# Patient Record
Sex: Female | Born: 1960 | Race: Black or African American | Hispanic: No | Marital: Single | Smoking: Current every day smoker
Health system: Southern US, Community
[De-identification: ages and names within clinical notes are randomized; demographics above are authoritative.]

## PROBLEM LIST (undated history)

## (undated) DIAGNOSIS — K635 Polyp of colon: Secondary | ICD-10-CM

## (undated) DIAGNOSIS — F32A Depression, unspecified: Secondary | ICD-10-CM

## (undated) DIAGNOSIS — R51 Headache: Secondary | ICD-10-CM

## (undated) DIAGNOSIS — N939 Abnormal uterine and vaginal bleeding, unspecified: Secondary | ICD-10-CM

## (undated) DIAGNOSIS — K226 Gastro-esophageal laceration-hemorrhage syndrome: Secondary | ICD-10-CM

## (undated) DIAGNOSIS — M1990 Unspecified osteoarthritis, unspecified site: Secondary | ICD-10-CM

## (undated) DIAGNOSIS — G894 Chronic pain syndrome: Secondary | ICD-10-CM

## (undated) DIAGNOSIS — K449 Diaphragmatic hernia without obstruction or gangrene: Secondary | ICD-10-CM

## (undated) DIAGNOSIS — K648 Other hemorrhoids: Secondary | ICD-10-CM

## (undated) DIAGNOSIS — D539 Nutritional anemia, unspecified: Secondary | ICD-10-CM

## (undated) DIAGNOSIS — K625 Hemorrhage of anus and rectum: Secondary | ICD-10-CM

## (undated) DIAGNOSIS — K5790 Diverticulosis of intestine, part unspecified, without perforation or abscess without bleeding: Secondary | ICD-10-CM

## (undated) DIAGNOSIS — E876 Hypokalemia: Secondary | ICD-10-CM

## (undated) DIAGNOSIS — F1020 Alcohol dependence, uncomplicated: Secondary | ICD-10-CM

## (undated) DIAGNOSIS — K279 Peptic ulcer, site unspecified, unspecified as acute or chronic, without hemorrhage or perforation: Secondary | ICD-10-CM

## (undated) DIAGNOSIS — K8020 Calculus of gallbladder without cholecystitis without obstruction: Secondary | ICD-10-CM

## (undated) DIAGNOSIS — R7989 Other specified abnormal findings of blood chemistry: Secondary | ICD-10-CM

## (undated) DIAGNOSIS — R945 Abnormal results of liver function studies: Secondary | ICD-10-CM

## (undated) DIAGNOSIS — F329 Major depressive disorder, single episode, unspecified: Secondary | ICD-10-CM

## (undated) HISTORY — DX: Calculus of gallbladder without cholecystitis without obstruction: K80.20

## (undated) HISTORY — PX: APPENDECTOMY: SHX54

## (undated) HISTORY — PX: OOPHORECTOMY: SHX86

## (undated) HISTORY — PX: TOTAL HIP ARTHROPLASTY: SHX124

## (undated) HISTORY — DX: Diaphragmatic hernia without obstruction or gangrene: K44.9

## (undated) HISTORY — DX: Polyp of colon: K63.5

## (undated) HISTORY — PX: SHOULDER SURGERY: SHX246

## (undated) HISTORY — DX: Gastro-esophageal laceration-hemorrhage syndrome: K22.6

## (undated) HISTORY — DX: Other specified abnormal findings of blood chemistry: R79.89

## (undated) HISTORY — DX: Chronic pain syndrome: G89.4

## (undated) HISTORY — DX: Alcohol dependence, uncomplicated: F10.20

## (undated) HISTORY — DX: Diverticulosis of intestine, part unspecified, without perforation or abscess without bleeding: K57.90

## (undated) HISTORY — DX: Peptic ulcer, site unspecified, unspecified as acute or chronic, without hemorrhage or perforation: K27.9

## (undated) HISTORY — PX: COLONOSCOPY: SHX174

## (undated) HISTORY — DX: Nutritional anemia, unspecified: D53.9

## (undated) HISTORY — DX: Other hemorrhoids: K64.8

## (undated) HISTORY — DX: Abnormal results of liver function studies: R94.5

## (undated) HISTORY — DX: Hypokalemia: E87.6

---

## 1998-01-16 ENCOUNTER — Emergency Department (HOSPITAL_COMMUNITY): Admission: EM | Admit: 1998-01-16 | Discharge: 1998-01-16 | Payer: Self-pay | Admitting: Family Medicine

## 1999-04-27 ENCOUNTER — Emergency Department (HOSPITAL_COMMUNITY): Admission: EM | Admit: 1999-04-27 | Discharge: 1999-04-27 | Payer: Self-pay | Admitting: *Deleted

## 1999-09-14 ENCOUNTER — Inpatient Hospital Stay (HOSPITAL_COMMUNITY): Admission: AD | Admit: 1999-09-14 | Discharge: 1999-09-14 | Payer: Self-pay | Admitting: Obstetrics

## 2001-04-27 ENCOUNTER — Inpatient Hospital Stay (HOSPITAL_COMMUNITY): Admission: AD | Admit: 2001-04-27 | Discharge: 2001-04-27 | Payer: Self-pay | Admitting: Obstetrics & Gynecology

## 2001-05-30 ENCOUNTER — Encounter: Admission: RE | Admit: 2001-05-30 | Discharge: 2001-05-30 | Payer: Self-pay | Admitting: Obstetrics & Gynecology

## 2002-09-14 ENCOUNTER — Encounter: Payer: Self-pay | Admitting: Emergency Medicine

## 2002-09-14 ENCOUNTER — Emergency Department (HOSPITAL_COMMUNITY): Admission: EM | Admit: 2002-09-14 | Discharge: 2002-09-14 | Payer: Self-pay | Admitting: Emergency Medicine

## 2002-10-09 ENCOUNTER — Ambulatory Visit (HOSPITAL_COMMUNITY): Admission: RE | Admit: 2002-10-09 | Discharge: 2002-10-09 | Payer: Self-pay | Admitting: Orthopedic Surgery

## 2004-08-18 ENCOUNTER — Emergency Department (HOSPITAL_COMMUNITY): Admission: EM | Admit: 2004-08-18 | Discharge: 2004-08-18 | Payer: Self-pay | Admitting: Emergency Medicine

## 2004-10-05 ENCOUNTER — Encounter: Admission: RE | Admit: 2004-10-05 | Discharge: 2004-10-05 | Payer: Self-pay | Admitting: Occupational Medicine

## 2005-08-31 ENCOUNTER — Emergency Department (HOSPITAL_COMMUNITY): Admission: EM | Admit: 2005-08-31 | Discharge: 2005-08-31 | Payer: Self-pay | Admitting: Emergency Medicine

## 2005-10-25 ENCOUNTER — Emergency Department (HOSPITAL_COMMUNITY): Admission: EM | Admit: 2005-10-25 | Discharge: 2005-10-25 | Payer: Self-pay | Admitting: Emergency Medicine

## 2005-10-27 ENCOUNTER — Ambulatory Visit (HOSPITAL_COMMUNITY): Admission: RE | Admit: 2005-10-27 | Discharge: 2005-10-27 | Payer: Self-pay | Admitting: *Deleted

## 2005-11-11 ENCOUNTER — Ambulatory Visit: Payer: Self-pay | Admitting: Family Medicine

## 2005-11-12 ENCOUNTER — Ambulatory Visit: Payer: Self-pay | Admitting: *Deleted

## 2005-12-07 ENCOUNTER — Encounter: Admission: RE | Admit: 2005-12-07 | Discharge: 2006-01-27 | Payer: Self-pay | Admitting: Orthopedic Surgery

## 2007-08-07 ENCOUNTER — Emergency Department (HOSPITAL_COMMUNITY): Admission: EM | Admit: 2007-08-07 | Discharge: 2007-08-07 | Payer: Self-pay | Admitting: Emergency Medicine

## 2007-10-12 ENCOUNTER — Encounter (INDEPENDENT_AMBULATORY_CARE_PROVIDER_SITE_OTHER): Payer: Self-pay | Admitting: Gynecology

## 2007-10-12 ENCOUNTER — Ambulatory Visit: Payer: Self-pay | Admitting: Gynecology

## 2007-10-20 ENCOUNTER — Ambulatory Visit (HOSPITAL_COMMUNITY): Admission: RE | Admit: 2007-10-20 | Discharge: 2007-10-20 | Payer: Self-pay | Admitting: Obstetrics & Gynecology

## 2007-11-02 ENCOUNTER — Ambulatory Visit: Payer: Self-pay | Admitting: Obstetrics and Gynecology

## 2008-02-13 ENCOUNTER — Ambulatory Visit (HOSPITAL_COMMUNITY): Admission: RE | Admit: 2008-02-13 | Discharge: 2008-02-13 | Payer: Self-pay | Admitting: Family Medicine

## 2008-04-12 ENCOUNTER — Ambulatory Visit (HOSPITAL_COMMUNITY): Admission: RE | Admit: 2008-04-12 | Discharge: 2008-04-12 | Payer: Self-pay | Admitting: Orthopaedic Surgery

## 2008-09-09 ENCOUNTER — Encounter: Admission: RE | Admit: 2008-09-09 | Discharge: 2008-10-17 | Payer: Self-pay | Admitting: Orthopaedic Surgery

## 2008-12-25 ENCOUNTER — Encounter: Admission: RE | Admit: 2008-12-25 | Discharge: 2009-02-12 | Payer: Self-pay | Admitting: Orthopaedic Surgery

## 2009-03-05 ENCOUNTER — Encounter: Admission: RE | Admit: 2009-03-05 | Discharge: 2009-06-03 | Payer: Self-pay | Admitting: Orthopaedic Surgery

## 2009-03-12 ENCOUNTER — Ambulatory Visit (HOSPITAL_COMMUNITY): Admission: RE | Admit: 2009-03-12 | Discharge: 2009-03-12 | Payer: Self-pay | Admitting: Obstetrics & Gynecology

## 2009-05-08 ENCOUNTER — Encounter: Admission: RE | Admit: 2009-05-08 | Discharge: 2009-05-09 | Payer: Self-pay | Admitting: Physician Assistant

## 2009-11-27 ENCOUNTER — Encounter: Admission: RE | Admit: 2009-11-27 | Discharge: 2009-11-27 | Payer: Self-pay | Admitting: Orthopaedic Surgery

## 2010-03-16 ENCOUNTER — Ambulatory Visit (HOSPITAL_COMMUNITY): Admission: RE | Admit: 2010-03-16 | Discharge: 2010-03-16 | Payer: Self-pay | Admitting: Obstetrics & Gynecology

## 2010-12-09 ENCOUNTER — Emergency Department (HOSPITAL_COMMUNITY)
Admission: EM | Admit: 2010-12-09 | Discharge: 2010-12-09 | Disposition: A | Discharge status: Home / Self Care | Payer: Self-pay | Attending: Emergency Medicine | Admitting: Emergency Medicine

## 2010-12-09 ENCOUNTER — Emergency Department (HOSPITAL_COMMUNITY): Payer: Self-pay

## 2010-12-09 DIAGNOSIS — M25519 Pain in unspecified shoulder: Secondary | ICD-10-CM | POA: Insufficient documentation

## 2010-12-09 DIAGNOSIS — W208XXA Other cause of strike by thrown, projected or falling object, initial encounter: Secondary | ICD-10-CM | POA: Insufficient documentation

## 2010-12-09 DIAGNOSIS — S60229A Contusion of unspecified hand, initial encounter: Secondary | ICD-10-CM | POA: Insufficient documentation

## 2010-12-09 DIAGNOSIS — Y93G2 Activity, grilling and smoking food: Secondary | ICD-10-CM | POA: Insufficient documentation

## 2010-12-09 DIAGNOSIS — M25539 Pain in unspecified wrist: Secondary | ICD-10-CM | POA: Insufficient documentation

## 2010-12-15 NOTE — Op Note (Signed)
NAMEWANITA, Jaime Phillips             ACCOUNT NO.:  [id number]   MEDICAL RECORD NO.:  [id number]          PATIENT TYPE:  AMB   LOCATION:  SDS                          FACILITY:  [facility]   PHYSICIAN:  Jaime Phillips. Magnus Ivan, M.D.DATE OF BIRTH:  05-Sep-1960   DATE OF PROCEDURE:  04/12/2008  DATE OF DISCHARGE:  04/12/2008                               OPERATIVE REPORT   PREOPERATIVE DIAGNOSES:  1. Left shoulder impingement syndrome.  2. 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   POSTOPERATIVE DIAGNOSES:  1. Left shoulder impingement syndrome.  2. 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   PROCEDURES:  1. Left shoulder arthroscopy with debridement.  2. Left shoulder arthroscopy with subacromial decompression.  3. Left shoulder open distal clavicle resection.   SURGEON:  Jaime Phillips. Magnus Ivan, MD   ANESTHESIA:  General.   BLOOD LOSS:  150 mL.   COMPLICATIONS:  None.   INDICATIONS:  Briefly, [name] is a 50 year old who sustained a  lateral clavicle fracture back in January of this year.  This was  unstable fracture and I recommend she undergo open reduction and  internal fixation.  The risks and benefits were explained to her in  length and she wished to proceed with nonoperative treatment.  It is  September now and she has had exquisite pain in her [facility] joint area and  prominence of the [facility] joint in the distal clavicle.  She wished to have  this area excised now.  I talked to her about the possibility of  reconstructing the coracoclavicular ligaments if need be depending on  the findings intraoperatively.   After the risks and benefits were understood, she agreed to proceed with  surgery.   PROCEDURE DESCRIPTION:  After informed consent was obtained, the  appropriate left shoulder was marked.  She was brought to  the operating  room and placed on the operating table.  General anesthesia was  obtained.  She was then fashioned to a beach-chair position with  appropriate positioning of the head, neck, and appropriate positioning  of the down nonoperative right arm.  There was forward bending of the  waist and knees.  Her left shoulder was prepped an draped with DuraPrep  and sterile drapes including sterile stockinette was placed in-line  skeletal traction with 45 degrees of forward flexion using the shoulder  traction device.  Time-out was called and she was identified as the  correct patient and correct extremity.  I then made a posterior portal  of the posterior and lateral edge of acromion and the glenohumeral joint  was encountered.  You could see that she had basically normal anatomy in  the glenohumeral joint itself with the rotator cuff, the biceps tendon,  and labrum all easily visible and had no changes whatsoever.  I then  proceeded with the subacromial decompression through the posterior  portal, I went into the subacromial space, I made a several lateral  portal, we could see there was significant tendinosis and bursitis  throughout the subacromial space.  We could see where the previous  lateral or distal clavicle fracture healed, but there was certainly much  near the [facility] joint.  Once I performed a complete bursectomy within the  subacromial space including the rotator cuff, I proceed with open  portion of distal clavicle resection.  The scope was removed from the  shoulder and then I made an incision directly over the lateral clavicle  that centered over the [facility] joint.  I then cleaned the soft tissue debris.  Using oscillating saw, I was able to excise it at least a centimeter of  the distal clavicle.  I then contoured the ends of this and there was no  sharp edge of bone and put the shoulder to motion and the CC ligaments  were intact and the clavicle did not warrant any further  stabilization.  I copiously irrigated the tissue then and closed the deep tissue and  oversewed this with 0 Vicryl suture followed by 2-0 Vicryl, subcutaneous  tissue, and interrupted 3-0 nylon on all skin incisions.  I then  infiltrated the incisions with 0.25% Sensorcaine with epinephrine and 4  mg of morphine.  The Xeroform followed by well-padded sterile dressing  was applied and the patient's arm was placed in shoulder immobilizer  sling.  She was awakened, extubated, and taken to recovery room in  stable condition.  All final counts were correct.  No complications  noted.      Jaime Phillips. Magnus Ivan, M.D.  Electronically Signed     CYB/MEDQ  D:  04/12/2008  T:  04/13/2008  Job:  [id number]

## 2010-12-15 NOTE — Group Therapy Note (Signed)
NAME:  Jaime Phillips, Jaime Phillips NO.:  [id number]   MEDICAL RECORD NO.:  [id number]          PATIENT TYPE:  WOC   LOCATION:  [facility]                   FACILITY:  [facility]   PHYSICIAN:  Ginger Carne, MD DATE OF BIRTH:  [birth year]   DATE OF SERVICE:                                  CLINIC NOTE   This patient is a 50 year old African American female referred by the  [facility] Department because of a 2-year history of  dysmenorrhea on the right lower quadrant.  She has been on Depo- Provera  150 mg q. 3 months for the past 2 years and every other month has 4 to 5  days of light-to-moderate flow accompanied by right lower quadrant pain  which radiates down her anterior thigh.  The patient indicates that in  the past she has had an appendectomy and an oophorectomy but is  uncertain which side.  She is also unclear as to the indications.  She  also does not know which year this was performed.  The patient has no  genitourinary sources for her discomfort.  Her gastrointestinal review  of systems is positive for blood in the stool, which she has had for  quite awhile.  She states her father has colon cancer.  The remainder of  her history is noted in the chart.   SALIENT PHYSICAL FINDINGS:  ABDOMEN:  Soft without gross  hepatosplenomegaly.  There is no tenderness on abdominal or pelvic  palpation.  PELVIC:  External genitalia, vulva and vagina normal.  Cervix smooth  without erosions or lesions.  Pap smear performed.  GC and chlamydia  obtained.  Uterus is small, anteverted and flexed.  Both adnexa are  palpable with slight tenderness in the right lower quadrant.  There is  no evidence of femoral direct or indirect inguinal hernia on the right  or left sides.   IMPRESSION:  1. Dysmenorrhea.  2. Blood in the stool.   PLAN:  I have ordered a pelvic sonogram to further delineate her pelvic  anatomy and any pathology that can be noted.  I have also referred her  to gastroenterology, where she will require a colonoscopy because of her  family history and blood in the stool.  I deferred a rectal exam as  this will be managed by the GI department.  I have also ordered a CBC  and a CEA as well.  She will return in 2 weeks following her ultrasound  results.  The patient is apprised of said care plan.           ______________________________  Ginger Carne, MD     SHB/MEDQ  D:  10/12/2007  T:  10/13/2007  Job:  [id number]

## 2010-12-15 NOTE — Group Therapy Note (Signed)
Jaime Phillips, STERNE NO.:  [id number]   MEDICAL RECORD NO.:  [id number]          PATIENT TYPE:  WOC   LOCATION:  [facility]                   FACILITY:  [facility]   PHYSICIAN:  Ginger Carne, MD DATE OF BIRTH:  Oct 11, 1960   DATE OF SERVICE:  11/02/2007                                  CLINIC NOTE   The patient returns today for follow-up results pertaining to her  ultrasound and a CBC. She is a 50 year old African American female who  has had a 2-year history of dysmenorrhea, dyspareunia and chronic pelvic  pain.  She has been on Depo Provera continuously 150 mg every 3 months.  She denies GI or GU sources or symptoms for her pain.  Based on her  ultrasound appears that she has had a left salpingo-oophorectomy and she  states by history she has had an appendectomy as well.  Remainder of her  pelvic ultrasound findings are within normal limits.  Specifically her  uterus appears normal and the right ovary appears normal.  Her CBC  demonstrates a hemoglobin of 14.3 and hematocrit of 44.1. Of concern is  the patient has bloody stool.   I have asked the patient to see a gastroenterologist for evaluation and  probable colonoscopy. She is agreeable to this and I think that from a  prioritization standpoint this needs to be taken care of first.  Afterwards I asked her husband and the patient to return to revisit  concerns pertaining to evaluation of her pelvic discomfort. I think that  she would be a candidate for an operative laparoscopy.  However, this  discussion will follow after she has been evaluated by gastroenterology.  They are satisfied with this approach.           ______________________________  Ginger Carne, MD     SHB/MEDQ  D:  11/02/2007  T:  11/02/2007  Job:  [id number]

## 2011-04-09 ENCOUNTER — Emergency Department (HOSPITAL_COMMUNITY)
Admission: EM | Admit: 2011-04-09 | Discharge: 2011-04-09 | Disposition: A | Discharge status: Home / Self Care | Payer: Self-pay | Attending: Emergency Medicine | Admitting: Emergency Medicine

## 2011-04-09 ENCOUNTER — Emergency Department (HOSPITAL_COMMUNITY): Payer: Self-pay

## 2011-04-09 DIAGNOSIS — W1789XA Other fall from one level to another, initial encounter: Secondary | ICD-10-CM | POA: Insufficient documentation

## 2011-04-09 DIAGNOSIS — Y92009 Unspecified place in unspecified non-institutional (private) residence as the place of occurrence of the external cause: Secondary | ICD-10-CM | POA: Insufficient documentation

## 2011-04-09 DIAGNOSIS — R079 Chest pain, unspecified: Secondary | ICD-10-CM | POA: Insufficient documentation

## 2011-04-09 DIAGNOSIS — F17200 Nicotine dependence, unspecified, uncomplicated: Secondary | ICD-10-CM | POA: Insufficient documentation

## 2011-04-09 DIAGNOSIS — S20219A Contusion of unspecified front wall of thorax, initial encounter: Secondary | ICD-10-CM | POA: Insufficient documentation

## 2011-05-05 LAB — CBC
HCT: 38.6
MCHC: 34
MCV: 103.7 — ABNORMAL HIGH
Platelets: 276

## 2011-06-04 ENCOUNTER — Other Ambulatory Visit (HOSPITAL_COMMUNITY): Payer: Self-pay | Admitting: Physician Assistant

## 2011-06-04 DIAGNOSIS — Z1231 Encounter for screening mammogram for malignant neoplasm of breast: Secondary | ICD-10-CM

## 2011-07-02 ENCOUNTER — Ambulatory Visit (HOSPITAL_COMMUNITY)
Admission: RE | Admit: 2011-07-02 | Discharge: 2011-07-02 | Disposition: A | Discharge status: Home / Self Care | Payer: Self-pay | Source: Ambulatory Visit | Attending: Physician Assistant | Admitting: Physician Assistant

## 2011-07-02 DIAGNOSIS — Z1231 Encounter for screening mammogram for malignant neoplasm of breast: Secondary | ICD-10-CM

## 2012-04-09 ENCOUNTER — Emergency Department (HOSPITAL_COMMUNITY)
Admission: EM | Admit: 2012-04-09 | Discharge: 2012-04-09 | Disposition: A | Discharge status: Home / Self Care | Payer: Self-pay | Attending: Emergency Medicine | Admitting: Emergency Medicine

## 2012-04-09 ENCOUNTER — Emergency Department (HOSPITAL_COMMUNITY): Payer: Self-pay

## 2012-04-09 ENCOUNTER — Encounter (HOSPITAL_COMMUNITY): Payer: Self-pay | Admitting: Emergency Medicine

## 2012-04-09 DIAGNOSIS — S8990XA Unspecified injury of unspecified lower leg, initial encounter: Secondary | ICD-10-CM | POA: Insufficient documentation

## 2012-04-09 DIAGNOSIS — S99922A Unspecified injury of left foot, initial encounter: Secondary | ICD-10-CM

## 2012-04-09 DIAGNOSIS — X500XXA Overexertion from strenuous movement or load, initial encounter: Secondary | ICD-10-CM | POA: Insufficient documentation

## 2012-04-09 DIAGNOSIS — Y93H9 Activity, other involving exterior property and land maintenance, building and construction: Secondary | ICD-10-CM | POA: Insufficient documentation

## 2012-04-09 DIAGNOSIS — Y92009 Unspecified place in unspecified non-institutional (private) residence as the place of occurrence of the external cause: Secondary | ICD-10-CM | POA: Insufficient documentation

## 2012-04-09 MED ORDER — OXYCODONE-ACETAMINOPHEN 5-325 MG PO TABS: 1.0000 | ORAL_TABLET | Freq: Four times a day (QID) | ORAL | Status: AC | PRN

## 2012-04-09 MED ORDER — OXYCODONE-ACETAMINOPHEN 5-325 MG PO TABS
2.0000 | ORAL_TABLET | Freq: Once | ORAL | Status: DC
  Filled 2012-04-09: qty 2

## 2012-04-09 NOTE — ED Notes (Signed)
Pt states that yesterday she fell and hurt or twisted her rt ankle, has pain and minimal swelling to foot, minimal wt bearing to it strong pedal pulses.

## 2012-04-09 NOTE — ED Provider Notes (Signed)
History     CSN: [id number]  Arrival date & time 04/09/12  1322   First MD Initiated Contact with Patient 04/09/12 1543      Chief Complaint  Patient presents with  . Foot Pain    (Consider location/radiation/quality/duration/timing/severity/associated sxs/prior treatment) HPI Comments: Jaime Phillips 51 y.o. female   The chief complaint is: Patient presents with:   Foot Pain   The patient has medical history significant for:   History reviewed. No pertinent past medical history.  Patient presents with a left foot injury that occurred yesterday while cutting the grass. She states she twisted her ankle causing 9/10 pain. Patient states that she has swelling of her left foot and toes. Denies fever or chills. Patient states she has taken tylenol every 6 hours without relief. Denies NVD or abdominal pain. Denies numbness. Reports some difficulty ambulating. History is significant for fracturing the left ankle in 2000.     The history is provided by the patient.    History reviewed. No pertinent past medical history.  History reviewed. No pertinent past surgical history.  No family history on file.  History  Substance Use Topics  . Smoking status: Not on file  . Smokeless tobacco: Not on file  . Alcohol Use: Not on file    OB History    Grav Para Term Preterm Abortions TAB SAB Ect Mult Living                  Review of Systems  Constitutional: Negative for fever and chills.  Gastrointestinal: Negative for nausea, vomiting, abdominal pain and diarrhea.  Musculoskeletal: Positive for gait problem.  Neurological: Negative for numbness.  All other systems reviewed and are negative.    Allergies  Review of patient's allergies indicates not on file.  Home Medications  No current outpatient prescriptions on file.  BP 125/78  Pulse 88  Temp 97.8 F (36.6 C) (Oral)  SpO2 100%  Physical Exam  Nursing note and vitals reviewed. Constitutional: She appears  well-developed and well-nourished. She appears distressed.  HENT:  Head: Normocephalic and atraumatic.  Eyes: Conjunctivae and EOM are normal. No scleral icterus.  Neck: Normal range of motion. Neck supple.  Cardiovascular: Normal rate and regular rhythm.   Pulmonary/Chest: Effort normal and breath sounds normal.  Abdominal: Soft. Bowel sounds are normal. There is no tenderness.  Musculoskeletal:       Left foot: She exhibits tenderness, bony tenderness and swelling. She exhibits normal range of motion and normal capillary refill.       Feet:  Neurological: She is alert.  Skin: Skin is warm.    ED Course  Procedures (including critical care time)  Labs Reviewed - No data to display Dg Foot Complete Left  04/09/2012  *RADIOLOGY REPORT*  Clinical Data: Foot pain.  Pain third through fifth MTP joints.  LEFT FOOT - COMPLETE 3+ VIEW  Comparison: None.  Findings: The joints of the foot are aligned.  No acute or healing fracture is identified. There is slight soft tissue swelling of the dorsum of the forefoot.  Plantar calcaneal spur.  IMPRESSION:  1.  No acute bony abnormality visualized. 2.  Mild soft tissue swelling over the dorsum of the proximal foot.   Original Report Authenticated By: Britta Mccreedy, M.D.      1. Injury of left foot       MDM  Patient presents s/p left foot/ankle injury that occurred yesterday. Patient given pain medication with improvement. Imaging: Remarkable for soft tissue  swelling but no fracture.  Patient placed in aso splint with recommendations for rest and ice. Patient discharged on pain medication. No red flags for fracture, subluxation or dislocation. Return precautions given.        Pixie Casino, PA-C 04/10/12 0209

## 2012-04-09 NOTE — Discharge Instructions (Signed)
If you notice increased swelling, redness of your foot, or develop a fever, return to the ER Take medication as written Look at instructions about rest and ice

## 2012-04-10 NOTE — ED Provider Notes (Signed)
Medical screening examination/treatment/procedure(s) were performed by non-physician practitioner and as supervising physician I was immediately available for consultation/collaboration.   Glynn Octave, MD 04/10/12 [phone]

## 2012-07-19 ENCOUNTER — Inpatient Hospital Stay (HOSPITAL_COMMUNITY)
Admission: AD | Admit: 2012-07-19 | Discharge: 2012-07-19 | Disposition: A | Discharge status: Home / Self Care | Payer: Self-pay | Source: Ambulatory Visit | Attending: Obstetrics & Gynecology | Admitting: Obstetrics & Gynecology

## 2012-07-19 ENCOUNTER — Encounter (HOSPITAL_COMMUNITY): Payer: Self-pay

## 2012-07-19 DIAGNOSIS — K625 Hemorrhage of anus and rectum: Secondary | ICD-10-CM | POA: Insufficient documentation

## 2012-07-19 DIAGNOSIS — M79609 Pain in unspecified limb: Secondary | ICD-10-CM | POA: Insufficient documentation

## 2012-07-19 DIAGNOSIS — K089 Disorder of teeth and supporting structures, unspecified: Secondary | ICD-10-CM | POA: Insufficient documentation

## 2012-07-19 HISTORY — DX: Abnormal uterine and vaginal bleeding, unspecified: N93.9

## 2012-07-19 LAB — COMPREHENSIVE METABOLIC PANEL
ALT: 16 U/L (ref 0–35)
AST: 21 U/L (ref 0–37)
Alkaline Phosphatase: 60 U/L (ref 39–117)
CO2: 24 mEq/L (ref 19–32)
Calcium: 9 mg/dL (ref 8.4–10.5)
GFR calc Af Amer: >90 mL/min (ref >90)
GFR calc non Af Amer: 79 mL/min — ABNORMAL LOW (ref >90)
Glucose, Bld: 105 mg/dL — ABNORMAL HIGH (ref 70–99)
Potassium: 4.1 mEq/L (ref 3.5–5.1)
Sodium: 136 mEq/L (ref 135–145)
Total Protein: 6.7 g/dL (ref 6.0–8.3)

## 2012-07-19 LAB — CBC
Hemoglobin: 11.9 g/dL — ABNORMAL LOW (ref 12.0–15.0)
Platelets: 269 10*3/uL (ref 150–400)
RBC: 3.46 MIL/uL — ABNORMAL LOW (ref 3.87–5.11)

## 2012-07-19 MED ORDER — OMEPRAZOLE 20 MG PO CPDR: 20.0000 mg | DELAYED_RELEASE_CAPSULE | Freq: Every day | ORAL | Status: DC

## 2012-07-19 MED ORDER — HYDROCODONE-ACETAMINOPHEN 5-325 MG PO TABS: 1.0000 | ORAL_TABLET | Freq: Four times a day (QID) | ORAL | Status: DC | PRN

## 2012-07-19 MED ORDER — OXYCODONE-ACETAMINOPHEN 5-325 MG PO TABS: 1.0000 | ORAL_TABLET | Freq: Once | ORAL | Status: DC

## 2012-07-19 MED ORDER — FAMOTIDINE 20 MG PO TABS
ORAL_TABLET | ORAL | Status: AC
  Filled 2012-07-19: qty 1

## 2012-07-19 MED ORDER — FAMOTIDINE 20 MG PO TABS
20.0000 mg | ORAL_TABLET | Freq: Once | ORAL | Status: AC
  Administered 2012-07-19: 20 mg via ORAL

## 2012-07-19 NOTE — MAU Note (Signed)
Patient is in with c/o rectal bleeding for 2 weeks ago, that have small amount of clots today. Patient also c/o bilateral leg pain and toothache. Patient is taking multi NSAIDS Q3hours, aspirin and tylenol. She denies feeling dizzy.

## 2012-07-19 NOTE — MAU Provider Note (Signed)
History     CSN: [id number]  Arrival date and time: 07/19/12 1605   First Provider Initiated Contact with Patient 07/19/12 1646      Chief Complaint  Patient presents with  . Rectal Bleeding  . Leg Pain  . Dental Pain   HPI  Jaime Phillips is a 51 y.o. female who presents to MAU with a complaint of rectal bleeding, leg pain and dental pain.   The patient's leg pain started about 2-3 months ago and comes and goes throughout the day. She states that it is more severe when she is laying down and keeps her from sleeping. The pain starts in her lower back and radiates down her legs bilaterally, although not always both legs at the same time. The patient has been taking tylenol, motrin, Advil and tylenol PM for her pain.   The patient's rectal bleeding started approximately 2 weeks ago. She came in today because she is noticing clots in her stool. She has issues with constipation often. The blood in her stool is mostly dark red. She states her last colonoscopy was about 6 years ago. After further discussion the patient states that she was diagnosed with diverticulitis at that time and has seen numerous GI specialists in the past.   The patient was seen at [facility] yesterday for her Depo provera. The patient states that she had issues with vaginal bleeding but since starting the depo she has only had spotting. The depo was given "in her stomach" and she has redness at the injection site. She states that they used to give it to her in her hip and for the last 2 injections they have given it to her in her abdomen.    OB History    Grav Para Term Preterm Abortions TAB SAB Ect Mult Living                  Past Medical History  Diagnosis Date  . Abnormal uterine bleeding     Past Surgical History  Procedure Date  . Appendectomy   . Oophorectomy   . Shoulder surgery     left shoulder    History reviewed. No pertinent family history.  History  Substance Use Topics  . Smoking status:  Current Every Day Smoker    Types: Cigarettes  . Smokeless tobacco: Not on file  . Alcohol Use: 1.2 oz/week    2 Shots of liquor per week    Allergies:  Allergies  Allergen Reactions  . Codeine Other (See Comments)    hallucinating    No prescriptions prior to admission    ROS  Negative unless otherwise stated in HPI  Physical Exam   Blood pressure 118/86, pulse 94, temperature 98.6 F (37 C), temperature source Oral, resp. rate 18, SpO2 100.00%.  Physical Exam  Constitutional: She is oriented to person, place, and time. She appears well-developed and well-nourished. No distress.  HENT:  Head: Normocephalic and atraumatic.  Cardiovascular: Normal rate.   Respiratory: Effort normal.  GI: Soft. Bowel sounds are normal. She exhibits no distension and no mass. There is tenderness (Mild tenderness of the LLQ with palpation). There is no rebound and no guarding.  Genitourinary: Rectal exam shows no external hemorrhoid, no internal hemorrhoid, no fissure, no mass, no tenderness and anal tone normal. Guaiac positive stool.  Neurological: She is alert and oriented to person, place, and time.  Skin: Skin is warm and dry. No erythema.  Psychiatric: She has a normal mood and affect.  MAU Course  Procedures  MDM Spoke with Dr. Despina Hidden. Patient is hemodynamically stable at this time.  GI referral outpatient Rx for Prilosec #30 Rx Lortab #10   Assessment and Plan  A: Rectal bleeding; most likely lower GI source Leg pain Tooth pain  P: Discharge patient Outpatient GI referral sent. Patient instructed to call office to schedule if she has not been notified of appointment in the next 2-3 days Rx for Prilosec sent to pharmacy; Rx for Lortab given Patient instructed to stop taking all other pain medication until evaluated by the GI MD Patient instructed to limit alcohol consumption as this could be contributing to her symptoms Patient instructed to find a PCP and dentist to  evaluate her other pain Patient instructed to go to the ED if her condition were to change or worsen.  Patient voices understanding of recommendations and has no further questions or concerns at this time.  ETHIER, JULIE N. 07/19/2012, 7:41 PM

## 2012-07-27 ENCOUNTER — Other Ambulatory Visit (HOSPITAL_COMMUNITY): Payer: Self-pay | Admitting: Physician Assistant

## 2012-07-27 ENCOUNTER — Telehealth: Payer: Self-pay | Admitting: *Deleted

## 2012-07-27 DIAGNOSIS — Z1231 Encounter for screening mammogram for malignant neoplasm of breast: Secondary | ICD-10-CM

## 2012-07-27 NOTE — Telephone Encounter (Signed)
Pt reports rectal bleeding for 2-3 weeks. She went to the ER on 07/19/12 for rectal blleding and leg and teeth pain. She was told to f/u with GI in a week since hemodynamically stable.  Pt reports she has seen GI docs in the past, knows it's [facility], but does not know the doc there. She called here to see where she could get the earliest appt. She states she's wearing a pad d/t the clots after a BM, but states she bleeds in between the BMs. I called [facility] and she was last seen by Dr Evette Cristal in May 2009 and is inactive d/t an outstanding balance. Informed pt she will need to bring her records by here for md review for her 08/17/11 appt. If she needs to be seen sooner, she must bring the records by here. Pt stated understanding.

## 2012-07-28 ENCOUNTER — Encounter (HOSPITAL_COMMUNITY): Payer: Self-pay | Admitting: Emergency Medicine

## 2012-07-28 ENCOUNTER — Emergency Department (HOSPITAL_COMMUNITY)
Admission: EM | Admit: 2012-07-28 | Discharge: 2012-07-28 | Disposition: A | Discharge status: Home / Self Care | Payer: Self-pay | Attending: Emergency Medicine | Admitting: Emergency Medicine

## 2012-07-28 ENCOUNTER — Emergency Department (HOSPITAL_COMMUNITY): Payer: Self-pay

## 2012-07-28 DIAGNOSIS — K625 Hemorrhage of anus and rectum: Secondary | ICD-10-CM | POA: Insufficient documentation

## 2012-07-28 DIAGNOSIS — M549 Dorsalgia, unspecified: Secondary | ICD-10-CM | POA: Insufficient documentation

## 2012-07-28 DIAGNOSIS — R1032 Left lower quadrant pain: Secondary | ICD-10-CM | POA: Insufficient documentation

## 2012-07-28 DIAGNOSIS — R109 Unspecified abdominal pain: Secondary | ICD-10-CM

## 2012-07-28 DIAGNOSIS — E872 Acidosis: Secondary | ICD-10-CM

## 2012-07-28 DIAGNOSIS — Z79899 Other long term (current) drug therapy: Secondary | ICD-10-CM | POA: Insufficient documentation

## 2012-07-28 DIAGNOSIS — F17200 Nicotine dependence, unspecified, uncomplicated: Secondary | ICD-10-CM | POA: Insufficient documentation

## 2012-07-28 LAB — BASIC METABOLIC PANEL
BUN: 8 mg/dL (ref 6–23)
Calcium: 9 mg/dL (ref 8.4–10.5)
GFR calc Af Amer: >90 mL/min (ref >90)
GFR calc non Af Amer: >90 mL/min (ref >90)
Glucose, Bld: 106 mg/dL — ABNORMAL HIGH (ref 70–99)
Potassium: 3.8 mEq/L (ref 3.5–5.1)

## 2012-07-28 LAB — CBC
HCT: 32.9 % — ABNORMAL LOW (ref 36.0–46.0)
Hemoglobin: 11.3 g/dL — ABNORMAL LOW (ref 12.0–15.0)
MCH: 35.5 pg — ABNORMAL HIGH (ref 26.0–34.0)
MCHC: 34.3 g/dL (ref 30.0–36.0)
RDW: 12.5 % (ref 11.5–15.5)

## 2012-07-28 MED ORDER — OXYCODONE-ACETAMINOPHEN 5-325 MG PO TABS: 1.0000 | ORAL_TABLET | ORAL | Status: DC | PRN

## 2012-07-28 MED ORDER — SODIUM CHLORIDE 0.9 % IV SOLN
1000.0000 mL | INTRAVENOUS | Status: DC
  Administered 2012-07-28: 1000 mL via INTRAVENOUS

## 2012-07-28 MED ORDER — MORPHINE SULFATE 4 MG/ML IJ SOLN
6.0000 mg | Freq: Once | INTRAMUSCULAR | Status: AC
  Administered 2012-07-28: 6 mg via INTRAVENOUS
  Filled 2012-07-28: qty 2

## 2012-07-28 MED ORDER — IOHEXOL 300 MG/ML  SOLN
100.0000 mL | Freq: Once | INTRAMUSCULAR | Status: AC | PRN
  Administered 2012-07-28: 100 mL via INTRAVENOUS

## 2012-07-28 MED ORDER — SODIUM CHLORIDE 0.9 % IV SOLN
1000.0000 mL | Freq: Once | INTRAVENOUS | Status: AC
  Administered 2012-07-28: 1000 mL via INTRAVENOUS

## 2012-07-28 NOTE — ED Notes (Signed)
Off floor for testing 

## 2012-07-28 NOTE — ED Provider Notes (Signed)
History     CSN: [id number]  Arrival date & time 07/28/12  1237   First MD Initiated Contact with Patient 07/28/12 1329      Chief Complaint  Patient presents with  . Rectal Bleeding  . Back Pain     The history is provided by the patient.   patient reports worsening left lower quadrant abdominal pain over the past 6-7 days with associated rectal bleeding.  She reports she is rectal bleeding every time she has a bowel movement.  She has no rectal bleeding in between bowel movements.  She does have some pain with defecation.  No fevers or chills.  She does have a history of diverticulosis and diverticulitis.  She has followup with a gastroenterologist but is not until mid January and she states that her symptoms continue and it is bothering her.  Her symptoms are mild to moderate in severity.  Nothing worsens or improves her symptoms.  No lightheadedness or weakness.  No chest pain or shortness of breath.  Past Medical History  Diagnosis Date  . Abnormal uterine bleeding     Past Surgical History  Procedure Date  . Appendectomy   . Oophorectomy   . Shoulder surgery     left shoulder    No family history on file.  History  Substance Use Topics  . Smoking status: Current Every Day Smoker    Types: Cigarettes  . Smokeless tobacco: Not on file  . Alcohol Use: 1.2 oz/week    2 Shots of liquor per week    OB History    Grav Para Term Preterm Abortions TAB SAB Ect Mult Living                  Review of Systems  Gastrointestinal: Positive for hematochezia.  Musculoskeletal: Positive for back pain.  All other systems reviewed and are negative.    Allergies  Codeine  Home Medications   Current Outpatient Rx  Name  Route  Sig  Dispense  Refill  . ACETAMINOPHEN 500 MG PO TABS   Oral   Take 500 mg by mouth every 6 (six) hours as needed. For pain.         Drema Balzarine HCL 200-25 MG PO CAPS   Oral   Take 2 tablets by mouth every 8 (eight) hours as  needed. For pain.         Marland Kitchen OMEPRAZOLE 20 MG PO CPDR   Oral   Take 1 capsule (20 mg total) by mouth daily.   30 capsule   0     BP 116/80  Pulse 82  Temp 98.3 F (36.8 C) (Oral)  Resp 18  SpO2 100%  Physical Exam  Nursing note and vitals reviewed. Constitutional: She is oriented to person, place, and time. She appears well-developed and well-nourished. No distress.  HENT:  Head: Normocephalic and atraumatic.  Eyes: EOM are normal.  Neck: Normal range of motion.  Cardiovascular: Normal rate, regular rhythm and normal heart sounds.   Pulmonary/Chest: Effort normal and breath sounds normal.  Abdominal: Soft. She exhibits no distension.       Mild tenderness in left lower quadrant  Musculoskeletal: Normal range of motion.  Neurological: She is alert and oriented to person, place, and time.  Skin: Skin is warm and dry.  Psychiatric: She has a normal mood and affect. Judgment normal.    ED Course  Procedures (including critical care time)  Labs Reviewed  CBC - Abnormal; Notable for the following:  RBC 3.18 (*)     Hemoglobin 11.3 (*)     HCT 32.9 (*)     MCV 103.5 (*)     MCH 35.5 (*)     All other components within normal limits  BASIC METABOLIC PANEL - Abnormal; Notable for the following:    Glucose, Bld 106 (*)     All other components within normal limits  TYPE AND SCREEN  ABO/RH   Ct Abdomen Pelvis W Contrast  07/28/2012  *RADIOLOGY REPORT*  Clinical Data:   Rectal bleeding  CT ABDOMEN AND PELVIS WITH CONTRAST  Technique:  Multidetector CT imaging of the abdomen and pelvis was performed following the standard protocol during bolus administration of intravenous contrast.  Contrast: OMNIPAQUE IOHEXOL 300 MG/ML  SOLN  Comparison: None.  Findings: Images which include the lung bases are unremarkable.  The liver, spleen, stomach, duodenum, pancreas, and gallbladder are unremarkable.  No evidence for adrenal mass.  Kidneys have normal imaging features.  Mild  circumferential wall thickening is noted in the distal esophagus (see image seven of series 2).  This is probably related to reflux esophagitis, but neoplasm could have this appearance.  No abdominal aortic aneurysm.  No free fluid or lymphadenopathy in the abdomen.  The abdominal bowel loops have normal imaging features.  Images through the pelvis show no intraperitoneal free fluid. There is no pelvic sidewall lymphadenopathy.  Bladder is unremarkable.  Uterus is normal.  No adnexal mass.  Diverticular disease is seen in the left colon without evidence for diverticulitis the terminal ileum is normal. Nonvisualization of the appendix is consistent with the reported history of appendectomy.  2 x 2 x 3 cm calcification in the proximal left iliopsoas complex, anterior to the hip, may be related to chronic bursal inflammation. This could also be related to an old hematoma or muscular injury. Bone windows reveal no worrisome lytic or sclerotic osseous lesions.  IMPRESSION: No acute findings in the abdomen or pelvis.  Specifically, no changes to explain the patient's reported history of rectal bleeding.  Diverticular disease noted in the left colon without diverticulitis.  Colonoscopy may be indicated and correlation with colorectal cancer screening history recommended.   Original Report Authenticated By: Kennith Center, M.D.    I personally reviewed the imaging tests through PACS system I reviewed available ER/hospitalization records through the EMR    1. Rectal bleeding   2. Abdominal pain       MDM  5:10 PM The patient feels much better at this time.  She has a very small amount of blood on gloved rectal exam.  She has no evidence of external hemorrhoids.  I suspect her bleeding given that it only occurs with bowel movements is likely a small internal hemorrhoid.  No obvious palpable hemorrhoid noted on exam.  CT scan without acute pathology..  She does have a history of diverticulitis and diverticulosis.   Her bleeding has been going on for 2-3 weeks and her hemoglobin and vital signs are stable.  This does not appear to be a significant GI bleed.  She are ready has followup appointment with gastroenterology.  She understands return to the ER for new or worsening symptoms        Lyanne Co, MD 07/28/12 [phone]

## 2012-07-28 NOTE — ED Notes (Signed)
Pt c/o rectal bleeding and back pain that "goes down to her knees" x 3 weeks.  Denies NVD.  States that there are clots when she has a BM.  VSS.  A&O x4.

## 2012-08-14 ENCOUNTER — Ambulatory Visit (HOSPITAL_COMMUNITY): Payer: Medicaid Other | Attending: Physician Assistant

## 2012-08-14 ENCOUNTER — Encounter: Payer: Self-pay | Admitting: Internal Medicine

## 2012-08-16 ENCOUNTER — Encounter: Payer: Self-pay | Admitting: Internal Medicine

## 2012-08-16 ENCOUNTER — Other Ambulatory Visit (INDEPENDENT_AMBULATORY_CARE_PROVIDER_SITE_OTHER): Payer: Self-pay

## 2012-08-16 ENCOUNTER — Ambulatory Visit (INDEPENDENT_AMBULATORY_CARE_PROVIDER_SITE_OTHER): Payer: Self-pay | Admitting: Internal Medicine

## 2012-08-16 VITALS — BP 130/60 | HR 70 | Ht 63.0 in | Wt 143.4 lb

## 2012-08-16 DIAGNOSIS — R109 Unspecified abdominal pain: Secondary | ICD-10-CM

## 2012-08-16 DIAGNOSIS — R1030 Lower abdominal pain, unspecified: Secondary | ICD-10-CM

## 2012-08-16 DIAGNOSIS — Z1211 Encounter for screening for malignant neoplasm of colon: Secondary | ICD-10-CM

## 2012-08-16 DIAGNOSIS — K625 Hemorrhage of anus and rectum: Secondary | ICD-10-CM

## 2012-08-16 DIAGNOSIS — D649 Anemia, unspecified: Secondary | ICD-10-CM

## 2012-08-16 LAB — CBC
HCT: 37.6 % (ref 36.0–46.0)
Hemoglobin: 12.6 g/dL (ref 12.0–15.0)
MCHC: 33.5 g/dL (ref 30.0–36.0)
MCV: 102.1 fl — ABNORMAL HIGH (ref 78.0–100.0)
Platelets: 298 10*3/uL (ref 150.0–400.0)
RBC: 3.68 Mil/uL — ABNORMAL LOW (ref 3.87–5.11)

## 2012-08-16 LAB — VITAMIN B12: Vitamin B-12: 379 pg/mL (ref 211–911)

## 2012-08-16 MED ORDER — TRAMADOL HCL 50 MG PO TABS: 50.0000 mg | ORAL_TABLET | Freq: Four times a day (QID) | ORAL | Status: DC | PRN

## 2012-08-16 MED ORDER — PEG-KCL-NACL-NASULF-NA ASC-C 100 G PO SOLR: 1.0000 | Freq: Once | ORAL | Status: DC

## 2012-08-16 NOTE — Progress Notes (Signed)
Patient ID: Jaime Phillips, female   DOB: 1961/05/13, 52 y.o.   MRN: [id number]  SUBJECTIVE: HPI Jaime Phillips is a 52 year old female who is seen for evaluation of lower bowel pain and rectal bleeding. The patient states that she has developed bright red blood parenchyma next with clots on and off over the last 3 weeks. She reports red and dark red blood mixed with brown stool. Her bleeding is been painless as she is passing her stool.  She has noted lower abdominal pain which is sharp and radiates into her groin and into her anterior bilateral legs. She also feels some pain in her lower back. The pain is not necessarily abate with bowel movement. She is having one to 2 stools per day, but they are a little looser than they have been. Normal for her as one stool, formed, per day.  She has seen occasional blood "spotting" with her stool throughout the years, but the bleeding of late has been much more significant. She reports a good appetite without nausea or vomiting. She has not lost weight. She had been using Tylenol and ibuprofen for her lower bowel pain, and she feels like her rectal bleeding is worse when she is using these medicines. She was given Percocet from the emergency department and this did help the pain though made her sleepy.  She recalls a colonoscopy from greater than 5 years ago, but she does not recall the results. She's not sure who performed this test  Review of Systems  As per history of present illness, otherwise negative   Past Medical History  Diagnosis Date  . Abnormal uterine bleeding     Current Outpatient Prescriptions  Medication Sig Dispense Refill  . acetaminophen (TYLENOL) 500 MG tablet Take 500 mg by mouth every 6 (six) hours as needed. For pain.      . Coenzyme Q10 (CO Q 10 PO) Take by mouth daily.      . Ibuprofen-Diphenhydramine HCl (ADVIL PM) 200-25 MG CAPS Take 2 tablets by mouth every 8 (eight) hours as needed. For pain.      Marland Kitchen MULTIPLE VITAMIN PO Take by  mouth daily.      Marland Kitchen omeprazole (PRILOSEC) 20 MG capsule Take 1 capsule (20 mg total) by mouth daily.  30 capsule  0  . VITAMIN E PO Take by mouth daily.        Allergies  Allergen Reactions  . Codeine Other (See Comments)    hallucinating  . Morphine And Related     Made head feel funny    Family History  Problem Relation Age of Onset  . Diabetes Mother   . Heart disease Father   . Kidney disease Mother   . Colon cancer      ? father    History  Substance Use Topics  . Smoking status: Current Every Day Smoker -- 0.5 packs/day for 15 years    Types: Cigarettes  . Smokeless tobacco: Never Used     Comment: Tobacco info given to pt. 08/16/12  . Alcohol Use: 1.2 oz/week    2 Shots of liquor per week    OBJECTIVE: BP 130/60  Pulse 70  Ht 5\' 3"  (1.6 m)  Wt 143 lb 6.4 oz (65.046 kg)  BMI 25.40 kg/m2 Constitutional: Well-developed and well-nourished. No distress. HEENT: Normocephalic and atraumatic. Oropharynx is clear and moist. No oropharyngeal exudate. Conjunctivae are normal. No scleral icterus. Neck: Neck supple. Trachea midline. Cardiovascular: Normal rate, regular rhythm and intact distal pulses. No  M/R/G Pulmonary/chest: Effort normal and breath sounds normal. No wheezing, rales or rhonchi. Abdominal: Soft, mild lower abdominal tenderness without rebound or guarding, nondistended. Bowel sounds active throughout. There are no masses palpable. No hepatosplenomegaly. Extremities: no clubbing, cyanosis, or edema Neurological: Alert and oriented to person place and time. Skin: Skin is warm and dry. No rashes noted. Psychiatric: Normal mood and affect. Behavior is normal.  Labs and Imaging -- CBC    Component Value Date/Time   WBC 5.4 07/28/2012 1426   RBC 3.18* 07/28/2012 1426   HGB 11.3* 07/28/2012 1426   HCT 32.9* 07/28/2012 1426   PLT 277 07/28/2012 1426   MCV 103.5* 07/28/2012 1426   MCH 35.5* 07/28/2012 1426   MCHC 34.3 07/28/2012 1426   RDW 12.5 07/28/2012  1426    CMP     Component Value Date/Time   NA 140 07/28/2012 1426   K 3.8 07/28/2012 1426   CL 109 07/28/2012 1426   CO2 20 07/28/2012 1426   GLUCOSE 106* 07/28/2012 1426   BUN 8 07/28/2012 1426   CREATININE 0.69 07/28/2012 1426   CALCIUM 9.0 07/28/2012 1426   PROT 6.7 07/19/2012 1718   ALBUMIN 3.7 07/19/2012 1718   AST 21 07/19/2012 1718   ALT 16 07/19/2012 1718   ALKPHOS 60 07/19/2012 1718   BILITOT 0.2* 07/19/2012 1718   GFRNONAA >90 07/28/2012 1426   GFRAA >90 07/28/2012 1426    Clinical Data:   Rectal bleeding   CT ABDOMEN AND PELVIS WITH CONTRAST -- 07/28/2012   Technique:  Multidetector CT imaging of the abdomen and pelvis was performed following the standard protocol during bolus administration of intravenous contrast.   Contrast: OMNIPAQUE IOHEXOL 300 MG/ML  SOLN   Comparison: None.   Findings: Images which include the lung bases are unremarkable.   The liver, spleen, stomach, duodenum, pancreas, and gallbladder are unremarkable.  No evidence for adrenal mass.  Kidneys have normal imaging features.   Mild circumferential wall thickening is noted in the distal esophagus (see image seven of series 2).  This is probably related to reflux esophagitis, but neoplasm could have this appearance.   No abdominal aortic aneurysm.  No free fluid or lymphadenopathy in the abdomen.  The abdominal bowel loops have normal imaging features.   Images through the pelvis show no intraperitoneal free fluid. There is no pelvic sidewall lymphadenopathy.  Bladder is unremarkable.  Uterus is normal.  No adnexal mass.   Diverticular disease is seen in the left colon without evidence for diverticulitis the terminal ileum is normal. Nonvisualization of the appendix is consistent with the reported history of appendectomy.   2 x 2 x 3 cm calcification in the proximal left iliopsoas complex, anterior to the hip, may be related to chronic bursal inflammation. This could  also be related to an old hematoma or muscular injury. Bone windows reveal no worrisome lytic or sclerotic osseous lesions.   IMPRESSION: No acute findings in the abdomen or pelvis.  Specifically, no changes to explain the patient's reported history of rectal bleeding.   Diverticular disease noted in the left colon without diverticulitis.   Colonoscopy may be indicated and correlation with colorectal cancer screening history recommended.   ASSESSMENT AND PLAN: 52 year old female who is seen for evaluation of lower bowel pain and rectal bleeding.  1.  Rectal bleeding/lower abd pain -- I recommended colonoscopy for further evaluation of her rectal bleeding and abdominal pain. Her CT scan did not show evidence for diverticulitis, but she does have  left-sided diverticulosis. The differential includes hemorrhoidal bleeding, diverticular bleeding, inflammatory bowel disease, and less likely a large polyp or mass.  We discussed the test today including the risks and benefits and she is agreeable to proceed. I will give her prescription for tramadol to be used as needed and as directed for lower abdominal pain. Further recommendations to made after procedure. I will recheck a CBC today.  2. Anemia -- macrocytic. Will check B12 and folate levels today. Certainly could be partially resulting from recent bleeding, see #1

## 2012-08-16 NOTE — Patient Instructions (Addendum)
You have been scheduled for a colonoscopy with propofol. Please follow written instructions given to you at your visit today.  Please pick up your prep kit at the pharmacy within the next 1-3 days. If you use inhalers (even only as needed) or a CPAP machine, please bring them with you on the day of your procedure.  Your physician has requested that you go to the basement for the following lab work before leaving today: CBC, b-12 Folate  We have sent the following medications to your pharmacy for you to pick up at your convenience: Tramadol; take as directed

## 2012-08-19 ENCOUNTER — Telehealth: Payer: Self-pay | Admitting: Gastroenterology

## 2012-08-19 NOTE — Telephone Encounter (Signed)
Patient called stating that she developed nausea and chest discomfort while taking tramadol. Tylenol helps the pain but she knows that she is having side effects from the medications.  She was instructed to discontinue tramadol and to take Advil up to 800 mg every 6 hours as needed. She is scheduled for colonoscopy on Monday.

## 2012-08-21 ENCOUNTER — Encounter: Payer: Self-pay | Admitting: Internal Medicine

## 2012-08-21 ENCOUNTER — Ambulatory Visit (AMBULATORY_SURGERY_CENTER): Payer: Self-pay | Admitting: Internal Medicine

## 2012-08-21 ENCOUNTER — Other Ambulatory Visit (HOSPITAL_COMMUNITY): Payer: Self-pay | Admitting: Medical

## 2012-08-21 VITALS — BP 141/68 | HR 69 | Temp 98.8°F | Resp 20 | Ht 63.0 in | Wt 143.0 lb

## 2012-08-21 DIAGNOSIS — D126 Benign neoplasm of colon, unspecified: Secondary | ICD-10-CM

## 2012-08-21 DIAGNOSIS — Z1211 Encounter for screening for malignant neoplasm of colon: Secondary | ICD-10-CM

## 2012-08-21 DIAGNOSIS — Z538 Procedure and treatment not carried out for other reasons: Secondary | ICD-10-CM

## 2012-08-21 MED ORDER — HYDROCORTISONE ACETATE 25 MG RE SUPP: 25.0000 mg | Freq: Two times a day (BID) | RECTAL | Status: DC

## 2012-08-21 MED ORDER — SODIUM CHLORIDE 0.9 % IV SOLN: 500.0000 mL | INTRAVENOUS | Status: DC

## 2012-08-21 NOTE — Progress Notes (Signed)
No complaints noted in the recovery room. Maw  Patient did not experience any of the following events: a burn prior to discharge; a fall within the facility; wrong site/side/patient/procedure/implant event; or a hospital transfer or hospital admission upon discharge from the facility. (G8907) Patient did not have preoperative order for IV antibiotic SSI prophylaxis. (G8918)  

## 2012-08-21 NOTE — Progress Notes (Signed)
The pt c/o leg pain and asked for pain medication.  Per Dr. Rhea Belton pt may use tylenol for pain 4,000 mg maxium dose within 24 hours.  He recommended she see a spine doctor for the pain.  Also per Dr. Rhea Belton he will set up the pt's next colonoscopy with 2 day pre.  I did not have to set this up today.  Maw

## 2012-08-21 NOTE — Progress Notes (Signed)
Called to room to assist during endoscopic procedure.  Patient ID and intended procedure confirmed with present staff. Received instructions for my participation in the procedure from the performing physician. ewm 

## 2012-08-21 NOTE — Op Note (Signed)
[facility] [address] [place], [address]   COLONOSCOPY PROCEDURE REPORT  PATIENT: Jaime Phillips, Jaime Phillips  MR#: [id number] BIRTHDATE: Jul 02, 1961 , 51  yrs. old GENDER: Female ENDOSCOPIST: Beverley Fiedler, MD PROCEDURE DATE:  08/21/2012 PROCEDURE:   Colonoscopy with cold biopsy polypectomy ASA CLASS:   Class II INDICATIONS:Abdominal pain, rectal bleeding, and first colonoscopy.  MEDICATIONS: MAC sedation, administered by CRNA and propofol (Diprivan) 400mg  IV  DESCRIPTION OF PROCEDURE:   After the risks benefits and alternatives of the procedure were thoroughly explained, informed consent was obtained.  A digital rectal exam revealed no rectal mass.   The LB [id number]  endoscope was introduced through the anus and advanced to the cecum, which was identified by both the appendix and ileocecal valve. No adverse events experienced. The quality of the prep was poor, using MoviPrep  The instrument was then slowly withdrawn as the colon was fully examined.   COLON FINDINGS: A significant amount of stool was present The finding was in the right colon.   Moderate diverticulosis was noted in the ascending colon, descending colon, and sigmoid colon. Moderate sized internal hemorrhoids were found.   Two sessile polyps measuring 3-5 mm in size were found in the rectum. Polypectomy was performed with cold forceps.  All resections were complete and all polyp tissue was completely retrieved. Retroflexed views revealed internal hemorrhoids which were oozing blood. The time to cecum=2 minutes 35 seconds.  Withdrawal time=9 minutes 20 seconds.  The scope was withdrawn and the procedure completed.  COMPLICATIONS: There were no complications.  ENDOSCOPIC IMPRESSION: 1.   Significant amount of stool was present in the right colon 2.   Moderate diverticulosis was noted in the ascending colon, descending colon, and sigmoid colon 3.   Moderate sized internal hemorrhoids with  oozing of blood. 4.   Two sessile polyps measuring 3-5 mm in size were found in the rectum; Polypectomy was performed with cold forceps  RECOMMENDATIONS: 1.  Await pathology results 2.  High fiber diet 3.  Examination for colon polyps inadequate in the right colon due to poor preparation.  I recommend repeating the colonoscopy with 2 day bowel preparation for colorectal cancer screening. 4.  Hydrocortisone suppository 25 mg twice daily for bleeding internal hemorrhoids   eSigned:  Beverley Fiedler, MD 08/21/2012 4:42 PM   cc: The Patient   PATIENT NAME:  Jaime Phillips, Jaime Phillips MR#: [id number]

## 2012-08-21 NOTE — Patient Instructions (Addendum)
Rx sent to your pharmacy for suppositories for hemorrhoids.  Handouts were given to your care partner for polyps, diverticulosis, high fiber diet and hemorrhoids.  Due to stool in the right side of the colon, Dr. Rhea Belton recommends repeating the colonoscopy with a two day bowel preparation.  Per Dr. Rhea Belton you may use tylenol for pain may have 4,000 mg maximum within 24 hour.  Dr. Lauro Franklin nurse will call with follow up appointment.  You may resume your current medications today.  Please call if any questions or concerns.    YOU HAD AN ENDOSCOPIC PROCEDURE TODAY AT THE [facility]: Refer to the procedure report that was given to you for any specific questions about what was found during the examination.  If the procedure report does not answer your questions, please call your gastroenterologist to clarify.  If you requested that your care partner not be given the details of your procedure findings, then the procedure report has been included in a sealed envelope for you to review at your convenience later.  YOU SHOULD EXPECT: Some feelings of bloating in the abdomen. Passage of more gas than usual.  Walking can help get rid of the air that was put into your GI tract during the procedure and reduce the bloating. If you had a lower endoscopy (such as a colonoscopy or flexible sigmoidoscopy) you may notice spotting of blood in your stool or on the toilet paper. If you underwent a bowel prep for your procedure, then you may not have a normal bowel movement for a few days.  DIET: Your first meal following the procedure should be a light meal and then it is ok to progress to your normal diet.  A half-sandwich or bowl of soup is an example of a good first meal.  Heavy or fried foods are harder to digest and may make you feel nauseous or bloated.  Likewise meals heavy in dairy and vegetables can cause extra gas to form and this can also increase the bloating.  Drink plenty of fluids but you should avoid  alcoholic beverages for 24 hours.  ACTIVITY: Your care partner should take you home directly after the procedure.  You should plan to take it easy, moving slowly for the rest of the day.  You can resume normal activity the day after the procedure however you should NOT DRIVE or use heavy machinery for 24 hours (because of the sedation medicines used during the test).    SYMPTOMS TO REPORT IMMEDIATELY: A gastroenterologist can be reached at any hour.  During normal business hours, 8:30 AM to 5:00 PM Monday through Friday, call [phone].  After hours and on weekends, please call the GI answering service at [phone] who will take a message and have the physician on call contact you.   Following lower endoscopy (colonoscopy or flexible sigmoidoscopy):  Excessive amounts of blood in the stool  Significant tenderness or worsening of abdominal pains  Swelling of the abdomen that is new, acute  Fever of 100F or higher   FOLLOW UP: If any biopsies were taken you will be contacted by phone or by letter within the next 1-3 weeks.  Call your gastroenterologist if you have not heard about the biopsies in 3 weeks.  Our staff will call the home number listed on your records the next business day following your procedure to check on you and address any questions or concerns that you may have at that time regarding the information given to you  following your procedure. This is a courtesy call and so if there is no answer at the home number and we have not heard from you through the emergency physician on call, we will assume that you have returned to your regular daily activities without incident.  SIGNATURES/CONFIDENTIALITY: You and/or your care partner have signed paperwork which will be entered into your electronic medical record.  These signatures attest to the fact that that the information above on your After Visit Summary has been reviewed and is understood.  Full responsibility of the  confidentiality of this discharge information lies with you and/or your care-partner.

## 2012-08-22 ENCOUNTER — Telehealth: Payer: Self-pay | Admitting: *Deleted

## 2012-08-22 ENCOUNTER — Telehealth: Payer: Self-pay | Admitting: Internal Medicine

## 2012-08-22 MED ORDER — OMEPRAZOLE 20 MG PO CPDR: 20.0000 mg | DELAYED_RELEASE_CAPSULE | Freq: Every day | ORAL | Status: DC

## 2012-08-22 NOTE — Telephone Encounter (Signed)
No answer left message to call office if questions or concerns. 

## 2012-08-22 NOTE — Telephone Encounter (Signed)
Resent Omeprazole to [facility] on [address].

## 2012-08-22 NOTE — Telephone Encounter (Signed)
Stacy Please route RX for omeprazole Thanks

## 2012-08-28 ENCOUNTER — Encounter: Payer: Self-pay | Admitting: Internal Medicine

## 2012-08-30 ENCOUNTER — Encounter: Payer: Self-pay | Admitting: Internal Medicine

## 2012-09-01 ENCOUNTER — Telehealth: Payer: Self-pay | Admitting: *Deleted

## 2012-09-01 NOTE — Telephone Encounter (Signed)
lmom for pt to call back. Per 08/21/12 COLON report, Dr Rhea Belton wanted the COLON repeated with 2 day prep.

## 2012-09-05 NOTE — Telephone Encounter (Signed)
Pt called back and we scheduled her for a March procedure. She reports she still has rectal bleeding on and off and the suppositories don't do much good. She also reports pain in the Right  Side of her stomach that radiates down her leg. She was given tramadol, but it makes her dizzy and nauseated, but it helps the pain. She asked for Percocet, but I informed her we do not like to order Narcotics because of the side effects like constipation. She reports Tylenol foesn't help; Advil is better. Informed her Advil can cause bleeding problems do use with caution. Informed pt she is still have her original issues and since her procedure isn't until 10/06/12, maybe she should come in. Pt stated understanding and will come in on 09/15/12.

## 2012-09-06 ENCOUNTER — Ambulatory Visit (HOSPITAL_COMMUNITY)
Admission: RE | Admit: 2012-09-06 | Discharge: 2012-09-06 | Disposition: A | Discharge status: Home / Self Care | Payer: Self-pay | Source: Ambulatory Visit | Attending: Physician Assistant | Admitting: Physician Assistant

## 2012-09-06 DIAGNOSIS — Z1231 Encounter for screening mammogram for malignant neoplasm of breast: Secondary | ICD-10-CM

## 2012-09-07 ENCOUNTER — Other Ambulatory Visit: Payer: Self-pay | Admitting: Physician Assistant

## 2012-09-07 DIAGNOSIS — R928 Other abnormal and inconclusive findings on diagnostic imaging of breast: Secondary | ICD-10-CM

## 2012-09-12 ENCOUNTER — Telehealth: Payer: Self-pay | Admitting: Internal Medicine

## 2012-09-12 NOTE — Telephone Encounter (Signed)
lmom for pt to call back

## 2012-09-15 ENCOUNTER — Ambulatory Visit: Payer: Self-pay | Admitting: Internal Medicine

## 2012-09-16 ENCOUNTER — Encounter (HOSPITAL_COMMUNITY): Payer: Self-pay | Admitting: Emergency Medicine

## 2012-09-16 ENCOUNTER — Emergency Department (HOSPITAL_COMMUNITY)
Admission: EM | Admit: 2012-09-16 | Discharge: 2012-09-17 | Disposition: A | Discharge status: Home / Self Care | Payer: Self-pay | Attending: Emergency Medicine | Admitting: Emergency Medicine

## 2012-09-16 ENCOUNTER — Emergency Department (HOSPITAL_COMMUNITY): Payer: Self-pay

## 2012-09-16 DIAGNOSIS — S52509A Unspecified fracture of the lower end of unspecified radius, initial encounter for closed fracture: Secondary | ICD-10-CM | POA: Insufficient documentation

## 2012-09-16 DIAGNOSIS — Z8742 Personal history of other diseases of the female genital tract: Secondary | ICD-10-CM | POA: Insufficient documentation

## 2012-09-16 DIAGNOSIS — W010XXA Fall on same level from slipping, tripping and stumbling without subsequent striking against object, initial encounter: Secondary | ICD-10-CM | POA: Insufficient documentation

## 2012-09-16 DIAGNOSIS — F17200 Nicotine dependence, unspecified, uncomplicated: Secondary | ICD-10-CM | POA: Insufficient documentation

## 2012-09-16 DIAGNOSIS — F1010 Alcohol abuse, uncomplicated: Secondary | ICD-10-CM | POA: Insufficient documentation

## 2012-09-16 DIAGNOSIS — Y9389 Activity, other specified: Secondary | ICD-10-CM | POA: Insufficient documentation

## 2012-09-16 DIAGNOSIS — X500XXA Overexertion from strenuous movement or load, initial encounter: Secondary | ICD-10-CM | POA: Insufficient documentation

## 2012-09-16 DIAGNOSIS — Y9289 Other specified places as the place of occurrence of the external cause: Secondary | ICD-10-CM | POA: Insufficient documentation

## 2012-09-16 DIAGNOSIS — M542 Cervicalgia: Secondary | ICD-10-CM | POA: Insufficient documentation

## 2012-09-16 DIAGNOSIS — Z8781 Personal history of (healed) traumatic fracture: Secondary | ICD-10-CM | POA: Insufficient documentation

## 2012-09-16 DIAGNOSIS — S52613A Displaced fracture of unspecified ulna styloid process, initial encounter for closed fracture: Secondary | ICD-10-CM

## 2012-09-16 NOTE — ED Provider Notes (Signed)
History    This chart was scribed for non-physician practitioner [name], PA working with [name], MD by [name], ED Scribe. This Jaime Phillips was seen in room WTR5/WTR5 and the Jaime Phillips's care was started at 22:12.    CSN: [id number]  Arrival date & time 09/16/12  2207      Chief Complaint  Jaime Phillips presents with  . Wrist Pain    (Consider location/radiation/quality/duration/timing/severity/associated sxs/prior treatment) Jaime Phillips is a 52 y.o. female presenting with wrist pain. The history is provided by the Jaime Phillips. No language interpreter was used.  Wrist Pain Pertinent negatives include no chest pain, no abdominal pain and no headaches.   Jaime Phillips is a 53 y.o. female who presents to the Emergency Department complaining of constant left arm pain as a result of a fall that occurred this afternoon.   The Jaime Phillips states that she slipped on ice and she reached back and twisted her left wrist in attempt to catch herself. The Jaime Phillips states that she has not taken any medication since, but does provide that she has consumed gin with no relief. She notes hx of fracture to the same arm and she states no screws were placed during previous treatment. Pt notes neck pain, but states it is at baseline.  The onset of this condition was acute. The course is constant. Aggravating factors: movement. Alleviating factors: none.    Past Medical History  Diagnosis Date  . Abnormal uterine bleeding     Past Surgical History  Procedure Laterality Date  . Appendectomy    . Oophorectomy    . Shoulder surgery      left shoulder  . Colonoscopy      Family History  Problem Relation Age of Onset  . Diabetes Mother   . Heart disease Father   . Kidney disease Mother   . Colon cancer      ? father    History  Substance Use Topics  . Smoking status: Current Every Day Smoker -- 0.50 packs/day for 15 years    Types: Cigarettes  . Smokeless tobacco: Never Used     Comment: Tobacco  info given to pt. 08/16/12  . Alcohol Use: 1.2 oz/week    2 Shots of liquor per week     Review of Systems  Constitutional: Negative for fever and activity change.  HENT: Positive for neck pain (Baseline). Negative for sore throat and rhinorrhea.   Eyes: Negative for redness.  Respiratory: Negative for cough.   Cardiovascular: Negative for chest pain.  Gastrointestinal: Negative for nausea, vomiting, abdominal pain and diarrhea.  Genitourinary: Negative for dysuria.  Musculoskeletal: Positive for joint swelling and arthralgias. Negative for myalgias and back pain.       Left wrist injury with pain  Skin: Negative for rash and wound.  Neurological: Negative for weakness, numbness and headaches.  All other systems reviewed and are negative.    Allergies  Codeine and Morphine and related  Home Medications   Current Outpatient Rx  Name  Route  Sig  Dispense  Refill  . acetaminophen (TYLENOL) 500 MG tablet   Oral   Take 500 mg by mouth every 6 (six) hours as needed. For pain.         . Coenzyme Q10 (CO Q 10 PO)   Oral   Take by mouth daily.         . hydrocortisone (ANUSOL-HC) 25 MG suppository   Rectal   Place 1 suppository (25 mg total) rectally every 12 (twelve)  hours.   24 suppository   1   . Ibuprofen-Diphenhydramine HCl (ADVIL PM) 200-25 MG CAPS   Oral   Take 2 tablets by mouth every 8 (eight) hours as needed. For pain.         Marland Kitchen MULTIPLE VITAMIN PO   Oral   Take by mouth daily.         Marland Kitchen omeprazole (PRILOSEC) 20 MG capsule   Oral   Take 1 capsule (20 mg total) by mouth daily.   30 capsule   0   . traMADol (ULTRAM) 50 MG tablet   Oral   Take 1 tablet (50 mg total) by mouth every 6 (six) hours as needed for pain.   45 tablet   0   . VITAMIN E PO   Oral   Take by mouth daily.           BP 126/95  Pulse 99  Temp(Src) 98.2 F (36.8 C) (Oral)  Resp 17  SpO2 97%  Physical Exam  Nursing note and vitals reviewed. Constitutional: She is  oriented to person, place, and time. She appears well-developed and well-nourished. No distress.  Odor of alcohol noted, pt visibly intoxicated  HENT:  Head: Normocephalic and atraumatic.  Eyes: Conjunctivae and EOM are normal. Pupils are equal, round, and reactive to light. Right eye exhibits no discharge. Left eye exhibits no discharge.  Neck: Normal range of motion. Neck supple. No tracheal deviation present.  Cardiovascular: Normal rate, regular rhythm and normal heart sounds.  Exam reveals no decreased pulses.   Pulmonary/Chest: Effort normal and breath sounds normal. No respiratory distress.  Abdominal: Soft. She exhibits no distension. There is no tenderness.  Musculoskeletal: Normal range of motion. She exhibits tenderness. She exhibits no edema.  Tenderness over the right distal radius. Deformity. Good radial pulses and sensation is intact.   Neurological: She is alert and oriented to person, place, and time. No sensory deficit.  Motor, sensation, and vascular distal to the injury is fully intact.   Skin: Skin is warm and dry.  Psychiatric:  Intoxicated    ED Course  Procedures (including critical care time) DIAGNOSTIC STUDIES: Oxygen Saturation is 99% on room air, normal by my interpretation.    COORDINATION OF CARE: 22:13: Physical exam performed and intent to x-ray left arm.   22:20 PM-Consult complete with Dr.Gramig, hand surgical specialist. Jaime Phillips case explained and discussed. Dr. Amanda Pea agrees to come in to ED for further Jaime Phillips evaluation and treatment. Call ended at 22:24  Labs Reviewed - No data to display Dg Wrist Complete Left  09/16/2012  *RADIOLOGY REPORT*  Clinical Data: Fall.  LEFT WRIST - COMPLETE 3+ VIEW  Comparison: 12/09/2010  Findings: Comminuted distal radius fracture noted.  There is dorsal angulation of the distal fracture fragments.  Acute ulnar styloid fracture is also noted.  No radiopaque foreign bodies noted.  IMPRESSION: Acute fracture involves  the distal radius with dorsal angulation of the distal fracture fragments.   Original Report Authenticated By: Signa Kell, M.D.      1. Distal radius fracture   2. Fracture of ulnar styloid    Jaime Phillips seen and examined. Work-up initiated. Jaime Phillips is intoxicated.   Vital signs reviewed and are as follows: Filed Vitals:   09/16/12 2217  BP: 126/95  Pulse: 99  Temp: 98.2 F (36.8 C)  Resp: 17   Imaging reviewed by myself and Dr. Adriana Simas.  I spoke with Dr. Amanda Pea who came to the emergency department and reduced the fracture.  He has given the Jaime Phillips followup instructions and pain medication.  MDM  Wrist fx, seen by ortho in ED and reduced.   I personally performed the services described in this documentation, which was scribed in my presence. The recorded information has been reviewed and is accurate.         [name], [place] 09/17/12 [phone]

## 2012-09-16 NOTE — ED Notes (Signed)
Pt alert, arrives from home, c/o pain in left wrist after slip fall injury, PMS intact, + edema, resp even unlabored, skin pwd

## 2012-09-17 ENCOUNTER — Emergency Department (HOSPITAL_COMMUNITY): Payer: Self-pay

## 2012-09-17 MED ORDER — TAPENTADOL HCL 50 MG PO TABS: 100.0000 mg | ORAL_TABLET | ORAL | Status: DC | PRN

## 2012-09-17 NOTE — Discharge Summary (Signed)
  Final diagnosis closed reduction distal radius fracture with associated ulnar styloid fracture  Please see full consult  Dominica Severin M.D.

## 2012-09-17 NOTE — ED Provider Notes (Signed)
Medical screening examination/treatment/procedure(s) were performed by non-physician practitioner and as supervising physician I was immediately available for consultation/collaboration.  [name], MD 09/17/12 1728

## 2012-09-17 NOTE — Consult Note (Signed)
Reason for Consult: Fracture left radius closed Referring Physician: ER staff Jaime Phillips is an 52 y.o. female.  HPI: 52 year old female presents with a left radius fracture displaced. She is here with her significant other. She states she's had a few drinks tonight. The a patient and I had long discussion. She denies neck back chest or abdominal pain she states she has left radius pain she denies other issues. The a patient I've had long conversation in regards to the wrist fracture and she desires to proceed with closed reduction at my recommendation. She understands this. The a patient has had certainly some drinks rates his alcohol beverages) tonight. Nevertheless she does have pain appropriate to her fracture. She denies nausea vomiting headache visual change or other problems. Her significant other is here with her and we'll plan to stay with her throughout the night.  Past Medical History  Diagnosis Date  . Abnormal uterine bleeding     Past Surgical History  Procedure Laterality Date  . Appendectomy    . Oophorectomy    . Shoulder surgery      left shoulder  . Colonoscopy      Family History  Problem Relation Age of Onset  . Diabetes Mother   . Heart disease Father   . Kidney disease Mother   . Colon cancer      ? father    Social History:  reports that she has been smoking Cigarettes.  She has a 7.5 pack-year smoking history. She has never used smokeless tobacco. She reports that she drinks about 1.2 ounces of alcohol per week. She reports that she does not use illicit drugs.  Allergies:  Allergies  Allergen Reactions  . Codeine Other (See Comments)    hallucinating  . Morphine And Related     Made head feel funny    Medications: I have reviewed the patient's current medications.  No results found for this or any previous visit (from the past 48 hour(s)).  Dg Wrist Complete Left  09/16/2012  *RADIOLOGY REPORT*  Clinical Data: Fall.  LEFT WRIST - COMPLETE 3+  VIEW  Comparison: 12/09/2010  Findings: Comminuted distal radius fracture noted.  There is dorsal angulation of the distal fracture fragments.  Acute ulnar styloid fracture is also noted.  No radiopaque foreign bodies noted.  IMPRESSION: Acute fracture involves the distal radius with dorsal angulation of the distal fracture fragments.   Original Report Authenticated By: Signa Kell, M.D.     Review of Systems  Constitutional: Negative.   HENT: Negative.   Respiratory: Negative.   Cardiovascular: Negative.   Gastrointestinal: Negative.   Genitourinary: Negative.   Skin: Negative.   Neurological: Negative.   Endo/Heme/Allergies: Negative.    Blood pressure 126/95, pulse 99, temperature 98.2 F (36.8 C), temperature source Oral, resp. rate 17, SpO2 97.00%. Physical Exam Left wrist has a displaced fracture she has intact sensation intact refill. Elbow is nontender about the left side shoulders nontender humerus is nontender she has normal skin characteristics and obvious deformity about the left wrist. She has classic on marked signs and symptoms of a comminuted distal radius fracture. The patient's right upper extremity is nontender neurovascularly intact. Marland Kitchen.The patient is alert and oriented in no acute distress the patient complains of pain in the affected upper extremity. The patient is noted to have a normal HEENT exam. Lung fields show equal chest expansion and no shortness of breath abdomen exam is nontender without distention. Lower extremity examination does not show any fracture dislocation  or blood clot symptoms. Pelvis is stable neck and back are stable and nontender  Assessment/Plan: Left distal radius fracture we'll plan for close reduction. Marland Kitchen.09/17/2012  12:15 AM  PATIENT:  Jaime Phillips    PRE-OPERATIVE DIAGNOSIS:    POST-OPERATIVE DIAGNOSIS:  Same  PROCEDURE:  Closed reduction left distal radius fracture SURGEON:  Karen Chafe, MD  PHYSICIAN ASSISTANT:    ANESTHESIA:   Hematoma block  PREOPERATIVE INDICATIONS:  Jaime Phillips is a  52 y.o. female with a diagnosis of   The risks benefits and alternatives were discussed with the patient preoperatively including but not limited to the risks of infection, bleeding, nerve injury, cardiopulmonary complications, the need for revision surgery, among others, and the patient was willing to proceed.   OPERATIVE PROCEDURE:  The risks benefits and alternatives were discussed with the patient preoperatively including but not limited to the risks of infection, bleeding, nerve injury, cardiopulmonary complications, the need for revision surgery, among others, and the patient was willing to proceed.   OPERATIVE PROCEDURE: The patient was seen and counseled in regard to the upper extremity predicament. Following this the extremity underwent a hematoma block with lidocaine without epinephrine. I sterilely prepped and draped a skin prior to doing so. Once this was accomplished the patient was placed in fingertrap traction and underwent a reduction of the distal radius. The fracture was reduced and following this a sugar tong splint/cast was applied without difficulty. The neurovascular status showed no evidence of compartment syndrome, dystrophy or infection. the patient had pink fingertips, excellent refill and no complications.  We have discussed with patient elevation,  range of motion to the fingers, massage and other measures to prevent neurovascular problems.  Once again we plan to proceed with ice elevation move and massage fingers. Postreduction x-ray showed improved position of the fracture. Will plan for serial radiographs and fixation based on the amount of comminution and progressive angulatory collapse as well as wrist Apgar scores. Patient understands these don'ts etc.   Following close reduction I discussed with the patient elevation range of motion and other measures she'll continue elevation range  of motion she'll be given pain management she'll return to see his Wednesday in the office. I discussed with her that given her age oftentimes we will certainly consider definitive fixation is best if she develops aggressive angulatory collapse and she understands this. I would like for her to be completely lucid/sober and had a conversation with the Wednesday so that we can ensure that she understands everything in complete detail. At the time of discharge she was awake alert and oriented had no problems and felt quite well. I asked her not to take pain medicine until tomorrow morning given the fact that she is concerned alcohol tonight. Nevertheless I felt comfortable discharging her given the fact that she is with someone who is appropriate alert and oriented and can care for her and she herself is appropriate alert and oriented and understands all instructions and verbalizes them.  Jaime Phillips III,Jaime Phillips M 09/17/2012, 12:12 AM

## 2012-09-25 NOTE — Telephone Encounter (Signed)
Pt never called back.

## 2012-10-02 ENCOUNTER — Ambulatory Visit (AMBULATORY_SURGERY_CENTER): Payer: Self-pay | Admitting: *Deleted

## 2012-10-02 VITALS — Ht 63.0 in | Wt 143.0 lb

## 2012-10-02 DIAGNOSIS — K625 Hemorrhage of anus and rectum: Secondary | ICD-10-CM

## 2012-10-02 DIAGNOSIS — R109 Unspecified abdominal pain: Secondary | ICD-10-CM

## 2012-10-02 MED ORDER — MOVIPREP 100 G PO SOLR: ORAL | Status: DC

## 2012-10-02 NOTE — Progress Notes (Signed)
Pt scheduled for colonoscopy 3/7. Pt had recent fracture of left arm.  Will see Orthopedic doctor on Wednesday 3/5 to see if she will need surgery.  Pt wants to cancel procedure and reschedule colonoscopy after seeing MD.  Colonoscopy scheduled for 3/7 cancelled.

## 2012-10-06 ENCOUNTER — Encounter: Payer: Self-pay | Admitting: Internal Medicine

## 2012-10-06 ENCOUNTER — Other Ambulatory Visit: Payer: Self-pay

## 2012-10-20 ENCOUNTER — Inpatient Hospital Stay: Admission: RE | Admit: 2012-10-20 | Payer: Self-pay | Source: Ambulatory Visit

## 2012-10-23 ENCOUNTER — Telehealth: Payer: Self-pay | Admitting: Internal Medicine

## 2012-10-23 MED ORDER — OMEPRAZOLE 20 MG PO CPDR: 20.0000 mg | DELAYED_RELEASE_CAPSULE | Freq: Every day | ORAL | Status: DC

## 2012-10-23 NOTE — Telephone Encounter (Signed)
Rx for omeprazole sent to pt's pharamcy

## 2012-11-03 ENCOUNTER — Other Ambulatory Visit: Payer: Self-pay | Admitting: Physician Assistant

## 2012-11-03 DIAGNOSIS — R928 Other abnormal and inconclusive findings on diagnostic imaging of breast: Secondary | ICD-10-CM

## 2012-11-06 ENCOUNTER — Other Ambulatory Visit: Payer: Self-pay | Admitting: *Deleted

## 2012-11-06 DIAGNOSIS — N63 Unspecified lump in unspecified breast: Secondary | ICD-10-CM

## 2012-11-07 ENCOUNTER — Encounter (HOSPITAL_COMMUNITY): Payer: Self-pay

## 2012-11-07 ENCOUNTER — Other Ambulatory Visit: Payer: Self-pay | Admitting: *Deleted

## 2012-11-07 ENCOUNTER — Ambulatory Visit (HOSPITAL_COMMUNITY)
Admission: RE | Admit: 2012-11-07 | Discharge: 2012-11-07 | Disposition: A | Discharge status: Home / Self Care | Payer: Self-pay | Source: Ambulatory Visit | Attending: Obstetrics and Gynecology | Admitting: Obstetrics and Gynecology

## 2012-11-07 VITALS — BP 124/86 | Temp 98.6°F | Ht 63.0 in | Wt 143.2 lb

## 2012-11-07 DIAGNOSIS — Z1239 Encounter for other screening for malignant neoplasm of breast: Secondary | ICD-10-CM

## 2012-11-07 DIAGNOSIS — N63 Unspecified lump in unspecified breast: Secondary | ICD-10-CM

## 2012-11-07 NOTE — Patient Instructions (Signed)
Taught patient how to perform BSE. Patient did not need a Pap smear today due to last Pap smear was 9/7/2011per patient. Let patient know her next Pap smear is due in September 2014. Told her she can come back to [facility] or can come to one of the free cervical cancer screenings to receive a Pap smear. Referred patient to the [facility] for left breast diagnostic mammogram and possible ultrasound per recommendation. Appointment scheduled for Friday, November 10, 2012 at 1040. Patient aware of appointment and will be there. Smoking cessation information given to patient. Patient verbalized understanding.

## 2012-11-07 NOTE — Progress Notes (Signed)
Patient referred to [facility] by the [facility] due to needing additional imaging of her left breast. Screening mammogram completed 09/06/2012 at [facility].  Pap Smear:    Pap smear not completed today. Last Pap smear was 04/08/2010 and normal per patient. Per patient no history of an abnormal Pap smear. No Pap smear results in EPIC.  Physical exam: Breasts Breasts symmetrical. No skin abnormalities bilateral breasts. No nipple retraction bilateral breasts. No nipple discharge bilateral breasts. No lymphadenopathy. No lumps palpated bilateral breasts. No complaints of pain or tenderness on exam. Referred patient to the [facility] for left breast diagnostic mammogram and possible ultrasound per recommendation. Appointment scheduled for Friday, November 10, 2012 at 1040.    Pelvic/Bimanual No Pap smear completed today since last Pap smear was 04/08/2010. Pap smear not indicated per BCCCP guidelines.

## 2012-11-10 ENCOUNTER — Ambulatory Visit
Admission: RE | Admit: 2012-11-10 | Discharge: 2012-11-10 | Disposition: A | Discharge status: Home / Self Care | Payer: No Typology Code available for payment source | Source: Ambulatory Visit | Attending: Physician Assistant | Admitting: Physician Assistant

## 2012-11-10 DIAGNOSIS — R928 Other abnormal and inconclusive findings on diagnostic imaging of breast: Secondary | ICD-10-CM

## 2013-03-30 NOTE — Progress Notes (Signed)
MATT-  WHEN YOU CAN WE NEED PRE OP ORDERS PLEASE-  HAS PST APPT 04/06/13  THANK YOU

## 2013-04-03 ENCOUNTER — Encounter (HOSPITAL_COMMUNITY): Payer: Self-pay | Admitting: Pharmacy Technician

## 2013-04-05 NOTE — Patient Instructions (Addendum)
Jaime Phillips  04/05/2013   Your procedure is scheduled on:  04/10/2013               Surgery 310pm-440pm  Report to [facility] at    12noon  Call this number if you have problems the morning of surgery: [phone]   Remember:   Do not eat food or drink liquids after midnight.   Take these medicines the morning of surgery with A SIP OF WATER:    Do not wear jewelry, make-up or nail polish.  Do not wear lotions, powders, or perfumes.   Do not shave 48 hours prior to surgery.   Do not bring valuables to the hospital.  Contacts, dentures or bridgework may not be worn into surgery.  Leave suitcase in the car. After surgery it may be brought to your room.  For patients admitted to the hospital, checkout time is 11:00 AM the day of  discharge.    SEE CHG INSTRUCTION SHEET    Please read over the following fact sheets that you were given: MRSA Information, coughing and deep breathing exercises, leg exercises, Blood Transfusion fact Sheet, Incentive Spirometry Fact Sheet                Failure to comply with these instructions may result in cancellation of your surgery.                Patient Signature ____________________________              Nurse Signature _____________________________

## 2013-04-06 ENCOUNTER — Encounter (HOSPITAL_COMMUNITY): Payer: Self-pay

## 2013-04-06 ENCOUNTER — Encounter (HOSPITAL_COMMUNITY)
Admission: RE | Admit: 2013-04-06 | Discharge: 2013-04-06 | Disposition: A | Discharge status: Home / Self Care | Payer: No Typology Code available for payment source | Source: Ambulatory Visit | Attending: Orthopedic Surgery | Admitting: Orthopedic Surgery

## 2013-04-06 DIAGNOSIS — Z01812 Encounter for preprocedural laboratory examination: Secondary | ICD-10-CM | POA: Insufficient documentation

## 2013-04-06 HISTORY — DX: Unspecified osteoarthritis, unspecified site: M19.90

## 2013-04-06 HISTORY — DX: Depression, unspecified: F32.A

## 2013-04-06 HISTORY — DX: Hemorrhage of anus and rectum: K62.5

## 2013-04-06 HISTORY — DX: Major depressive disorder, single episode, unspecified: F32.9

## 2013-04-06 LAB — URINALYSIS, ROUTINE W REFLEX MICROSCOPIC
Hgb urine dipstick: NEGATIVE
Nitrite: NEGATIVE
Specific Gravity, Urine: 1.03 (ref 1.005–1.030)
Urobilinogen, UA: 1 mg/dL (ref 0.0–1.0)
pH: 6 (ref 5.0–8.0)

## 2013-04-06 LAB — BASIC METABOLIC PANEL
BUN: 12 mg/dL (ref 6–23)
CO2: 24 mEq/L (ref 19–32)
Calcium: 9.3 mg/dL (ref 8.4–10.5)
GFR calc non Af Amer: >90 mL/min (ref >90)
Glucose, Bld: 81 mg/dL (ref 70–99)
Sodium: 139 mEq/L (ref 135–145)

## 2013-04-06 LAB — PROTIME-INR: Prothrombin Time: 11.5 seconds — ABNORMAL LOW (ref 11.6–15.2)

## 2013-04-06 LAB — SURGICAL PCR SCREEN
MRSA, PCR: NEGATIVE
Staphylococcus aureus: NEGATIVE

## 2013-04-06 LAB — CBC
MCH: 35.4 pg — ABNORMAL HIGH (ref 26.0–34.0)
MCHC: 33.4 g/dL (ref 30.0–36.0)
MCV: 105.9 fL — ABNORMAL HIGH (ref 78.0–100.0)
Platelets: 281 10*3/uL (ref 150–400)
RBC: 3.7 MIL/uL — ABNORMAL LOW (ref 3.87–5.11)

## 2013-04-06 LAB — URINE MICROSCOPIC-ADD ON

## 2013-04-06 NOTE — Progress Notes (Signed)
Urinalysis and micro results faxed via EPIC to Dr Olin.   

## 2013-04-06 NOTE — H&P (Signed)
TOTAL HIP ADMISSION H&P  Patient is admitted for right total hip arthroplasty, anterior approach.  Subjective:  Chief Complaint: Right hip OA / pain  HPI: Jaime Phillips, 52 y.o. female, has a history of pain and functional disability in the right hip(s) due to arthritis and patient has failed non-surgical conservative treatments for greater than 12 weeks to include NSAID's and/or analgesics, use of assistive devices and activity modification.  Onset of symptoms was gradual starting years ago with rapidlly worsening over the last 3-4 months.The patient noted no past surgery on the right hip(s).  Patient currently rates pain in the right hip at 9 out of 10 with activity. Patient has night pain, worsening of pain with activity and weight bearing, trendelenberg gait, pain that interfers with activities of daily living and pain with passive range of motion. Patient has evidence of periarticular osteophytes and joint space narrowing by imaging studies. This condition presents safety issues increasing the risk of falls. There is no current active infection.  Risks, benefits and expectations were discussed with the patient. Patient understand the risks, benefits and expectations and wishes to proceed with surgery.   D/C Plans:   Home with HHPT  Post-op Meds: No Rx given   Tranexamic Acid:   To be given  Decadron:    To be given  FYI:    ASA post-op   Past Medical History  Diagnosis Date  . Abnormal uterine bleeding   . Depression   . Arthritis   . Rectal bleeding     minor     Past Surgical History  Procedure Laterality Date  . Appendectomy    . Oophorectomy    . Shoulder surgery      left shoulder  . Colonoscopy      No prescriptions prior to admission   Allergies  Allergen Reactions  . Codeine Other (See Comments)    hallucinating  . Morphine And Related     Made head feel funny    History  Substance Use Topics  . Smoking status: Current Every Day Smoker -- 0.50 packs/day  for 25 years    Types: Cigarettes  . Smokeless tobacco: Never Used     Comment: Tobacco info given to pt. 08/16/12  . Alcohol Use: 1.8 oz/week    3 Shots of liquor per week    Family History  Problem Relation Age of Onset  . Diabetes Mother   . Kidney disease Mother   . Heart disease Father   . Colon cancer Father     "father may have had colon cancer"  . Cancer Sister     breast     Review of Systems  Constitutional: Negative.   Eyes: Negative.   Respiratory: Negative.   Cardiovascular: Negative.   Gastrointestinal: Positive for heartburn.  Genitourinary: Negative.   Musculoskeletal: Positive for myalgias, back pain and joint pain.  Skin: Negative.   Neurological: Positive for headaches.  Endo/Heme/Allergies: Negative.   Psychiatric/Behavioral: Positive for depression.    Objective:  Physical Exam  Constitutional: She is oriented to person, place, and time. She appears well-developed and well-nourished.  HENT:  Head: Normocephalic and atraumatic.  Mouth/Throat: Oropharynx is clear and moist.  Eyes: Pupils are equal, round, and reactive to light.  Neck: Neck supple. No JVD present. No tracheal deviation present. No thyromegaly present.  Cardiovascular: Normal rate, regular rhythm, normal heart sounds and intact distal pulses.   Respiratory: Effort normal and breath sounds normal. No respiratory distress. She has no wheezes.  GI: Soft. There is no tenderness. There is no guarding.  Musculoskeletal:       Right hip: She exhibits decreased range of motion, decreased strength, tenderness and bony tenderness. She exhibits no swelling, no deformity and no laceration.  Lymphadenopathy:    She has no cervical adenopathy.  Neurological: She is alert and oriented to person, place, and time.  Skin: Skin is warm and dry.  Psychiatric: She has a normal mood and affect.    Vital signs in last 24 hours: Temp:  [97.8 F (36.6 C)] 97.8 F (36.6 C) (09/05 1207) Pulse Rate:  [92]  92 (09/05 1207) Resp:  [16] 16 (09/05 1207) SpO2:  [96 %] 96 % (09/05 1207) Weight:  [63.05 kg (139 lb)] 63.05 kg (139 lb) (09/05 1207)  Labs:   Estimated body mass index is 25.37 kg/(m^2) as calculated from the following:   Height as of 11/07/12: 5\' 3"  (1.6 m).   Weight as of 11/07/12: 64.955 kg (143 lb 3.2 oz).   Imaging Review Plain radiographs demonstrate severe degenerative joint disease of the right hip(s). The bone quality appears to be good for age and reported activity level.  Assessment/Plan:  End stage arthritis, right hip(s)  The patient history, physical examination, clinical judgement of the provider and imaging studies are consistent with end stage degenerative joint disease of the right hip(s) and total hip arthroplasty is deemed medically necessary. The treatment options including medical management, injection therapy, arthroscopy and arthroplasty were discussed at length. The risks and benefits of total hip arthroplasty were presented and reviewed. The risks due to aseptic loosening, infection, stiffness, dislocation/subluxation,  thromboembolic complications and other imponderables were discussed.  The patient acknowledged the explanation, agreed to proceed with the plan and consent was signed. Patient is being admitted for inpatient treatment for surgery, pain control, PT, OT, prophylactic antibiotics, VTE prophylaxis, progressive ambulation and ADL's and discharge planning.The patient is planning to be discharged home with home health services.    Anastasio Auerbach Andromeda Poppen   PAC  04/06/2013, 4:02 PM

## 2013-04-10 ENCOUNTER — Encounter (HOSPITAL_COMMUNITY)
Admission: RE | Disposition: A | Discharge status: Home Health | Payer: Self-pay | Source: Ambulatory Visit | Attending: Orthopedic Surgery

## 2013-04-10 ENCOUNTER — Ambulatory Visit (HOSPITAL_COMMUNITY): Payer: No Typology Code available for payment source | Admitting: Anesthesiology

## 2013-04-10 ENCOUNTER — Inpatient Hospital Stay (HOSPITAL_COMMUNITY)
Admission: RE | Admit: 2013-04-10 | Discharge: 2013-04-12 | DRG: 470 | Disposition: A | Discharge status: Home Health | Payer: No Typology Code available for payment source | Source: Ambulatory Visit | Attending: Orthopedic Surgery | Admitting: Orthopedic Surgery

## 2013-04-10 ENCOUNTER — Encounter (HOSPITAL_COMMUNITY): Payer: Self-pay | Admitting: Anesthesiology

## 2013-04-10 ENCOUNTER — Ambulatory Visit (HOSPITAL_COMMUNITY): Payer: No Typology Code available for payment source

## 2013-04-10 ENCOUNTER — Inpatient Hospital Stay (HOSPITAL_COMMUNITY): Payer: No Typology Code available for payment source

## 2013-04-10 ENCOUNTER — Encounter (HOSPITAL_COMMUNITY): Payer: Self-pay | Admitting: *Deleted

## 2013-04-10 DIAGNOSIS — M1610 Unilateral primary osteoarthritis, unspecified hip: Principal | ICD-10-CM | POA: Diagnosis present

## 2013-04-10 DIAGNOSIS — D649 Anemia, unspecified: Secondary | ICD-10-CM | POA: Diagnosis not present

## 2013-04-10 DIAGNOSIS — Z96649 Presence of unspecified artificial hip joint: Secondary | ICD-10-CM

## 2013-04-10 DIAGNOSIS — F329 Major depressive disorder, single episode, unspecified: Secondary | ICD-10-CM | POA: Diagnosis present

## 2013-04-10 DIAGNOSIS — F3289 Other specified depressive episodes: Secondary | ICD-10-CM | POA: Diagnosis present

## 2013-04-10 DIAGNOSIS — F17200 Nicotine dependence, unspecified, uncomplicated: Secondary | ICD-10-CM | POA: Diagnosis present

## 2013-04-10 DIAGNOSIS — Z01812 Encounter for preprocedural laboratory examination: Secondary | ICD-10-CM

## 2013-04-10 DIAGNOSIS — D62 Acute posthemorrhagic anemia: Secondary | ICD-10-CM | POA: Diagnosis not present

## 2013-04-10 DIAGNOSIS — M169 Osteoarthritis of hip, unspecified: Principal | ICD-10-CM | POA: Diagnosis present

## 2013-04-10 HISTORY — PX: TOTAL HIP ARTHROPLASTY: SHX124

## 2013-04-10 LAB — TYPE AND SCREEN: Antibody Screen: NEGATIVE

## 2013-04-10 SURGERY — ARTHROPLASTY, HIP, TOTAL, ANTERIOR APPROACH
Anesthesia: General | Site: Hip | Laterality: Right | Wound class: Clean

## 2013-04-10 MED ORDER — DOCUSATE SODIUM 100 MG PO CAPS
100.0000 mg | ORAL_CAPSULE | Freq: Two times a day (BID) | ORAL | Status: DC
  Administered 2013-04-10 – 2013-04-12 (×4): 100 mg via ORAL

## 2013-04-10 MED ORDER — BISACODYL 10 MG RE SUPP: 10.0000 mg | Freq: Every day | RECTAL | Status: DC | PRN

## 2013-04-10 MED ORDER — FLEET ENEMA 7-19 GM/118ML RE ENEM: 1.0000 | ENEMA | Freq: Once | RECTAL | Status: AC | PRN

## 2013-04-10 MED ORDER — POLYETHYLENE GLYCOL 3350 17 G PO PACK
17.0000 g | PACK | Freq: Two times a day (BID) | ORAL | Status: DC
  Administered 2013-04-10 – 2013-04-12 (×3): 17 g via ORAL

## 2013-04-10 MED ORDER — DEXAMETHASONE SODIUM PHOSPHATE 10 MG/ML IJ SOLN: 10.0000 mg | Freq: Once | INTRAMUSCULAR | Status: DC

## 2013-04-10 MED ORDER — METHOCARBAMOL 100 MG/ML IJ SOLN
500.0000 mg | Freq: Four times a day (QID) | INTRAVENOUS | Status: DC | PRN
  Administered 2013-04-10 – 2013-04-11 (×3): 500 mg via INTRAVENOUS
  Filled 2013-04-10 (×4): qty 5

## 2013-04-10 MED ORDER — FENTANYL CITRATE 0.05 MG/ML IJ SOLN
INTRAMUSCULAR | Status: DC | PRN
  Administered 2013-04-10 (×3): 50 ug via INTRAVENOUS
  Administered 2013-04-10: 100 ug via INTRAVENOUS

## 2013-04-10 MED ORDER — CHLORHEXIDINE GLUCONATE 4 % EX LIQD: 60.0000 mL | CUTANEOUS | Status: DC

## 2013-04-10 MED ORDER — HYDROMORPHONE HCL PF 1 MG/ML IJ SOLN
INTRAMUSCULAR | Status: DC | PRN
  Administered 2013-04-10 (×2): 0.5 mg via INTRAVENOUS
  Administered 2013-04-10: 1 mg via INTRAVENOUS

## 2013-04-10 MED ORDER — ONDANSETRON HCL 4 MG/2ML IJ SOLN
INTRAMUSCULAR | Status: DC | PRN
  Administered 2013-04-10: 4 mg via INTRAVENOUS

## 2013-04-10 MED ORDER — HYDROMORPHONE HCL PF 1 MG/ML IJ SOLN
0.2500 mg | INTRAMUSCULAR | Status: DC | PRN
  Administered 2013-04-10 (×2): 0.5 mg via INTRAVENOUS

## 2013-04-10 MED ORDER — CEFAZOLIN SODIUM-DEXTROSE 2-3 GM-% IV SOLR
INTRAVENOUS | Status: AC
  Filled 2013-04-10: qty 50

## 2013-04-10 MED ORDER — METOCLOPRAMIDE HCL 10 MG PO TABS: 5.0000 mg | ORAL_TABLET | Freq: Three times a day (TID) | ORAL | Status: DC | PRN

## 2013-04-10 MED ORDER — NEOSTIGMINE METHYLSULFATE 1 MG/ML IJ SOLN
INTRAMUSCULAR | Status: DC | PRN
  Administered 2013-04-10: 2.5 mg via INTRAVENOUS

## 2013-04-10 MED ORDER — HYDROMORPHONE HCL PF 1 MG/ML IJ SOLN
0.5000 mg | INTRAMUSCULAR | Status: DC | PRN
  Administered 2013-04-10 (×2): 1 mg via INTRAVENOUS
  Filled 2013-04-10 (×2): qty 1

## 2013-04-10 MED ORDER — ONDANSETRON HCL 4 MG PO TABS: 4.0000 mg | ORAL_TABLET | Freq: Four times a day (QID) | ORAL | Status: DC | PRN

## 2013-04-10 MED ORDER — MENTHOL 3 MG MT LOZG: 1.0000 | LOZENGE | OROMUCOSAL | Status: DC | PRN

## 2013-04-10 MED ORDER — SUCCINYLCHOLINE CHLORIDE 20 MG/ML IJ SOLN
INTRAMUSCULAR | Status: DC | PRN
  Administered 2013-04-10: 100 mg via INTRAVENOUS

## 2013-04-10 MED ORDER — CEFAZOLIN SODIUM-DEXTROSE 2-3 GM-% IV SOLR
2.0000 g | Freq: Four times a day (QID) | INTRAVENOUS | Status: AC
Start: 2013-04-10 — End: 2013-04-11
  Administered 2013-04-10 – 2013-04-11 (×2): 2 g via INTRAVENOUS
  Filled 2013-04-10 (×2): qty 50

## 2013-04-10 MED ORDER — LACTATED RINGERS IV SOLN
INTRAVENOUS | Status: DC | PRN
  Administered 2013-04-10 (×2): via INTRAVENOUS

## 2013-04-10 MED ORDER — HYDROMORPHONE HCL PF 1 MG/ML IJ SOLN
INTRAMUSCULAR | Status: AC
Start: 2013-04-10 — End: 2013-04-10
  Administered 2013-04-10: 1 mg via INTRAVENOUS
  Filled 2013-04-10: qty 1

## 2013-04-10 MED ORDER — OXYCODONE HCL 5 MG PO TABS
5.0000 mg | ORAL_TABLET | ORAL | Status: DC
  Administered 2013-04-10 – 2013-04-11 (×2): 10 mg via ORAL
  Administered 2013-04-11: 5 mg via ORAL
  Administered 2013-04-11 – 2013-04-12 (×4): 10 mg via ORAL
  Filled 2013-04-10 (×3): qty 2
  Filled 2013-04-10: qty 1
  Filled 2013-04-10: qty 2
  Filled 2013-04-10: qty 1
  Filled 2013-04-10 (×2): qty 2

## 2013-04-10 MED ORDER — ASPIRIN EC 325 MG PO TBEC
325.0000 mg | DELAYED_RELEASE_TABLET | Freq: Two times a day (BID) | ORAL | Status: DC
  Administered 2013-04-11 – 2013-04-12 (×3): 325 mg via ORAL
  Filled 2013-04-10 (×5): qty 1

## 2013-04-10 MED ORDER — CEFAZOLIN SODIUM-DEXTROSE 2-3 GM-% IV SOLR
2.0000 g | INTRAVENOUS | Status: AC
  Administered 2013-04-10: 2 g via INTRAVENOUS

## 2013-04-10 MED ORDER — METHOCARBAMOL 500 MG PO TABS
500.0000 mg | ORAL_TABLET | Freq: Four times a day (QID) | ORAL | Status: DC | PRN
  Administered 2013-04-11 – 2013-04-12 (×4): 500 mg via ORAL
  Filled 2013-04-10 (×4): qty 1

## 2013-04-10 MED ORDER — ONDANSETRON HCL 4 MG/2ML IJ SOLN: 4.0000 mg | Freq: Four times a day (QID) | INTRAMUSCULAR | Status: DC | PRN

## 2013-04-10 MED ORDER — MIDAZOLAM HCL 5 MG/5ML IJ SOLN
INTRAMUSCULAR | Status: DC | PRN
  Administered 2013-04-10: 2 mg via INTRAVENOUS

## 2013-04-10 MED ORDER — ZOLPIDEM TARTRATE 5 MG PO TABS: 5.0000 mg | ORAL_TABLET | Freq: Every evening | ORAL | Status: DC | PRN

## 2013-04-10 MED ORDER — FERROUS SULFATE 325 (65 FE) MG PO TABS
325.0000 mg | ORAL_TABLET | Freq: Three times a day (TID) | ORAL | Status: DC
  Administered 2013-04-11 – 2013-04-12 (×3): 325 mg via ORAL
  Filled 2013-04-10 (×8): qty 1

## 2013-04-10 MED ORDER — GLYCOPYRROLATE 0.2 MG/ML IJ SOLN
INTRAMUSCULAR | Status: DC | PRN
  Administered 2013-04-10: 0.4 mg via INTRAVENOUS

## 2013-04-10 MED ORDER — LACTATED RINGERS IV SOLN
INTRAVENOUS | Status: DC
  Administered 2013-04-10: 1000 mL via INTRAVENOUS

## 2013-04-10 MED ORDER — SODIUM CHLORIDE 0.9 % IV SOLN
1000.0000 mg | Freq: Once | INTRAVENOUS | Status: AC
  Administered 2013-04-10: 1000 mg via INTRAVENOUS
  Filled 2013-04-10: qty 10

## 2013-04-10 MED ORDER — KETAMINE HCL 50 MG/ML IJ SOLN
INTRAMUSCULAR | Status: DC | PRN
  Administered 2013-04-10 (×5): 10 mg via INTRAMUSCULAR

## 2013-04-10 MED ORDER — METOCLOPRAMIDE HCL 5 MG/ML IJ SOLN
5.0000 mg | Freq: Three times a day (TID) | INTRAMUSCULAR | Status: DC | PRN
  Administered 2013-04-10: 23:00:00 10 mg via INTRAVENOUS
  Filled 2013-04-10: qty 2

## 2013-04-10 MED ORDER — ROCURONIUM BROMIDE 100 MG/10ML IV SOLN
INTRAVENOUS | Status: DC | PRN
  Administered 2013-04-10: 30 mg via INTRAVENOUS

## 2013-04-10 MED ORDER — 0.9 % SODIUM CHLORIDE (POUR BTL) OPTIME
TOPICAL | Status: DC | PRN
  Administered 2013-04-10: 1000 mL

## 2013-04-10 MED ORDER — LACTATED RINGERS IV SOLN: INTRAVENOUS | Status: DC

## 2013-04-10 MED ORDER — DEXAMETHASONE SODIUM PHOSPHATE 10 MG/ML IJ SOLN
INTRAMUSCULAR | Status: DC | PRN
  Administered 2013-04-10: 10 mg via INTRAVENOUS

## 2013-04-10 MED ORDER — SODIUM CHLORIDE 0.9 % IV SOLN
100.0000 mL/h | INTRAVENOUS | Status: DC
  Administered 2013-04-10 – 2013-04-11 (×3): 100 mL/h via INTRAVENOUS
  Filled 2013-04-10 (×7): qty 1000

## 2013-04-10 MED ORDER — LIDOCAINE HCL (CARDIAC) 20 MG/ML IV SOLN
INTRAVENOUS | Status: DC | PRN
  Administered 2013-04-10: 50 mg via INTRAVENOUS

## 2013-04-10 MED ORDER — DIPHENHYDRAMINE HCL 25 MG PO CAPS
25.0000 mg | ORAL_CAPSULE | Freq: Four times a day (QID) | ORAL | Status: DC | PRN
  Administered 2013-04-10 – 2013-04-12 (×2): 25 mg via ORAL
  Filled 2013-04-10 (×2): qty 1

## 2013-04-10 MED ORDER — PROMETHAZINE HCL 25 MG/ML IJ SOLN: 6.2500 mg | INTRAMUSCULAR | Status: DC | PRN

## 2013-04-10 MED ORDER — CELECOXIB 200 MG PO CAPS
200.0000 mg | ORAL_CAPSULE | Freq: Two times a day (BID) | ORAL | Status: DC
  Administered 2013-04-10 – 2013-04-12 (×4): 200 mg via ORAL
  Filled 2013-04-10 (×5): qty 1

## 2013-04-10 MED ORDER — NICOTINE 21 MG/24HR TD PT24
21.0000 mg | MEDICATED_PATCH | Freq: Every day | TRANSDERMAL | Status: DC
  Filled 2013-04-10 (×3): qty 1

## 2013-04-10 MED ORDER — ALUM & MAG HYDROXIDE-SIMETH 200-200-20 MG/5ML PO SUSP
30.0000 mL | ORAL | Status: DC | PRN
  Administered 2013-04-11: 30 mL via ORAL
  Filled 2013-04-10: qty 30

## 2013-04-10 MED ORDER — PHENOL 1.4 % MT LIQD: 1.0000 | OROMUCOSAL | Status: DC | PRN

## 2013-04-10 MED ORDER — PROPOFOL 10 MG/ML IV BOLUS
INTRAVENOUS | Status: DC | PRN
  Administered 2013-04-10: 150 mg via INTRAVENOUS

## 2013-04-10 MED ORDER — DEXAMETHASONE SODIUM PHOSPHATE 10 MG/ML IJ SOLN
10.0000 mg | Freq: Once | INTRAMUSCULAR | Status: AC
  Administered 2013-04-11: 10 mg via INTRAVENOUS
  Filled 2013-04-10: qty 1

## 2013-04-10 SURGICAL SUPPLY — 38 items
BAG ZIPLOCK 12X15 (MISCELLANEOUS) ×4 IMPLANT
BLADE SAW SGTL 18X1.27X75 (BLADE) ×2 IMPLANT
CAPT HIP PF COP ×2 IMPLANT
CLOTH BEACON ORANGE TIMEOUT ST (SAFETY) ×2 IMPLANT
DERMABOND ADVANCED (GAUZE/BANDAGES/DRESSINGS) ×1
DERMABOND ADVANCED .7 DNX12 (GAUZE/BANDAGES/DRESSINGS) ×1 IMPLANT
DRAPE C-ARM 42X120 X-RAY (DRAPES) ×2 IMPLANT
DRAPE STERI IOBAN 125X83 (DRAPES) ×2 IMPLANT
DRAPE U-SHAPE 47X51 STRL (DRAPES) ×6 IMPLANT
DRSG AQUACEL AG ADV 3.5X10 (GAUZE/BANDAGES/DRESSINGS) ×2 IMPLANT
DRSG TEGADERM 4X4.75 (GAUZE/BANDAGES/DRESSINGS) IMPLANT
DURAPREP 26ML APPLICATOR (WOUND CARE) ×2 IMPLANT
ELECT BLADE TIP CTD 4 INCH (ELECTRODE) ×2 IMPLANT
ELECT REM PT RETURN 9FT ADLT (ELECTROSURGICAL) ×2
ELECTRODE REM PT RTRN 9FT ADLT (ELECTROSURGICAL) ×1 IMPLANT
EVACUATOR 1/8 PVC DRAIN (DRAIN) IMPLANT
FACESHIELD LNG OPTICON STERILE (SAFETY) ×8 IMPLANT
GAUZE SPONGE 2X2 8PLY STRL LF (GAUZE/BANDAGES/DRESSINGS) ×1 IMPLANT
GLOVE BIOGEL PI IND STRL 7.5 (GLOVE) ×1 IMPLANT
GLOVE BIOGEL PI IND STRL 8 (GLOVE) ×1 IMPLANT
GLOVE BIOGEL PI INDICATOR 7.5 (GLOVE) ×1
GLOVE BIOGEL PI INDICATOR 8 (GLOVE) ×1
GLOVE ECLIPSE 8.0 STRL XLNG CF (GLOVE) ×2 IMPLANT
GLOVE ORTHO TXT STRL SZ7.5 (GLOVE) ×4 IMPLANT
GOWN BRE IMP PREV XXLGXLNG (GOWN DISPOSABLE) ×2 IMPLANT
GOWN STRL NON-REIN LRG LVL3 (GOWN DISPOSABLE) ×2 IMPLANT
KIT BASIN OR (CUSTOM PROCEDURE TRAY) ×2 IMPLANT
PACK TOTAL JOINT (CUSTOM PROCEDURE TRAY) ×2 IMPLANT
PADDING CAST COTTON 6X4 STRL (CAST SUPPLIES) ×2 IMPLANT
SPONGE GAUZE 2X2 STER 10/PKG (GAUZE/BANDAGES/DRESSINGS) ×1
SUCTION FRAZIER 12FR DISP (SUCTIONS) ×2 IMPLANT
SUT MNCRL AB 4-0 PS2 18 (SUTURE) ×2 IMPLANT
SUT VIC AB 1 CT1 36 (SUTURE) ×8 IMPLANT
SUT VIC AB 2-0 CT1 27 (SUTURE) ×2
SUT VIC AB 2-0 CT1 TAPERPNT 27 (SUTURE) ×2 IMPLANT
SUT VLOC 180 0 24IN GS25 (SUTURE) ×2 IMPLANT
TOWEL OR 17X26 10 PK STRL BLUE (TOWEL DISPOSABLE) ×4 IMPLANT
TRAY FOLEY CATH 14FRSI W/METER (CATHETERS) ×2 IMPLANT

## 2013-04-10 NOTE — Transfer of Care (Signed)
Immediate Anesthesia Transfer of Care Note  Patient: Jaime Phillips  Procedure(s) Performed: Procedure(s): RIGHT TOTAL HIP ARTHROPLASTY ANTERIOR APPROACH (Right)  Patient Location: PACU  Anesthesia Type:General  Level of Consciousness: awake, alert  and oriented  Airway & Oxygen Therapy: Patient Spontanous Breathing and Patient connected to face mask oxygen  Post-op Assessment: Report given to PACU RN and Post -op Vital signs reviewed and stable  Post vital signs: Reviewed and stable  Complications: No apparent anesthesia complications

## 2013-04-10 NOTE — Anesthesia Preprocedure Evaluation (Addendum)
Anesthesia Evaluation  Patient identified by MRN, date of birth, ID band Patient awake    Reviewed: Allergy & Precautions, H&P , NPO status , Patient's Chart, lab work & pertinent test results  Airway Mallampati: II      Dental  (+) Teeth Intact, Chipped and Dental Advisory Given,    Pulmonary neg pulmonary ROS, Current Smoker,    Pulmonary exam normal       Cardiovascular negative cardio ROS  Rhythm:Regular Rate:Normal     Neuro/Psych negative neurological ROS  negative psych ROS   GI/Hepatic negative GI ROS, Neg liver ROS,   Endo/Other  negative endocrine ROS  Renal/GU negative Renal ROS  negative genitourinary   Musculoskeletal negative musculoskeletal ROS (+)   Abdominal   Peds  Hematology negative hematology ROS (+)   Anesthesia Other Findings Patient drank coffee with milk at 07:30.   Reproductive/Obstetrics negative OB ROS                           Anesthesia Physical Anesthesia Plan  ASA: II  Anesthesia Plan: General   Post-op Pain Management:    Induction: Intravenous, Rapid sequence and Cricoid pressure planned  Airway Management Planned: Oral ETT  Additional Equipment:   Intra-op Plan:   Post-operative Plan: Extubation in OR  Informed Consent: I have reviewed the patients History and Physical, chart, labs and discussed the procedure including the risks, benefits and alternatives for the proposed anesthesia with the patient or authorized representative who has indicated his/her understanding and acceptance.   Dental advisory given  Plan Discussed with: CRNA  Anesthesia Plan Comments:         Anesthesia Quick Evaluation

## 2013-04-10 NOTE — Preoperative (Signed)
Beta Blockers   Reason not to administer Beta Blockers:Not Applicable 

## 2013-04-10 NOTE — Op Note (Signed)
NAME:  Jaime Phillips                ACCOUNT NO.: [id number]      MEDICAL RECORD NO.: [id number]      FACILITY:  [facility]      PHYSICIAN:  Durene Romans D  DATE OF BIRTH:  [birth year]     DATE OF PROCEDURE:  04/10/2013                                 OPERATIVE REPORT         PREOPERATIVE DIAGNOSIS: Right  hip osteoarthritis.      POSTOPERATIVE DIAGNOSIS:  Right hip osteoarthritis.      PROCEDURE:  Right total hip replacement through an anterior approach   utilizing DePuy THR system, component size 50mm pinnacle cup, a size 32+4 neutral   Altrex liner, a size 4 Hi Tri Lock stem with a 32+1 delta ceramic   ball.      SURGEON:  Madlyn Frankel. Charlann Boxer, M.D.      ASSISTANT:  Leilani Able, PA-C      ANESTHESIA:  General.      SPECIMENS:  None.      COMPLICATIONS:  None.      BLOOD LOSS:  300 cc     DRAINS:  One Hemovac.      INDICATION OF THE PROCEDURE:  Jaime Phillips is a 52 y.o. female who had   presented to office for evaluation of right hip pain.  Radiographs revealed   progressive degenerative changes with bone-on-bone   articulation to the  hip joint.  The patient had painful limited range of   motion significantly affecting their overall quality of life.  The patient was failing to    respond to conservative measures, and at this point was ready   to proceed with more definitive measures.  The patient has noted progressive   degenerative changes in his hip, progressive problems and dysfunction   with regarding the hip prior to surgery.  Consent was obtained for   benefit of pain relief.  Specific risk of infection, DVT, component   failure, dislocation, need for revision surgery, as well discussion of   the anterior versus posterior approach were reviewed.  Consent was   obtained for benefit of anterior pain relief through an anterior   approach.      PROCEDURE IN DETAIL:  The patient was brought to operative theater.   Once adequate anesthesia,  preoperative antibiotics, 2gm Ancef administered.   The patient was positioned supine on the OSI Hanna table.  Once adequate   padding of boney process was carried out, we had predraped out the hip, and  used fluoroscopy to confirm orientation of the pelvis and position.      The right hip was then prepped and draped from proximal iliac crest to   mid thigh with shower curtain technique.      Time-out was performed identifying the patient, planned procedure, and   extremity.     An incision was then made 2 cm distal and lateral to the   anterior superior iliac spine extending over the orientation of the   tensor fascia lata muscle and sharp dissection was carried down to the   fascia of the muscle and protractor placed in the soft tissues.      The fascia was then incised.  The muscle belly was identified and swept  laterally and retractor placed along the superior neck.  Following   cauterization of the circumflex vessels and removing some pericapsular   fat, a second cobra retractor was placed on the inferior neck.  A third   retractor was placed on the anterior acetabulum after elevating the   anterior rectus.  A L-capsulotomy was along the line of the   superior neck to the trochanteric fossa, then extended proximally and   distally.  Tag sutures were placed and the retractors were then placed   intracapsular.  We then identified the trochanteric fossa and   orientation of my neck cut, confirmed this radiographically   and then made a neck osteotomy with the femur on traction.  The femoral   head was removed without difficulty or complication.  Traction was let   off and retractors were placed posterior and anterior around the   acetabulum.      The labrum and foveal tissue were debrided.  I began reaming with a 47mm   reamer and reamed up to 49mm reamer with good bony bed preparation and a 50   cup was chosen.  The final 50mm Pinnacle cup was then impacted under fluoroscopy  to  confirm the depth of penetration and orientation with respect to   abduction.  A screw was placed followed by the hole eliminator.  The final   32+4 neutral Altrex liner was impacted with good visualized rim fit.  The cup was positioned anatomically within the acetabular portion of the pelvis.      At this point, the femur was rolled at 80 degrees.  Further capsule was   released off the inferior aspect of the femoral neck.  I then   released the superior capsule proximally.  The hook was placed laterally   along the femur and elevated manually and held in position with the bed   hook.  The leg was then extended and adducted with the leg rolled to 100   degrees of external rotation.  Once the proximal femur was fully   exposed, I used a box osteotome to set orientation.  I then began   broaching with the starting chili pepper broach and passed this by hand and then broached up to 4.  With the 4 broach in place I chose a high offset neck and did a trial reduction.  The offset was appropriate, leg lengths   appeared to be equal, confirmed radiographically.   Given these findings, I went ahead and dislocated the hip, repositioned all   retractors and positioned the right hip in the extended and abducted position.  The final 4 Hi Tri Lock stem was   chosen and it was impacted down to the level of neck cut.  Based on this   and the trial reduction, a 32+1 delta ceramic ball was chosen and   impacted onto a clean and dry trunnion, and the hip was reduced.  The   hip had been irrigated throughout the case again at this point.  I did   reapproximate the superior capsular leaflet to the anterior leaflet   using #1 Vicryl, placed a medium Hemovac drain deep.  The fascia of the   tensor fascia lata muscle was then reapproximated using #1 Vicryl.  The   remaining wound was closed with 2-0 Vicryl and running 4-0 Monocryl.   The hip was cleaned, dried, and dressed sterilely using Dermabond and   Aquacel  dressing.  Drain site dressed separately.  She was  then brought   to recovery room in stable condition tolerating the procedure well.    Leilani Able, PA-C was present for the entirety of the case involved from   preoperative positioning, perioperative retractor management, general   facilitation of the case, as well as primary wound closure as assistant.            Madlyn Frankel Charlann Boxer, M.D.            MDO/MEDQ  D:  05/25/2011  T:  05/25/2011  Job:  [id number]      Electronically Signed by Durene Romans M.D. on 05/31/2011 09:15:38 AM

## 2013-04-10 NOTE — Interval H&P Note (Signed)
History and Physical Interval Note:  04/10/2013 1:59 PM  Jaime Phillips  has presented today for surgery, with the diagnosis of ostoeoarthritis right Hip  The various methods of treatment have been discussed with the patient and family. After consideration of risks, benefits and other options for treatment, the patient has consented to  Procedure(s): RIGHT TOTAL HIP ARTHROPLASTY ANTERIOR APPROACH (Right) as a surgical intervention .  The patient's history has been reviewed, patient examined, no change in status, stable for surgery.  I have reviewed the patient's chart and labs.  Questions were answered to the patient's satisfaction.     Shelda Pal

## 2013-04-11 ENCOUNTER — Encounter (HOSPITAL_COMMUNITY): Payer: Self-pay | Admitting: Orthopedic Surgery

## 2013-04-11 DIAGNOSIS — D649 Anemia, unspecified: Secondary | ICD-10-CM | POA: Diagnosis not present

## 2013-04-11 LAB — CBC
HCT: 32.8 % — ABNORMAL LOW (ref 36.0–46.0)
Hemoglobin: 11.2 g/dL — ABNORMAL LOW (ref 12.0–15.0)
MCHC: 34.1 g/dL (ref 30.0–36.0)
RBC: 3.08 MIL/uL — ABNORMAL LOW (ref 3.87–5.11)
WBC: 8.3 10*3/uL (ref 4.0–10.5)

## 2013-04-11 LAB — BASIC METABOLIC PANEL
BUN: 7 mg/dL (ref 6–23)
CO2: 22 mEq/L (ref 19–32)
Chloride: 104 mEq/L (ref 96–112)
GFR calc non Af Amer: >90 mL/min (ref >90)
Glucose, Bld: 133 mg/dL — ABNORMAL HIGH (ref 70–99)
Potassium: 4 mEq/L (ref 3.5–5.1)
Sodium: 136 mEq/L (ref 135–145)

## 2013-04-11 MED ORDER — RANITIDINE HCL 150 MG PO TABS
150.0000 mg | ORAL_TABLET | Freq: Two times a day (BID) | ORAL | Status: DC
  Administered 2013-04-11 – 2013-04-12 (×4): 150 mg via ORAL
  Filled 2013-04-11 (×4): qty 1

## 2013-04-11 NOTE — Progress Notes (Signed)
[facility]  [facility] is providing the following services: rw and commode  If patient discharges after hours, please call [phone].   Jaime Phillips 04/11/2013, 9:09 AM

## 2013-04-11 NOTE — Progress Notes (Signed)
Physical Therapy Treatment Patient Details Name: Jerald Villalona MRN: [id number] DOB: [birth year] Today's Date: 04/11/2013 Time: 2130-8657 PT Time Calculation (min): 23 min  PT Assessment / Plan / Recommendation  History of Present Illness pt admitted for R THA, anterior direct approach   PT Comments     Follow Up Recommendations  Home health PT     Does the patient have the potential to tolerate intense rehabilitation     Barriers to Discharge        Equipment Recommendations  Rolling Diss with 5" wheels    Recommendations for Other Services OT consult  Frequency 7X/week   Progress towards PT Goals Progress towards PT goals: Progressing toward goals  Plan Current plan remains appropriate    Precautions / Restrictions Precautions Precautions: Fall Restrictions Weight Bearing Restrictions: No Other Position/Activity Restrictions: WBAT   Pertinent Vitals/Pain 5/10; premed, ice pack provided    Mobility  Bed Mobility Bed Mobility: Supine to Sit;Sit to Supine Supine to Sit: 4: Min assist Sit to Supine: 4: Min assist Details for Bed Mobility Assistance: cues for sequence and use of L LE to self assist - physical assist for R LE Transfers Transfers: Sit to Stand;Stand to Sit Sit to Stand: 4: Min assist;From bed;From chair/3-in-1;With upper extremity assist Stand to Sit: 4: Min guard;With upper extremity assist;To bed;To chair/3-in-1 Details for Transfer Assistance: cues for LE management and use of UEs to self assist Ambulation/Gait Ambulation/Gait Assistance: 4: Min assist Ambulation Distance (Feet): 200 Feet (and 20) Assistive device: Rolling Ashe Ambulation/Gait Assistance Details: cues for sequence, posture and position from RW Gait Pattern: Step-to pattern;Step-through pattern;Antalgic;Shuffle;Trunk flexed General Gait Details: progressing to recip gait Stairs: No    Exercises     PT Diagnosis:    PT Problem List:   PT Treatment Interventions:     PT  Goals (current goals can now be found in the care plan section) Acute Rehab PT Goals Patient Stated Goal: get back to being independent; decrease pain PT Goal Formulation: With patient Time For Goal Achievement: 04/18/13 Potential to Achieve Goals: Good  Visit Information  Last PT Received On: 04/11/13 Assistance Needed: +1 History of Present Illness: pt admitted for R THA, anterior direct approach    Subjective Data  Patient Stated Goal: get back to being independent; decrease pain   Cognition  Cognition Arousal/Alertness: Awake/alert Behavior During Therapy: WFL for tasks assessed/performed Overall Cognitive Status: Within Functional Limits for tasks assessed    Balance     End of Session PT - End of Session Equipment Utilized During Treatment: Gait belt Activity Tolerance: Patient tolerated treatment well;Patient limited by pain Patient left: in bed;with call bell/phone within reach Nurse Communication: Mobility status   GP     Ivery Michalski 04/11/2013, 4:56 PM

## 2013-04-11 NOTE — Progress Notes (Signed)
   Subjective: 1 Day Post-Op Procedure(s) (LRB): RIGHT TOTAL HIP ARTHROPLASTY ANTERIOR APPROACH (Right)   Patient reports pain as mild, pain controlled. No events throughout the night.  Objective:   VITALS:   Filed Vitals:   04/11/13 0633  BP: 141/89  Pulse: 67  Temp: 98.4 F (36.9 C)  Resp: 16    Neurovascular intact Dorsiflexion/Plantar flexion intact Incision: dressing C/D/I No cellulitis present Compartment soft  LABS  Recent Labs  04/11/13 0420  HGB 11.2*  HCT 32.8*  WBC 8.3  PLT 242     Recent Labs  04/11/13 0420  NA 136  K 4.0  BUN 7  CREATININE 0.56  GLUCOSE 133*     Assessment/Plan: 1 Day Post-Op Procedure(s) (LRB): RIGHT TOTAL HIP ARTHROPLASTY ANTERIOR APPROACH (Right) Foley cath d/c'ed Advance diet Up with therapy D/C IV fluids Discharge home with home health, eventually when ready  Expected ABLA  Treated with iron and will observe      Anastasio Auerbach. Ulysess Witz   PAC  04/11/2013, 11:26 AM

## 2013-04-11 NOTE — Evaluation (Signed)
Physical Therapy Evaluation Patient Details Name: Jaime Phillips MRN: [id number] DOB: 04/13/1961 Today's Date: 04/11/2013 Time: 4098-1191 PT Time Calculation (min): 20 min  PT Assessment / Plan / Recommendation History of Present Illness     Clinical Impression  Pt s/p R THR presents with decreased R LE strength/ROM and post op pain limiting functional mobility.  Pt should progress well to d/c home with family assist and HHPT follow up.    PT Assessment  Patient needs continued PT services    Follow Up Recommendations  Home health PT    Does the patient have the potential to tolerate intense rehabilitation      Barriers to Discharge Decreased caregiver support Pt's spouse working,  Pt still working to arrange additional Psychiatric nurse with 5" wheels    Recommendations for Other Services OT consult   Frequency 7X/week    Precautions / Restrictions Precautions Precautions: Fall Restrictions Weight Bearing Restrictions: No Other Position/Activity Restrictions: WBAT   Pertinent Vitals/Pain 7/10; Pain meds requested, ice pack provided      Mobility  Bed Mobility Bed Mobility: Supine to Sit Supine to Sit: 3: Mod assist Details for Bed Mobility Assistance: cues for sequence and use of L LE to self assist - physical assist for L LE and to bring trunk upright Transfers Transfers: Sit to Stand;Stand to Sit Sit to Stand: 4: Min assist;3: Mod assist;From bed;From chair/3-in-1;With upper extremity assist;With armrests Stand to Sit: 4: Min assist;With upper extremity assist;To chair/3-in-1;With armrests Details for Transfer Assistance: cues for LE management and use of UEs to self assist Ambulation/Gait Ambulation/Gait Assistance: 4: Min assist Ambulation Distance (Feet): 18 Feet (twice) Assistive device: Rolling Sprinkle Ambulation/Gait Assistance Details: cues for posture, sequence and position from RW Gait Pattern: Step-to  pattern;Decreased step length - right;Decreased step length - left;Shuffle;Trunk flexed Stairs: No    Exercises     PT Diagnosis: Difficulty walking  PT Problem List: Decreased activity tolerance;Decreased range of motion;Decreased strength;Decreased mobility;Decreased knowledge of use of DME;Pain PT Treatment Interventions: DME instruction;Gait training;Stair training;Functional mobility training;Therapeutic activities;Therapeutic exercise;Patient/family education     PT Goals(Current goals can be found in the care plan section) Acute Rehab PT Goals Patient Stated Goal: Resume previous lifestyle with decreased pain PT Goal Formulation: With patient Time For Goal Achievement: 04/18/13 Potential to Achieve Goals: Good  Visit Information  Last PT Received On: 04/11/13 Assistance Needed: +1       Prior Functioning  Home Living Family/patient expects to be discharged to:: Private residence Living Arrangements: Spouse/significant other Available Help at Discharge: Family Type of Home: House Home Access: Stairs to enter Secretary/administrator of Steps: 2 Entrance Stairs-Rails: None Home Layout: One level Home Equipment: Cane - single point Prior Function Level of Independence: Independent with assistive device(s) Communication Communication: No difficulties    Cognition  Cognition Arousal/Alertness: Awake/alert Behavior During Therapy: WFL for tasks assessed/performed Overall Cognitive Status: Within Functional Limits for tasks assessed    Extremity/Trunk Assessment Upper Extremity Assessment Upper Extremity Assessment: Overall WFL for tasks assessed Lower Extremity Assessment Lower Extremity Assessment: RLE deficits/detail RLE Deficits / Details: pain ltd all planes at hip Cervical / Trunk Assessment Cervical / Trunk Assessment: Normal   Balance    End of Session PT - End of Session Equipment Utilized During Treatment: Gait belt Activity Tolerance: Patient tolerated  treatment well;Patient limited by pain Patient left: in chair;with call bell/phone within reach Nurse Communication: Mobility status;Patient requests pain meds  GP  Dauna Ziska 04/11/2013, 8:51 AM

## 2013-04-11 NOTE — Evaluation (Signed)
Occupational Therapy Evaluation Patient Details Name: Jaime Phillips MRN: [id number] DOB: Mar 03, 1961 Today's Date: 04/11/2013 Time: 1478-2956 OT Time Calculation (min): 24 min  OT Assessment / Plan / Recommendation History of present illness pt admitted for R THA, anterior direct approach   Clinical Impression   Pt admitted for the above surgery.  She will benefit from skilled OT to increase independence with adls.  Goals in acute are for overall min guard level.    OT Assessment  Patient needs continued OT Services    Follow Up Recommendations  No OT follow up (likely)    Barriers to Discharge      Equipment Recommendations  3 in 1 bedside comode    Recommendations for Other Services    Frequency  Min 2X/week    Precautions / Restrictions Precautions Precautions: Fall Restrictions Weight Bearing Restrictions: No Other Position/Activity Restrictions: WBAT   Pertinent Vitals/Pain 8/10 R hip    ADL  Grooming: Set up Where Assessed - Grooming: Unsupported sitting Upper Body Bathing: Set up Where Assessed - Upper Body Bathing: Unsupported sitting Lower Body Bathing: Moderate assistance Where Assessed - Lower Body Bathing: Supported sit to stand Upper Body Dressing: Set up Where Assessed - Upper Body Dressing: Unsupported sitting Lower Body Dressing: Moderate assistance (with AE) Where Assessed - Lower Body Dressing: Supported sit to stand Toilet Transfer: Simulated;Minimal assistance;Moderate assistance Toilet Transfer Method: Sit to Barista:  (chair) Toileting - Clothing Manipulation and Hygiene: Minimal assistance Where Assessed - Engineer, mining and Hygiene: Sit to stand from 3-in-1 or toilet Equipment Used: Rolling Bless Transfers/Ambulation Related to ADLs: pt plans to sponge bathe initially ADL Comments: issued AE kit.  Pt practiced with reacher and sock aid.  Needs additional practice; was limited by pain    OT  Diagnosis: Generalized weakness  OT Problem List: Decreased strength;Decreased activity tolerance;Pain;Decreased knowledge of use of DME or AE OT Treatment Interventions: Self-care/ADL training;DME and/or AE instruction;Patient/family education   OT Goals(Current goals can be found in the care plan section) Acute Rehab OT Goals Patient Stated Goal: get back to being independent; decrease pain OT Goal Formulation: With patient Time For Goal Achievement: 04/18/13 Potential to Achieve Goals: Good ADL Goals Pt Will Perform Lower Body Bathing: with min assist;with adaptive equipment;sit to/from stand Pt Will Perform Lower Body Dressing: with min assist;with adaptive equipment;sit to/from stand Pt Will Transfer to Toilet: with min guard assist;ambulating;bedside commode Pt Will Perform Toileting - Clothing Manipulation and hygiene: with min guard assist;sit to/from stand  Visit Information  Last OT Received On: 04/11/13 Assistance Needed: +1 History of Present Illness: pt admitted for R THA, anterior direct approach       Prior Functioning     Home Living Family/patient expects to be discharged to:: Private residence Living Arrangements: Spouse/significant other Available Help at Discharge: Family Type of Home: House Home Access: Stairs to enter Secretary/administrator of Steps: 2 Entrance Stairs-Rails: None Home Layout: One level Home Equipment: Cane - single point Prior Function Level of Independence: Independent with assistive device(s) Communication Communication: No difficulties         Vision/Perception     Cognition  Cognition Arousal/Alertness: Awake/alert Behavior During Therapy: WFL for tasks assessed/performed Overall Cognitive Status: Within Functional Limits for tasks assessed    Extremity/Trunk Assessment Upper Extremity Assessment Upper Extremity Assessment: Overall WFL for tasks assessed Cervical / Trunk Assessment Cervical / Trunk Assessment: Normal      Mobility  Transfers Sit to Stand: 4: Min assist;3: Mod assist;From  bed;From chair/3-in-1;With upper extremity assist;With armrests Stand to Sit: 4: Min assist;With upper extremity assist;To chair/3-in-1;With armrests Details for Transfer Assistance: cues for LE management and use of UEs to self assist     Exercise     Balance     End of Session OT - End of Session Activity Tolerance: Patient limited by pain Patient left: in bed;with call bell/phone within reach  GO     [facility] 04/11/2013, 11:58 AM Marica Otter, OTR/L [phone] 04/11/2013

## 2013-04-11 NOTE — Progress Notes (Signed)
Physical Therapy Treatment Patient Details Name: Jaime Phillips MRN: [id number] DOB: April 12, 1961 Today's Date: 04/11/2013 Time: 1050-1110 PT Time Calculation (min): 20 min  PT Assessment / Plan / Recommendation  History of Present Illness pt admitted for R THA, anterior direct approach   PT Comments     Follow Up Recommendations  Home health PT     Does the patient have the potential to tolerate intense rehabilitation     Barriers to Discharge Decreased caregiver support Pt's spouse working,  Pt still working to arrange additional Psychiatric nurse with 5" wheels    Recommendations for Other Services OT consult  Frequency 7X/week   Progress towards PT Goals    Plan Current plan remains appropriate    Precautions / Restrictions Precautions Precautions: Fall Restrictions Weight Bearing Restrictions: No Other Position/Activity Restrictions: WBAT   Pertinent Vitals/Pain 6/10; premed, ice pack provided    Mobility  Bed Mobility Bed Mobility: Sit to Supine Supine to Sit: 3: Mod assist Sit to Supine: 4: Min assist;3: Mod assist Details for Bed Mobility Assistance: cues for sequence and use of L LE to self assist - physical assist for L LE and to bring trunk upright Transfers Transfers: Sit to Stand;Stand to Sit Sit to Stand: 4: Min assist;3: Mod assist;From bed;From chair/3-in-1;With upper extremity assist;With armrests Stand to Sit: 4: Min assist;With upper extremity assist;To chair/3-in-1;With armrests Details for Transfer Assistance: cues for LE management and use of UEs to self assist Ambulation/Gait Ambulation/Gait Assistance: 4: Min assist Ambulation Distance (Feet): 100 Feet Assistive device: Rolling [place] Ambulation/Gait Assistance Details: cues for posture, pace and position from RW Gait Pattern: Step-to pattern;Step-through pattern;Antalgic;Shuffle;Trunk flexed Stairs: No    Exercises     PT Diagnosis: Difficulty walking   PT Problem List: Decreased activity tolerance;Decreased range of motion;Decreased strength;Decreased mobility;Decreased knowledge of use of DME;Pain PT Treatment Interventions: DME instruction;Gait training;Stair training;Functional mobility training;Therapeutic activities;Therapeutic exercise;Patient/family education   PT Goals (current goals can now be found in the care plan section) Acute Rehab PT Goals Patient Stated Goal: get back to being independent; decrease pain PT Goal Formulation: With patient Time For Goal Achievement: 04/18/13 Potential to Achieve Goals: Good  Visit Information  Last PT Received On: 04/11/13 Assistance Needed: +1 History of Present Illness: pt admitted for R THA, anterior direct approach    Subjective Data  Patient Stated Goal: get back to being independent; decrease pain   Cognition  Cognition Arousal/Alertness: Awake/alert Behavior During Therapy: WFL for tasks assessed/performed Overall Cognitive Status: Within Functional Limits for tasks assessed    Balance     End of Session PT - End of Session Equipment Utilized During Treatment: Gait belt Activity Tolerance: Patient tolerated treatment well;Patient limited by pain Patient left: in bed;with call bell/phone within reach Nurse Communication: Mobility status   GP     Nils Thor 04/11/2013, 12:12 PM

## 2013-04-12 LAB — CBC
HCT: 32.1 % — ABNORMAL LOW (ref 36.0–46.0)
Hemoglobin: 10.5 g/dL — ABNORMAL LOW (ref 12.0–15.0)
MCH: 35.2 pg — ABNORMAL HIGH (ref 26.0–34.0)
MCHC: 32.7 g/dL (ref 30.0–36.0)
RBC: 2.98 MIL/uL — ABNORMAL LOW (ref 3.87–5.11)

## 2013-04-12 LAB — BASIC METABOLIC PANEL
BUN: 11 mg/dL (ref 6–23)
CO2: 23 mEq/L (ref 19–32)
Calcium: 8.9 mg/dL (ref 8.4–10.5)
GFR calc non Af Amer: >90 mL/min (ref >90)
Glucose, Bld: 106 mg/dL — ABNORMAL HIGH (ref 70–99)
Sodium: 134 mEq/L — ABNORMAL LOW (ref 135–145)

## 2013-04-12 MED ORDER — OXYCODONE HCL 5 MG PO TABS: 5.0000 mg | ORAL_TABLET | ORAL | Status: DC | PRN

## 2013-04-12 MED ORDER — METHOCARBAMOL 500 MG PO TABS: 500.0000 mg | ORAL_TABLET | Freq: Four times a day (QID) | ORAL | Status: DC | PRN

## 2013-04-12 MED ORDER — FERROUS SULFATE 325 (65 FE) MG PO TABS: 325.0000 mg | ORAL_TABLET | Freq: Three times a day (TID) | ORAL | Status: DC

## 2013-04-12 MED ORDER — DSS 100 MG PO CAPS: 100.0000 mg | ORAL_CAPSULE | Freq: Two times a day (BID) | ORAL | Status: DC

## 2013-04-12 MED ORDER — RANITIDINE HCL 150 MG PO TABS: 150.0000 mg | ORAL_TABLET | Freq: Two times a day (BID) | ORAL | Status: DC

## 2013-04-12 MED ORDER — POLYETHYLENE GLYCOL 3350 17 G PO PACK: 17.0000 g | PACK | Freq: Two times a day (BID) | ORAL | Status: DC

## 2013-04-12 MED ORDER — ASPIRIN 325 MG PO TBEC: 325.0000 mg | DELAYED_RELEASE_TABLET | Freq: Two times a day (BID) | ORAL | Status: AC

## 2013-04-12 MED ORDER — ACETAMINOPHEN 325 MG PO TABS
325.0000 mg | ORAL_TABLET | Freq: Four times a day (QID) | ORAL | Status: DC | PRN
  Administered 2013-04-12: 12:00:00 325 mg via ORAL
  Filled 2013-04-12: qty 2

## 2013-04-12 NOTE — Care Management Note (Signed)
    Page 1 of 2   04/12/2013     2:15:04 PM   CARE MANAGEMENT NOTE 04/12/2013  Patient:  Jaime Phillips, Jaime Phillips   Account Number:  [id number]  Date Initiated:  04/12/2013  Documentation initiated by:  Colleen Can  Subjective/Objective Assessment:   dx rt hip osteoarthritis; hip athroplasty-anterior approach     Action/Plan:   CM spoke with patient. Pt plans to go to her home in [place] where she will have family support.  [employer] rolling [place] has been delivered to patient's room.   Anticipated DC Date:  04/12/2013   Anticipated DC Plan:  HOME W HOME HEALTH SERVICES  In-house referral  Financial Counselor  PCP / [facility]      DC Planning Services  CM consult      [facility] Choice  DURABLE MEDICAL EQUIPMENT   Choice offered to / List presented to:     DME arranged  [place] [facility]      DME agency  [facility]     [facility] arranged  HH - 11 Patient Refused      HH agency  [facility]   Status of service:  Completed, signed off Medicare Important Message given?   (If response is "NO", the following Medicare IM given date fields will be blank) Date Medicare IM given:   Date Additional Medicare IM given:    Discharge Disposition:  HOME/SELF CARE  Per UR Regulation:  Reviewed for med. necessity/level of care/duration of stay  If discussed at Long Length of Stay Meetings, dates discussed:    Comments:  04/12/2013 Dory Peru RN CCM [phone] Pt does not have health insurance. HH agency has advised that patient does not meet guidelines for indigent care home health services which uses Medicaid guidelines. Pt states she is unable to pay out of pocket. Inpatient physical therapy/occupational therapists are working with patient and  advising of physical  when gtherapy exercises that will be important to do when she goes home. [facility]-rep  plans to call patient in a few days to follow up on pt's progress. If she feels she needs HH she will  address the issue with her. PA-Babish is aware of the above. Pt has been encouraged to continue the exercises as advised by PT.  Referral to fiancial counselor. Pt states she has not applied for Medicaid.  Pt does not have PCP.  Pt advised of [facility] on [address]. Contact information given to patient and she plans to call clinic for more information. Pt states she will be able to pay for prescriptions which will cost $19.90 at [facility] [employer]. She plans to get prescriptions filled at [facility] pharmacy.

## 2013-04-12 NOTE — Progress Notes (Signed)
Occupational Therapy Treatment Patient Details Name: Jaime Phillips MRN: [id number] DOB: [birth year] Today's Date: 04/12/2013 Time: 4098-1191 OT Time Calculation (min): 12 min  OT Assessment / Plan / Recommendation  History of present illness pt admitted for R THA, anterior direct approach   OT comments  Pt doing well today and she feels like she is moving better today. Reviewed and practiced with AE for LB dressing and pt verbalizes understanding of how to use all. She was able to reach down to R foot today but states she plans to use AE PRN if having pain/stiff.   Follow Up Recommendations  No OT follow up    Barriers to Discharge       Equipment Recommendations  3 in 1 bedside comode    Recommendations for Other Services    Frequency Min 2X/week   Progress towards OT Goals Progress towards OT goals: Progressing toward goals  Plan Discharge plan remains appropriate    Precautions / Restrictions Precautions Precautions: Fall   Pertinent Vitals/Pain 4-5/10 R hip; declines meds or ice    ADL  Lower Body Dressing: Simulated;Supervision/safety (doff socks with reacher, don with sock aid) Where Assessed - Lower Body Dressing: Unsupported sitting Toilet Transfer: Simulated;Min guard (out of bathroom at sink to EOB) Transfers/Ambulation Related to ADLs: pt plans to sponge bathe initially ADL Comments: Pt did very well today and was able to reach down to R foot without AE. Pt states AE will be helpful when she feels more stiff or has pain. She demonstrated how to use sock aid and reacher to don/doff socks. Demonstrated LHS and shoe  horn for pt and pt verbalized undestanding of all AE. She states she has been on/off  3in1 and feels comfortable with transfer. Discussed how to adjust 3in1 for apprpriate height.     OT Diagnosis:    OT Problem List:   OT Treatment Interventions:     OT Goals(current goals can now be found in the care plan section)    Visit Information  Last OT  Received On: 04/12/13 Assistance Needed: +1 History of Present Illness: pt admitted for R THA, anterior direct approach    Subjective Data      Prior Functioning       Cognition  Cognition Arousal/Alertness: Awake/alert Behavior During Therapy: WFL for tasks assessed/performed Overall Cognitive Status: Within Functional Limits for tasks assessed    Mobility  Bed Mobility Bed Mobility: Sit to Supine Supine to Sit: 5: Supervision;HOB elevated Transfers Transfers: Stand to Sit Stand to Sit: 5: Supervision;To bed Details for Transfer Assistance: verbal cues to use hands to reach back.    Exercises      Balance     End of Session OT - End of Session Activity Tolerance: Patient tolerated treatment well Patient left: in bed;with call bell/phone within reach  GO     [name] [phone] 04/12/2013, 9:22 AM

## 2013-04-12 NOTE — Progress Notes (Signed)
Physical Therapy Treatment Patient Details Name: Jaime Phillips MRN: [id number] DOB: 31-Jul-1961 Today's Date: 04/12/2013 Time: 4696-2952 PT Time Calculation (min): 24 min  PT Assessment / Plan / Recommendation  History of Present Illness pt admitted for R THA, anterior direct approach   PT Comments     Follow Up Recommendations  Home health PT     Does the patient have the potential to tolerate intense rehabilitation     Barriers to Discharge        Equipment Recommendations  Rolling [place] with 5" wheels    Recommendations for Other Services OT consult  Frequency 7X/week   Progress towards PT Goals Progress towards PT goals: Progressing toward goals  Plan Current plan remains appropriate    Precautions / Restrictions Precautions Precautions: Fall Restrictions Weight Bearing Restrictions: No Other Position/Activity Restrictions: WBAT   Pertinent Vitals/Pain 5/10; ice packs provided    Mobility  Bed Mobility Bed Mobility: Supine to Sit;Sit to Supine Supine to Sit: 5: Supervision;HOB flat Sit to Supine: 4: Min guard;HOB flat Details for Bed Mobility Assistance: cues for sequence and use of L LE to self assist - Transfers Transfers: Sit to Stand;Stand to Sit Sit to Stand: 5: Supervision;From bed Stand to Sit: 5: Supervision;To bed Details for Transfer Assistance: verbal cues to use hands to reach back. Ambulation/Gait Ambulation/Gait Assistance: 4: Min guard;5: Supervision Ambulation Distance (Feet): 80 Feet Assistive device: Rolling [place] Ambulation/Gait Assistance Details: min cues for posture and position from RW Gait Pattern: Step-to pattern;Step-through pattern;Antalgic;Shuffle;Trunk flexed General Gait Details: progressing to recip gait Stairs: Yes Stairs Assistance: 4: Min assist Stairs Assistance Details (indicate cue type and reason): cues for sequence and foot/RW placement Stair Management Technique: No rails;Step to pattern;Backwards;With  [place] Number of Stairs: 4    Exercises Total Joint Exercises Ankle Circles/Pumps: AROM;15 reps;Supine;Both Quad Sets: AROM;Both;10 reps;Supine Heel Slides: AAROM;20 reps;Supine;Right Hip ABduction/ADduction: AAROM;Right;15 reps;Supine   PT Diagnosis:    PT Problem List:   PT Treatment Interventions:     PT Goals (current goals can now be found in the care plan section) Acute Rehab PT Goals Patient Stated Goal: get back to being independent; decrease pain PT Goal Formulation: With patient Time For Goal Achievement: 04/18/13 Potential to Achieve Goals: Good  Visit Information  Last PT Received On: 04/12/13 Assistance Needed: +1 History of Present Illness: pt admitted for R THA, anterior direct approach    Subjective Data  Subjective: My hip is just feeling like its tightening up Patient Stated Goal: get back to being independent; decrease pain   Cognition  Cognition Arousal/Alertness: Awake/alert Behavior During Therapy: WFL for tasks assessed/performed Overall Cognitive Status: Within Functional Limits for tasks assessed    Balance     End of Session PT - End of Session Equipment Utilized During Treatment: Gait belt Activity Tolerance: Patient tolerated treatment well Patient left: in bed;with call bell/phone within reach Nurse Communication: Mobility status   GP     Jaime Phillips 04/12/2013, 11:34 AM

## 2013-04-12 NOTE — Progress Notes (Signed)
Physical Therapy Treatment Patient Details Name: Avital Dancy MRN: [id number] DOB: 1960/09/09 Today's Date: 04/12/2013 Time: 4098-1191 PT Time Calculation (min): 28 min  PT Assessment / Plan / Recommendation  History of Present Illness     PT Comments   Reviewed stairs, car transfers and home exercise program with written instructions provided  Follow Up Recommendations  Home health PT     Does the patient have the potential to tolerate intense rehabilitation     Barriers to Discharge        Equipment Recommendations  Rolling [place] with 5" wheels    Recommendations for Other Services OT consult  Frequency 7X/week   Progress towards PT Goals Progress towards PT goals: Progressing toward goals  Plan Current plan remains appropriate    Precautions / Restrictions Precautions Precautions: Fall Restrictions Weight Bearing Restrictions: No Other Position/Activity Restrictions: WBAT   Pertinent Vitals/Pain 5/10; premed, ice pack provided    Mobility  Bed Mobility Bed Mobility: Supine to Sit Supine to Sit: 5: Supervision;HOB flat Details for Bed Mobility Assistance: cues for sequence and use of L LE to self assist - Transfers Transfers: Sit to Stand;Stand to Sit Sit to Stand: 5: Supervision;From bed Stand to Sit: 5: Supervision;To chair/3-in-1 Details for Transfer Assistance: verbal cues to use hands to reach back. Ambulation/Gait Ambulation/Gait Assistance: 5: Supervision Ambulation Distance (Feet): 80 Feet Assistive device: Rolling [place] Ambulation/Gait Assistance Details: min cues for posture and position from RW Gait Pattern: Step-to pattern;Step-through pattern;Trunk flexed General Gait Details: progressing to recip gait Stairs: Yes Stairs Assistance: 4: Min assist Stairs Assistance Details (indicate cue type and reason): min cues for sequence Stair Management Technique: No rails;Step to pattern;Backwards;With [place] Number of Stairs: 2    Exercises Total  Joint Exercises Ankle Circles/Pumps: AROM;15 reps;Supine;Both Quad Sets: AROM;Both;10 reps;Supine Heel Slides: AAROM;20 reps;Supine;Right Hip ABduction/ADduction: AAROM;Right;15 reps;Supine Long Arc Quad: AROM;15 reps;Supine;Right   PT Diagnosis:    PT Problem List:   PT Treatment Interventions:     PT Goals (current goals can now be found in the care plan section) Acute Rehab PT Goals Patient Stated Goal: get back to being independent; decrease pain PT Goal Formulation: With patient Time For Goal Achievement: 04/18/13 Potential to Achieve Goals: Good  Visit Information  Last PT Received On: 04/12/13 Assistance Needed: +1    Subjective Data  Patient Stated Goal: get back to being independent; decrease pain   Cognition  Cognition Arousal/Alertness: Awake/alert Behavior During Therapy: WFL for tasks assessed/performed Overall Cognitive Status: Within Functional Limits for tasks assessed    Balance     End of Session PT - End of Session Equipment Utilized During Treatment: Gait belt Activity Tolerance: Patient tolerated treatment well Patient left: in chair;with call bell/phone within reach Nurse Communication: Mobility status   GP     Danzell Birky 04/12/2013, 4:26 PM

## 2013-04-12 NOTE — Progress Notes (Signed)
Pt stable, scripts, d/c instructions given with no questions/concerns voiced by pt.  Pt transported via wheelchair to private vehicle by NT and husband. 

## 2013-04-12 NOTE — Progress Notes (Signed)
   Subjective: 2 Days Post-Op Procedure(s) (LRB): RIGHT TOTAL HIP ARTHROPLASTY ANTERIOR APPROACH (Right)   Patient reports pain as mild, pain well controlled. Feels much better today. No events throughout the night. Ready to be discharged home.  Objective:   VITALS:   Filed Vitals:   04/12/13  BP: 167/77  Pulse: 74  Temp: 98.6 F (37 C)   Resp: 16    Neurovascular intact Dorsiflexion/Plantar flexion intact Incision: dressing C/D/I No cellulitis present Compartment soft  LABS  Recent Labs  04/11/13 0420 04/12/13 0405  HGB 11.2* 10.5*  HCT 32.8* 32.1*  WBC 8.3 9.1  PLT 242 237     Recent Labs  04/11/13 0420 04/12/13 0405  NA 136 134*  K 4.0 4.0  BUN 7 11  CREATININE 0.56 0.66  GLUCOSE 133* 106*     Assessment/Plan: 2 Days Post-Op Procedure(s) (LRB): RIGHT TOTAL HIP ARTHROPLASTY ANTERIOR APPROACH (Right) Up with therapy Discharge home with home health Follow up in 2 weeks at [facility]. Follow up with OLIN,Onnie Alatorre D in 2 weeks.  Contact information:  [facility] [address] [place] [address] [phone]    Expected ABLA  Treated with iron and will observe      Anastasio Auerbach. Lianah Peed   PAC  04/12/2013, 9:35 AM

## 2013-04-13 NOTE — Discharge Summary (Signed)
Physician Discharge Summary  Patient ID: Jaime Phillips MRN: [id number] DOB/AGE: 53-Oct-1962 53 y.o.  Admit date: 04/10/2013 Discharge date: 04/12/2013   Procedures:  Procedure(s) (LRB): RIGHT TOTAL HIP ARTHROPLASTY ANTERIOR APPROACH (Right)  Attending Physician:  Dr. Durene Romans   Admission Diagnoses:   Right hip OA / pain  Discharge Diagnoses:  Principal Problem:   S/P right THA, AA Active Problems:   Expected blood loss anemia  Past Medical History  Diagnosis Date  . Abnormal uterine bleeding   . Depression   . Arthritis   . Rectal bleeding     minor     HPI: Jaime Phillips, 52 y.o. female, has a history of pain and functional disability in the right hip(s) due to arthritis and patient has failed non-surgical conservative treatments for greater than 12 weeks to include NSAID's and/or analgesics, use of assistive devices and activity modification. Onset of symptoms was gradual starting years ago with rapidlly worsening over the last 3-4 months.The patient noted no past surgery on the right hip(s). Patient currently rates pain in the right hip at 9 out of 10 with activity. Patient has night pain, worsening of pain with activity and weight bearing, trendelenberg gait, pain that interfers with activities of daily living and pain with passive range of motion. Patient has evidence of periarticular osteophytes and joint space narrowing by imaging studies. This condition presents safety issues increasing the risk of falls. There is no current active infection. Risks, benefits and expectations were discussed with the patient. Patient understand the risks, benefits and expectations and wishes to proceed with surgery.  PCP: No primary provider on file.   Discharged Condition: good  Hospital Course:  Patient underwent the above stated procedure on 04/10/2013. Patient tolerated the procedure well and brought to the recovery room in good condition and subsequently to the floor.  POD  #1 BP: 141/89 ; Pulse: 67 ; Temp: 98.4 F (36.9 C) ; Resp: 16  Pt's foley was removed, as well as the hemovac drain removed. IV was changed to a saline lock. Patient reports pain as mild, pain controlled. No events throughout the night.  .Neurovascular intact, dorsiflexion/plantar flexion intact, incision: dressing C/D/I, no cellulitis present and compartment soft.   LABS  Basename    HGB  11.2  HCT  32.8   POD #2  BP: 167/77 ; Pulse: 74 ; Temp: 98.6 F (37 C) ; Resp: 16  Patient reports pain as mild, pain well controlled. Feels much better today. No events throughout the night. Ready to be discharged home. Neurovascular intact, dorsiflexion/plantar flexion intact, incision: dressing C/D/I, no cellulitis present and compartment soft.   LABS  Basename    HGB  10.5  HCT  32.1    Discharge Exam: General appearance: alert, cooperative and no distress Extremities: Homans sign is negative, no sign of DVT, no edema, redness or tenderness in the calves or thighs and no ulcers, gangrene or trophic changes  Disposition:   Home-Health Care Svc with follow up in 2 weeks   Follow-up Information   Follow up with Shelda Pal, MD. Schedule an appointment as soon as possible for a visit in 2 weeks.   Specialty:  Orthopedic Surgery   Contact information:   [address] [place] [address] [phone]       Discharge Orders   Future Orders Complete By Expires   Call MD / Call 911  As directed    Comments:     If you experience chest pain  or shortness of breath, CALL 911 and be transported to the hospital emergency room.  If you develope a fever above 101 F, pus (white drainage) or increased drainage or redness at the wound, or calf pain, call your surgeon's office.   Change dressing  As directed    Comments:     Maintain surgical dressing for 10-14 days, then replace with 4x4 guaze and tape. Keep the area dry and clean.   Constipation Prevention  As directed     Comments:     Drink plenty of fluids.  Prune juice may be helpful.  You may use a stool softener, such as Colace (over the counter) 100 mg twice a day.  Use MiraLax (over the counter) for constipation as needed.   Diet - low sodium heart healthy  As directed    Discharge instructions  As directed    Comments:     Maintain surgical dressing for 10-14 days, then replace with gauze and tape. Keep the area dry and clean until follow up. Follow up in 2 weeks at [facility]. Call with any questions or concerns.   Increase activity slowly as tolerated  As directed    TED hose  As directed    Comments:     Use stockings (TED hose) for 2 weeks on both leg(s).  You may remove them at night for sleeping.   Weight bearing as tolerated  As directed         Medication List    STOP taking these medications       aspirin 81 MG tablet  Replaced by:  aspirin 325 MG EC tablet     ibuprofen 200 MG tablet  Commonly known as:  ADVIL,MOTRIN     oxyCODONE-acetaminophen 5-325 MG per tablet  Commonly known as:  PERCOCET/ROXICET      TAKE these medications       acetaminophen 500 MG tablet  Commonly known as:  TYLENOL  Take 500 mg by mouth every 6 (six) hours as needed. For pain.     aspirin 325 MG EC tablet  Take 1 tablet (325 mg total) by mouth 2 (two) times daily.     CO Q 10 PO  Take by mouth daily.     DSS 100 MG Caps  Take 100 mg by mouth 2 (two) times daily.     ferrous sulfate 325 (65 FE) MG tablet  Take 1 tablet (325 mg total) by mouth 3 (three) times daily after meals.     methocarbamol 500 MG tablet  Commonly known as:  ROBAXIN  Take 1 tablet (500 mg total) by mouth every 6 (six) hours as needed (muscle spasms).     multivitamin with minerals Tabs tablet  Take 1 tablet by mouth daily.     oxyCODONE 5 MG immediate release tablet  Commonly known as:  Oxy IR/ROXICODONE  Take 1-2 tablets (5-10 mg total) by mouth every 4 (four) hours as needed for pain.      polyethylene glycol packet  Commonly known as:  MIRALAX / GLYCOLAX  Take 17 g by mouth 2 (two) times daily.     ranitidine 150 MG tablet  Commonly known as:  ZANTAC  Take 1 tablet (150 mg total) by mouth 2 (two) times daily.     vitamin E 400 UNIT capsule  Take 400 Units by mouth daily.         Signed: Anastasio Auerbach. Korinne Greenstein   PAC  04/13/2013, 8:22 AM

## 2013-04-13 NOTE — Anesthesia Postprocedure Evaluation (Signed)
Anesthesia Post Note  Patient: Jaime Phillips  Procedure(s) Performed: Procedure(s) (LRB): RIGHT TOTAL HIP ARTHROPLASTY ANTERIOR APPROACH (Right)  Anesthesia type: General  Patient location: PACU  Post pain: Pain level controlled  Post assessment: Post-op Vital signs reviewed  Last Vitals:  Filed Vitals:   04/12/13 1200  BP:   Pulse:   Temp:   Resp: 16    Post vital signs: Reviewed  Level of consciousness: sedated  Complications: No apparent anesthesia complications

## 2013-05-27 ENCOUNTER — Inpatient Hospital Stay (HOSPITAL_COMMUNITY)
Admission: EM | Admit: 2013-05-27 | Discharge: 2013-05-31 | DRG: 481 | Disposition: A | Discharge status: Rehabilitation Facility | Payer: Medicaid Other | Attending: Family Medicine | Admitting: Family Medicine

## 2013-05-27 ENCOUNTER — Emergency Department (HOSPITAL_COMMUNITY): Payer: No Typology Code available for payment source

## 2013-05-27 ENCOUNTER — Encounter (HOSPITAL_COMMUNITY): Payer: Self-pay | Admitting: Emergency Medicine

## 2013-05-27 DIAGNOSIS — F329 Major depressive disorder, single episode, unspecified: Secondary | ICD-10-CM | POA: Diagnosis present

## 2013-05-27 DIAGNOSIS — S72001A Fracture of unspecified part of neck of right femur, initial encounter for closed fracture: Secondary | ICD-10-CM

## 2013-05-27 DIAGNOSIS — F411 Generalized anxiety disorder: Secondary | ICD-10-CM | POA: Diagnosis present

## 2013-05-27 DIAGNOSIS — D539 Nutritional anemia, unspecified: Secondary | ICD-10-CM | POA: Diagnosis present

## 2013-05-27 DIAGNOSIS — S72009A Fracture of unspecified part of neck of unspecified femur, initial encounter for closed fracture: Secondary | ICD-10-CM

## 2013-05-27 DIAGNOSIS — Y92009 Unspecified place in unspecified non-institutional (private) residence as the place of occurrence of the external cause: Secondary | ICD-10-CM

## 2013-05-27 DIAGNOSIS — W010XXA Fall on same level from slipping, tripping and stumbling without subsequent striking against object, initial encounter: Secondary | ICD-10-CM | POA: Diagnosis present

## 2013-05-27 DIAGNOSIS — Z96649 Presence of unspecified artificial hip joint: Secondary | ICD-10-CM

## 2013-05-27 DIAGNOSIS — D62 Acute posthemorrhagic anemia: Secondary | ICD-10-CM | POA: Diagnosis not present

## 2013-05-27 DIAGNOSIS — F17200 Nicotine dependence, unspecified, uncomplicated: Secondary | ICD-10-CM | POA: Diagnosis present

## 2013-05-27 DIAGNOSIS — Z9181 History of falling: Secondary | ICD-10-CM

## 2013-05-27 DIAGNOSIS — T84049A Periprosthetic fracture around unspecified internal prosthetic joint, initial encounter: Principal | ICD-10-CM | POA: Diagnosis present

## 2013-05-27 DIAGNOSIS — E876 Hypokalemia: Secondary | ICD-10-CM | POA: Diagnosis not present

## 2013-05-27 DIAGNOSIS — R5381 Other malaise: Secondary | ICD-10-CM | POA: Diagnosis present

## 2013-05-27 DIAGNOSIS — F3289 Other specified depressive episodes: Secondary | ICD-10-CM | POA: Diagnosis present

## 2013-05-27 DIAGNOSIS — D649 Anemia, unspecified: Secondary | ICD-10-CM | POA: Diagnosis present

## 2013-05-27 DIAGNOSIS — K219 Gastro-esophageal reflux disease without esophagitis: Secondary | ICD-10-CM | POA: Diagnosis present

## 2013-05-27 DIAGNOSIS — R12 Heartburn: Secondary | ICD-10-CM | POA: Diagnosis not present

## 2013-05-27 DIAGNOSIS — D500 Iron deficiency anemia secondary to blood loss (chronic): Secondary | ICD-10-CM

## 2013-05-27 LAB — COMPREHENSIVE METABOLIC PANEL
ALT: 25 U/L (ref 0–35)
AST: 34 U/L (ref 0–37)
Alkaline Phosphatase: 67 U/L (ref 39–117)
CO2: 24 mEq/L (ref 19–32)
Chloride: 99 mEq/L (ref 96–112)
GFR calc Af Amer: >90 mL/min (ref >90)
GFR calc non Af Amer: >90 mL/min (ref >90)
Glucose, Bld: 101 mg/dL — ABNORMAL HIGH (ref 70–99)
Potassium: 2.9 mEq/L — ABNORMAL LOW (ref 3.5–5.1)
Sodium: 136 mEq/L (ref 135–145)

## 2013-05-27 LAB — CBC WITH DIFFERENTIAL/PLATELET
Basophils Absolute: 0 10*3/uL (ref 0.0–0.1)
Lymphocytes Relative: 24 % (ref 12–46)
Lymphs Abs: 2.4 10*3/uL (ref 0.7–4.0)
MCV: 102.5 fL — ABNORMAL HIGH (ref 78.0–100.0)
Neutro Abs: 6.3 10*3/uL (ref 1.7–7.7)
Neutrophils Relative %: 63 % (ref 43–77)
Platelets: 283 10*3/uL (ref 150–400)
RBC: 2.79 MIL/uL — ABNORMAL LOW (ref 3.87–5.11)
WBC: 9.9 10*3/uL (ref 4.0–10.5)

## 2013-05-27 LAB — TROPONIN I: Troponin I: <0.3 ng/mL (ref <0.30)

## 2013-05-27 MED ORDER — HYDROMORPHONE HCL PF 1 MG/ML IJ SOLN
1.0000 mg | Freq: Once | INTRAMUSCULAR | Status: AC
  Administered 2013-05-27: 1 mg via INTRAVENOUS
  Filled 2013-05-27: qty 1

## 2013-05-27 MED ORDER — METHOCARBAMOL 500 MG PO TABS: 500.0000 mg | ORAL_TABLET | Freq: Four times a day (QID) | ORAL | Status: DC | PRN

## 2013-05-27 MED ORDER — ONDANSETRON HCL 4 MG/2ML IJ SOLN
4.0000 mg | Freq: Once | INTRAMUSCULAR | Status: AC
  Administered 2013-05-27: 4 mg via INTRAVENOUS
  Filled 2013-05-27: qty 2

## 2013-05-27 NOTE — ED Provider Notes (Signed)
CSN: [id number]     Arrival date & time 05/27/13  1917 History   First MD Initiated Contact with Patient 05/27/13 2007     Chief Complaint  Patient presents with  . Hip Pain   (Consider location/radiation/quality/duration/timing/severity/associated sxs/prior Treatment) The history is provided by the patient and medical records. No language interpreter was used.   Jaime Phillips is a 52 year old female status post right total hip arthroplasty performed 04/10/2013 by Dr. Gatha Mayer who presents emergency department chief complaint of right hip pain.  Patient states that 2 nights ago she had a mechanical fall onto her right hip.  Her husband was able to help her from the floor.  She states that she had a lot of pain in her hip but was able to ambulate with her [place] and applying minimal to touch pressure to the ground.  However last night the patient had another fall.  She got tripped up on her left leg was unable to hold to protect herself with her right leg to the pain directly onto the right hip.  She complains of severe pain.  She is unable to him to the umbilicus all.  She's been in bed all day using home medicines to treat her pain.  She got into the hospital today for continuing severe pain.  Triaged x-ray shows oblique fracture of the femur at the level of the tip of the femoral stem on the right.  Past Medical History  Diagnosis Date  . Abnormal uterine bleeding   . Depression   . Arthritis   . Rectal bleeding     minor    Past Surgical History  Procedure Laterality Date  . Appendectomy    . Oophorectomy    . Shoulder surgery      left shoulder  . Colonoscopy    . Total hip arthroplasty Right 04/10/2013    Procedure: RIGHT TOTAL HIP ARTHROPLASTY ANTERIOR APPROACH;  Surgeon: Shelda Pal, MD;  Location: [facility];  Service: Orthopedics;  Laterality: Right;   Family History  Problem Relation Age of Onset  . Diabetes Mother   . Kidney disease Mother   . Heart disease Father    . Colon cancer Father     "father may have had colon cancer"  . Cancer Sister     breast   History  Substance Use Topics  . Smoking status: Current Every Day Smoker -- 0.50 packs/day for 25 years    Types: Cigarettes  . Smokeless tobacco: Never Used     Comment: Tobacco info given to pt. 08/16/12  . Alcohol Use: 1.8 oz/week    3 Shots of liquor per week   OB History   Grav Para Term Preterm Abortions TAB SAB Ect Mult Living   2 1 1  1  1   1      Review of Systems Ten systems reviewed and are negative for acute change, except as noted in the HPI.   Allergies  Codeine and Morphine and related  Home Medications   Current Outpatient Rx  Name  Route  Sig  Dispense  Refill  . acetaminophen (TYLENOL) 500 MG tablet   Oral   Take 500 mg by mouth every 6 (six) hours as needed (pain). For pain.         . Coenzyme Q10 (CO Q 10 PO)   Oral   Take by mouth daily.         . ferrous sulfate 325 (65 FE) MG tablet  Oral   Take 1 tablet (325 mg total) by mouth 3 (three) times daily after meals.      3   . methocarbamol (ROBAXIN) 500 MG tablet   Oral   Take 1 tablet (500 mg total) by mouth every 6 (six) hours as needed (muscle spasms).   50 tablet   0   . Multiple Vitamin (MULTIVITAMIN WITH MINERALS) TABS tablet   Oral   Take 1 tablet by mouth daily.         Marland Kitchen oxyCODONE (OXY IR/ROXICODONE) 5 MG immediate release tablet   Oral   Take 1-2 tablets (5-10 mg total) by mouth every 4 (four) hours as needed for pain.   60 tablet   0   . ranitidine (ZANTAC) 150 MG tablet   Oral   Take 1 tablet (150 mg total) by mouth 2 (two) times daily.         . vitamin E 400 UNIT capsule   Oral   Take 400 Units by mouth daily.          BP 120/75  Pulse 118  Temp(Src) 99.9 F (37.7 C) (Oral)  Resp 16  Ht 5\' 3"  (1.6 m)  Wt 135 lb (61.236 kg)  BMI 23.92 kg/m2  SpO2 100% Physical Exam Physical Exam  Nursing note and vitals reviewed. Constitutional: She is oriented to  person, place, and time. She appears well-developed and well-nourished. No distress.  HENT:  Head: Normocephalic and atraumatic.  Eyes: Conjunctivae normal and EOM are normal. Pupils are equal, round, and reactive to light. No scleral icterus.  Neck: Normal range of motion.  Cardiovascular: Normal rate, regular rhythm and normal heart sounds.  Exam reveals no gallop and no friction rub.   No murmur heard. Pulmonary/Chest: Effort normal and breath sounds normal. No respiratory distress.  Abdominal: Soft. Bowel sounds are normal. She exhibits no distension and no mass. There is no tenderness. There is no guarding.  Neurological: She is alert and oriented to person, place, and time.  Musculoskeletal: Right hip exam is performed.    Leg is lying with right foot everted to the side.  Patient has a large area of erythema on the medial thigh where she had heating pad today.  She has swelling in the knee.  The thigh is tight and swelling.  Distal pulses and sensation are intact. Skin: Skin is warm and dry. She is not diaphoretic.    ED Course  Procedures (including critical care time) Labs Review Labs Reviewed - No data to display Imaging Review Dg Hip Complete Right  05/27/2013   CLINICAL DATA:  Larey Seat  EXAM: RIGHT HIP - COMPLETE 2+ VIEW  COMPARISON:  04/10/2013  FINDINGS: Femoral and acetabular components of hip arthroplasty project in expected location. There is an oblique fracture of the femur at the level of the tip of the femoral stem, with several cm of overriding of fracture fragments and lateral angulation of the distal shaft fragment.  IMPRESSION: 1. Negative hip. 2. Femur fracture at the level of the tip of the femoral stem, with angulation and foreshortening. .   Electronically Signed   By: Oley Balm M.D.   On: 05/27/2013 20:34    EKG Interpretation   None       MDM   1. Hip fracture, right, closed, initial encounter    Patient is here with periprosthetic hip fracture. I  have spoken with Dr. Thomasena Edis who asks for hospitalist admission. That paittient's labs are pending. Dr. Charlann Boxer will  round on the patient in the morning. Patient will be admitted to the hospital for hip fracture. The patient appears reasonably stabilized for admission considering the current resources, flow, and capabilities available in the ED at this time, and I doubt any other [facility] requiring further screening and/or treatment in the ED prior to admission.     Arthor Captain, PA-C 05/28/13 1701

## 2013-05-27 NOTE — ED Notes (Signed)
Pt states fell Saturday injuring right hip. States pain has increased since fall. Pt has ambulated with [place].

## 2013-05-27 NOTE — H&P (Addendum)
[facility] History and Physical  Jaime Phillips ZOX:[id number] DOB: 11-30-1960 DOA: 05/27/2013  Referring physician: ER physician. PCP: No primary provider on file.   Chief Complaint: Right hip pain.  HPI: Jaime Phillips is a 52 y.o. female with history of osteoarthritis who has had right hip hemiarthroplasty last month presents to the ER because of pain after fall. Patient fell yesterday and since then has been having pain in the right hip. Patient denies having hit her head or lost consciousness. Patient denies any chest pain shortness of breath palpitations. Dr. Ferd Phillips, patient's orthopedic surgeon has been consulted by the ER physician and patient is admitted for further management.   Review of Systems: As presented in the history of presenting illness, rest negative.  Past Medical History  Diagnosis Date  . Abnormal uterine bleeding   . Depression   . Arthritis   . Rectal bleeding     minor    Past Surgical History  Procedure Laterality Date  . Appendectomy    . Oophorectomy    . Shoulder surgery      left shoulder  . Colonoscopy    . Total hip arthroplasty Right 04/10/2013    Procedure: RIGHT TOTAL HIP ARTHROPLASTY ANTERIOR APPROACH;  Surgeon: Jaime Pal, MD;  Location: [facility];  Service: Orthopedics;  Laterality: Right;   Social History:  reports that she has been smoking Cigarettes.  She has a 12.5 pack-year smoking history. She has never used smokeless tobacco. She reports that she drinks about 1.8 ounces of alcohol per week. She reports that she does not use illicit drugs. Where does patient live home. Can patient participate in ADLs? Yes.  Allergies  Allergen Reactions  . Codeine Other (See Comments)    hallucinating  . Morphine And Related     Made head feel funny    Family History:  Family History  Problem Relation Age of Onset  . Diabetes Mother   . Kidney disease Mother   . Heart disease Father   . Colon cancer Father     "father may have  had colon cancer"  . Cancer Sister     breast      Prior to Admission medications   Medication Sig Start Date End Date Taking? Authorizing Provider  acetaminophen (TYLENOL) 500 MG tablet Take 500 mg by mouth every 6 (six) hours as needed (pain). For pain.   Yes Historical Provider, MD  Coenzyme Q10 (CO Q 10 PO) Take by mouth daily.   Yes Historical Provider, MD  ferrous sulfate 325 (65 FE) MG tablet Take 1 tablet (325 mg total) by mouth 3 (three) times daily after meals. 04/12/13  Yes Jaime Gather Babish, PA-C  methocarbamol (ROBAXIN) 500 MG tablet Take 1 tablet (500 mg total) by mouth every 6 (six) hours as needed (muscle spasms). 04/12/13  Yes Jaime Gather Babish, PA-C  Multiple Vitamin (MULTIVITAMIN WITH MINERALS) TABS tablet Take 1 tablet by mouth daily.   Yes Historical Provider, MD  oxyCODONE (OXY IR/ROXICODONE) 5 MG immediate release tablet Take 1-2 tablets (5-10 mg total) by mouth every 4 (four) hours as needed for pain. 04/12/13  Yes Jaime Gather Babish, PA-C  ranitidine (ZANTAC) 150 MG tablet Take 1 tablet (150 mg total) by mouth 2 (two) times daily. 04/12/13  Yes Jaime Gather Babish, PA-C  vitamin E 400 UNIT capsule Take 400 Units by mouth daily.   Yes Historical Provider, MD    Physical Exam: Filed Vitals:   05/27/13 1934  BP: 120/75  Pulse:  118  Temp: 99.9 F (37.7 C)  TempSrc: Oral  Resp: 16  Height: 5\' 3"  (1.6 m)  Weight: 61.236 kg (135 lb)  SpO2: 100%     General:  Well-developed well-nourished.  Eyes: Anicteric no pallor.  ENT: No discharge from ears eyes nose mouth.  Neck: No mass felt.  Cardiovascular: S1-S2 heard.  Respiratory: No rhonchi or crepitations.  Abdomen: Soft nontender bowel sounds present.  Skin: No rash.  Musculoskeletal: Pain on moving right hip.  Psychiatric: Appears normal.  Neurologic: Alert awake oriented to time place and person. Moves all extremities.  Labs on Admission:  Basic Metabolic Panel: No results found for this  basename: NA, K, CL, CO2, GLUCOSE, BUN, CREATININE, CALCIUM, MG, PHOS,  in the last 168 hours Liver Function Tests: No results found for this basename: AST, ALT, ALKPHOS, BILITOT, PROT, ALBUMIN,  in the last 168 hours No results found for this basename: LIPASE, AMYLASE,  in the last 168 hours No results found for this basename: AMMONIA,  in the last 168 hours CBC:  Recent Labs Lab 05/27/13 2120  WBC 9.9  NEUTROABS 6.3  HGB 9.6*  HCT 28.6*  MCV 102.5*  PLT 283   Cardiac Enzymes: No results found for this basename: CKTOTAL, CKMB, CKMBINDEX, TROPONINI,  in the last 168 hours  BNP (last 3 results) No results found for this basename: PROBNP,  in the last 8760 hours CBG: No results found for this basename: GLUCAP,  in the last 168 hours  Radiological Exams on Admission: Dg Hip Complete Right  05/27/2013   CLINICAL DATA:  Larey Seat  EXAM: RIGHT HIP - COMPLETE 2+ VIEW  COMPARISON:  04/10/2013  FINDINGS: Femoral and acetabular components of hip arthroplasty project in expected location. There is an oblique fracture of the femur at the level of the tip of the femoral stem, with several cm of overriding of fracture fragments and lateral angulation of the distal shaft fragment.  IMPRESSION: 1. Negative hip. 2. Femur fracture at the level of the tip of the femoral stem, with angulation and foreshortening. .   Electronically Signed   By: Oley Balm M.D.   On: 05/27/2013 20:34     Assessment/Plan Principal Problem:   Hip fracture   1. Right hip fracture - patient has been placed Phillipsp.o. past midnight with pain relief medications. From medical standpoint after patient is stable for surgery is considered by orthopedics. Further recommendations per orthopedic surgery. 2. Macrocytic anemia - partially due to recent blood loss from anemia. Since patient has macrocytosis we will check anemia panel. 3. Tobacco abuse - strongly advised to quit smoking.  Most of patient's labs are still  pending.   Code Status: Full code.  Family Communication: None.  Disposition Plan: Admit to inpatient.    Jaime Jaye N. [facility] Pager [phone].  If 7PM-7AM, please contact night-coverage [email or web address] Password [email or web address] 05/27/2013, 10:11 PM

## 2013-05-28 ENCOUNTER — Inpatient Hospital Stay (HOSPITAL_COMMUNITY): Payer: Self-pay

## 2013-05-28 ENCOUNTER — Encounter (HOSPITAL_COMMUNITY): Payer: Self-pay | Admitting: Anesthesiology

## 2013-05-28 ENCOUNTER — Inpatient Hospital Stay (HOSPITAL_COMMUNITY): Payer: No Typology Code available for payment source | Admitting: Anesthesiology

## 2013-05-28 ENCOUNTER — Inpatient Hospital Stay (HOSPITAL_COMMUNITY): Payer: No Typology Code available for payment source

## 2013-05-28 ENCOUNTER — Encounter (HOSPITAL_COMMUNITY)
Admission: EM | Disposition: A | Discharge status: Rehabilitation Facility | Payer: Self-pay | Source: Home / Self Care | Attending: Internal Medicine

## 2013-05-28 DIAGNOSIS — D500 Iron deficiency anemia secondary to blood loss (chronic): Secondary | ICD-10-CM

## 2013-05-28 DIAGNOSIS — Z96649 Presence of unspecified artificial hip joint: Secondary | ICD-10-CM

## 2013-05-28 HISTORY — PX: ORIF PERIPROSTHETIC FRACTURE: SHX5034

## 2013-05-28 LAB — CBC
HCT: 25.5 % — ABNORMAL LOW (ref 36.0–46.0)
MCH: 34.1 pg — ABNORMAL HIGH (ref 26.0–34.0)
MCHC: 33.3 g/dL (ref 30.0–36.0)
MCV: 102.4 fL — ABNORMAL HIGH (ref 78.0–100.0)
RBC: 2.49 MIL/uL — ABNORMAL LOW (ref 3.87–5.11)
RDW: 13.7 % (ref 11.5–15.5)

## 2013-05-28 LAB — RETICULOCYTES: Retic Ct Pct: 4 % — ABNORMAL HIGH (ref 0.4–3.1)

## 2013-05-28 LAB — BASIC METABOLIC PANEL
BUN: 8 mg/dL (ref 6–23)
Creatinine, Ser: 0.51 mg/dL (ref 0.50–1.10)
GFR calc Af Amer: >90 mL/min (ref >90)
GFR calc non Af Amer: >90 mL/min (ref >90)
Glucose, Bld: 101 mg/dL — ABNORMAL HIGH (ref 70–99)
Potassium: 2.9 mEq/L — ABNORMAL LOW (ref 3.5–5.1)

## 2013-05-28 LAB — IRON AND TIBC
Iron: 49 ug/dL (ref 42–135)
Saturation Ratios: 16 % — ABNORMAL LOW (ref 20–55)
TIBC: 307 ug/dL (ref 250–470)
UIBC: 258 ug/dL (ref 125–400)

## 2013-05-28 LAB — URINE MICROSCOPIC-ADD ON

## 2013-05-28 LAB — URINALYSIS, ROUTINE W REFLEX MICROSCOPIC
Glucose, UA: NEGATIVE mg/dL
Hgb urine dipstick: NEGATIVE
Ketones, ur: NEGATIVE mg/dL
Protein, ur: 30 mg/dL — AB
pH: 6 (ref 5.0–8.0)

## 2013-05-28 LAB — FOLATE: Folate: 12.7 ng/mL

## 2013-05-28 LAB — SURGICAL PCR SCREEN: MRSA, PCR: NEGATIVE

## 2013-05-28 SURGERY — OPEN REDUCTION INTERNAL FIXATION (ORIF) PERIPROSTHETIC FRACTURE
Anesthesia: General | Site: Hip | Laterality: Right | Wound class: Clean

## 2013-05-28 MED ORDER — HYDROMORPHONE HCL PF 1 MG/ML IJ SOLN
INTRAMUSCULAR | Status: AC
  Filled 2013-05-28: qty 1

## 2013-05-28 MED ORDER — MORPHINE SULFATE 2 MG/ML IJ SOLN: 0.5000 mg | INTRAMUSCULAR | Status: DC | PRN

## 2013-05-28 MED ORDER — ADULT MULTIVITAMIN W/MINERALS CH
1.0000 | ORAL_TABLET | Freq: Every day | ORAL | Status: DC
  Administered 2013-05-29 – 2013-05-31 (×3): 1 via ORAL
  Filled 2013-05-28 (×3): qty 1

## 2013-05-28 MED ORDER — NEOSTIGMINE METHYLSULFATE 1 MG/ML IJ SOLN
INTRAMUSCULAR | Status: DC | PRN
  Administered 2013-05-28: 5 mg via INTRAVENOUS

## 2013-05-28 MED ORDER — SUCCINYLCHOLINE CHLORIDE 20 MG/ML IJ SOLN
INTRAMUSCULAR | Status: DC | PRN
  Administered 2013-05-28: 100 mg via INTRAVENOUS

## 2013-05-28 MED ORDER — ACETAMINOPHEN 325 MG PO TABS
650.0000 mg | ORAL_TABLET | Freq: Four times a day (QID) | ORAL | Status: DC | PRN
  Filled 2013-05-28: qty 2

## 2013-05-28 MED ORDER — METOCLOPRAMIDE HCL 5 MG/ML IJ SOLN
INTRAMUSCULAR | Status: DC | PRN
  Administered 2013-05-28: 10 mg via INTRAVENOUS

## 2013-05-28 MED ORDER — METHOCARBAMOL 100 MG/ML IJ SOLN
500.0000 mg | Freq: Four times a day (QID) | INTRAVENOUS | Status: DC | PRN
  Filled 2013-05-28: qty 5

## 2013-05-28 MED ORDER — METOCLOPRAMIDE HCL 5 MG/ML IJ SOLN: 5.0000 mg | Freq: Three times a day (TID) | INTRAMUSCULAR | Status: DC | PRN

## 2013-05-28 MED ORDER — DEXAMETHASONE SODIUM PHOSPHATE 4 MG/ML IJ SOLN
INTRAMUSCULAR | Status: DC | PRN
  Administered 2013-05-28: 10 mg via INTRAVENOUS

## 2013-05-28 MED ORDER — ENSURE COMPLETE PO LIQD: 237.0000 mL | Freq: Two times a day (BID) | ORAL | Status: DC | PRN

## 2013-05-28 MED ORDER — PROPOFOL 10 MG/ML IV BOLUS
INTRAVENOUS | Status: DC | PRN
  Administered 2013-05-28: 150 mg via INTRAVENOUS

## 2013-05-28 MED ORDER — LACTATED RINGERS IV SOLN
INTRAVENOUS | Status: DC | PRN
  Administered 2013-05-28 (×2): via INTRAVENOUS

## 2013-05-28 MED ORDER — METHOCARBAMOL 500 MG PO TABS
500.0000 mg | ORAL_TABLET | Freq: Four times a day (QID) | ORAL | Status: DC | PRN
  Administered 2013-05-28: 23:00:00 500 mg via ORAL
  Filled 2013-05-28: qty 1

## 2013-05-28 MED ORDER — ACETAMINOPHEN 650 MG RE SUPP: 650.0000 mg | Freq: Four times a day (QID) | RECTAL | Status: DC | PRN

## 2013-05-28 MED ORDER — ONDANSETRON HCL 4 MG/2ML IJ SOLN
INTRAMUSCULAR | Status: DC | PRN
  Administered 2013-05-28: 4 mg via INTRAVENOUS

## 2013-05-28 MED ORDER — GLYCOPYRROLATE 0.2 MG/ML IJ SOLN
INTRAMUSCULAR | Status: DC | PRN
  Administered 2013-05-28: 0.6 mg via INTRAVENOUS

## 2013-05-28 MED ORDER — FERROUS SULFATE 325 (65 FE) MG PO TABS
325.0000 mg | ORAL_TABLET | Freq: Three times a day (TID) | ORAL | Status: DC
  Administered 2013-05-28: 08:00:00 325 mg via ORAL
  Filled 2013-05-28 (×4): qty 1

## 2013-05-28 MED ORDER — FENTANYL CITRATE 0.05 MG/ML IJ SOLN
INTRAMUSCULAR | Status: DC | PRN
  Administered 2013-05-28 (×5): 50 ug via INTRAVENOUS

## 2013-05-28 MED ORDER — ROCURONIUM BROMIDE 100 MG/10ML IV SOLN
INTRAVENOUS | Status: DC | PRN
  Administered 2013-05-28: 30 mg via INTRAVENOUS

## 2013-05-28 MED ORDER — PHENYLEPHRINE HCL 10 MG/ML IJ SOLN
INTRAMUSCULAR | Status: DC | PRN
  Administered 2013-05-28 (×2): 80 ug via INTRAVENOUS

## 2013-05-28 MED ORDER — HYDROMORPHONE HCL PF 1 MG/ML IJ SOLN
INTRAMUSCULAR | Status: DC | PRN
  Administered 2013-05-28: 2 mg via INTRAVENOUS

## 2013-05-28 MED ORDER — HYDROCODONE-ACETAMINOPHEN 5-325 MG PO TABS: 1.0000 | ORAL_TABLET | Freq: Four times a day (QID) | ORAL | Status: DC | PRN

## 2013-05-28 MED ORDER — MIDAZOLAM HCL 5 MG/5ML IJ SOLN
INTRAMUSCULAR | Status: DC | PRN
  Administered 2013-05-28: 2 mg via INTRAVENOUS

## 2013-05-28 MED ORDER — HYDROCODONE-ACETAMINOPHEN 5-325 MG PO TABS: 1.0000 | ORAL_TABLET | ORAL | Status: DC | PRN

## 2013-05-28 MED ORDER — POLYETHYLENE GLYCOL 3350 17 G PO PACK: 17.0000 g | PACK | Freq: Every day | ORAL | Status: DC | PRN

## 2013-05-28 MED ORDER — ASPIRIN EC 325 MG PO TBEC
325.0000 mg | DELAYED_RELEASE_TABLET | Freq: Two times a day (BID) | ORAL | Status: DC
  Administered 2013-05-29: 325 mg via ORAL
  Filled 2013-05-28 (×3): qty 1

## 2013-05-28 MED ORDER — POTASSIUM CHLORIDE 10 MEQ/100ML IV SOLN
10.0000 meq | INTRAVENOUS | Status: AC
  Administered 2013-05-28 (×2): 10 meq via INTRAVENOUS
  Filled 2013-05-28 (×2): qty 100

## 2013-05-28 MED ORDER — LABETALOL HCL 5 MG/ML IV SOLN
5.0000 mg | Freq: Once | INTRAVENOUS | Status: AC
  Administered 2013-05-28: 5 mg via INTRAVENOUS

## 2013-05-28 MED ORDER — DOCUSATE SODIUM 100 MG PO CAPS
100.0000 mg | ORAL_CAPSULE | Freq: Two times a day (BID) | ORAL | Status: DC
  Administered 2013-05-28: 100 mg via ORAL

## 2013-05-28 MED ORDER — PROMETHAZINE HCL 25 MG/ML IJ SOLN: 6.2500 mg | INTRAMUSCULAR | Status: DC | PRN

## 2013-05-28 MED ORDER — ONDANSETRON HCL 4 MG PO TABS: 4.0000 mg | ORAL_TABLET | Freq: Four times a day (QID) | ORAL | Status: DC | PRN

## 2013-05-28 MED ORDER — FAMOTIDINE IN NACL 20-0.9 MG/50ML-% IV SOLN
20.0000 mg | INTRAVENOUS | Status: AC
  Administered 2013-05-28: 20 mg via INTRAVENOUS
  Filled 2013-05-28: qty 50

## 2013-05-28 MED ORDER — KETAMINE HCL 10 MG/ML IJ SOLN
INTRAMUSCULAR | Status: DC | PRN
  Administered 2013-05-28: 50 mg via INTRAVENOUS

## 2013-05-28 MED ORDER — FERROUS SULFATE 325 (65 FE) MG PO TABS
325.0000 mg | ORAL_TABLET | Freq: Three times a day (TID) | ORAL | Status: DC
  Administered 2013-05-29 – 2013-05-31 (×7): 325 mg via ORAL
  Filled 2013-05-28 (×10): qty 1

## 2013-05-28 MED ORDER — LACTATED RINGERS IV SOLN: INTRAVENOUS | Status: AC

## 2013-05-28 MED ORDER — HYDROMORPHONE HCL PF 1 MG/ML IJ SOLN
0.2500 mg | INTRAMUSCULAR | Status: DC | PRN
  Administered 2013-05-28 (×3): 0.5 mg via INTRAVENOUS

## 2013-05-28 MED ORDER — METOCLOPRAMIDE HCL 10 MG PO TABS: 5.0000 mg | ORAL_TABLET | Freq: Three times a day (TID) | ORAL | Status: DC | PRN

## 2013-05-28 MED ORDER — PHENOL 1.4 % MT LIQD
1.0000 | OROMUCOSAL | Status: DC | PRN
  Filled 2013-05-28: qty 177

## 2013-05-28 MED ORDER — HYDROMORPHONE HCL PF 1 MG/ML IJ SOLN
0.5000 mg | INTRAMUSCULAR | Status: DC | PRN
  Administered 2013-05-28: 0.5 mg via INTRAVENOUS
  Filled 2013-05-28: qty 1

## 2013-05-28 MED ORDER — BISACODYL 10 MG RE SUPP
10.0000 mg | Freq: Every day | RECTAL | Status: DC | PRN
  Administered 2013-05-29 – 2013-05-31 (×2): 10 mg via RECTAL
  Filled 2013-05-28 (×2): qty 1

## 2013-05-28 MED ORDER — SODIUM CHLORIDE 0.9 % IV SOLN
INTRAVENOUS | Status: DC
  Administered 2013-05-28: 01:00:00 via INTRAVENOUS

## 2013-05-28 MED ORDER — OXYCODONE-ACETAMINOPHEN 5-325 MG PO TABS
1.0000 | ORAL_TABLET | ORAL | Status: DC | PRN
  Administered 2013-05-29 (×5): 1 via ORAL
  Filled 2013-05-28 (×5): qty 1

## 2013-05-28 MED ORDER — MENTHOL 3 MG MT LOZG
1.0000 | LOZENGE | OROMUCOSAL | Status: DC | PRN
  Filled 2013-05-28: qty 9

## 2013-05-28 MED ORDER — POTASSIUM CHLORIDE 10 MEQ/100ML IV SOLN
10.0000 meq | INTRAVENOUS | Status: AC
  Administered 2013-05-28 (×4): 10 meq via INTRAVENOUS
  Filled 2013-05-28 (×4): qty 100

## 2013-05-28 MED ORDER — ONDANSETRON HCL 4 MG/2ML IJ SOLN: 4.0000 mg | Freq: Four times a day (QID) | INTRAMUSCULAR | Status: DC | PRN

## 2013-05-28 MED ORDER — CEFAZOLIN SODIUM-DEXTROSE 2-3 GM-% IV SOLR
INTRAVENOUS | Status: AC
  Filled 2013-05-28: qty 50

## 2013-05-28 MED ORDER — FLEET ENEMA 7-19 GM/118ML RE ENEM: 1.0000 | ENEMA | Freq: Once | RECTAL | Status: AC | PRN

## 2013-05-28 MED ORDER — HYDROMORPHONE HCL PF 1 MG/ML IJ SOLN
0.5000 mg | INTRAMUSCULAR | Status: DC | PRN
  Administered 2013-05-29: 1 mg via INTRAVENOUS
  Filled 2013-05-28: qty 1

## 2013-05-28 MED ORDER — LABETALOL HCL 5 MG/ML IV SOLN
INTRAVENOUS | Status: AC
  Filled 2013-05-28: qty 4

## 2013-05-28 MED ORDER — HYDROMORPHONE HCL PF 1 MG/ML IJ SOLN
0.5000 mg | INTRAMUSCULAR | Status: DC | PRN
  Administered 2013-05-28 (×2): 1 mg via INTRAVENOUS
  Filled 2013-05-28 (×2): qty 1

## 2013-05-28 MED ORDER — ONDANSETRON HCL 4 MG/2ML IJ SOLN
4.0000 mg | Freq: Once | INTRAMUSCULAR | Status: AC
  Administered 2013-05-28: 4 mg via INTRAVENOUS

## 2013-05-28 MED ORDER — SODIUM CHLORIDE 0.9 % IV SOLN
INTRAVENOUS | Status: DC
  Administered 2013-05-28 (×2): via INTRAVENOUS
  Filled 2013-05-28 (×8): qty 1000

## 2013-05-28 MED ORDER — CEFAZOLIN SODIUM-DEXTROSE 2-3 GM-% IV SOLR
2.0000 g | Freq: Four times a day (QID) | INTRAVENOUS | Status: AC
  Administered 2013-05-28 – 2013-05-29 (×2): 2 g via INTRAVENOUS
  Filled 2013-05-28 (×2): qty 50

## 2013-05-28 MED ORDER — BUPIVACAINE LIPOSOME 1.3 % IJ SUSP
20.0000 mL | Freq: Once | INTRAMUSCULAR | Status: DC
  Filled 2013-05-28: qty 20

## 2013-05-28 SURGICAL SUPPLY — 55 items
BAG ZIPLOCK 12X15 (MISCELLANEOUS) ×2 IMPLANT
BIT DRILL Q/COUPLING 1 (BIT) ×2 IMPLANT
CABLE FOR BONE PLATE (Cable) ×8 IMPLANT
CLOTH BEACON ORANGE TIMEOUT ST (SAFETY) ×2 IMPLANT
DRAPE INCISE IOBAN 66X45 STRL (DRAPES) ×2 IMPLANT
DRAPE ORTHO SPLIT 77X108 STRL (DRAPES) ×2
DRAPE POUCH INSTRU U-SHP 10X18 (DRAPES) ×2 IMPLANT
DRAPE SURG 17X11 SM STRL (DRAPES) ×2 IMPLANT
DRAPE SURG ORHT 6 SPLT 77X108 (DRAPES) ×2 IMPLANT
DRAPE U-SHAPE 47X51 STRL (DRAPES) ×2 IMPLANT
DRSG EMULSION OIL 3X16 NADH (GAUZE/BANDAGES/DRESSINGS) ×2 IMPLANT
DRSG MEPILEX BORDER 4X12 (GAUZE/BANDAGES/DRESSINGS) ×2 IMPLANT
DRSG PAD ABDOMINAL 8X10 ST (GAUZE/BANDAGES/DRESSINGS) ×4 IMPLANT
DURAPREP 26ML APPLICATOR (WOUND CARE) ×2 IMPLANT
ELECT BLADE TIP CTD 4 INCH (ELECTRODE) ×2 IMPLANT
ELECT REM PT RETURN 9FT ADLT (ELECTROSURGICAL) ×2
ELECTRODE REM PT RTRN 9FT ADLT (ELECTROSURGICAL) ×1 IMPLANT
EVACUATOR 1/8 PVC DRAIN (DRAIN) ×2 IMPLANT
FACESHIELD LNG OPTICON STERILE (SAFETY) ×8 IMPLANT
GLOVE BIOGEL PI IND STRL 7.5 (GLOVE) ×1 IMPLANT
GLOVE BIOGEL PI IND STRL 8 (GLOVE) ×1 IMPLANT
GLOVE BIOGEL PI INDICATOR 7.5 (GLOVE) ×1
GLOVE BIOGEL PI INDICATOR 8 (GLOVE) ×1
GLOVE ECLIPSE 8.0 STRL XLNG CF (GLOVE) IMPLANT
GLOVE ORTHO TXT STRL SZ7.5 (GLOVE) ×2 IMPLANT
GLOVE SURG ORTHO 8.0 STRL STRW (GLOVE) ×2 IMPLANT
GOWN BRE IMP PREV XXLGXLNG (GOWN DISPOSABLE) ×4 IMPLANT
GOWN PREVENTION PLUS LG XLONG (DISPOSABLE) ×2 IMPLANT
IMMOBILIZER KNEE 20 (SOFTGOODS)
IMMOBILIZER KNEE 20 THIGH 36 (SOFTGOODS) IMPLANT
KIT BASIN OR (CUSTOM PROCEDURE TRAY) ×2 IMPLANT
MANIFOLD NEPTUNE II (INSTRUMENTS) ×2 IMPLANT
NS IRRIG 1000ML POUR BTL (IV SOLUTION) ×4 IMPLANT
PACK TOTAL JOINT (CUSTOM PROCEDURE TRAY) ×2 IMPLANT
PASSER SUT SWANSON 36MM LOOP (INSTRUMENTS) IMPLANT
PENCIL BUTTON HOLSTER BLD 10FT (ELECTRODE) ×2 IMPLANT
PLATE CABLE BONE 6HOLE (Plate) ×2 IMPLANT
POSITIONER SURGICAL ARM (MISCELLANEOUS) ×2 IMPLANT
SCREW CORTEX ST 4.5X32 (Screw) ×4 IMPLANT
SCREW CORTEX ST 4.5X34 (Screw) ×2 IMPLANT
SPONGE GAUZE 4X4 12PLY (GAUZE/BANDAGES/DRESSINGS) ×4 IMPLANT
SPONGE LAP 18X18 X RAY DECT (DISPOSABLE) ×2 IMPLANT
SPONGE LAP 4X18 X RAY DECT (DISPOSABLE) ×2 IMPLANT
STAPLER SKIN PROX WIDE 3.9 (STAPLE) ×4 IMPLANT
STAPLER VISISTAT 35W (STAPLE) ×4 IMPLANT
STRIP CLOSURE SKIN 1/2X4 (GAUZE/BANDAGES/DRESSINGS) IMPLANT
SUCTION FRAZIER TIP 10 FR DISP (SUCTIONS) ×2 IMPLANT
SUT ETHIBOND NAB CT1 #1 30IN (SUTURE) IMPLANT
SUT MNCRL AB 4-0 PS2 18 (SUTURE) IMPLANT
SUT VIC AB 1 CT1 36 (SUTURE) ×6 IMPLANT
SUT VIC AB 2-0 CT1 27 (SUTURE) ×3
SUT VIC AB 2-0 CT1 TAPERPNT 27 (SUTURE) ×3 IMPLANT
TOWEL OR 17X26 10 PK STRL BLUE (TOWEL DISPOSABLE) ×4 IMPLANT
TRAY FOLEY CATH 14FRSI W/METER (CATHETERS) ×2 IMPLANT
WATER STERILE IRR 1500ML POUR (IV SOLUTION) ×2 IMPLANT

## 2013-05-28 NOTE — Progress Notes (Signed)
Clinical Social Work Department BRIEF PSYCHOSOCIAL ASSESSMENT 05/28/2013  Patient:  Jaime Phillips, Jaime Phillips     Account Number:  [id number]     Admit date:  05/27/2013  Clinical Social Worker:  Candie Chroman  Date/Time:  05/28/2013 04:02 PM  Referred by:  Physician  Date Referred:  05/28/2013 Referred for  SNF Placement   Other Referral:   Interview type:  Patient Other interview type:    PSYCHOSOCIAL DATA Living Status:  ALONE Admitted from facility:   Level of care:   Primary support name:  Mat Carne Primary support relationship to patient:   Degree of support available:   unclear    CURRENT CONCERNS Current Concerns  Post-Acute Placement   Other Concerns:    SOCIAL WORK ASSESSMENT / PLAN Pt is a 52 yr old female living at home prior to hospitalization. CSW met with pt to assist with d/c planning. Pt has a right hip fx and will have surgery today. ST SNF placement will be needed following hospital d/c. SNF search has been initiated and bed offers will be provided as received. Pt has no insurance. [place] will provide a Letter of Guarantee for ST SNF.   Assessment/plan status:  Psychosocial Support/Ongoing Assessment of Needs Other assessment/ plan:   Information/referral to community resources:   SNF list with bed offers to be provided.    PATIENT'S/FAMILY'S RESPONSE TO PLAN OF CARE: Pt would like to have rehab in [place] but understands this may not be possible.    Cori Razor LCSW [phone]

## 2013-05-28 NOTE — Brief Op Note (Signed)
05/27/2013 - 05/28/2013  8:38 PM  PATIENT:  Jaime Phillips  52 y.o. female  PRE-OPERATIVE DIAGNOSIS:  Right periprosthetic proximal femur fracture, type II (stable prosthesis)  POST-OPERATIVE DIAGNOSIS:  Right periprosthetic proximal femur fracture, type II (stable prosthesis)  PROCEDURE:  Procedure(s): OPEN REDUCTION INTERNAL FIXATION (ORIF) PERIPROSTHETIC FRACTURE (Right)  Zimmer cable ready plate  SURGEON:  Surgeon(s) and Role:    * Shelda Pal, MD - Primary  PHYSICIAN ASSISTANT: Shelly Coss  ANESTHESIA:   general  EBL:  Total I/O In: 2300 [I.V.:2300] Out: 1000 [Urine:650; Blood:350]  BLOOD ADMINISTERED:none  DRAINS: none   LOCAL MEDICATIONS USED:  NONE  SPECIMEN:  No Specimen  DISPOSITION OF SPECIMEN:  N/A  COUNTS:  YES  TOURNIQUET:  * No tourniquets in log *  DICTATION: .Other Dictation: Dictation Number [id number]  PLAN OF CARE: Admit to inpatient   PATIENT DISPOSITION:  PACU - hemodynamically stable.   Delay start of Pharmacological VTE agent (>24hrs) due to surgical blood loss or risk of bleeding: no

## 2013-05-28 NOTE — Anesthesia Preprocedure Evaluation (Addendum)
Anesthesia Evaluation  Patient identified by MRN, date of birth, ID band Patient awake  General Assessment Comment: Abnormal uterine bleeding     .  Depression     .  Arthritis     .  Rectal bleeding         minor      Reviewed: Allergy & Precautions, H&P , NPO status , Patient's Chart, lab work & pertinent test results  Airway Mallampati: II TM Distance: >3 FB Neck ROM: Full    Dental no notable dental hx.    Pulmonary Current Smoker,  CXR: reviewed. No active chest disease. breath sounds clear to auscultation  Pulmonary exam normal       Cardiovascular negative cardio ROS  Rhythm:Regular Rate:Normal  ECG: SR 93   Neuro/Psych PSYCHIATRIC DISORDERS Depression negative neurological ROS     GI/Hepatic negative GI ROS, Neg liver ROS,   Endo/Other  negative endocrine ROS  Renal/GU negative Renal ROS  negative genitourinary   Musculoskeletal negative musculoskeletal ROS (+)   Abdominal   Peds negative pediatric ROS (+)  Hematology negative hematology ROS (+)   Anesthesia Other Findings   Reproductive/Obstetrics negative OB ROS                         Anesthesia Physical Anesthesia Plan  ASA: II  Anesthesia Plan: General   Post-op Pain Management:    Induction: Intravenous  Airway Management Planned: Oral ETT  Additional Equipment:   Intra-op Plan:   Post-operative Plan: Extubation in OR  Informed Consent: I have reviewed the patients History and Physical, chart, labs and discussed the procedure including the risks, benefits and alternatives for the proposed anesthesia with the patient or authorized representative who has indicated his/her understanding and acceptance.   Dental advisory given  Plan Discussed with: CRNA  Anesthesia Plan Comments: (Had general 04-10-13 for THA. 8mm density on CXR discussed with patient. She had been told about this and understands this needs  follow-up.)       Anesthesia Quick Evaluation

## 2013-05-28 NOTE — Progress Notes (Signed)
[facility] MD on call paged and notified that no Ortho Tech available to apply Buck's Traction on patient and agreed to have it placed in the am. FYI.

## 2013-05-28 NOTE — Progress Notes (Signed)
INITIAL NUTRITION ASSESSMENT  DOCUMENTATION CODES Per approved criteria  -Not Applicable   INTERVENTION: Provide Ensure Complete BID PRN once diet advanced Provide Multivitamin with minerals daily Recommend providing additional calcium/vitamin D supplement Encourage PO intake with protein rich foods  NUTRITION DIAGNOSIS: Predicted suboptimal energy intake related to poor appetite, sore throat, and scheduled surgery as evidenced by pt's report.   Goal: Pt to meet >/= 90% of their estimated nutrition needs   Monitor:  Diet advancement/PO intake Weight Labs  Reason for Assessment: Consult (Hip Fracture Protocol)  52 y.o. female  Admitting Dx: Hip fracture  ASSESSMENT: 52 y.o. female with history of osteoarthritis who has had right hip hemiarthroplasty last month presents to the ER because of pain after fall. Patient fell yesterday and since then has been having pain in the right hip. Plan is to go to the OR today for ORIF of her right hip versus need to revise her femoral component.  Pt reports that she has not had anything to eat for the past 2 days due to having a sore throat yesterday and being NPO today. Pt states that she was told to lose 15 lbs before her first hip surgery, 04/10/13, because she weighed too much and her belly was too big. Pt states she usually weighs 145 lbs. Weight history shows pt lost down to 135 lbs. She reports having a poor appetite since her last surgery. Discussed healthy goal for weight/weight loss. Encouraged pt to eat 3 balanced meals daily to keep nutrition status and strength up; encouraged pt to eat a protein-rich food at each meal. Reviewed protein-rich foods. Pt reports that she does not like milk/dairy products. Pt may benefit from calcium and vitamin D supplements. Pt does not feel at this time that she will be able to eat well post surgery. Will add Ensure supplements PRN.   Height: Ht Readings from Last 1 Encounters:  05/27/13 5\' 3"  (1.6 m)     Weight: Wt Readings from Last 1 Encounters:  05/27/13 135 lb (61.236 kg)    Ideal Body Weight: 115 lbs  % Ideal Body Weight: 117%  Wt Readings from Last 10 Encounters:  05/27/13 135 lb (61.236 kg)  05/27/13 135 lb (61.236 kg)  04/10/13 139 lb (63.05 kg)  04/10/13 139 lb (63.05 kg)  04/06/13 139 lb (63.05 kg)  11/07/12 143 lb 3.2 oz (64.955 kg)  10/02/12 143 lb (64.864 kg)  08/21/12 143 lb (64.864 kg)  08/16/12 143 lb 6.4 oz (65.046 kg)    Usual Body Weight: 145 lbs  % Usual Body Weight: 93%  BMI:  Body mass index is 23.92 kg/(m^2).  Estimated Nutritional Needs: Kcal: 1600-1800 Protein: 70-80 grams Fluid: 1.8-2 L/day  Skin: non-pitting RLE edema; intact  Diet Order: NPO  EDUCATION NEEDS: -No education needs identified at this time   Intake/Output Summary (Last 24 hours) at 05/28/13 1513 Last data filed at 05/28/13 1210  Gross per 24 hour  Intake    200 ml  Output      2 ml  Net    198 ml    Last BM: 10/27  Labs:   Recent Labs Lab 05/27/13 2120 05/28/13 0500  NA 136 134*  K 2.9* 2.9*  CL 99 98  CO2 24 26  BUN 10 8  CREATININE 0.48* 0.51  CALCIUM 9.4 9.1  GLUCOSE 101* 101*    CBG (last 3)  No results found for this basename: GLUCAP,  in the last 72 hours  Scheduled Meds: . ferrous  sulfate  325 mg Oral TID PC    Continuous Infusions: . sodium chloride 0.9 % 1,000 mL with potassium chloride 40 mEq infusion 75 mL/hr at 05/28/13 1007    Past Medical History  Diagnosis Date  . Abnormal uterine bleeding   . Depression   . Arthritis   . Rectal bleeding     minor     Past Surgical History  Procedure Laterality Date  . Appendectomy    . Oophorectomy    . Shoulder surgery      left shoulder  . Colonoscopy    . Total hip arthroplasty Right 04/10/2013    Procedure: RIGHT TOTAL HIP ARTHROPLASTY ANTERIOR APPROACH;  Surgeon: Shelda Pal, MD;  Location: [facility];  Service: Orthopedics;  Laterality: Right;    Ian Malkin RD,  LDN Inpatient Clinical Dietitian Pager: [phone] After Hours Pager: [phone]

## 2013-05-28 NOTE — Progress Notes (Signed)
Radiology technician call with the result of portable chest X-ray " Indeterminate 8 mm density in the left lower chest. Findings could be associated with overlying shadows or ribs but cannot exclude a pulmonary nodule. This area can be more definitively evaluated with CT". Called Dr. Jomarie Longs and informed her of the above result.

## 2013-05-28 NOTE — Progress Notes (Signed)
[facility] PROGRESS NOTE  Jaime Phillips EAV:[id number] DOB: Nov 18, 1960 DOA: 05/27/2013 PCP: No primary provider on file.  Assessment/Plan: 1. Right hip fracture -to OR today per Dr.Olin -start DVT prophylaxis post op -pain control  2. Macrocytic anemia -FU anemia panel  3. ?possible  8mm LLL density vs Rib shadow  -check 2 view CXR -if consistent with nodule will need FU for this  4.  Tobacco abuse  -counseled  5. Hypokalemia -replace  DVT proph   Code Status: Full Family Communication: none at bedside Disposition Plan: will likely need SNF   Consultants:  Ortho dr.Olin  HPI/Subjective: Some pain at the fracture site, otherwise feels well  Objective: Filed Vitals:   05/28/13 1352  BP: 110/71  Pulse: 74  Temp: 98.9 F (37.2 C)  Resp: 16    Intake/Output Summary (Last 24 hours) at 05/28/13 1357 Last data filed at 05/28/13 1210  Gross per 24 hour  Intake    200 ml  Output      2 ml  Net    198 ml   Filed Weights   05/27/13 1934  Weight: 61.236 kg (135 lb)    Exam:   General: AAOx3, no distress  Cardiovascular: S1s2/RRR  Respiratory: CTAB  Abdomen: soft, NT, BS present  Musculoskeletal: RLE ext rotated, distal pulses intact  Data Reviewed: Basic Metabolic Panel:  Recent Labs Lab 05/27/13 2120 05/28/13 0500  NA 136 134*  K 2.9* 2.9*  CL 99 98  CO2 24 26  GLUCOSE 101* 101*  BUN 10 8  CREATININE 0.48* 0.51  CALCIUM 9.4 9.1   Liver Function Tests:  Recent Labs Lab 05/27/13 2120  AST 34  ALT 25  ALKPHOS 67  BILITOT 0.6  PROT 6.9  ALBUMIN 3.8   No results found for this basename: LIPASE, AMYLASE,  in the last 168 hours No results found for this basename: AMMONIA,  in the last 168 hours CBC:  Recent Labs Lab 05/27/13 2120 05/28/13 0500  WBC 9.9 8.1  NEUTROABS 6.3  --   HGB 9.6* 8.5*  HCT 28.6* 25.5*  MCV 102.5* 102.4*  PLT 283 221   Cardiac Enzymes:  Recent Labs Lab 05/27/13 2120  TROPONINI <0.30    BNP (last 3 results) No results found for this basename: PROBNP,  in the last 8760 hours CBG: No results found for this basename: GLUCAP,  in the last 168 hours  Recent Results (from the past 240 hour(s))  SURGICAL PCR SCREEN     Status: None   Collection Time    05/28/13  7:39 AM      Result Value Range Status   MRSA, PCR NEGATIVE  NEGATIVE Final   Staphylococcus aureus NEGATIVE  NEGATIVE Final   Comment:            The Xpert SA Assay (FDA     approved for NASAL specimens     in patients over 43 years of age),     is one component of     a comprehensive surveillance     program.  Test performance has     been validated by [employer] for patients greater     than or equal to 78 year old.     It is not intended     to diagnose infection nor to     guide or monitor treatment.     Studies: Dg Hip Complete Right  05/27/2013   CLINICAL DATA:  Larey Seat  EXAM: RIGHT  HIP - COMPLETE 2+ VIEW  COMPARISON:  04/10/2013  FINDINGS: Femoral and acetabular components of hip arthroplasty project in expected location. There is an oblique fracture of the femur at the level of the tip of the femoral stem, with several cm of overriding of fracture fragments and lateral angulation of the distal shaft fragment.  IMPRESSION: 1. Negative hip. 2. Femur fracture at the level of the tip of the femoral stem, with angulation and foreshortening. .   Electronically Signed   By: Oley Balm M.D.   On: 05/27/2013 20:34   Dg Chest Portable 1 View  05/28/2013   CLINICAL DATA:  Preop evaluation.  EXAM: PORTABLE CHEST - 1 VIEW  COMPARISON:  04/09/2011  FINDINGS: Single view of the chest was obtained. There is a triangular-shaped density in the left lower chest that roughly measures 8 mm. This density is in the region of overlying ribs. Otherwise, the lungs are clear without airspace disease or edema. Heart and mediastinum are within normal limits. The trachea is midline. Cortical thickening in the lateral left  clavicle suggest a previous injury and there is stable widening of the left AC joint.  IMPRESSION: Indeterminate 8 mm density in the left lower chest. Findings could be associated with overlying shadows or ribs but cannot exclude a pulmonary nodule. This area can be more definitively evaluated with CT.  No evidence for active chest disease.  These results will be called to the ordering clinician or representative by the Radiologist Assistant, and communication documented in the PACS Dashboard.   Electronically Signed   By: Richarda Overlie M.D.   On: 05/28/2013 07:36    Scheduled Meds: . ferrous sulfate  325 mg Oral TID PC  . potassium chloride  10 mEq Intravenous Q1 Hr x 4   Continuous Infusions: . sodium chloride 0.9 % 1,000 mL with potassium chloride 40 mEq infusion 75 mL/hr at 05/28/13 1007    Principal Problem:   Hip fracture    Time spent:    [facility]  [facility] Pager [phone]. If 7PM-7AM, please contact night-coverage at [email or web address], password [facility] 05/28/2013, 1:57 PM  LOS: 1 day

## 2013-05-28 NOTE — Transfer of Care (Signed)
Immediate Anesthesia Transfer of Care Note  Patient: Jaime Phillips  Procedure(s) Performed: Procedure(s) (LRB): OPEN REDUCTION INTERNAL FIXATION (ORIF) PERIPROSTHETIC FRACTURE (Right)  Patient Location: PACU  Anesthesia Type: General  Level of Consciousness: sedated, patient cooperative and responds to stimulation  Airway & Oxygen Therapy: Patient Spontanous Breathing and Patient connected to face mask oxgen  Post-op Assessment: Report given to PACU RN and Post -op Vital signs reviewed and stable  Post vital signs: Reviewed and stable  Complications: No apparent anesthesia complications

## 2013-05-28 NOTE — Care Management Note (Signed)
    Page 1 of 1   05/28/2013     11:57:00 AM   CARE MANAGEMENT NOTE 05/28/2013  Patient:  Jaime Phillips, Jaime Phillips   Account Number:  [id number]  Date Initiated:  05/28/2013  Documentation initiated by:  Lanier Clam  Subjective/Objective Assessment:   51 Y/O F ADMITTED W/R HIP PAIN.     Action/Plan:   FROM HOME,ALONE.NO PCP.NO HEALTH INSURANCE.HAS CANE,RW.   Anticipated DC Date:  05/30/2013   Anticipated DC Plan:  HOME W HOME HEALTH SERVICES  In-house referral  Financial Counselor      DC Planning Services  CM consult      Choice offered to / List presented to:  C-1 Patient           Status of service:  In process, will continue to follow Medicare Important Message given?   (If response is "NO", the following Medicare IM given date fields will be blank) Date Medicare IM given:   Date Additional Medicare IM given:    Discharge Disposition:    Per UR Regulation:  Reviewed for med. necessity/level of care/duration of stay  If discussed at Long Length of Stay Meetings, dates discussed:    Comments:  05/28/13 Esparanza Krider RN,BSN NCM [phone] FOR SX TODAY.PROVIDED W/HHC AGENCY LIST,PCP LISTING,HEALTH INSURANCE WEBSITE,& OTHER COMMUNITY RESOURCES, $[facility] MED LIST.WILL AWAIT PT RECOMMENDATIONS.

## 2013-05-28 NOTE — Progress Notes (Signed)
Patient alert and oriented, transported to the OR via bed by OR transporter. No distress noted prior to transporting patient to OR.

## 2013-05-28 NOTE — Consult Note (Signed)
Reason for Consult: Right Proximal femur Fracture. Referring Physician: EDP  Therma Lasure is an 52 y.o. female.  HPI: Jaime Phillips was seen and examined this am. She was admitted to [facility] during the night after suffer from a fall. She fell on her right hip when walking through her kitchen. She immediate pain in the hip region. A month ago she underwent THA by Dr. Charlann Boxer  On the right side. No change in LOC during the fall. No other trauma reported. Denies SOB, CO or Calf pain.   Past Medical History  Diagnosis Date  . Abnormal uterine bleeding   . Depression   . Arthritis   . Rectal bleeding     minor     Past Surgical History  Procedure Laterality Date  . Appendectomy    . Oophorectomy    . Shoulder surgery      left shoulder  . Colonoscopy    . 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 Diabetes Mother   . Kidney disease Mother   . Heart disease Father   . Colon cancer Father     "father may have had colon cancer"  . Cancer Sister     breast    Social History:  reports that she has been smoking Cigarettes.  She has a 12.5 pack-year smoking history. She has never used smokeless tobacco. She reports that she drinks about 1.8 ounces of alcohol per week. She reports that she does not use illicit drugs.  Allergies:  Allergies  Allergen Reactions  . Codeine Other (See Comments)    hallucinating  . Morphine And Related     Made head feel funny    Medications: I have reviewed the patient's current medications.  Results for orders placed during the hospital encounter of 05/27/13 (from the past 48 hour(s))  CBC WITH DIFFERENTIAL     Status: Abnormal   Collection Time    05/27/13  9:20 PM      Result Value Range   WBC 9.9  4.0 - 10.5 K/uL   RBC 2.79 (*) 3.87 - 5.11 MIL/uL   Hemoglobin 9.6 (*) 12.0 - 15.0 g/dL   HCT 04.5 (*) 40.9 - 81.1 %   MCV 102.5 (*) 78.0 - 100.0 fL   MCH 34.4 (*) 26.0 - 34.0 pg   MCHC 33.6  30.0 - 36.0 g/dL   RDW 91.4  78.2 - 95.6 %   Platelets 283  150 - 400 K/uL   Neutrophils Relative % 63  43 - 77 %   Neutro Abs 6.3  1.7 - 7.7 K/uL   Lymphocytes Relative 24  12 - 46 %   Lymphs Abs 2.4  0.7 - 4.0 K/uL   Monocytes Relative 13 (*) 3 - 12 %   Monocytes Absolute 1.2 (*) 0.1 - 1.0 K/uL   Eosinophils Relative 0  0 - 5 %   Eosinophils Absolute 0.0  0.0 - 0.7 K/uL   Basophils Relative 0  0 - 1 %   Basophils Absolute 0.0  0.0 - 0.1 K/uL  COMPREHENSIVE METABOLIC PANEL     Status: Abnormal   Collection Time    05/27/13  9:20 PM      Result Value Range   Sodium 136  135 - 145 mEq/L   Potassium  2.9 (*) 3.5 - 5.1 mEq/L   Chloride 99  96 - 112 mEq/L   CO2 24  19 - 32 mEq/L   Glucose, Bld 101 (*) 70 - 99 mg/dL   BUN 10  6 - 23 mg/dL   Creatinine, Ser 1.61 (*) 0.50 - 1.10 mg/dL   Calcium 9.4  8.4 - 09.6 mg/dL   Total Protein 6.9  6.0 - 8.3 g/dL   Albumin 3.8  3.5 - 5.2 g/dL   AST 34  0 - 37 U/L   ALT 25  0 - 35 U/L   Alkaline Phosphatase 67  39 - 117 U/L   Total Bilirubin 0.6  0.3 - 1.2 mg/dL   GFR calc non Af Amer >90  >90 mL/min   GFR calc Af Amer >90  >90 mL/min   Comment: (NOTE)     The eGFR has been calculated using the CKD EPI equation.     This calculation has not been validated in all clinical situations.     eGFR's persistently <90 mL/min signify possible Chronic Kidney     Disease.  PROTIME-INR     Status: None   Collection Time    05/27/13  9:20 PM      Result Value Range   Prothrombin Time 12.7  11.6 - 15.2 seconds   INR 0.97  0.00 - 1.49  TROPONIN I     Status: None   Collection Time    05/27/13  9:20 PM      Result Value Range   Troponin I <0.30  <0.30 ng/mL   Comment:            Due to the release kinetics of cTnI,     a negative result within the first hours     of the onset of symptoms does not rule out      myocardial infarction with certainty.     If myocardial infarction is still suspected,     repeat the test at appropriate intervals.  TYPE AND SCREEN     Status: None   Collection Time    05/27/13 10:22 PM      Result Value Range   ABO/RH(D) A POS     Antibody Screen NEG     Sample Expiration 05/30/2013    URINALYSIS, ROUTINE W REFLEX MICROSCOPIC     Status: Abnormal   Collection Time    05/28/13  1:17 AM      Result Value Range   Color, Urine AMBER (*) YELLOW   Comment: BIOCHEMICALS MAY BE AFFECTED BY COLOR   APPearance CLOUDY (*) CLEAR   Specific Gravity, Urine 1.032 (*) 1.005 - 1.030   pH 6.0  5.0 - 8.0   Glucose, UA NEGATIVE  NEGATIVE mg/dL   Hgb urine dipstick NEGATIVE  NEGATIVE   Bilirubin Urine NEGATIVE  NEGATIVE   Ketones, ur NEGATIVE  NEGATIVE mg/dL   Protein, ur 30 (*) NEGATIVE mg/dL   Urobilinogen, UA 1.0  0.0 - 1.0 mg/dL   Nitrite NEGATIVE  NEGATIVE   Leukocytes, UA SMALL (*) NEGATIVE  URINE MICROSCOPIC-ADD ON     Status: Abnormal   Collection Time    05/28/13  1:17 AM      Result Value Range   Squamous Epithelial / LPF MANY (*) RARE   WBC, UA 3-6  <3 WBC/hpf   RBC / HPF 0-2  <3 RBC/hpf   Bacteria, UA RARE  RARE   Urine-Other MUCOUS PRESENT    CBC  Status: Abnormal   Collection Time    05/28/13  5:00 AM      Result Value Range   WBC 8.1  4.0 - 10.5 K/uL   RBC 2.49 (*) 3.87 - 5.11 MIL/uL   Hemoglobin 8.5 (*) 12.0 - 15.0 g/dL   HCT 25.3 (*) 66.4 - 40.3 %   MCV 102.4 (*) 78.0 - 100.0 fL   MCH 34.1 (*) 26.0 - 34.0 pg   MCHC 33.3  30.0 - 36.0 g/dL   RDW 47.4  25.9 - 56.3 %   Platelets 221  150 - 400 K/uL  BASIC METABOLIC PANEL     Status: Abnormal   Collection Time    05/28/13  5:00 AM      Result Value Range   Sodium 134 (*) 135 - 145 mEq/L   Potassium 2.9 (*) 3.5 - 5.1 mEq/L   Chloride 98  96 - 112 mEq/L   CO2 26  19 - 32 mEq/L   Glucose, Bld 101 (*) 70 - 99 mg/dL   BUN 8  6 - 23 mg/dL   Creatinine, Ser 8.75  0.50 - 1.10 mg/dL   Calcium 9.1  8.4  - 64.3 mg/dL   GFR calc non Af Amer >90  >90 mL/min   GFR calc Af Amer >90  >90 mL/min   Comment: (NOTE)     The eGFR has been calculated using the CKD EPI equation.     This calculation has not been validated in all clinical situations.     eGFR's persistently <90 mL/min signify possible Chronic Kidney     Disease.  RETICULOCYTES     Status: Abnormal   Collection Time    05/28/13  5:00 AM      Result Value Range   Retic Ct Pct 4.0 (*) 0.4 - 3.1 %   RBC. 2.45 (*) 3.87 - 5.11 MIL/uL   Retic Count, Manual 98.0  19.0 - 186.0 K/uL    Dg Hip Complete Right  05/27/2013   CLINICAL DATA:  Larey Seat  EXAM: RIGHT HIP - COMPLETE 2+ VIEW  COMPARISON:  04/10/2013  FINDINGS: Femoral and acetabular components of hip arthroplasty project in expected location. There is an oblique fracture of the femur at the level of the tip of the femoral stem, with several cm of overriding of fracture fragments and lateral angulation of the distal shaft fragment.  IMPRESSION: 1. Negative hip. 2. Femur fracture at the level of the tip of the femoral stem, with angulation and foreshortening. .   Electronically Signed   By: Oley Balm M.D.   On: 05/27/2013 20:34    Review of Systems  Constitutional: Negative.   HENT: Negative.   Eyes: Negative.   Respiratory: Negative.   Cardiovascular: Negative.   Gastrointestinal: Negative.   Genitourinary: Negative.   Musculoskeletal: Positive for falls and joint pain.  Skin: Negative.   Neurological: Negative.   Endo/Heme/Allergies: Negative.   Psychiatric/Behavioral: Positive for depression.   Blood pressure 132/84, pulse 64, temperature 98.3 F (36.8 C), temperature source Oral, resp. rate 16, height 5\' 3"  (1.6 m), weight 61.236 kg (135 lb), SpO2 99.00%. Physical Exam  Constitutional: She is oriented to person, place, and time. She appears well-developed and well-nourished.  HENT:  Head: Normocephalic and atraumatic.  Eyes: EOM are normal.  Neck: Normal range of  motion.  Cardiovascular: Normal rate and intact distal pulses.   Respiratory: Effort normal.  GI: Soft.  Genitourinary:  Deferred  Musculoskeletal: She exhibits edema and tenderness.  Right LE externally rotated and shortened. Edema to Right hip region. Sensation intact. Tender and soft to palpation.  Neurological: She is alert and oriented to person, place, and time.  Skin: Skin is warm and dry.  Psychiatric:  depressed    Assessment/Plan: Right Proximal peri prosthetic femur fracture: Plan for surgical intervention by Dr. Charlann Boxer.  Continue current care. SCD's [place]ucks traction to right LE 5lbs. IS NPO   Macrocytic anemia: Hospitalist managing. continue Monitor  STILWELL, BRYSON L 05/28/2013, 7:21 AM     Agree with the above consult note done by Arsenio Loader, PA-C.  Reviewed with patient the extent of her fracture and the hope and intent of upcoming surgery Plan is to go to the OR today for ORIF of her right hip versus need to revise her femoral component  She had been doing well in her post-operative course following her recent THR for femoral neck fracture until this recently reported falls  Consent ordered

## 2013-05-28 NOTE — Progress Notes (Signed)
Clinical Social Work Department CLINICAL SOCIAL WORK PLACEMENT NOTE 05/28/2013  Patient:  Jaime Phillips, Jaime Phillips  Account Number:  [id number] Admit date:  05/27/2013  Clinical Social Worker:  Cori Razor, LCSW  Date/time:  05/28/2013 04:08 PM  Clinical Social Work is seeking post-discharge placement for this patient at the following level of care:   SKILLED NURSING   (*CSW will update this form in Epic as items are completed)     Patient/family provided with [facility] Department of Clinical Social Work's list of facilities offering this level of care within the geographic area requested by the patient (or if unable, by the patient's family).  05/28/2013  Patient/family informed of their freedom to choose among providers that offer the needed level of care, that participate in Medicare, Medicaid or managed care program needed by the patient, have an available bed and are willing to accept the patient.    Patient/family informed of [facility]' ownership interest in [facility], as well as of the fact that they are under no obligation to receive care at this facility.  PASARR submitted to EDS on 05/21/2013 PASARR number received from E[employer] on 05/28/2013  FL2 transmitted to all facilities in geographic area requested by pt/family on  05/28/2013 FL2 transmitted to all facilities within larger geographic area on   Patient informed that his/her managed care company has contracts with or will negotiate with  certain facilities, including the following:     Patient/family informed of bed offers received:   Patient chooses bed at  Physician recommends and patient chooses bed at    Patient to be transferred to  on   Patient to be transferred to facility by   The following physician request were entered in Epic:   Additional Comments:  Cori Razor LCSW [phone]

## 2013-05-29 ENCOUNTER — Encounter (HOSPITAL_COMMUNITY): Payer: Self-pay | Admitting: Orthopedic Surgery

## 2013-05-29 ENCOUNTER — Inpatient Hospital Stay (HOSPITAL_COMMUNITY): Payer: No Typology Code available for payment source

## 2013-05-29 LAB — CBC
MCH: 34.6 pg — ABNORMAL HIGH (ref 26.0–34.0)
MCV: 102.8 fL — ABNORMAL HIGH (ref 78.0–100.0)
Platelets: 247 10*3/uL (ref 150–400)
RDW: 13.4 % (ref 11.5–15.5)
WBC: 7.5 10*3/uL (ref 4.0–10.5)

## 2013-05-29 LAB — BASIC METABOLIC PANEL
Calcium: 8.9 mg/dL (ref 8.4–10.5)
Chloride: 102 mEq/L (ref 96–112)
Creatinine, Ser: 0.47 mg/dL — ABNORMAL LOW (ref 0.50–1.10)
GFR calc Af Amer: >90 mL/min (ref >90)

## 2013-05-29 LAB — URINE CULTURE: Culture: NO GROWTH

## 2013-05-29 LAB — RAPID HIV SCREEN (WH-MAU): Rapid HIV Screen: NONREACTIVE

## 2013-05-29 MED ORDER — RANITIDINE HCL 150 MG/10ML PO SYRP
150.0000 mg | ORAL_SOLUTION | Freq: Two times a day (BID) | ORAL | Status: DC
  Administered 2013-05-29 – 2013-05-31 (×5): 150 mg via ORAL
  Filled 2013-05-29 (×6): qty 10

## 2013-05-29 MED ORDER — ENOXAPARIN SODIUM 40 MG/0.4ML ~~LOC~~ SOLN
40.0000 mg | SUBCUTANEOUS | Status: DC
  Administered 2013-05-29 – 2013-05-31 (×3): 40 mg via SUBCUTANEOUS
  Filled 2013-05-29 (×3): qty 0.4

## 2013-05-29 MED ORDER — LORAZEPAM 0.5 MG PO TABS
0.5000 mg | ORAL_TABLET | Freq: Three times a day (TID) | ORAL | Status: DC | PRN
  Administered 2013-05-29 – 2013-05-30 (×3): 0.5 mg via ORAL
  Filled 2013-05-29 (×3): qty 1

## 2013-05-29 MED ORDER — SENNOSIDES-DOCUSATE SODIUM 8.6-50 MG PO TABS
1.0000 | ORAL_TABLET | Freq: Two times a day (BID) | ORAL | Status: DC
  Administered 2013-05-29 – 2013-05-31 (×5): 1 via ORAL
  Filled 2013-05-29 (×7): qty 1

## 2013-05-29 MED ORDER — PANTOPRAZOLE SODIUM 40 MG PO TBEC
40.0000 mg | DELAYED_RELEASE_TABLET | Freq: Every day | ORAL | Status: DC
  Administered 2013-05-29 – 2013-05-31 (×3): 40 mg via ORAL
  Filled 2013-05-29 (×3): qty 1

## 2013-05-29 MED ORDER — OXYCODONE-ACETAMINOPHEN 5-325 MG PO TABS
1.0000 | ORAL_TABLET | ORAL | Status: DC | PRN
  Administered 2013-05-29 – 2013-05-31 (×10): 2 via ORAL
  Filled 2013-05-29 (×10): qty 2

## 2013-05-29 MED ORDER — NICOTINE 14 MG/24HR TD PT24
14.0000 mg | MEDICATED_PATCH | Freq: Every day | TRANSDERMAL | Status: DC
  Administered 2013-05-29: 17:00:00 14 mg via TRANSDERMAL
  Filled 2013-05-29 (×3): qty 1

## 2013-05-29 NOTE — Consult Note (Signed)
Physical Medicine and Rehabilitation Consult Reason for Consult: Right Peri Prosthetic proximal femur fracture Referring Physician:    HPI: Jaime Phillips is a 52 y.o. right-handed female with history of right total hip replacement anterior direct approach secondary to osteoarthritis 04/10/2013 and discharged to home 04/13/2013 with home therapies. Admitted 05/27/2013 after a fall landing on the right hip while walking through the kitchen. X-ray and imaging revealed right periprosthetic proximal femur fracture with stable prosthesis. Underwent ORIF 05/28/2013 per Dr. Charlann Boxer. Advised nonweightbearing right lower extremity. Placed on subcutaneous Lovenox for DVT prophylaxis. Acute blood loss anemia 6.7 and transfused 05/30/2013. Physical and occupational therapy evaluations completed 05/29/2013 with recommendations for physical medicine rehabilitation consult to consider inpatient rehabilitation services.   Review of Systems  Gastrointestinal:       GERD  Musculoskeletal: Positive for joint pain and myalgias.  Psychiatric/Behavioral: Positive for depression.  All other systems reviewed and are negative.   Past Medical History  Diagnosis Date  . Abnormal uterine bleeding   . Depression   . Arthritis   . Rectal bleeding     minor    Past Surgical History  Procedure Laterality Date  . Appendectomy    . Oophorectomy    . Shoulder surgery      left shoulder  . Colonoscopy    . Total hip arthroplasty Right 04/10/2013    Procedure: RIGHT TOTAL HIP ARTHROPLASTY ANTERIOR APPROACH;  Surgeon: Shelda Pal, MD;  Location: [facility];  Service: Orthopedics;  Laterality: Right;  . Orif periprosthetic fracture Right 05/28/2013    Procedure: OPEN REDUCTION INTERNAL FIXATION (ORIF) PERIPROSTHETIC FRACTURE;  Surgeon: Shelda Pal, MD;  Location: [facility];  Service: Orthopedics;  Laterality: Right;   Family History  Problem Relation Age of Onset  . Diabetes Mother   . Kidney disease Mother   . Heart  disease Father   . Colon cancer Father     "father may have had colon cancer"  . Cancer Sister     breast   Social History:  reports that she has been smoking Cigarettes.  She has a 12.5 pack-year smoking history. She has never used smokeless tobacco. She reports that she drinks about 1.8 ounces of alcohol per week. She reports that she does not use illicit drugs. Allergies:  Allergies  Allergen Reactions  . Codeine Other (See Comments)    hallucinating  . Morphine And Related     Made head feel funny   Medications Prior to Admission  Medication Sig Dispense Refill  . acetaminophen (TYLENOL) 500 MG tablet Take 500 mg by mouth every 6 (six) hours as needed (pain). For pain.      . Coenzyme Q10 (CO Q 10 PO) Take by mouth daily.      . ferrous sulfate 325 (65 FE) MG tablet Take 1 tablet (325 mg total) by mouth 3 (three) times daily after meals.    3  . methocarbamol (ROBAXIN) 500 MG tablet Take 1 tablet (500 mg total) by mouth every 6 (six) hours as needed (muscle spasms).  50 tablet  0  . Multiple Vitamin (MULTIVITAMIN WITH MINERALS) TABS tablet Take 1 tablet by mouth daily.      Marland Kitchen oxyCODONE (OXY IR/ROXICODONE) 5 MG immediate release tablet Take 1-2 tablets (5-10 mg total) by mouth every 4 (four) hours as needed for pain.  60 tablet  0  . ranitidine (ZANTAC) 150 MG tablet Take 1 tablet (150 mg total) by mouth 2 (two) times daily.      Marland Kitchen  vitamin E 400 UNIT capsule Take 400 Units by mouth daily.        Home: Home Living Family/patient expects to be discharged to:: Private residence Living Arrangements: Alone;Non-relatives/Friends Available Help at Discharge: Family;Available PRN/intermittently Type of Home: House Home Access: Stairs to enter [employer] of Steps: 2 Entrance Stairs-Rails: None Home Layout: One level Home Equipment: Bedside commode;Eye - 2 wheels;Adaptive equipment Adaptive Equipment: Reacher;Sock aid;Long-handled shoe horn;Long-handled sponge   Functional History:   Functional Status:  Mobility: Bed Mobility Bed Mobility: Sit to Supine Supine to Sit: 3: Mod assist Sit to Supine: 3: Mod assist Transfers Transfers: Sit to Stand;Stand to Sit Sit to Stand: From chair/3-in-1;3: Mod assist;With upper extremity assist;With armrests Sit to Stand: Patient Percentage: 60% Stand to Sit: To bed;With upper extremity assist;3: Mod assist Stand Pivot Transfers: 3: Mod assist Ambulation/Gait Ambulation/Gait Assistance: 1: +2 Total assist (for safety) Ambulation/Gait: Patient Percentage: 60% Ambulation Distance (Feet): 5 Feet Assistive device: Rolling Greenstein Gait Pattern: Step-to pattern;Trunk flexed Gait velocity: decreased    ADL: ADL Eating/Feeding: Simulated;Independent Where Assessed - Eating/Feeding: Bed level Grooming: Performed;Wash/dry face;Brushing hair;Set up Where Assessed - Grooming: Unsupported sitting Upper Body Bathing: Performed;Chest;Right arm;Left arm;Abdomen;Set up;Supervision/safety Where Assessed - Upper Body Bathing: Unsupported sitting Lower Body Bathing: +2 Total assistance Where Assessed - Lower Body Bathing: Supported sit to stand Upper Body Dressing: Performed;Supervision/safety;Set up Where Assessed - Upper Body Dressing: Unsupported sitting Lower Body Dressing: Simulated;+2 Total assistance (without AE) Where Assessed - Lower Body Dressing: Supported sit to stand Toilet Transfer: Simulated;+2 Total assistance Toilet Transfer Method: Stand pivot Equipment Used: Rolling Castrillo;Gait belt ADL Comments: Pt sat with R LE supported on bed and L LE over EOB to wash UB and groom. Stood at [facility] with therapists assist to wash periareas and did well with NWB on R LE. Pivot around to chair to sit up. Pt with some fatigue after grooming, bathing and functional transfer. Pt has used AE PTA so she is familiar with use. Pt is motivated to do for herself and wanted to do as much as she could this session.    Cognition: Cognition Overall Cognitive Status: Within Functional Limits for tasks assessed Orientation Level: Oriented X4 Cognition Arousal/Alertness: Awake/alert Behavior During Therapy: WFL for tasks assessed/performed Overall Cognitive Status: Within Functional Limits for tasks assessed  Blood pressure 108/68, pulse 89, temperature 99.1 F (37.3 C), temperature source Oral, resp. rate 16, height 5\' 3"  (1.6 m), weight 61.236 kg (135 lb), SpO2 99.00%. Physical Exam  Constitutional: She is oriented to person, place, and time. She appears well-developed.  HENT:  Head: Normocephalic.  Eyes: EOM are normal.  Neck: Normal range of motion. Neck supple. No thyromegaly present.  Cardiovascular: Normal rate and regular rhythm.   Respiratory: Effort normal and breath sounds normal. No respiratory distress.  GI: Soft. Bowel sounds are normal. She exhibits no distension.  Musculoskeletal:  Right hip incision dressed. Initial incision well healed. RLE with 2+ edema. Appropriately tender. Able to move left hip,LE without substantial pain today.  Neurological: She is alert and oriented to person, place, and time.  Patient follows full commands. She is a bit anxious during exam. Limbs NVI  Skin:  Hip incision is dressed and appropriately tender  Psychiatric: She has a normal mood and affect. Her behavior is normal. Judgment and thought content normal.    Results for orders placed during the hospital encounter of 05/27/13 (from the past 24 hour(s))  CBC     Status: Abnormal   Collection Time  05/29/13  4:20 AM      Result Value Range   WBC 7.5  4.0 - 10.5 K/uL   RBC 2.14 (*) 3.87 - 5.11 MIL/uL   Hemoglobin 7.4 (*) 12.0 - 15.0 g/dL   HCT 78.4 (*) 69.6 - 29.5 %   MCV 102.8 (*) 78.0 - 100.0 fL   MCH 34.6 (*) 26.0 - 34.0 pg   MCHC 33.6  30.0 - 36.0 g/dL   RDW 28.4  13.2 - 44.0 %   Platelets 247  150 - 400 K/uL  BASIC METABOLIC PANEL     Status: Abnormal   Collection Time    05/29/13   4:20 AM      Result Value Range   Sodium 134 (*) 135 - 145 mEq/L   Potassium 4.7  3.5 - 5.1 mEq/L   Chloride 102  96 - 112 mEq/L   CO2 23  19 - 32 mEq/L   Glucose, Bld 143 (*) 70 - 99 mg/dL   BUN 4 (*) 6 - 23 mg/dL   Creatinine, Ser 1.02 (*) 0.50 - 1.10 mg/dL   Calcium 8.9  8.4 - 72.5 mg/dL   GFR calc non Af Amer >90  >90 mL/min   GFR calc Af Amer >90  >90 mL/min  RAPID HIV SCREEN [facility])     Status: None   Collection Time    05/29/13 10:50 AM      Result Value Range   SUDS Rapid HIV Screen NON REACTIVE  NON REACTIVE   Dg Chest 2 View  05/29/2013   CLINICAL DATA:  Left chest pain under breast  EXAM: CHEST  2 VIEW  COMPARISON:  Prior chest x-ray 05/28/2013  FINDINGS: The lungs are clear and negative for focal airspace consolidation, pulmonary edema or suspicious pulmonary nodule. The previously identified nonspecific nodular opacity in the left lung base is not well seen on the present examination and is therefore favored to have reflected the costochondral junction of the left 6th rib. No pleural effusion or pneumothorax. Cardiac and mediastinal contours are within normal limits. No acute fracture or lytic or blastic osseous lesions. Residual degenerative changes at the left coracoclavicular and acromioclavicular joints. Query history of remote prior partial surgical resection versus AC joint injury. The visualized upper abdominal bowel gas pattern is unremarkable.  IMPRESSION: No active cardiopulmonary disease.   Electronically Signed   By: Malachy Moan M.D.   On: 05/29/2013 08:22   Dg Hip Complete Right  05/27/2013   CLINICAL DATA:  Larey Seat  EXAM: RIGHT HIP - COMPLETE 2+ VIEW  COMPARISON:  04/10/2013  FINDINGS: Femoral and acetabular components of hip arthroplasty project in expected location. There is an oblique fracture of the femur at the level of the tip of the femoral stem, with several cm of overriding of fracture fragments and lateral angulation of the distal shaft fragment.   IMPRESSION: 1. Negative hip. 2. Femur fracture at the level of the tip of the femoral stem, with angulation and foreshortening. .   Electronically Signed   By: Oley Balm M.D.   On: 05/27/2013 20:34   Dg Pelvis Portable  05/28/2013   CLINICAL DATA:  Post right femoral fracture  EXAM: PORTABLE PELVIS  COMPARISON:  Portable exam 2115 hr compared to 05/27/2013  FINDINGS: Acetabular and femoral components of a right hip prosthesis are again identified.  Oblique displaced proximal right femoral diaphyseal fracture seen on previous exam has been reduced.  A lateral plate, multiple screws and multiple cerclage wires have been  placed at the right femur.  Bones appear demineralized.  Degenerative changes left hip.  IMPRESSION: Post ORIF right femur.   Electronically Signed   By: Ulyses Southward M.D.   On: 05/28/2013 21:25   Dg Chest Portable 1 View  05/28/2013   CLINICAL DATA:  Preop evaluation.  EXAM: PORTABLE CHEST - 1 VIEW  COMPARISON:  04/09/2011  FINDINGS: Single view of the chest was obtained. There is a triangular-shaped density in the left lower chest that roughly measures 8 mm. This density is in the region of overlying ribs. Otherwise, the lungs are clear without airspace disease or edema. Heart and mediastinum are within normal limits. The trachea is midline. Cortical thickening in the lateral left clavicle suggest a previous injury and there is stable widening of the left AC joint.  IMPRESSION: Indeterminate 8 mm density in the left lower chest. Findings could be associated with overlying shadows or ribs but cannot exclude a pulmonary nodule. This area can be more definitively evaluated with CT.  No evidence for active chest disease.  These results will be called to the ordering clinician or representative by the Radiologist Assistant, and communication documented in the PACS Dashboard.   Electronically Signed   By: Richarda Overlie M.D.   On: 05/28/2013 07:36    Assessment/Plan: Diagnosis: right  periprosthetic hip fx after fall. Recent right THA. OA of left hip 1. Does the need for close, 24 hr/day medical supervision in concert with the patient's rehab needs make it unreasonable for this patient to be served in a less intensive setting? Yes 2. Co-Morbidities requiring supervision/potential complications: ABLA 3. Due to bladder management, bowel management, safety, skin/wound care, disease management, medication administration, pain management and patient education, does the patient require 24 hr/day rehab nursing? Yes 4. Does the patient require coordinated care of a physician, rehab nurse, PT (1-2 hrs/day, 5 days/week) and OT (1-2 hrs/day, 5 days/week) to address physical and functional deficits in the context of the above medical diagnosis(es)? Yes Addressing deficits in the following areas: balance, endurance, locomotion, strength, transferring, bowel/bladder control, bathing, dressing, feeding, grooming and toileting 5. Can the patient actively participate in an intensive therapy program of at least 3 hrs of therapy per day at least 5 days per week? Yes 6. The potential for patient to make measurable gains while on inpatient rehab is excellent 7. Anticipated functional outcomes upon discharge from inpatient rehab are mod I with PT, mod I with OT, n/a with SLP. 8. Estimated rehab length of stay to reach the above functional goals is: 7-10 days 9. Does the patient have adequate social supports to accommodate these discharge functional goals? Yes 10. Anticipated D/C setting: Home 11. Anticipated post D/C treatments: HH therapy 12. Overall Rehab/Functional Prognosis: excellent  RECOMMENDATIONS: This patient's condition is appropriate for continued rehabilitative care in the following setting: CIR Patient has agreed to participate in recommended program. Yes Note that insurance prior authorization may be required for reimbursement for recommended care.  Comment: Rehab RN to follow  up.   Ranelle Oyster, MD, [place]     05/29/2013

## 2013-05-29 NOTE — Progress Notes (Signed)
   Subjective: 1 Day Post-Op Procedure(s) (LRB): OPEN REDUCTION INTERNAL FIXATION (ORIF) PERIPROSTHETIC FRACTURE (Right)   Patient reports pain as mild, pain controlled. No events throughout the night.  States she doesn't know how much she wil be able to do with PT since her left knee has been hurting her. Discussed that she may need a facility after the hospital to get her up and going.  Objective:   VITALS:   Filed Vitals:   05/29/13 0610  BP: 100/60  Pulse: 96  Temp: 98.7 F (37.1 C)  Resp: 16    Neurovascular intact Dorsiflexion/Plantar flexion intact Incision: dressing C/D/I No cellulitis present Compartment soft  LABS  Recent Labs  05/27/13 2120 05/28/13 0500 05/29/13 0420  HGB 9.6* 8.5* 7.4*  HCT 28.6* 25.5* 22.0*  WBC 9.9 8.1 7.5  PLT 283 221 247     Recent Labs  05/27/13 2120 05/28/13 0500 05/29/13 0420  NA 136 134* 134*  K 2.9* 2.9* 4.7  BUN 10 8 4*  CREATININE 0.48* 0.51 0.47*  GLUCOSE 101* 101* 143*     Assessment/Plan: 1 Day Post-Op Procedure(s) (LRB): OPEN REDUCTION INTERNAL FIXATION (ORIF) PERIPROSTHETIC FRACTURE (Right) Foley cath d/c'ed Advance diet Up with therapy D/C IV fluids Discharge to SNF  ABLA  Treated with iron and will observe     Anastasio Auerbach. Marvin Maenza   PAC  05/29/2013, 8:15 AM

## 2013-05-29 NOTE — Evaluation (Signed)
Occupational Therapy Evaluation Patient Details Name: Jaime Phillips MRN: [id number] DOB: Nov 26, 1960 Today's Date: 05/29/2013 Time: 5284-1324 OT Time Calculation (min): 31 min  OT Assessment / Plan / Recommendation History of present illness pt admitted after fall, R periprosthetic femur fx. Had DATHA 9/9/14for R THA, anterior direct approach   Clinical Impression   Pt is highly motivated and participates in therapy to do as much as she can for herself. Will benefit from skilled OT services to maximize ADL independence for next venue of care.     OT Assessment  Patient needs continued OT Services    Follow Up Recommendations  CIR;Supervision/Assistance - 24 hour    Barriers to Discharge      Equipment Recommendations  None recommended by OT;Tub/shower bench    Recommendations for Other Services    Frequency  Min 2X/week    Precautions / Restrictions Precautions Precautions: Fall Restrictions Weight Bearing Restrictions: Yes RLE Weight Bearing: Non weight bearing   Pertinent Vitals/Pain 7/10 R hip; reposition    ADL  Eating/Feeding: Simulated;Independent Where Assessed - Eating/Feeding: Bed level Grooming: Performed;Wash/dry face;Brushing hair;Set up Where Assessed - Grooming: Unsupported sitting Upper Body Bathing: Performed;Chest;Right arm;Left arm;Abdomen;Set up;Supervision/safety Where Assessed - Upper Body Bathing: Unsupported sitting Lower Body Bathing: +2 Total assistance Lower Body Bathing: Patient Percentage: 50% Where Assessed - Lower Body Bathing: Supported sit to stand Upper Body Dressing: Performed;Supervision/safety;Set up Where Assessed - Upper Body Dressing: Unsupported sitting Lower Body Dressing: Simulated;+2 Total assistance (without AE) Lower Body Dressing: Patient Percentage: 50% Where Assessed - Lower Body Dressing: Supported sit to stand Toilet Transfer: Simulated;+2 Total assistance Toilet Transfer: Patient Percentage: 60% Statistician  Method: Stand pivot Toileting - Architect and Hygiene: Simulated;+2 Total assistance Toileting - Architect and Hygiene: Patient Percentage: 60% Where Assessed - Glass blower/designer Manipulation and Hygiene: Sit to stand from 3-in-1 or toilet Equipment Used: Rolling Stanis;Gait belt ADL Comments: Pt sat with R LE supported on bed and L LE over EOB to wash UB and groom. Stood at [facility] with therapists assist to wash periareas and did well with NWB on R LE. Pivot around to chair to sit up. Pt with slight fatigue after grooming, bathing and functional transfer. Pt has used AE PTA so she is familiar with use. Pt is motivated to do for herself and wanted to do as much as she could this session.     OT Diagnosis: Generalized weakness  OT Problem List: Decreased strength;Decreased knowledge of use of DME or AE;Decreased knowledge of precautions OT Treatment Interventions: Self-care/ADL training;DME and/or AE instruction;Therapeutic activities;Patient/family education   OT Goals(Current goals can be found in the care plan section) Acute Rehab OT Goals Patient Stated Goal: I want to walk again. OT Goal Formulation: With patient Time For Goal Achievement: 06/05/13 Potential to Achieve Goals: Good  Visit Information  Last OT Received On: 05/29/13 Assistance Needed: +2 PT/OT Co-Evaluation/Treatment: Yes History of Present Illness: pt admitted after fall, R periprosthetic femur fx. Had DATHA 9/9/14for R THA, anterior direct approach       Prior Functioning     Home Living Family/patient expects to be discharged to:: Private residence Living Arrangements: Alone;Non-relatives/Friends Available Help at Discharge: Family;Available PRN/intermittently Type of Home: House Home Access: Stairs to enter [employer] of Steps: 2 Entrance Stairs-Rails: None Home Layout: One level Home Equipment: Bedside commode;Mckendry - 2 wheels;Adaptive equipment Adaptive Equipment:  Reacher;Sock aid;Long-handled shoe horn;Long-handled sponge Prior Function Level of Independence: Needs assistance;Independent with assistive device(s) ADL's / Homemaking Assistance Needed: with  shower, used AE to dress PTA Communication Communication: No difficulties         Vision/Perception     Cognition  Cognition Arousal/Alertness: Awake/alert Behavior During Therapy: WFL for tasks assessed/performed Overall Cognitive Status: Within Functional Limits for tasks assessed    Extremity/Trunk Assessment Upper Extremity Assessment Upper Extremity Assessment: Overall WFL for tasks assessed Lower Extremity Assessment     Mobility Bed Mobility Bed Mobility: Supine to Sit Supine to Sit: 3: Mod assist Details for Bed Mobility Assistance: support of RLE to move to edge of bed. Transfers Sit to Stand: 1: +2 Total assist;From bed;From elevated surface;With upper extremity assist Sit to Stand: Patient Percentage: 60% Stand to Sit: With armrests;To chair/3-in-1;With upper extremity assist;3: Mod assist Details for Transfer Assistance: multimodal cues for sfaety, NWB on R Leg which pt was able to maintain.      Exercise     Balance Balance Balance Assessed: Yes Dynamic Standing Balance Dynamic Standing - Level of Assistance: 1: +2 Total assist (for safety to stand and wash periareas)   End of Session OT - End of Session Equipment Utilized During Treatment: Gait belt Activity Tolerance: Patient tolerated treatment well Patient left: in chair;with call bell/phone within reach  GO     [name] [phone] 05/29/2013, 12:31 PM

## 2013-05-29 NOTE — Significant Event (Signed)
D: 51yo [facility] Hosp[facility]#2 s/p fall with Rt periprosthetic fracture and POD#1 ORIF Rt periprosthetic fracture.  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  At approx 2030 pt states she took 2 Aleve that her visitor had brought.  Pt stated that she was hurting but knew it was not time for Percocet yet.   A: Reviewed with pt Hospital Policy re: outside medications.  Reviewed with pt that medications are to be administered by nursing staff only.  Discussed rational for policy re: medication administration.  Informed pt of concerns re: taking Aleve and also being on anticoagulation therapy i.e. Lovenox.  Explained to pt that MD, PharmD, and [facility] would be notified of event.  Instructed pt's visitor to take his medication back home with him and not bring it back to the hospital.   R: Pt verbalized understanding.  MD, PharmD, and [facility] made aware of situation.  Will cont to monitor pt status.   Marrion Coy RN

## 2013-05-29 NOTE — Op Note (Signed)
Jaime Phillips, NAM             ACCOUNT NO.:  [id number]  MEDICAL RECORD NO.:  [id number]  LOCATION:  1607                         FACILITY:  [facility]  PHYSICIAN:  Jaime Frankel. Charlann Phillips, M.D.  DATE OF BIRTH:  Jul 24, 1961  DATE OF PROCEDURE:  05/28/2013 DATE OF DISCHARGE:                              OPERATIVE REPORT   PREOPERATIVE DIAGNOSIS:  Closed right proximal femur periprosthetic fracture with apparent stable prosthesis.  POSTOPERATIVE DIAGNOSIS:  Closed right proximal femur periprosthetic fracture with apparent stable prosthesis.  PROCEDURE:  Open reduction and internal fixation of right periprosthetic femur fracture utilizing a 6 hole cable ready plate from [employer] with 4 cables utilized and 3 bicortical nonlocking cortical screws.  SURGEON:  Jaime Frankel. Charlann Boxer, MD  ASSISTANT:  Lanney Gins, PA-C.  Note that Mr. Jaime Phillips was present the entirety of the case for preoperative position, perioperative management of upper extremity, general facilitation of the case, primary wound closure.  ANESTHESIA:  General.  SPECIMENS:  None.  DRAINS:  None.  COMPLICATION:  None apparent.  BLOOD LOSS:  About 350 mL.  INDICATION FOR PROCEDURE:  Jaime Phillips is a 52 year old female, who unfortunately just over 7 or 8 weeks out from a right total hip arthroplasty performed through an anterior approach for right femoral neck fracture.  She unfortunately had some recent falls and subsequently over the weekend fell hard enough to injure her right leg.  She was brought to the emergency room, where radiographs revealed a long oblique fracture involving the lower portion of the femoral stem.  Review of her radiographs from her postoperative period and comparing them to her current injured films, it appears that there is no evidence of any femoral subsidence thus indicating a stable femoral prosthesis.  She had not had problems clinically as I had just seen her recently in the office and she had been  doing quite well, progressively working on strength and improving her functionality.  Unfortunately, this is the situation she is in and discussed the indications for and the need for open reduction and internal fixation and given this discussion, she was ready to proceed knowing that she needed to get relief for this and provide fixation and get the fracture to heel in order for immobilization.  Risks of infection, nonunion, potential need for future revision surgery of the femur were all discussed and reviewed.  Consent was obtained for benefit of fracture management.  PROCEDURE IN DETAIL:  The patient was brought to operative theater. Once adequate anesthesia, preoperative antibiotics, Ancef administered, she was positioned into the left lateral decubitus position with the right side up.  The right lower extremity was then prepped and draped in sterile fashion.  A time-out was performed identifying the patient, planned procedure, and extremity.  Lateral based incision was made from the trochanteric region distally to allow for open reduction and internal fixation.  The soft tissue dissection was carried to the iliotibial band which was then split.  The vastus lateralis was then elevated off the posterior intermuscular septum and elevated anteriorly. Fracture site was identified.  The intermuscular vascular structures as well as perforating vessels were all cauterized.  Upon identification of the fracture site with utilization of retraction from  Mr. Jaime Phillips as well as bone-holding clamps, I was able to reduce the fracture to an anatomic position.  There was a long oblique segment improving the chances of bony union in this fashion.  Once the fracture was reduced and held with a clamp, we selected a 6 hole Zimmer plate based on the proximity of the trochanter.  The 6 hole plate was placed on the lateral aspect of femur, first placed a cable proximally once I had the plate held in  position, reduced the bone. This was placed in central of the proximal most 3 holes.  Once this was cinched down to 70 units of pressure, was locked into place.  I then placed a bicortical screw distally.  At this point, all clamps were removed.  With the fracture maintained in its anatomically reduced position, I placed 2 other cables proximally and 2 other screws in the distal segment.  Once all were placed, I retensioned the cables and crimped them down to the plate cutting the cables.  I decided to place a 4th cable on the proximal aspect of the distal 3 holes to enhance the stability there.  At this point, the wound was irrigated with normal saline solution.  We reapproximated iliotibial band with #1 Vicryl.  The remaining wound was closed with 2-0 Vicryl and staples on the skin.  The skin was cleaned, dried, and dressed sterilely using Mepilex dressing.  She was then brought to the recovery room in stable condition, tolerated the procedure well, extubated.  She was placed in knee immobilizer.  We will probably have to keep her nonweightbearing for 4-6 weeks to get evidence of fracture healing and support the internal fixation of her prosthetic component.     Jaime Phillips, M.D.     MDO/MEDQ  D:  05/28/2013  T:  05/29/2013  Job:  [id number]

## 2013-05-29 NOTE — Progress Notes (Signed)
CSW assisting with d/c planning. Pt has one bed offer from [facility] in [place]. Pt prefers placement in [place] but understands this is not possible.  No [facility] will accept [facility] Letter of Guarantee at this time. CSW will continue to follow to assist with d/c planning needs.  Cori Razor LCSW [phone]

## 2013-05-29 NOTE — Progress Notes (Signed)
[facility] PROGRESS NOTE  Jaime Phillips XBJ:[id number] DOB: 08-25-1960 DOA: 05/27/2013 PCP: No primary provider on file.  Assessment/Plan: 1. Right hip fracture -s/p ORIF 10/27 -start lovenox for DVT prophylaxis post op -pain control -Pt/Ot -will likely need Rehab  2. Macrocytic anemia -anemia panel unremarkable -acute component from blood loss and hemodilution, transfuse if Hb trends down further -check HIV -FU with Hematology in 1 month  3. possible  8mm LLL density vs Rib shadow on Portable CXR -2 view CXR: nodule not seen, likely rib shadow  4.  Tobacco abuse  -counseled  5. Hypokalemia -replaced  6. GERD -add Zantac and PPI  DVT proph: lovenox   Code Status: Full Family Communication: none at bedside Disposition Plan: will likely need SNF   Consultants:  Ortho dr.Olin  HPI/Subjective: Some pain at the fracture site, lot of heartburn today  Objective: Filed Vitals:   05/29/13 0957  BP: 113/67  Pulse: 95  Temp: 98.9 F (37.2 C)  Resp: 16    Intake/Output Summary (Last 24 hours) at 05/29/13 1016 Last data filed at 05/29/13 3086  Gross per 24 hour  Intake   5190 ml  Output   2100 ml  Net   3090 ml   Filed Weights   05/27/13 1934  Weight: 61.236 kg (135 lb)    Exam:   General: AAOx3, no distress  Cardiovascular: S1s2/RRR  Respiratory: CTAB  Abdomen: soft, NT, BS present  Musculoskeletal: RLE ext rotated, distal pulses intact  Data Reviewed: Basic Metabolic Panel:  Recent Labs Lab 05/27/13 2120 05/28/13 0500 05/29/13 0420  NA 136 134* 134*  K 2.9* 2.9* 4.7  CL 99 98 102  CO2 24 26 23   GLUCOSE 101* 101* 143*  BUN 10 8 4*  CREATININE 0.48* 0.51 0.47*  CALCIUM 9.4 9.1 8.9   Liver Function Tests:  Recent Labs Lab 05/27/13 2120  AST 34  ALT 25  ALKPHOS 67  BILITOT 0.6  PROT 6.9  ALBUMIN 3.8   No results found for this basename: LIPASE, AMYLASE,  in the last 168 hours No results found for this basename:  AMMONIA,  in the last 168 hours CBC:  Recent Labs Lab 05/27/13 2120 05/28/13 0500 05/29/13 0420  WBC 9.9 8.1 7.5  NEUTROABS 6.3  --   --   HGB 9.6* 8.5* 7.4*  HCT 28.6* 25.5* 22.0*  MCV 102.5* 102.4* 102.8*  PLT 283 221 247   Cardiac Enzymes:  Recent Labs Lab 05/27/13 2120  TROPONINI <0.30   BNP (last 3 results) No results found for this basename: PROBNP,  in the last 8760 hours CBG: No results found for this basename: GLUCAP,  in the last 168 hours  Recent Results (from the past 240 hour(s))  URINE CULTURE     Status: None   Collection Time    05/28/13  1:16 AM      Result Value Range Status   Specimen Description URINE, CLEAN CATCH   Final   Special Requests NONE   Final   Culture  Setup Time     Final   Value: 05/28/2013 11:05     Performed at [employer] [facility] Count     Final   Value: NO GROWTH     Performed at [facility]   Culture     Final   Value: NO GROWTH     Performed at [facility]   Report Status 05/29/2013 FINAL   Final  SURGICAL PCR SCREEN  Status: None   Collection Time    05/28/13  7:39 AM      Result Value Range Status   MRSA, PCR NEGATIVE  NEGATIVE Final   Staphylococcus aureus NEGATIVE  NEGATIVE Final   Comment:            The Xpert SA Assay (FDA     approved for NASAL specimens     in patients over 16 years of age),     is one component of     a comprehensive surveillance     program.  Test performance has     been validated by [employer] for patients greater     than or equal to 49 year old.     It is not intended     to diagnose infection nor to     guide or monitor treatment.     Studies: Dg Chest 2 View  05/29/2013   CLINICAL DATA:  Left chest pain under breast  EXAM: CHEST  2 VIEW  COMPARISON:  Prior chest x-ray 05/28/2013  FINDINGS: The lungs are clear and negative for focal airspace consolidation, pulmonary edema or suspicious pulmonary nodule. The previously identified nonspecific  nodular opacity in the left lung base is not well seen on the present examination and is therefore favored to have reflected the costochondral junction of the left 6th rib. No pleural effusion or pneumothorax. Cardiac and mediastinal contours are within normal limits. No acute fracture or lytic or blastic osseous lesions. Residual degenerative changes at the left coracoclavicular and acromioclavicular joints. Query history of remote prior partial surgical resection versus AC joint injury. The visualized upper abdominal bowel gas pattern is unremarkable.  IMPRESSION: No active cardiopulmonary disease.   Electronically Signed   By: Malachy Moan M.D.   On: 05/29/2013 08:22   Dg Hip Complete Right  05/27/2013   CLINICAL DATA:  Larey Seat  EXAM: RIGHT HIP - COMPLETE 2+ VIEW  COMPARISON:  04/10/2013  FINDINGS: Femoral and acetabular components of hip arthroplasty project in expected location. There is an oblique fracture of the femur at the level of the tip of the femoral stem, with several cm of overriding of fracture fragments and lateral angulation of the distal shaft fragment.  IMPRESSION: 1. Negative hip. 2. Femur fracture at the level of the tip of the femoral stem, with angulation and foreshortening. .   Electronically Signed   By: Oley Balm M.D.   On: 05/27/2013 20:34   Dg Pelvis Portable  05/28/2013   CLINICAL DATA:  Post right femoral fracture  EXAM: PORTABLE PELVIS  COMPARISON:  Portable exam 2115 hr compared to 05/27/2013  FINDINGS: Acetabular and femoral components of a right hip prosthesis are again identified.  Oblique displaced proximal right femoral diaphyseal fracture seen on previous exam has been reduced.  A lateral plate, multiple screws and multiple cerclage wires have been placed at the right femur.  Bones appear demineralized.  Degenerative changes left hip.  IMPRESSION: Post ORIF right femur.   Electronically Signed   By: Ulyses Southward M.D.   On: 05/28/2013 21:25   Dg Chest Portable 1  View  05/28/2013   CLINICAL DATA:  Preop evaluation.  EXAM: PORTABLE CHEST - 1 VIEW  COMPARISON:  04/09/2011  FINDINGS: Single view of the chest was obtained. There is a triangular-shaped density in the left lower chest that roughly measures 8 mm. This density is in the region of overlying ribs. Otherwise, the lungs are clear without  airspace disease or edema. Heart and mediastinum are within normal limits. The trachea is midline. Cortical thickening in the lateral left clavicle suggest a previous injury and there is stable widening of the left AC joint.  IMPRESSION: Indeterminate 8 mm density in the left lower chest. Findings could be associated with overlying shadows or ribs but cannot exclude a pulmonary nodule. This area can be more definitively evaluated with CT.  No evidence for active chest disease.  These results will be called to the ordering clinician or representative by the Radiologist Assistant, and communication documented in the PACS Dashboard.   Electronically Signed   By: Richarda Overlie M.D.   On: 05/28/2013 07:36    Scheduled Meds: . enoxaparin (LOVENOX) injection  40 mg Subcutaneous Q24H  . ferrous sulfate  325 mg Oral TID PC  . multivitamin with minerals  1 tablet Oral Daily  . pantoprazole  40 mg Oral Daily  . ranitidine  150 mg Oral BID  . senna-docusate  1 tablet Oral BID   Continuous Infusions: . lactated ringers    . sodium chloride 0.9 % 1,000 mL with potassium chloride 40 mEq infusion 75 mL/hr at 05/28/13 2258    Principal Problem:   Hip fracture    Time spent:    [facility]  [facility] Pager [phone]. If 7PM-7AM, please contact night-coverage at [email or web address], password [facility] 05/29/2013, 10:16 AM  LOS: 2 days

## 2013-05-29 NOTE — Evaluation (Signed)
Physical Therapy Evaluation Patient Details Name: Jaime Phillips MRN: [id number] DOB: 1960/10/19 Today's Date: 05/29/2013 Time: 7829-5621 PT Time Calculation (min): 37 min  PT Assessment / Plan / Recommendation History of Present Illness  pt admitted after fall, R periprosthetic femur fx. Had DATHA 9/9/14for R THA, anterior direct approach  Clinical Impression  Pt very motivated to help self with mobility. Pt tolerated transfer with several steps to recliner, NWB  On RLE. Pt will benefit from PT to address problems. Pt reports L hip is bad and gave out and caused her to fall. Recommend CIR consult.  PT Assessment  Patient needs continued PT services    Follow Up Recommendations  CIR    Does the patient have the potential to tolerate intense rehabilitation      Barriers to Discharge Decreased caregiver support      Equipment Recommendations  Wheelchair (measurements PT)    Recommendations for Other Services Rehab consult   Frequency 7X/week    Precautions / Restrictions Precautions Precautions: Fall Restrictions Weight Bearing Restrictions: Yes RLE Weight Bearing: Non weight bearing   Pertinent Vitals/Pain 8 R thigh when moving. Premedicated. Ice applied.      Mobility  Bed Mobility Bed Mobility: Supine to Sit Supine to Sit: 3: Mod assist Details for Bed Mobility Assistance: support of RLE to move to edge of bed. Transfers Transfers: Sit to Stand;Stand to Sit Sit to Stand: 1: +2 Total assist;From bed;From elevated surface;With upper extremity assist Sit to Stand: Patient Percentage: 60% Stand to Sit: With armrests;To chair/3-in-1;With upper extremity assist;3: Mod assist Details for Transfer Assistance: multimodal cues for sfaety, MWB on R Leg which pt was able to maintain.  Ambulation/Gait Ambulation/Gait Assistance: 1: +2 Total assist (for safety) Ambulation/Gait: Patient Percentage: 60% Ambulation Distance (Feet): 5 Feet Assistive device: Rolling Presas Gait  Pattern: Step-to pattern;Trunk flexed Gait velocity: decreased    Exercises     PT Diagnosis: Difficulty walking;Acute pain  PT Problem List: Decreased strength;Decreased range of motion;Decreased activity tolerance;Decreased mobility;Decreased knowledge of use of DME;Decreased safety awareness;Decreased knowledge of precautions;Pain PT Treatment Interventions: DME instruction;Gait training;Functional mobility training;Therapeutic activities;Therapeutic exercise;Wheelchair mobility training;Patient/family education     PT Goals(Current goals can be found in the care plan section) Acute Rehab PT Goals Patient Stated Goal: I want to walk again. PT Goal Formulation: With patient Time For Goal Achievement: 06/05/13 Potential to Achieve Goals: Good  Visit Information  Last PT Received On: 05/29/13 History of Present Illness: pt admitted after fall, R periprosthetic femur fx. Had DATHA 9/9/14for R THA, anterior direct approach       Prior Functioning  Home Living Family/patient expects to be discharged to:: Private residence Living Arrangements: Alone;Non-relatives/Friends Available Help at Discharge: Family;Available PRN/intermittently Type of Home: House Home Access: Stairs to enter [employer] of Steps: 2 Entrance Stairs-Rails: None Home Layout: One level Home Equipment: Bedside commode;Depolo - 2 wheels Prior Function Level of Independence: Needs assistance;Independent with assistive device(s) ADL's / Homemaking Assistance Needed: with  shower    Cognition  Cognition Arousal/Alertness: Awake/alert Overall Cognitive Status: Within Functional Limits for tasks assessed    Extremity/Trunk Assessment Upper Extremity Assessment Upper Extremity Assessment: Defer to OT evaluation Lower Extremity Assessment Lower Extremity Assessment: RLE deficits/detail RLE Deficits / Details: knee flexion 50 degrees, rwequired assistance to lift leg and lower to floor.   Balance     End of Session PT - End of Session Equipment Utilized During Treatment: Gait belt Activity Tolerance: Patient tolerated treatment well;Patient limited by pain Patient left:  in chair;with call bell/phone within reach Nurse Communication: Mobility status  GP     Rada Hay 05/29/2013, 11:40 AM Blanchard Kelch PT [phone]

## 2013-05-29 NOTE — Progress Notes (Signed)
Rehab Admissions Coordinator Note:  Patient was screened by Clois Dupes for appropriateness for an Inpatient Acute Rehab Consult.  At this time, we are recommending Inpatient Rehab consult.  Clois Dupes 05/29/2013, 1:58 PM  I can be reached at [phone].

## 2013-05-29 NOTE — Progress Notes (Signed)
Physical Therapy Treatment Patient Details Name: Jaime Phillips MRN: [id number] DOB: 1960-12-18 Today's Date: 05/29/2013 Time: 4098-1191 PT Time Calculation (min): 15 min  PT Assessment / Plan / Recommendation  History of Present Illness pt admitted after fall, R periprosthetic femur fx. Had DATHA 9/9/14for R THA, anterior direct approach   PT Comments   Pt continues with increased LLE pain . Tolerated transfers to bed with NWB status. Recommend CIR.  Follow Up Recommendations  CIR     Does the patient have the potential to tolerate intense rehabilitation     Barriers to Discharge Decreased caregiver support      Equipment Recommendations  Wheelchair (measurements PT)    Recommendations for Other Services Rehab consult  Frequency 7X/week   Progress towards PT Goals Progress towards PT goals: Progressing toward goals  Plan Current plan remains appropriate    Precautions / Restrictions Precautions Precautions: Fall Restrictions Weight Bearing Restrictions: Yes RLE Weight Bearing: Non weight bearing   Pertinent Vitals/Pain 6 L thigh    Mobility  Bed Mobility Bed Mobility: Sit to Supine Supine to Sit: 3: Mod assist Sit to Supine: 3: Mod assist Details for Bed Mobility Assistance: support of RLE to place LLE onto bed. Transfers Transfers: Sit to Stand;Stand to Sit Sit to Stand: From chair/3-in-1;3: Mod assist;With upper extremity assist;With armrests Sit to Stand: Patient Percentage: 60% Stand to Sit: To bed;With upper extremity assist;3: Mod assist Stand Pivot Transfers: 3: Mod assist Details for Transfer Assistance: multimodal cues for sfaety, NWB on R Leg which pt was able to maintain.  Ambulation/Gait Ambulation/Gait Assistance: 1: +2 Total assist (for safety) Ambulation/Gait: Patient Percentage: 60% Ambulation Distance (Feet): 5 Feet Assistive device: Rolling Demps Gait Pattern: Step-to pattern;Trunk flexed Gait velocity: decreased    Exercises     PT  Diagnosis: Difficulty walking;Acute pain  PT Problem List: Decreased strength;Decreased range of motion;Decreased activity tolerance;Decreased mobility;Decreased knowledge of use of DME;Decreased safety awareness;Decreased knowledge of precautions;Pain PT Treatment Interventions: DME instruction;Gait training;Functional mobility training;Therapeutic activities;Therapeutic exercise;Wheelchair mobility training;Patient/family education   PT Goals (current goals can now be found in the care plan section) Acute Rehab PT Goals Patient Stated Goal: I want to walk again. PT Goal Formulation: With patient Time For Goal Achievement: 06/05/13 Potential to Achieve Goals: Good Additional Goals Additional Goal #1: propel WC x 50 ' with supervision.  Visit Information  Last PT Received On: 05/29/13 Assistance Needed: +1 History of Present Illness: pt admitted after fall, R periprosthetic femur fx. Had DATHA 9/9/14for R THA, anterior direct approach    Subjective Data  Patient Stated Goal: I want to walk again.   Cognition  Cognition Arousal/Alertness: Awake/alert Behavior During Therapy: WFL for tasks assessed/performed Overall Cognitive Status: Within Functional Limits for tasks assessed    Balance  Balance Balance Assessed: Yes Dynamic Standing Balance Dynamic Standing - Level of Assistance: 1: +2 Total assist (for safety to stand and wash periareas)  End of Session PT - End of Session Equipment Utilized During Treatment: Gait belt Activity Tolerance: Patient limited by pain Patient left: in bed;with call bell/phone within reach Nurse Communication: Mobility status   GP     Rada Hay 05/29/2013, 2:12 PM

## 2013-05-30 LAB — BASIC METABOLIC PANEL
BUN: 6 mg/dL (ref 6–23)
Calcium: 8.8 mg/dL (ref 8.4–10.5)
GFR calc Af Amer: >90 mL/min (ref >90)
GFR calc non Af Amer: >90 mL/min (ref >90)
Potassium: 3.7 mEq/L (ref 3.5–5.1)
Sodium: 137 mEq/L (ref 135–145)

## 2013-05-30 LAB — CBC
HCT: 20.1 % — ABNORMAL LOW (ref 36.0–46.0)
Hemoglobin: 6.7 g/dL — CL (ref 12.0–15.0)
MCH: 34.9 pg — ABNORMAL HIGH (ref 26.0–34.0)
MCHC: 33.3 g/dL (ref 30.0–36.0)
RDW: 13.5 % (ref 11.5–15.5)
WBC: 7.2 10*3/uL (ref 4.0–10.5)

## 2013-05-30 LAB — PREPARE RBC (CROSSMATCH)

## 2013-05-30 MED ORDER — DIPHENHYDRAMINE HCL 25 MG PO CAPS
25.0000 mg | ORAL_CAPSULE | Freq: Once | ORAL | Status: AC
  Administered 2013-05-30: 06:00:00 25 mg via ORAL
  Filled 2013-05-30: qty 1

## 2013-05-30 NOTE — Progress Notes (Signed)
   Subjective: 2 Days Post-Op Procedure(s) (LRB): OPEN REDUCTION INTERNAL FIXATION (ORIF) PERIPROSTHETIC FRACTURE (Right)   Patient reports pain as mild, pain well controlled. H&H dropped and patient states that she feels weak and tired. No other events throughout the night.  Objective:   VITALS:   Filed Vitals:   05/30/13 1020  BP: 108/71  Pulse: 88  Temp: 98.4 F (36.9 C)  Resp: 18    Neurovascular intact Dorsiflexion/Plantar flexion intact Incision: dressing C/D/I No cellulitis present Compartment soft  LABS  Recent Labs  05/28/13 0500 05/29/13 0420 05/30/13 0443  HGB 8.5* 7.4* 6.7*  HCT 25.5* 22.0* 20.1*  WBC 8.1 7.5 7.2  PLT 221 247 253     Recent Labs  05/28/13 0500 05/29/13 0420 05/30/13 0443  NA 134* 134* 137  K 2.9* 4.7 3.7  BUN 8 4* 6  CREATININE 0.51 0.47* 0.54  GLUCOSE 101* 143* 109*     Assessment/Plan: 2 Days Post-Op Procedure(s) (LRB): OPEN REDUCTION INTERNAL FIXATION (ORIF) PERIPROSTHETIC FRACTURE (Right)  Advance diet Up with therapy D/C IV fluids Discharge to SNF eventually, when ready  ABLA  Treated with 2 units of blood and will observe     Anastasio Auerbach. Leiloni Smithers   PAC  05/30/2013, 11:08 AM

## 2013-05-30 NOTE — Progress Notes (Signed)
CSW met with pt to assist with d/c planning. Pt is interested in CIR but will accept [facility] in [place] if CIR is unable to assist. CIR contacted and pt will be seen today by MD. CSW will continue to follow to assist with d/c planning as needed.  Cori Razor LCSW [phone]

## 2013-05-30 NOTE — Significant Event (Signed)
D: 51yo AAF HospDay#2 s/p fall with Rt periprosthetic fracture and POD#1 ORIF Rt periprosthetic fracture. 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  Critical lab value H/H 6.7/20.1 notification received at 0528. A: On Call paged at [phone].  Received call back promptly and informed of critical labs.   R: Orders given to transfuse 2 units PRBC and premedicate with 25mg  Benadryl po once.  Will inform patient of plan to transfuse and begin as soon as units are available from [facility].  Marrion Coy RN

## 2013-05-30 NOTE — H&P (Signed)
Physical Medicine and Rehabilitation Admission H&P    Chief Complaint  Patient presents with  . Hip Pain  :  Chief complaint: Right hip pain  HPI: Jaime Phillips is a 52 y.o. right-handed female with history of right total hip replacement anterior direct approach secondary to osteoarthritis 04/10/2013 and discharged to home 04/13/2013 with home therapies. Admitted 05/27/2013 after a fall landing on the right hip while walking through the kitchen. X-ray and imaging revealed right periprosthetic proximal femur fracture with stable prosthesis. Underwent ORIF 05/28/2013 per Dr. Olin. Advised nonweightbearing right lower extremity. Placed on subcutaneous Lovenox for DVT prophylaxis. Acute blood loss anemia 6.7 and transfused 05/30/2013 with follow up hemoglobin 10.3. Physical and occupational therapy evaluations completed 05/29/2013 with recommendations for physical medicine rehabilitation consult to consider inpatient rehabilitation services. Patient was felt to be a good candidate for inpatient rehabilitation services and was admitted for comprehensive rehabilitation program  Patient feels much better after transfusion. Still has right lower extremity pain He has a history of left shoulder surgery as well as left wrist fracture last year  ROS Review of Systems  Gastrointestinal:  GERD  Musculoskeletal: Positive for joint pain and myalgias.  Psychiatric/Behavioral: Positive for depression.  All other systems reviewed and are negative  Past Medical History  Diagnosis Date  . Abnormal uterine bleeding   . Depression   . Arthritis   . Rectal bleeding     minor    Past Surgical History  Procedure Laterality Date  . Appendectomy    . Oophorectomy    . Shoulder surgery      left shoulder  . Colonoscopy    . Total hip arthroplasty Right 04/10/2013    Procedure: RIGHT TOTAL HIP ARTHROPLASTY ANTERIOR APPROACH;  Surgeon: Matthew D Olin, MD;  Location: [facility];  Service: Orthopedics;   Laterality: Right;  . Orif periprosthetic fracture Right 05/28/2013    Procedure: OPEN REDUCTION INTERNAL FIXATION (ORIF) PERIPROSTHETIC FRACTURE;  Surgeon: Matthew D Olin, MD;  Location: [facility];  Service: Orthopedics;  Laterality: Right;   Family History  Problem Relation Age of Onset  . Diabetes Mother   . Kidney disease Mother   . Heart disease Father   . Colon cancer Father     "father may have had colon cancer"  . Cancer Sister     breast   Social History:  reports that she has been smoking Cigarettes.  She has a 12.5 pack-year smoking history. She has never used smokeless tobacco. She reports that she drinks about 1.8 ounces of alcohol per week. She reports that she does not use illicit drugs. Allergies:  Allergies  Allergen Reactions  . Codeine Other (See Comments)    hallucinating  . Morphine And Related     Made head feel funny   Medications Prior to Admission  Medication Sig Dispense Refill  . acetaminophen (TYLENOL) 500 MG tablet Take 500 mg by mouth every 6 (six) hours as needed (pain). For pain.      . Coenzyme Q10 (CO Q 10 PO) Take by mouth daily.      . ferrous sulfate 325 (65 FE) MG tablet Take 1 tablet (325 mg total) by mouth 3 (three) times daily after meals.    3  . methocarbamol (ROBAXIN) 500 MG tablet Take 1 tablet (500 mg total) by mouth every 6 (six) hours as needed (muscle spasms).  50 tablet  0  . Multiple Vitamin (MULTIVITAMIN WITH MINERALS) TABS tablet Take 1 tablet by mouth daily.      .   oxyCODONE (OXY IR/ROXICODONE) 5 MG immediate release tablet Take 1-2 tablets (5-10 mg total) by mouth every 4 (four) hours as needed for pain.  60 tablet  0  . ranitidine (ZANTAC) 150 MG tablet Take 1 tablet (150 mg total) by mouth 2 (two) times daily.      . vitamin E 400 UNIT capsule Take 400 Units by mouth daily.        Home: Home Living Family/patient expects to be discharged to:: Private residence Living Arrangements: Alone;Non-relatives/Friends Available Help  at Discharge: Family;Available PRN/intermittently Type of Home: House Home Access: Stairs to enter Entrance Stairs-Number of Steps: 2 Entrance Stairs-Rails: None Home Layout: One level Home Equipment: Bedside commode;[place] - 2 wheels;Adaptive equipment Adaptive Equipment: Reacher;Sock aid;Long-handled shoe horn;Long-handled sponge   Functional History:    Functional Status:  Mobility: Bed Mobility Bed Mobility: Sit to Supine Supine to Sit: 3: Mod assist Sit to Supine: 3: Mod assist Transfers Transfers: Sit to Stand;Stand to Sit Sit to Stand: From chair/3-in-1;3: Mod assist;With upper extremity assist;With armrests Sit to Stand: Patient Percentage: 60% Stand to Sit: To bed;With upper extremity assist;3: Mod assist Stand Pivot Transfers: 3: Mod assist Ambulation/Gait Ambulation/Gait Assistance: 1: +2 Total assist (for safety) Ambulation/Gait: Patient Percentage: 60% Ambulation Distance (Feet): 5 Feet Assistive device: Rolling Rann Gait Pattern: Step-to pattern;Trunk flexed Gait velocity: decreased    ADL: ADL Eating/Feeding: Simulated;Independent Where Assessed - Eating/Feeding: Bed level Grooming: Performed;Wash/dry face;Brushing hair;Set up Where Assessed - Grooming: Unsupported sitting Upper Body Bathing: Performed;Chest;Right arm;Left arm;Abdomen;Set up;Supervision/safety Where Assessed - Upper Body Bathing: Unsupported sitting Lower Body Bathing: +2 Total assistance Where Assessed - Lower Body Bathing: Supported sit to stand Upper Body Dressing: Performed;Supervision/safety;Set up Where Assessed - Upper Body Dressing: Unsupported sitting Lower Body Dressing: Simulated;+2 Total assistance (without AE) Where Assessed - Lower Body Dressing: Supported sit to stand Toilet Transfer: Simulated;+2 Total assistance Toilet Transfer Method: Stand pivot Equipment Used: Rolling Stelzner;Gait belt ADL Comments: Pt sat with R LE supported on bed and L LE over EOB to wash UB and  groom. Stood at EOB with therapists assist to wash periareas and did well with NWB on R LE. Pivot around to chair to sit up. Pt with some fatigue after grooming, bathing and functional transfer. Pt has used AE PTA so she is familiar with use. Pt is motivated to do for herself and wanted to do as much as she could this session.   Cognition: Cognition Overall Cognitive Status: Within Functional Limits for tasks assessed Orientation Level: Oriented X4 Cognition Arousal/Alertness: Awake/alert Behavior During Therapy: WFL for tasks assessed/performed Overall Cognitive Status: Within Functional Limits for tasks assessed  Physical Exam: Blood pressure 118/78, pulse 86, temperature 97.8 F (36.6 C), temperature source Oral, resp. rate 18, height 5' 3" (1.6 m), weight 61.236 kg (135 lb), SpO2 99.00%. Physical Exam Constitutional: She is oriented to person, place, and time. She appears well-developed.  HENT:  Head: Normocephalic.  Eyes: EOM are normal.  Neck: Normal range of motion. Neck supple. No thyromegaly present.  Cardiovascular: Normal rate and regular rhythm.  Respiratory: Effort normal and breath sounds normal. No respiratory distress.  GI: Soft. Bowel sounds are normal. She exhibits no distension.  Musculoskeletal:  Right hip incision dressed, small amount of fresh blood along the staple line. Initial incision well healed. RLE with 2+ edema. Appropriately tender. Able to move left hip,LE without substantial pain  . Ecchymosis along the right medial thigh  Neurological: She is alert and oriented to person, place, and time.    Patient follows full commands. She is a bit anxious during exam. Limbs NVI  Skin:  Hip incision is dressed and appropriately tender  Psychiatric: She has a normal mood and affect. Her behavior is normal. Judgment and thought content normal Motor strength is 5/5 in bilateral bicep, tricep, right grip, 4 minus left deltoid and left grip related to pain, 5/5 right  deltoid Results for orders placed during the hospital encounter of 05/27/13 (from the past 48 hour(s))  CBC     Status: Abnormal   Collection Time    05/29/13  4:20 AM      Result Value Range   WBC 7.5  4.0 - 10.5 K/uL   RBC 2.14 (*) 3.87 - 5.11 MIL/uL   Hemoglobin 7.4 (*) 12.0 - 15.0 g/dL   HCT 22.0 (*) 36.0 - 46.0 %   MCV 102.8 (*) 78.0 - 100.0 fL   MCH 34.6 (*) 26.0 - 34.0 pg   MCHC 33.6  30.0 - 36.0 g/dL   RDW 13.4  11.5 - 15.5 %   Platelets 247  150 - 400 K/uL  BASIC METABOLIC PANEL     Status: Abnormal   Collection Time    05/29/13  4:20 AM      Result Value Range   Sodium 134 (*) 135 - 145 mEq/L   Potassium 4.7  3.5 - 5.1 mEq/L   Comment: NO VISIBLE HEMOLYSIS     DELTA CHECK NOTED   Chloride 102  96 - 112 mEq/L   CO2 23  19 - 32 mEq/L   Glucose, Bld 143 (*) 70 - 99 mg/dL   BUN 4 (*) 6 - 23 mg/dL   Creatinine, Ser 0.47 (*) 0.50 - 1.10 mg/dL   Calcium 8.9  8.4 - 10.5 mg/dL   GFR calc non Af Amer >90  >90 mL/min   GFR calc Af Amer >90  >90 mL/min   Comment: (NOTE)     The eGFR has been calculated using the CKD EPI equation.     This calculation has not been validated in all clinical situations.     eGFR's persistently <90 mL/min signify possible Chronic Kidney     Disease.  RAPID HIV SCREEN ([facility])     Status: None   Collection Time    05/29/13 10:50 AM      Result Value Range   SUDS Rapid HIV Screen NON REACTIVE  NON REACTIVE   Comment: RESULT CALLED TO, READ BACK BY AND VERIFIED WITH:     S. RODRIGUEZ RN AT 1145 ON 10.28.14 BY SHUEA  CBC     Status: Abnormal   Collection Time    05/30/13  4:43 AM      Result Value Range   WBC 7.2  4.0 - 10.5 K/uL   RBC 1.92 (*) 3.87 - 5.11 MIL/uL   Hemoglobin 6.7 (*) 12.0 - 15.0 g/dL   Comment: REPEATED TO VERIFY     CRITICAL RESULT CALLED TO, READ BACK BY AND VERIFIED WITH:     CMERRITT RN AT 0525 ON [id number] BY DLONG   HCT 20.1 (*) 36.0 - 46.0 %   MCV 104.7 (*) 78.0 - 100.0 fL   MCH 34.9 (*) 26.0 - 34.0 pg   MCHC 33.3   30.0 - 36.0 g/dL   RDW 13.5  11.5 - 15.5 %   Platelets 253  150 - 400 K/uL  BASIC METABOLIC PANEL     Status: Abnormal   Collection Time    05/30/13  4:43   AM      Result Value Range   Sodium 137  135 - 145 mEq/L   Potassium 3.7  3.5 - 5.1 mEq/L   Comment: DELTA CHECK NOTED     REPEATED TO VERIFY   Chloride 105  96 - 112 mEq/L   CO2 26  19 - 32 mEq/L   Glucose, Bld 109 (*) 70 - 99 mg/dL   BUN 6  6 - 23 mg/dL   Creatinine, Ser 0.54  0.50 - 1.10 mg/dL   Calcium 8.8  8.4 - 10.5 mg/dL   GFR calc non Af Amer >90  >90 mL/min   GFR calc Af Amer >90  >90 mL/min   Comment: (NOTE)     The eGFR has been calculated using the CKD EPI equation.     This calculation has not been validated in all clinical situations.     eGFR's persistently <90 mL/min signify possible Chronic Kidney     Disease.  PREPARE RBC (CROSSMATCH)     Status: None   Collection Time    05/30/13  5:41 AM      Result Value Range   Order Confirmation ORDER PROCESSED BY BLOOD BANK     Dg Chest 2 View  05/29/2013   CLINICAL DATA:  Left chest pain under breast  EXAM: CHEST  2 VIEW  COMPARISON:  Prior chest x-ray 05/28/2013  FINDINGS: The lungs are clear and negative for focal airspace consolidation, pulmonary edema or suspicious pulmonary nodule. The previously identified nonspecific nodular opacity in the left lung base is not well seen on the present examination and is therefore favored to have reflected the costochondral junction of the left 6th rib. No pleural effusion or pneumothorax. Cardiac and mediastinal contours are within normal limits. No acute fracture or lytic or blastic osseous lesions. Residual degenerative changes at the left coracoclavicular and acromioclavicular joints. Query history of remote prior partial surgical resection versus AC joint injury. The visualized upper abdominal bowel gas pattern is unremarkable.  IMPRESSION: No active cardiopulmonary disease.   Electronically Signed   By: Heath  McCullough M.D.    On: 05/29/2013 08:22   Dg Pelvis Portable  05/28/2013   CLINICAL DATA:  Post right femoral fracture  EXAM: PORTABLE PELVIS  COMPARISON:  Portable exam 2115 hr compared to 05/27/2013  FINDINGS: Acetabular and femoral components of a right hip prosthesis are again identified.  Oblique displaced proximal right femoral diaphyseal fracture seen on previous exam has been reduced.  A lateral plate, multiple screws and multiple cerclage wires have been placed at the right femur.  Bones appear demineralized.  Degenerative changes left hip.  IMPRESSION: Post ORIF right femur.   Electronically Signed   By: Mark  Boles M.D.   On: 05/28/2013 21:25    Post Admission Physician Evaluation: 1. Functional deficits secondary  to right periprosthetic femur fracture status post ORIF 05/28/2013. 2. Patient is admitted to receive collaborative, interdisciplinary care between the physiatrist, rehab nursing staff, and therapy team. 3. Patient's level of medical complexity and substantial therapy needs in context of that medical necessity cannot be provided at a lesser intensity of care such as a SNF. 4. Patient has experienced substantial functional loss from his/her baseline which was documented above under the "Functional History" and "Functional Status" headings.  Judging by the patient's diagnosis, physical exam, and functional history, the patient has potential for functional progress which will result in measurable gains while on inpatient rehab.  These gains will be of substantial and practical use   upon discharge  in facilitating mobility and self-care at the household level. 5. Physiatrist will provide 24 hour management of medical needs as well as oversight of the therapy plan/treatment and provide guidance as appropriate regarding the interaction of the two. 6. 24 hour rehab nursing will assist with bladder management, bowel management, safety, skin/wound care, disease management, medication administration, pain  management and patient education  and help integrate therapy concepts, techniques,education, etc. 7. PT will assess and treat for/with: pre gait, gait training, endurance , safety, equipment, neuromuscular re education.   Goals are:  supervision to modified independent . 8. OT will assess and treat for/with: ADLs, Cognitive perceptual skills, Neuromuscular re education, safety, endurance, equipment.   Goals are:  supervision to modified independent . 9. SLP will assess and treat for/with:  not applicable .  Goals are:  not applicable . 10. Case Management and Social Worker will assess and treat for psychological issues and discharge planning. 11. Team conference will be held weekly to assess progress toward goals and to determine barriers to discharge. 12. Patient will receive at least 3 hours of therapy per day at least 5 days per week. 13. ELOS:  1-2 weeks        14. Prognosis:  excellent   Medical Problem List and Plan: 1. Right periprosthetic hip fracture after fall status post ORIF 05/28/2013. Recent right total hip arthroplasty 04/10/2013 2. DVT Prophylaxis/Anticoagulation: Subcutaneous Lovenox. Monitor platelet counts and any signs of bleeding. Check vascular studies 3. Pain Management: Percocet and Robaxin as needed. Monitor with increased mobility 4. Mood/anxiety. is on Ativan 0.5 mg every 8 hours as needed. 5. Neuropsych: This patient is capable of making decisions on her own behalf. 6. Acute blood loss anemia. Patient has been transfused. Continue iron supplement. Followup CBC 7. Tobacco abuse. NicoDerm patch. Provide counseling 8.GERD.zantac  Andrew E. Kirsteins M.D.  Physical Med and Rehab FAAPM&R (Sports Med, Neuromuscular Med) Diplomate Am Board of Electrodiagnostic Med Diplomate Am Board of Pain Medicine Fellow Am Board of Interventional Pain Physicians 05/30/2013 

## 2013-05-30 NOTE — Progress Notes (Signed)
Rehab admissions - Evaluated for possible admission.  Please see rehab consult done by Dr. Riley Kill.  Patient deemed appropriate for acute inpatient rehab admission.  I will see patient in am and plan to admit to acute inpatient rehab tomorrow pending medical stability.  Call me for questions.  #[phone]

## 2013-05-30 NOTE — Anesthesia Postprocedure Evaluation (Signed)
  Anesthesia Post-op Note  Patient: Jaime Phillips  Procedure(s) Performed: Procedure(s) (LRB): OPEN REDUCTION INTERNAL FIXATION (ORIF) PERIPROSTHETIC FRACTURE (Right)  Patient Location: PACU  Anesthesia Type: General  Level of Consciousness: awake and alert   Airway and Oxygen Therapy: Patient Spontanous Breathing  Post-op Pain: mild  Post-op Assessment: Post-op Vital signs reviewed, Patient's Cardiovascular Status Stable, Respiratory Function Stable, Patent Airway and No signs of Nausea or vomiting  Last Vitals:  Filed Vitals:   05/30/13 1715  BP: 130/85  Pulse: 86  Temp: 37.3 C  Resp: 16    Post-op Vital Signs: stable   Complications: No apparent anesthesia complications

## 2013-05-30 NOTE — Progress Notes (Signed)
Physical Therapy Treatment Patient Details Name: Jaime Phillips MRN: [id number] DOB: 04-18-1961 Today's Date: 05/30/2013 Time: 4401-0272 PT Time Calculation (min): 36 min  PT Assessment / Plan / Recommendation  History of Present Illness pt admitted after fall, R periprosthetic femur fx. Had DATHA 9/9/14for R THA, anterior direct approach   PT Comments   Pt improving in mobility. KI utilized for comfort in moving, protecting Knee. Pt will benefit from CIR.  Follow Up Recommendations  CIR     Does the patient have the potential to tolerate intense rehabilitation     Barriers to Discharge        Equipment Recommendations       Recommendations for Other Services Rehab consult  Frequency 7X/week   Progress towards PT Goals Progress towards PT goals: Progressing toward goals  Plan Current plan remains appropriate    Precautions / Restrictions Precautions Precautions: Fall Required Braces or Orthoses: Knee Immobilizer - Right (no orders but is more comfortable.) Restrictions RLE Weight Bearing: Non weight bearing   Pertinent Vitals/Pain R thigh 7 when lifting leg.    Mobility  Bed Mobility Supine to Sit: 3: Mod assist Details for Bed Mobility Assistance: support of RLE to place LLE onto bed. Transfers Sit to Stand: With upper extremity assist;4: Min assist;From toilet;From bed Stand to Sit: With upper extremity assist;3: Mod assist;To chair/3-in-1;To toilet Details for Transfer Assistance: multimodal cues for sfaety, NWB on R Leg which pt was able to maintain.  Ambulation/Gait Ambulation/Gait Assistance: 4: Min assist Ambulation Distance (Feet): 20 Feet Assistive device: Rolling [place] Ambulation/Gait Assistance Details: frequent cues to remain NWB. Gait velocity: decreased    Exercises     PT Diagnosis:    PT Problem List:   PT Treatment Interventions:     PT Goals (current goals can now be found in the care plan section)    Visit Information  Last PT  Received On: 05/30/13 Assistance Needed: +1 History of Present Illness: pt admitted after fall, R periprosthetic femur fx. Had DATHA 9/9/14for R THA, anterior direct approach    Subjective Data      Cognition  Cognition Arousal/Alertness: Awake/alert    Balance     End of Session PT - End of Session Activity Tolerance: Patient tolerated treatment well Patient left: in chair;with call bell/phone within reach;with family/visitor present Nurse Communication: Mobility status   GP     Jaime Phillips 05/30/2013, 2:57 PM

## 2013-05-30 NOTE — Progress Notes (Signed)
[facility] PROGRESS NOTE  Jaime Phillips UJW:[id number] DOB: [birth year] DOA: 05/27/2013 PCP: No primary provider on file.  Assessment/Plan: 1. Right hip fracture -s/p 10.28 ORIF R Peri-prosthetic stabilizer -start DVT ASA325 proph post-op -pain control Ortho  2. Macrocytic anemia, ? Anemia of chronic disease -Anemia of blood loss -transfused 2 U PRBC 10/29 -FU anemia panel  3. ?possible  8mm LLL density vs Rib shadow  -2 view CXR 10/28 was neg for infectious process  4.  Tobacco abuse  -counseled  5. Hypokalemia -replace  DVT proph   Code Status: Full Family Communication: none at bedside Disposition Plan: will likely need SNF   Consultants:  Ortho dr.Olin  HPI/Subjective: Some pain at the fracture site, otherwise feels well Not clear where she wants to go after Hospital stay.  Objective: Filed Vitals:   05/30/13 1715  BP: 130/85  Pulse: 86  Temp: 99.2 F (37.3 C)  Resp: 16    Intake/Output Summary (Last 24 hours) at 05/30/13 1808 Last data filed at 05/30/13 1445  Gross per 24 hour  Intake   1790 ml  Output   1050 ml  Net    740 ml   Filed Weights   05/27/13 1934  Weight: 61.236 kg (135 lb)    Exam:   General: AAOx3, no distress  Cardiovascular: S1s2/RRR  Respiratory: CTAB  Abdomen: soft, NT, BS present  Musculoskeletal: RLE ext rotated, distal pulses intact  Data Reviewed: Basic Metabolic Panel:  Recent Labs Lab 05/27/13 2120 05/28/13 0500 05/29/13 0420 05/30/13 0443  NA 136 134* 134* 137  K 2.9* 2.9* 4.7 3.7  CL 99 98 102 105  CO2 24 26 23 26   GLUCOSE 101* 101* 143* 109*  BUN 10 8 4* 6  CREATININE 0.48* 0.51 0.47* 0.54  CALCIUM 9.4 9.1 8.9 8.8   Liver Function Tests:  Recent Labs Lab 05/27/13 2120  AST 34  ALT 25  ALKPHOS 67  BILITOT 0.6  PROT 6.9  ALBUMIN 3.8   No results found for this basename: LIPASE, AMYLASE,  in the last 168 hours No results found for this basename: AMMONIA,  in the last 168  hours CBC:  Recent Labs Lab 05/27/13 2120 05/28/13 0500 05/29/13 0420 05/30/13 0443  WBC 9.9 8.1 7.5 7.2  NEUTROABS 6.3  --   --   --   HGB 9.6* 8.5* 7.4* 6.7*  HCT 28.6* 25.5* 22.0* 20.1*  MCV 102.5* 102.4* 102.8* 104.7*  PLT 283 221 247 253   Cardiac Enzymes:  Recent Labs Lab 05/27/13 2120  TROPONINI <0.30   BNP (last 3 results) No results found for this basename: PROBNP,  in the last 8760 hours CBG: No results found for this basename: GLUCAP,  in the last 168 hours  Recent Results (from the past 240 hour(s))  URINE CULTURE     Status: None   Collection Time    05/28/13  1:16 AM      Result Value Range Status   Specimen Description URINE, CLEAN CATCH   Final   Special Requests NONE   Final   Culture  Setup Time     Final   Value: 05/28/2013 11:05     Performed at [facility] Count     Final   Value: NO GROWTH     Performed at [facility]   Culture     Final   Value: NO GROWTH     Performed at [facility]   Report Status 05/29/2013  FINAL   Final  SURGICAL PCR SCREEN     Status: None   Collection Time    05/28/13  7:39 AM      Result Value Range Status   MRSA, PCR NEGATIVE  NEGATIVE Final   Staphylococcus aureus NEGATIVE  NEGATIVE Final   Comment:            The Xpert SA Assay (FDA     approved for NASAL specimens     in patients over 10 years of age),     is one component of     a comprehensive surveillance     program.  Test performance has     been validated by [employer] for patients greater     than or equal to 80 year old.     It is not intended     to diagnose infection nor to     guide or monitor treatment.     Studies: Dg Chest 2 View  05/29/2013   CLINICAL DATA:  Left chest pain under breast  EXAM: CHEST  2 VIEW  COMPARISON:  Prior chest x-ray 05/28/2013  FINDINGS: The lungs are clear and negative for focal airspace consolidation, pulmonary edema or suspicious pulmonary nodule. The previously  identified nonspecific nodular opacity in the left lung base is not well seen on the present examination and is therefore favored to have reflected the costochondral junction of the left 6th rib. No pleural effusion or pneumothorax. Cardiac and mediastinal contours are within normal limits. No acute fracture or lytic or blastic osseous lesions. Residual degenerative changes at the left coracoclavicular and acromioclavicular joints. Query history of remote prior partial surgical resection versus AC joint injury. The visualized upper abdominal bowel gas pattern is unremarkable.  IMPRESSION: No active cardiopulmonary disease.   Electronically Signed   By: Malachy Moan M.D.   On: 05/29/2013 08:22   Dg Pelvis Portable  05/28/2013   CLINICAL DATA:  Post right femoral fracture  EXAM: PORTABLE PELVIS  COMPARISON:  Portable exam 2115 hr compared to 05/27/2013  FINDINGS: Acetabular and femoral components of a right hip prosthesis are again identified.  Oblique displaced proximal right femoral diaphyseal fracture seen on previous exam has been reduced.  A lateral plate, multiple screws and multiple cerclage wires have been placed at the right femur.  Bones appear demineralized.  Degenerative changes left hip.  IMPRESSION: Post ORIF right femur.   Electronically Signed   By: Ulyses Southward M.D.   On: 05/28/2013 21:25    Scheduled Meds: . enoxaparin (LOVENOX) injection  40 mg Subcutaneous Q24H  . ferrous sulfate  325 mg Oral TID PC  . multivitamin with minerals  1 tablet Oral Daily  . nicotine  14 mg Transdermal Daily  . pantoprazole  40 mg Oral Daily  . ranitidine  150 mg Oral BID  . senna-docusate  1 tablet Oral BID   Continuous Infusions: . sodium chloride 0.9 % 1,000 mL with potassium chloride 40 mEq infusion 75 mL/hr at 05/28/13 2258    Principal Problem:   Hip fracture Active Problems:   Acute blood loss anemia    Time spent:    Rhetta Mura  [facility] Pager  [phone]. If 7PM-7AM, please contact night-coverage at [email or web address], password [facility] 05/30/2013, 6:08 PM  LOS: 3 days

## 2013-05-31 ENCOUNTER — Inpatient Hospital Stay (HOSPITAL_COMMUNITY)
Admission: RE | Admit: 2013-05-31 | Discharge: 2013-06-07 | DRG: 945 | Disposition: A | Discharge status: Home Health | Payer: Medicaid Other | Source: Intra-hospital | Attending: Physical Medicine & Rehabilitation | Admitting: Physical Medicine & Rehabilitation

## 2013-05-31 DIAGNOSIS — Z841 Family history of disorders of kidney and ureter: Secondary | ICD-10-CM

## 2013-05-31 DIAGNOSIS — M7989 Other specified soft tissue disorders: Secondary | ICD-10-CM

## 2013-05-31 DIAGNOSIS — Z9660 Presence of unspecified orthopedic joint implant: Secondary | ICD-10-CM

## 2013-05-31 DIAGNOSIS — F17200 Nicotine dependence, unspecified, uncomplicated: Secondary | ICD-10-CM

## 2013-05-31 DIAGNOSIS — M1610 Unilateral primary osteoarthritis, unspecified hip: Secondary | ICD-10-CM

## 2013-05-31 DIAGNOSIS — Z803 Family history of malignant neoplasm of breast: Secondary | ICD-10-CM

## 2013-05-31 DIAGNOSIS — F329 Major depressive disorder, single episode, unspecified: Secondary | ICD-10-CM

## 2013-05-31 DIAGNOSIS — Y831 Surgical operation with implant of artificial internal device as the cause of abnormal reaction of the patient, or of later complication, without mention of misadventure at the time of the procedure: Secondary | ICD-10-CM

## 2013-05-31 DIAGNOSIS — T84049A Periprosthetic fracture around unspecified internal prosthetic joint, initial encounter: Secondary | ICD-10-CM

## 2013-05-31 DIAGNOSIS — Z602 Problems related to living alone: Secondary | ICD-10-CM

## 2013-05-31 DIAGNOSIS — Z800 Family history of malignant neoplasm of digestive organs: Secondary | ICD-10-CM

## 2013-05-31 DIAGNOSIS — Z888 Allergy status to other drugs, medicaments and biological substances status: Secondary | ICD-10-CM

## 2013-05-31 DIAGNOSIS — K219 Gastro-esophageal reflux disease without esophagitis: Secondary | ICD-10-CM

## 2013-05-31 DIAGNOSIS — D62 Acute posthemorrhagic anemia: Secondary | ICD-10-CM

## 2013-05-31 DIAGNOSIS — Z96649 Presence of unspecified artificial hip joint: Secondary | ICD-10-CM

## 2013-05-31 DIAGNOSIS — Z8249 Family history of ischemic heart disease and other diseases of the circulatory system: Secondary | ICD-10-CM

## 2013-05-31 DIAGNOSIS — M169 Osteoarthritis of hip, unspecified: Secondary | ICD-10-CM

## 2013-05-31 DIAGNOSIS — Z833 Family history of diabetes mellitus: Secondary | ICD-10-CM

## 2013-05-31 DIAGNOSIS — M978XXA Periprosthetic fracture around other internal prosthetic joint, initial encounter: Secondary | ICD-10-CM

## 2013-05-31 DIAGNOSIS — Z9089 Acquired absence of other organs: Secondary | ICD-10-CM

## 2013-05-31 DIAGNOSIS — Z5189 Encounter for other specified aftercare: Principal | ICD-10-CM

## 2013-05-31 DIAGNOSIS — F411 Generalized anxiety disorder: Secondary | ICD-10-CM

## 2013-05-31 DIAGNOSIS — F3289 Other specified depressive episodes: Secondary | ICD-10-CM

## 2013-05-31 DIAGNOSIS — Z79899 Other long term (current) drug therapy: Secondary | ICD-10-CM

## 2013-05-31 LAB — CBC
HCT: 30.7 % — ABNORMAL LOW (ref 36.0–46.0)
HCT: 31.8 % — ABNORMAL LOW (ref 36.0–46.0)
Hemoglobin: 10.3 g/dL — ABNORMAL LOW (ref 12.0–15.0)
MCH: 32 pg (ref 26.0–34.0)
MCH: 33.2 pg (ref 26.0–34.0)
MCHC: 34.6 g/dL (ref 30.0–36.0)
Platelets: 266 10*3/uL (ref 150–400)
RBC: 3.22 MIL/uL — ABNORMAL LOW (ref 3.87–5.11)
RDW: 17.9 % — ABNORMAL HIGH (ref 11.5–15.5)
RDW: 17.5 % — ABNORMAL HIGH (ref 11.5–15.5)
WBC: 7.8 10*3/uL (ref 4.0–10.5)

## 2013-05-31 LAB — BASIC METABOLIC PANEL
CO2: 25 mEq/L (ref 19–32)
Calcium: 9.1 mg/dL (ref 8.4–10.5)
Chloride: 102 mEq/L (ref 96–112)
Creatinine, Ser: 0.52 mg/dL (ref 0.50–1.10)
GFR calc Af Amer: >90 mL/min (ref >90)
Glucose, Bld: 98 mg/dL (ref 70–99)
Potassium: 3.8 mEq/L (ref 3.5–5.1)
Sodium: 136 mEq/L (ref 135–145)

## 2013-05-31 LAB — TYPE AND SCREEN
ABO/RH(D): A POS
Antibody Screen: NEGATIVE
Unit division: 0

## 2013-05-31 LAB — CREATININE, SERUM: GFR calc non Af Amer: >90 mL/min (ref >90)

## 2013-05-31 MED ORDER — ONDANSETRON HCL 4 MG/2ML IJ SOLN: 4.0000 mg | Freq: Four times a day (QID) | INTRAMUSCULAR | Status: DC | PRN

## 2013-05-31 MED ORDER — NICOTINE 14 MG/24HR TD PT24: 1.0000 | MEDICATED_PATCH | Freq: Every day | TRANSDERMAL | Status: DC

## 2013-05-31 MED ORDER — ENOXAPARIN SODIUM 40 MG/0.4ML ~~LOC~~ SOLN
40.0000 mg | SUBCUTANEOUS | Status: DC
  Administered 2013-06-01 – 2013-06-06 (×6): 40 mg via SUBCUTANEOUS
  Filled 2013-05-31 (×7): qty 0.4

## 2013-05-31 MED ORDER — METHOCARBAMOL 500 MG PO TABS: 500.0000 mg | ORAL_TABLET | Freq: Four times a day (QID) | ORAL | Status: DC | PRN

## 2013-05-31 MED ORDER — ACETAMINOPHEN 325 MG PO TABS
650.0000 mg | ORAL_TABLET | Freq: Four times a day (QID) | ORAL | Status: DC | PRN
  Administered 2013-06-02 – 2013-06-06 (×2): 650 mg via ORAL
  Filled 2013-05-31 (×2): qty 2

## 2013-05-31 MED ORDER — POLYETHYLENE GLYCOL 3350 17 G PO PACK: 17.0000 g | PACK | Freq: Every day | ORAL | Status: DC | PRN

## 2013-05-31 MED ORDER — OXYCODONE-ACETAMINOPHEN 5-325 MG PO TABS
1.0000 | ORAL_TABLET | ORAL | Status: DC | PRN
Start: 2013-05-31 — End: 2013-06-07
  Administered 2013-05-31 – 2013-06-01 (×7): 2 via ORAL
  Administered 2013-06-02: 1 via ORAL
  Administered 2013-06-02 – 2013-06-03 (×5): 2 via ORAL
  Administered 2013-06-03: 1 via ORAL
  Administered 2013-06-03 – 2013-06-05 (×8): 2 via ORAL
  Administered 2013-06-05: 1 via ORAL
  Administered 2013-06-05 – 2013-06-06 (×2): 2 via ORAL
  Administered 2013-06-06 (×2): 1 via ORAL
  Administered 2013-06-06: 2 via ORAL
  Administered 2013-06-07 (×2): 1 via ORAL
  Administered 2013-06-07: 2 via ORAL
  Filled 2013-05-31 (×3): qty 2
  Filled 2013-05-31: qty 1
  Filled 2013-05-31 (×10): qty 2
  Filled 2013-05-31 (×2): qty 1
  Filled 2013-05-31 (×8): qty 2
  Filled 2013-05-31: qty 1
  Filled 2013-05-31 (×7): qty 2

## 2013-05-31 MED ORDER — OXYCODONE-ACETAMINOPHEN 5-325 MG PO TABS: 1.0000 | ORAL_TABLET | ORAL | Status: DC | PRN

## 2013-05-31 MED ORDER — ADULT MULTIVITAMIN W/MINERALS CH
1.0000 | ORAL_TABLET | Freq: Every day | ORAL | Status: DC
  Administered 2013-06-01 – 2013-06-07 (×7): 1 via ORAL
  Filled 2013-05-31 (×8): qty 1

## 2013-05-31 MED ORDER — SENNOSIDES-DOCUSATE SODIUM 8.6-50 MG PO TABS
1.0000 | ORAL_TABLET | Freq: Two times a day (BID) | ORAL | Status: DC
  Administered 2013-05-31 – 2013-06-07 (×13): 1 via ORAL
  Filled 2013-05-31 (×18): qty 1

## 2013-05-31 MED ORDER — ONDANSETRON HCL 4 MG PO TABS: 4.0000 mg | ORAL_TABLET | Freq: Four times a day (QID) | ORAL | Status: DC | PRN

## 2013-05-31 MED ORDER — OXYCODONE HCL 5 MG PO TABS: 5.0000 mg | ORAL_TABLET | ORAL | Status: DC | PRN

## 2013-05-31 MED ORDER — ENOXAPARIN SODIUM 40 MG/0.4ML ~~LOC~~ SOLN: 40.0000 mg | SUBCUTANEOUS | Status: DC

## 2013-05-31 MED ORDER — POLYETHYLENE GLYCOL 3350 17 G PO PACK
17.0000 g | PACK | Freq: Every day | ORAL | Status: DC | PRN
  Administered 2013-06-04: 17 g via ORAL
  Filled 2013-05-31: qty 1

## 2013-05-31 MED ORDER — FERROUS SULFATE 325 (65 FE) MG PO TABS
325.0000 mg | ORAL_TABLET | Freq: Three times a day (TID) | ORAL | Status: DC
  Administered 2013-05-31 – 2013-06-07 (×20): 325 mg via ORAL
  Filled 2013-05-31 (×24): qty 1

## 2013-05-31 MED ORDER — SENNOSIDES-DOCUSATE SODIUM 8.6-50 MG PO TABS: 1.0000 | ORAL_TABLET | Freq: Two times a day (BID) | ORAL | Status: DC

## 2013-05-31 MED ORDER — BISACODYL 10 MG RE SUPP
10.0000 mg | Freq: Every day | RECTAL | Status: DC | PRN
  Administered 2013-06-01 (×2): 10 mg via RECTAL
  Filled 2013-05-31 (×2): qty 1

## 2013-05-31 MED ORDER — METHOCARBAMOL 500 MG PO TABS
500.0000 mg | ORAL_TABLET | Freq: Four times a day (QID) | ORAL | Status: DC | PRN
  Administered 2013-05-31 – 2013-06-07 (×15): 500 mg via ORAL
  Filled 2013-05-31 (×16): qty 1

## 2013-05-31 MED ORDER — PANTOPRAZOLE SODIUM 40 MG PO TBEC
40.0000 mg | DELAYED_RELEASE_TABLET | Freq: Every day | ORAL | Status: DC
  Administered 2013-06-01 – 2013-06-07 (×7): 40 mg via ORAL
  Filled 2013-05-31 (×9): qty 1

## 2013-05-31 MED ORDER — SORBITOL 70 % SOLN
30.0000 mL | Freq: Every day | Status: DC | PRN
  Administered 2013-06-01 – 2013-06-06 (×5): 30 mL via ORAL
  Filled 2013-05-31 (×5): qty 30

## 2013-05-31 MED ORDER — CALCIUM CARBONATE ANTACID 500 MG PO CHEW: 1.0000 | CHEWABLE_TABLET | Freq: Three times a day (TID) | ORAL | Status: DC

## 2013-05-31 MED ORDER — RANITIDINE HCL 150 MG/10ML PO SYRP
150.0000 mg | ORAL_SOLUTION | Freq: Two times a day (BID) | ORAL | Status: DC
  Administered 2013-05-31 – 2013-06-07 (×14): 150 mg via ORAL
  Filled 2013-05-31 (×16): qty 10

## 2013-05-31 MED ORDER — ENSURE COMPLETE PO LIQD: 237.0000 mL | Freq: Two times a day (BID) | ORAL | Status: DC | PRN

## 2013-05-31 MED ORDER — VITAMINS A & D EX OINT
TOPICAL_OINTMENT | CUTANEOUS | Status: AC
  Filled 2013-05-31: qty 5

## 2013-05-31 MED ORDER — NICOTINE 14 MG/24HR TD PT24
14.0000 mg | MEDICATED_PATCH | Freq: Every day | TRANSDERMAL | Status: DC
  Administered 2013-06-04: 14 mg via TRANSDERMAL
  Filled 2013-05-31 (×8): qty 1

## 2013-05-31 NOTE — Progress Notes (Signed)
*  Preliminary Results* Bilateral lower extremity venous duplex completed. Bilateral lower extremities are negative for deep vein thrombosis. There is no evidence of Baker's cyst bilaterally.  05/31/2013  Gertie Fey, RVT, RDCS, RDMS

## 2013-05-31 NOTE — Progress Notes (Addendum)
   Subjective: 3 Days Post-Op Procedure(s) (LRB): OPEN REDUCTION INTERNAL FIXATION (ORIF) PERIPROSTHETIC FRACTURE (Right)   Patient reports pain as mild, pain controlled well. No events throughout the night. Orthopaedically stable.  Objective:   VITALS:   Filed Vitals:   05/31/13 0623  BP: 122/78  Pulse: 88  Temp: 98.6 F (37 C)  Resp: 18    Neurovascular intact Dorsiflexion/Plantar flexion intact Incision: scant drainage No cellulitis present Compartment soft  LABS  Recent Labs  05/29/13 0420 05/30/13 0443 05/31/13 0458  HGB 7.4* 6.7* 10.3*  HCT 22.0* 20.1* 30.7*  WBC 7.5 7.2 7.8  PLT 247 253 266     Recent Labs  05/29/13 0420 05/30/13 0443 05/31/13 0458  NA 134* 137 136  K 4.7 3.7 3.8  BUN 4* 6 5*  CREATININE 0.47* 0.54 0.52  GLUCOSE 143* 109* 98     Assessment/Plan: 3 Days Post-Op Procedure(s) (LRB): OPEN REDUCTION INTERNAL FIXATION (ORIF) PERIPROSTHETIC FRACTURE (Right) Up with therapy Discharge to SNF / CIR eventually when ready Orthopaedically stable Lovenox for 2 weeks the ASA daily, Rx written Percocet for pain, Rx written Iron tid for ABLA MiraLax and colace as needed to prevent constipation Follow up in 2 weeks at [facility]. Follow up with OLIN,Carmon Brigandi D in 2 weeks.  Contact information:  [facility] [address], [address] [place] [address] [phone]       Anastasio Auerbach. Derel Mcglasson   PAC  05/31/2013, 7:57 AM

## 2013-05-31 NOTE — PMR Pre-admission (Signed)
PMR Admission Coordinator Pre-Admission Assessment  Patient: Jaime Phillips is an 52 y.o., female MRN: [id number] DOB: Dec 15, 1960 Height: 5\' 3"  (160 cm) Weight: 61.236 kg (135 lb)              Insurance Information Self pay  Medicaid Application Date:        Case Manager:   Disability Application Date:        Case Worker:    Emergency Conservator, museum/gallery Information   Name Relation Home Work Mobile   Davis,Arthur Other [phone]       Current Medical History  Patient Admitting Diagnosis: Right periprosthetic hip fx after fall. Recent right THA. OA of left hip  History of Present Illness:  A 52 y.o. right-handed female with history of right total hip replacement anterior direct approach secondary to osteoarthritis 04/10/2013 and discharged to home 04/13/2013 with home therapies. Admitted 05/27/2013 after a fall landing on the right hip while walking through the kitchen. X-ray and imaging revealed right periprosthetic proximal femur fracture with stable prosthesis. Underwent ORIF 05/28/2013 per Dr. Charlann Boxer. Advised nonweightbearing right lower extremity. Placed on subcutaneous Lovenox for DVT prophylaxis. Acute blood loss anemia 6.7 and transfused 05/30/2013 with follow up hemoglobin 10.3. Physical and occupational therapy evaluations completed 05/29/2013 with recommendations for physical medicine rehabilitation consult to consider inpatient rehabilitation services. Patient was felt to be a good candidate for inpatient rehabilitation services and will be admitted for comprehensive rehabilitation program.    Past Medical History  Past Medical History  Diagnosis Date  . Abnormal uterine bleeding   . Depression   . Arthritis   . Rectal bleeding     minor     Family History  family history includes Cancer in her sister; Colon cancer in her father; Diabetes in her mother; Heart disease in her father; Kidney disease in her mother.  Prior Rehab/Hospitalizations:  No inpatient  rehab admissions.   Current Medications  Current facility-administered medications:acetaminophen (TYLENOL) tablet 650 mg, 650 mg, Oral, Q6H PRN, Genelle Gather Babish, PA-C;  bisacodyl (DULCOLAX) suppository 10 mg, 10 mg, Rectal, Daily PRN, Genelle Gather Babish, PA-C, 10 mg at 05/31/13 8295;  enoxaparin (LOVENOX) injection 40 mg, 40 mg, Subcutaneous, Q24H, Zannie Cove, MD, 40 mg at 05/30/13 1230 feeding supplement (ENSURE COMPLETE) (ENSURE COMPLETE) liquid 237 mL, 237 mL, Oral, BID BM PRN, Lorraine Lax, RD;  ferrous sulfate tablet 325 mg, 325 mg, Oral, TID PC, Genelle Gather Babish, PA-C, 325 mg at 05/31/13 6213;  HYDROmorphone (DILAUDID) injection 0.5-2 mg, 0.5-2 mg, Intravenous, Q2H PRN, Genelle Gather Babish, PA-C, 1 mg at 05/29/13 1327 LORazepam (ATIVAN) tablet 0.5 mg, 0.5 mg, Oral, Q8H PRN, Ralene Bathe, PA-C, 0.5 mg at 05/30/13 2325;  menthol-cetylpyridinium (CEPACOL) lozenge 3 mg, 1 lozenge, Oral, PRN, Genelle Gather Babish, PA-C;  multivitamin with minerals tablet 1 tablet, 1 tablet, Oral, Daily, Lorraine Lax, RD, 1 tablet at 05/30/13 1020;  nicotine (NICODERM CQ - dosed in mg/24 hours) patch 14 mg, 14 mg, Transdermal, Daily, Zannie Cove, MD, 14 mg at 05/29/13 1712 ondansetron (ZOFRAN) injection 4 mg, 4 mg, Intravenous, Q6H PRN, Genelle Gather Babish, PA-C;  ondansetron [employer]) tablet 4 mg, 4 mg, Oral, Q6H PRN, Genelle Gather Babish, PA-C;  oxyCODONE-acetaminophen (PERCOCET/ROXICET) 5-325 MG per tablet 1-2 tablet, 1-2 tablet, Oral, Q4H PRN, Ralene Bathe, PA-C, 2 tablet at 05/31/13 0533;  pantoprazole (PROTONIX) EC tablet 40 mg, 40 mg, Oral, Daily, Zannie Cove, MD, 40 mg at 05/30/13 1020 phenol (CHLORASEPTIC) mouth spray 1 spray, 1 spray, Mouth/Throat, PRN,  Genelle Gather Babish, PA-C;  polyethylene glycol (MIRALAX / GLYCOLAX) packet 17 g, 17 g, Oral, Daily PRN, Genelle Gather Babish, PA-C;  ranitidine (ZANTAC) 150 MG/10ML syrup 150 mg, 150 mg, Oral, BID, Zannie Cove, MD, 150 mg at  05/30/13 2119;  senna-docusate (Senokot-S) tablet 1 tablet, 1 tablet, Oral, BID, Zannie Cove, MD, 1 tablet at 05/30/13 2120 sodium chloride 0.9 % 1,000 mL with potassium chloride 40 mEq infusion, , Intravenous, Continuous, Zannie Cove, MD, Last Rate: 75 mL/hr at 05/28/13 2258  Patients Current Diet: General  Precautions / Restrictions Precautions Precautions: Fall Restrictions Weight Bearing Restrictions: Yes RLE Weight Bearing: Non weight bearing   Prior Activity Level Limited Community (1-2x/wk): Went out 1-2 X a week (Has not worked since 02/14.)  Home Assistive Devices / Equipment Home Assistive Devices/Equipment: Gilmer Mor (specify quad or straight) Home Equipment: Bedside commode;Oran - 2 wheels;Adaptive equipment  Prior Functional Level Prior Function Level of Independence: Needs assistance;Independent with assistive device(s) ADL's / Homemaking Assistance Needed: with  shower, used AE to dress PTA  Current Functional Level Cognition  Overall Cognitive Status: Within Functional Limits for tasks assessed Orientation Level: Oriented X4    Extremity Assessment (includes Sensation/Coordination)          ADLs  Eating/Feeding: Simulated;Independent Where Assessed - Eating/Feeding: Bed level Grooming: Performed;Wash/dry face;Brushing hair;Set up Where Assessed - Grooming: Unsupported sitting Upper Body Bathing: Performed;Chest;Right arm;Left arm;Abdomen;Set up;Supervision/safety Where Assessed - Upper Body Bathing: Unsupported sitting Lower Body Bathing: +2 Total assistance Lower Body Bathing: Patient Percentage: 50% Where Assessed - Lower Body Bathing: Supported sit to stand Upper Body Dressing: Performed;Supervision/safety;Set up Where Assessed - Upper Body Dressing: Unsupported sitting Lower Body Dressing: Simulated;+2 Total assistance (without AE) Lower Body Dressing: Patient Percentage: 50% Where Assessed - Lower Body Dressing: Supported sit to stand Toilet  Transfer: Simulated;+2 Total assistance Toilet Transfer: Patient Percentage: 60% Statistician Method: Stand pivot Toileting - Architect and Hygiene: Simulated;+2 Total assistance Toileting - Architect and Hygiene: Patient Percentage: 60% Where Assessed - Glass blower/designer Manipulation and Hygiene: Sit to stand from 3-in-1 or toilet Equipment Used: Rolling Piet;Gait belt ADL Comments: Pt sat with R LE supported on bed and L LE over EOB to wash UB and groom. Stood at [facility] with therapists assist to wash periareas and did well with NWB on R LE. Pivot around to chair to sit up. Pt with some fatigue after grooming, bathing and functional transfer. Pt has used AE PTA so she is familiar with use. Pt is motivated to do for herself and wanted to do as much as she could this session.     Mobility  Bed Mobility: Sit to Supine Supine to Sit: 3: Mod assist Sit to Supine: 3: Mod assist    Transfers  Transfers: Sit to Stand;Stand to Sit Sit to Stand: With upper extremity assist;4: Min assist;From toilet;From bed Sit to Stand: Patient Percentage: 60% Stand to Sit: With upper extremity assist;3: Mod assist;To chair/3-in-1;To toilet Stand Pivot Transfers: 3: Mod assist    Ambulation / Gait / Stairs / Wheelchair Mobility  Ambulation/Gait Ambulation/Gait Assistance: 4: Min assist Ambulation/Gait: Patient Percentage: 60% Ambulation Distance (Feet): 20 Feet Assistive device: Rolling Rorrer Ambulation/Gait Assistance Details: frequent cues to remain NWB. Gait Pattern: Step-to pattern;Trunk flexed Gait velocity: decreased    Posture / Balance Dynamic Standing Balance Dynamic Standing - Level of Assistance: 1: +2 Total assist (for safety to stand and wash periareas)    Special needs/care consideration BiPAP/CPAP No CPM No Continuous Drip IV 0.9% NS  40 meq KCL 75 ml/hr Dialysis No         Life Vest No Oxygen No Special Bed No Trach Size No Wound Vac (area) No        Skin  Has itchy skin                               Bowel mgmt: BM 05/31/13 Bladder mgmt: Voiding up on BSC Diabetic mgmt No    Previous Home Environment Living Arrangements: Alone;Non-relatives/Friends Available Help at Discharge: Family;Available PRN/intermittently Type of Home: House Home Layout: One level Home Access: Stairs to enter Entrance Stairs-Rails: None Entrance Stairs-Number of Steps: 2 Bathroom Shower/Tub: Engineer, manufacturing systems: Standard Home Care Services: No  Discharge Living Setting Plans for Discharge Living Setting: House;Lives with (comment) (Lives with boyfriend.) Type of Home at Discharge: House Discharge Home Layout: One level Discharge Home Access: Stairs to enter Entrance Stairs-Number of Steps: 2 steps at entrance plus 1 step up from porch to house. Does the patient have any problems obtaining your medications?: No  Social/Family/Support Systems Patient Roles: Other (Comment) (Has a boyfriend.) Contact Information: Richardean Chimera - boyfriend Anticipated Caregiver: self and boyfriend Anticipated Caregiver's Contact Information: Darreld Mclean [phone] Ability/Limitations of Caregiver: Boyfriend is retired, works Systems developer, does Agricultural consultant work as well. Caregiver Availability: Intermittent Discharge Plan Discussed with Primary Caregiver: Yes Is Caregiver In Agreement with Plan?: Yes Does Caregiver/Family have Issues with Lodging/Transportation while Pt is in Rehab?: No  Goals/Additional Needs Patient/Family Goal for Rehab: PT/OT mod I goals, no ST needs Expected length of stay: 7-10 days Cultural Considerations: None Dietary Needs: Regular diet, thin liquids Equipment Needs: TBD Pt/Family Agrees to Admission and willing to participate: Yes Program Orientation Provided & Reviewed with Pt/Caregiver Including Roles  & Responsibilities: Yes   Decrease burden of Care through IP rehab admission:  N/A   Possible need for SNF placement upon discharge:  Not planned   Patient Condition: This patient's condition remains as documented in the consult dated 05/30/13, in which the Rehabilitation Physician determined and documented that the patient's condition is appropriate for intensive rehabilitative care in an inpatient rehabilitation facility. Will admit to inpatient rehab today.  Preadmission Screen Completed By:  Trish Mage, 05/31/2013 9:25 AM ______________________________________________________________________   Discussed status with Dr. Riley Kill on 05/31/13 at 0932 and received telephone approval for admission today.  Admission Coordinator:  Trish Mage, time0932/Date10/30/14

## 2013-05-31 NOTE — Discharge Summary (Signed)
Physician Discharge Summary  Jaime Phillips ZOX:[id number] DOB: 1960-12-10 DOA: 05/27/2013  PCP: No primary provider on file.  Admit date: 05/27/2013 Discharge date: 05/31/2013  Time spent: 35 minutes  Recommendations for Outpatient Follow-up:  1. Recommend vitamin D level checked in about one to 2 months 2. Recommend outpatient followup with orthopedic surgeon Dr. Charlann Boxer 3. Recommend complete smoking cessation 4. Please get blood work done in about one to 2 weeks-recommend outpatient workup for the anemia, colonoscopy,  Etc. if anemia persists beyond the next 1-2 months   Discharge Diagnoses:  Principal Problem:   Hip fracture Active Problems:   Acute blood loss anemia   Discharge Condition: Stable  Diet recommendation: Heart healthy  Filed Weights   05/27/13 1934  Weight: 61.236 kg (135 lb)    History of present illness:  52 year old female admitted 05/27/13 status post fall on 10/26. 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  It was noted that patient had an acute periprosthetic fracture  had ORIF on 10/28. Patient needed 2 units of packed red blood cells 10/29 perioperatively It was noted that patient had a macrocytic anemia and iron studies essentially were normal. Patient may need outpatient workup for the same 2 view chest x-ray 10/28 was negative for infectious process or other issues although there was initial concern of nodule. She was recommended to continue aspirin 325 perioperatively for about 1 month for DVT prophylaxis  Patient was recommended for physical therapy either at inpatient rehabilitation vs. skilled nursing and patient chose inpatient rehabilitation and was transitioned over there for further management Patient had some mild hypokalemia that was replaced Patient was prescribed pain medicines and muscle relaxants as per orthopedic service Patient was given nicotine patch to help with smoking Patient was also  recommended to start TUMS 3 times a day and to get vitamin D level in about 2 months Please see below instructions as per orthopedics  Consultations:  Orthopedics  Discharge Exam: Filed Vitals:   05/31/13 0623  BP: 122/78  Pulse: 88  Temp: 98.6 F (37 C)  Resp: 18   alert pleasant oriented no apparent distress Tolerating by mouth Still having about 7-8/10 pain, ambulatory to the bedside commode without  General: EOMI, NCAT Cardiovascular: S1-S2 no murmur rub or gallop Respiratory: Clinically clear  Discharge Instructions  Discharge Orders   Future Orders Complete By Expires   Call MD / Call 911  As directed    Comments:     If you experience chest pain or shortness of breath, CALL 911 and be transported to the hospital emergency room.  If you develope a fever above 101 F, pus (white drainage) or increased drainage or redness at the wound, or calf pain, call your surgeon's office.   Constipation Prevention  As directed    Comments:     Drink plenty of fluids.  Prune juice may be helpful.  You may use a stool softener, such as Colace (over the counter) 100 mg twice a day.  Use MiraLax (over the counter) for constipation as needed.   Diet - low sodium heart healthy  As directed    Diet - low sodium heart healthy  As directed    Discharge instructions  As directed    Comments:     Daily dressing changes with gauze and tape. Keep the area dry and clean until follow up. Follow up in 2 weeks at [facility]. Call with any questions or concerns.   Discharge instructions  As directed  Comments:     Follow up with orthopedics as scheduled Stop smoking Get a vit D level checked in about 2 months   Driving restrictions  As directed    Comments:     No driving for 4 weeks   Increase activity slowly  As directed    Non weight bearing  As directed    Questions:     Laterality:  right   Extremity:  Lower       Medication List    STOP taking these medications        oxyCODONE 5 MG immediate release tablet  Commonly known as:  Oxy IR/ROXICODONE      TAKE these medications       acetaminophen 500 MG tablet  Commonly known as:  TYLENOL  Take 500 mg by mouth every 6 (six) hours as needed (pain). For pain.     calcium carbonate 500 MG chewable tablet  Commonly known as:  TUMS  Chew 1 tablet (200 mg of elemental calcium total) by mouth 3 (three) times daily.     CO Q 10 PO  Take by mouth daily.     enoxaparin 40 MG/0.4ML injection  Commonly known as:  LOVENOX  Inject 0.4 mLs (40 mg total) into the skin daily.     ferrous sulfate 325 (65 FE) MG tablet  Take 1 tablet (325 mg total) by mouth 3 (three) times daily after meals.     methocarbamol 500 MG tablet  Commonly known as:  ROBAXIN  Take 1 tablet (500 mg total) by mouth every 6 (six) hours as needed (muscle spasms).     multivitamin with minerals Tabs tablet  Take 1 tablet by mouth daily.     nicotine 14 mg/24hr patch  Commonly known as:  NICODERM CQ - dosed in mg/24 hours  Place 1 patch onto the skin daily.     oxyCODONE-acetaminophen 5-325 MG per tablet  Commonly known as:  PERCOCET/ROXICET  Take 1-2 tablets by mouth every 4 (four) hours as needed for pain.     polyethylene glycol packet  Commonly known as:  MIRALAX / GLYCOLAX  Take 17 g by mouth daily as needed.     ranitidine 150 MG tablet  Commonly known as:  ZANTAC  Take 1 tablet (150 mg total) by mouth 2 (two) times daily.     senna-docusate 8.6-50 MG per tablet  Commonly known as:  Senokot-S  Take 1 tablet by mouth 2 (two) times daily.     vitamin E 400 UNIT capsule  Take 400 Units by mouth daily.       Allergies  Allergen Reactions  . Codeine Other (See Comments)    hallucinating  . Morphine And Related     Made head feel funny       Follow-up Information   Follow up with Shelda Pal, MD. Schedule an appointment as soon as possible for a visit in 2 weeks.   Specialty:  Orthopedic Surgery   Contact  information:   [address] [place] [address] [phone]        The results of significant diagnostics from this hospitalization (including imaging, microbiology, ancillary and laboratory) are listed below for reference.    Significant Diagnostic Studies: Dg Chest 2 View  05/29/2013   CLINICAL DATA:  Left chest pain under breast  EXAM: CHEST  2 VIEW  COMPARISON:  Prior chest x-ray 05/28/2013  FINDINGS: The lungs are clear and negative for focal airspace consolidation, pulmonary  edema or suspicious pulmonary nodule. The previously identified nonspecific nodular opacity in the left lung base is not well seen on the present examination and is therefore favored to have reflected the costochondral junction of the left 6th rib. No pleural effusion or pneumothorax. Cardiac and mediastinal contours are within normal limits. No acute fracture or lytic or blastic osseous lesions. Residual degenerative changes at the left coracoclavicular and acromioclavicular joints. Query history of remote prior partial surgical resection versus AC joint injury. The visualized upper abdominal bowel gas pattern is unremarkable.  IMPRESSION: No active cardiopulmonary disease.   Electronically Signed   By: Malachy Moan M.D.   On: 05/29/2013 08:22   Dg Hip Complete Right  05/27/2013   CLINICAL DATA:  Larey Seat  EXAM: RIGHT HIP - COMPLETE 2+ VIEW  COMPARISON:  04/10/2013  FINDINGS: Femoral and acetabular components of hip arthroplasty project in expected location. There is an oblique fracture of the femur at the level of the tip of the femoral stem, with several cm of overriding of fracture fragments and lateral angulation of the distal shaft fragment.  IMPRESSION: 1. Negative hip. 2. Femur fracture at the level of the tip of the femoral stem, with angulation and foreshortening. .   Electronically Signed   By: Oley Balm M.D.   On: 05/27/2013 20:34   Dg Pelvis Portable  05/28/2013   CLINICAL  DATA:  Post right femoral fracture  EXAM: PORTABLE PELVIS  COMPARISON:  Portable exam 2115 hr compared to 05/27/2013  FINDINGS: Acetabular and femoral components of a right hip prosthesis are again identified.  Oblique displaced proximal right femoral diaphyseal fracture seen on previous exam has been reduced.  A lateral plate, multiple screws and multiple cerclage wires have been placed at the right femur.  Bones appear demineralized.  Degenerative changes left hip.  IMPRESSION: Post ORIF right femur.   Electronically Signed   By: Ulyses Southward M.D.   On: 05/28/2013 21:25   Dg Chest Portable 1 View  05/28/2013   CLINICAL DATA:  Preop evaluation.  EXAM: PORTABLE CHEST - 1 VIEW  COMPARISON:  04/09/2011  FINDINGS: Single view of the chest was obtained. There is a triangular-shaped density in the left lower chest that roughly measures 8 mm. This density is in the region of overlying ribs. Otherwise, the lungs are clear without airspace disease or edema. Heart and mediastinum are within normal limits. The trachea is midline. Cortical thickening in the lateral left clavicle suggest a previous injury and there is stable widening of the left AC joint.  IMPRESSION: Indeterminate 8 mm density in the left lower chest. Findings could be associated with overlying shadows or ribs but cannot exclude a pulmonary nodule. This area can be more definitively evaluated with CT.  No evidence for active chest disease.  These results will be called to the ordering clinician or representative by the Radiologist Assistant, and communication documented in the PACS Dashboard.   Electronically Signed   By: Richarda Overlie M.D.   On: 05/28/2013 07:36    Microbiology: Recent Results (from the past 240 hour(s))  URINE CULTURE     Status: None   Collection Time    05/28/13  1:16 AM      Result Value Range Status   Specimen Description URINE, CLEAN CATCH   Final   Special Requests NONE   Final   Culture  Setup Time     Final   Value:  05/28/2013 11:05     Performed at [facility]   [place] Count     Final   Value: NO GROWTH     Performed at [facility]     Final   Value: NO GROWTH     Performed at [facility]   Report Status 05/29/2013 FINAL   Final  SURGICAL PCR SCREEN     Status: None   Collection Time    05/28/13  7:39 AM      Result Value Range Status   MRSA, PCR NEGATIVE  NEGATIVE Final   Staphylococcus aureus NEGATIVE  NEGATIVE Final   Comment:            The Xpert SA Assay (FDA     approved for NASAL specimens     in patients over 82 years of age),     is one component of     a comprehensive surveillance     program.  Test performance has     been validated by [employer] for patients greater     than or equal to 51 year old.     It is not intended     to diagnose infection nor to     guide or monitor treatment.     Labs: Basic Metabolic Panel:  Recent Labs Lab 05/27/13 2120 05/28/13 0500 05/29/13 0420 05/30/13 0443 05/31/13 0458  NA 136 134* 134* 137 136  K 2.9* 2.9* 4.7 3.7 3.8  CL 99 98 102 105 102  CO2 24 26 23 26 25   GLUCOSE 101* 101* 143* 109* 98  BUN 10 8 4* 6 5*  CREATININE 0.48* 0.51 0.47* 0.54 0.52  CALCIUM 9.4 9.1 8.9 8.8 9.1   Liver Function Tests:  Recent Labs Lab 05/27/13 2120  AST 34  ALT 25  ALKPHOS 67  BILITOT 0.6  PROT 6.9  ALBUMIN 3.8   No results found for this basename: LIPASE, AMYLASE,  in the last 168 hours No results found for this basename: AMMONIA,  in the last 168 hours CBC:  Recent Labs Lab 05/27/13 2120 05/28/13 0500 05/29/13 0420 05/30/13 0443 05/31/13 0458  WBC 9.9 8.1 7.5 7.2 7.8  NEUTROABS 6.3  --   --   --   --   HGB 9.6* 8.5* 7.4* 6.7* 10.3*  HCT 28.6* 25.5* 22.0* 20.1* 30.7*  MCV 102.5* 102.4* 102.8* 104.7* 95.3  PLT 283 221 247 253 266   Cardiac Enzymes:  Recent Labs Lab 05/27/13 2120  TROPONINI <0.30   BNP: BNP (last 3 results) No results found for this basename: PROBNP,  in  the last 8760 hours CBG: No results found for this basename: GLUCAP,  in the last 168 hours     Signed:  Rhetta Mura  [facility] 05/31/2013, 8:34 AM

## 2013-05-31 NOTE — Progress Notes (Signed)
Rehab admissions - I met with patient this am.  She would like to come to inpatient rehab.  Bed available and will admit to acute inpatient rehab today.  Call me for questions.  #[phone]

## 2013-06-01 ENCOUNTER — Inpatient Hospital Stay (HOSPITAL_COMMUNITY): Payer: MEDICAID | Admitting: *Deleted

## 2013-06-01 ENCOUNTER — Inpatient Hospital Stay (HOSPITAL_COMMUNITY): Payer: Self-pay | Admitting: Occupational Therapy

## 2013-06-01 DIAGNOSIS — Z9660 Presence of unspecified orthopedic joint implant: Secondary | ICD-10-CM

## 2013-06-01 DIAGNOSIS — T84049A Periprosthetic fracture around unspecified internal prosthetic joint, initial encounter: Secondary | ICD-10-CM

## 2013-06-01 LAB — CBC WITH DIFFERENTIAL/PLATELET
Basophils Relative: 0 % (ref 0–1)
Eosinophils Absolute: 0.2 10*3/uL (ref 0.0–0.7)
Eosinophils Relative: 3 % (ref 0–5)
HCT: 31.7 % — ABNORMAL LOW (ref 36.0–46.0)
Hemoglobin: 11.1 g/dL — ABNORMAL LOW (ref 12.0–15.0)
Lymphs Abs: 2 10*3/uL (ref 0.7–4.0)
MCH: 33.8 pg (ref 26.0–34.0)
MCHC: 35 g/dL (ref 30.0–36.0)
MCV: 96.6 fL (ref 78.0–100.0)
Monocytes Absolute: 0.8 10*3/uL (ref 0.1–1.0)
Monocytes Relative: 9 % (ref 3–12)
Neutrophils Relative %: 62 % (ref 43–77)
RDW: 16.7 % — ABNORMAL HIGH (ref 11.5–15.5)

## 2013-06-01 LAB — COMPREHENSIVE METABOLIC PANEL
Albumin: 2.8 g/dL — ABNORMAL LOW (ref 3.5–5.2)
BUN: 8 mg/dL (ref 6–23)
Calcium: 8.9 mg/dL (ref 8.4–10.5)
Creatinine, Ser: 0.55 mg/dL (ref 0.50–1.10)
GFR calc Af Amer: >90 mL/min (ref >90)
GFR calc non Af Amer: >90 mL/min (ref >90)
Glucose, Bld: 102 mg/dL — ABNORMAL HIGH (ref 70–99)
Total Bilirubin: 0.6 mg/dL (ref 0.3–1.2)
Total Protein: 6.1 g/dL (ref 6.0–8.3)

## 2013-06-01 MED ORDER — ALPRAZOLAM 0.25 MG PO TABS
0.5000 mg | ORAL_TABLET | Freq: Three times a day (TID) | ORAL | Status: DC | PRN
  Administered 2013-06-02 – 2013-06-03 (×2): 0.5 mg via ORAL
  Filled 2013-06-01 (×2): qty 2

## 2013-06-01 NOTE — Evaluation (Signed)
Physical Therapy Assessment and Plan  Patient Details  Name: Raeya Merritts MRN: [id number] Date of Birth: [birth year]  PT Diagnosis: Difficulty walking, Muscle weakness and Pain in R LE  Rehab Potential: Excellent ELOS: 7-10 days   Today's Date: 06/01/2013 Time: 0800-0900 and 1045-1130 and [id number] Time Calculation (min): 60 min and 45 min and 15 min  Problem List:  Patient Active Problem List   Diagnosis Date Noted  . Periprosthetic fracture of hip 05/31/2013  . Hip fracture 05/27/2013  . Acute blood loss anemia 04/11/2013  . S/P right THA, AA 04/10/2013    Past Medical History:  Past Medical History  Diagnosis Date  . Abnormal uterine bleeding   . Depression   . Arthritis   . Rectal bleeding     minor    Past Surgical History:  Past Surgical History  Procedure Laterality Date  . Appendectomy    . Oophorectomy    . Shoulder surgery      left shoulder  . Colonoscopy    . Total hip arthroplasty Right 04/10/2013    Procedure: RIGHT TOTAL HIP ARTHROPLASTY ANTERIOR APPROACH;  Surgeon: Shelda Pal, MD;  Location: [facility];  Service: Orthopedics;  Laterality: Right;  . Orif periprosthetic fracture Right 05/28/2013    Procedure: OPEN REDUCTION INTERNAL FIXATION (ORIF) PERIPROSTHETIC FRACTURE;  Surgeon: Shelda Pal, MD;  Location: [facility];  Service: Orthopedics;  Laterality: Right;    Assessment & Plan Clinical Impression: Patient is a 52 y.o. year old female with history of right total hip replacement anterior direct approach secondary to osteoarthritis 04/10/2013 and discharged to home 04/13/2013 with home therapies. Admitted 05/27/2013 after a fall landing on the right hip while walking through the kitchen. X-ray and imaging revealed right periprosthetic proximal femur fracture with stable prosthesis. Underwent ORIF 05/28/2013 per Dr. Charlann Boxer. Advised nonweightbearing right lower extremity. Placed on subcutaneous Lovenox for DVT prophylaxis. Acute blood loss anemia 6.7 and  transfused 05/30/2013 with follow up hemoglobin 10.3. Physical and occupational therapy evaluations completed 05/29/2013 with recommendations for physical medicine rehabilitation consult to consider inpatient rehabilitation services. Patient was felt to be a good candidate for inpatient rehabilitation services and was admitted for comprehensive rehabilitation program. Patient transferred to CIR on 05/31/2013 .   Patient currently requires minA-maxA with mobility secondary to muscle weakness, decreased cardiorespiratoy endurance and decreased standing balance, decreased balance strategies and difficulty maintaining precautions.  Prior to hospitalization, patient was modified independent  with mobility and lived with Friend(s) in a House home.  Home access is 2 steps onto porch, 1 step into house .  Patient will benefit from skilled PT intervention to maximize safe functional mobility, minimize fall risk and decrease caregiver burden for planned discharge home with intermittent assist.  Anticipate patient will benefit from follow up [facility] at discharge.  PT - End of Session Activity Tolerance: Tolerates 30+ min activity with multiple rests Endurance Deficit: Yes PT Assessment Rehab Potential: Excellent PT Patient demonstrates impairments in the following area(s): Balance;Endurance;Motor;Pain;Safety PT Transfers Functional Problem(s): Bed Mobility;Bed to Chair;Car;Furniture PT Locomotion Functional Problem(s): Ambulation;Wheelchair Mobility;Stairs PT Plan PT Intensity: Minimum of 1-2 x/day ,45 to 90 minutes PT Frequency: 5 out of 7 days PT Duration Estimated Length of Stay: 7-10 days PT Treatment/Interventions: Ambulation/gait training;Balance/vestibular training;Community reintegration;Discharge planning;Disease management/prevention;DME/adaptive equipment instruction;Functional mobility training;Neuromuscular re-education;Pain management;Patient/family education;Psychosocial  support;Splinting/orthotics;Stair training;Therapeutic Activities;Therapeutic Exercise;UE/LE Strength taining/ROM;UE/LE Coordination activities;Visual/perceptual remediation/compensation;Wheelchair propulsion/positioning PT Transfers Anticipated Outcome(s): modI all transfers RW, supervision car transfer RW PT Locomotion Anticipated Outcome(s): modI ambulation RW 30', supervision wheelchair  moblity 150' PT Recommendation Follow Up Recommendations: Home health PT Patient destination: Home  Skilled Therapeutic Intervention AM Session #1: Patient received supine in bed. Skilled therapeutic intervention initiated following evaluation. Session focused on bed mobility, functional transfers, wheelchair mobility, gait training, and stair negotiation. See details below. Patient able to verbalize understanding of NWB restrictions of R LE, re-educated on R hip precautions (anterior approach), no hip extension, ER, or Adduction, patient verbalized and demonstrated understanding during functional mobility. Patient requested use of bathroom, stand pivot transfer wheelchair<>toilet minA, minA for standing balance and able to manage clothing and hygiene without assistance. Patient seated in wheelchair able to manage hand hygiene without assistance. Patient reported increase in R hip pain following gait and stair training 8-9/10. Ended session patient in room, stand pivot transfer wheelchair>BSC minA, following transfer R LE elevated on wheelchair for support, patient remained seated on [facility] with all needs within reach and nursing present.   AM Session #2:  Patient received supine in bed. Session focused on functional transfers, bed mobility and wheeelchair mobility. Patient supine>sit HOB elevated and R bedrail supervision level. Bed>wheelchair stand pivot transfer with RW minA, verbal cues for R LE NWB and for not allowing any contact with R LE with floor. Patient instructed in wheelchair mobility 100'x1 supervision level  B UE. Stand pivot transfer with RW wheelchair>elevated mat table minA. Patient reports feeling "woozy" vitals taken seated edge of mat O2 97%, HR 87, BP 121/85. Patient instructed in 5x sit<>stand from elevated mat table supervision level and 5x sit<>stand from low level mat table with RW for B UE support in standing (supervision for standing balance), for L LE strengthening and emphasis on positioning of R LE off of floor prior to standing to abide by NWB precautions of R LE when transferring sit<>stand. Patient able to demonstrate sit<>stand with NWB R LE with verbal cues for technique and appropriate pacing.  Attempted gait training, sit>stand transfer from mat attempted 3 trials, patient reports L LE feels weak and is "giving out", unable to attempt ambulation. Patient attempted lateral scoot transfer mat>wheelchair, patient did not complete scoot to chair, stated "I need to stand up", performed stand pivot transfer minA to wheelchair. Discussion with patient regarding safety during transfers and verbalizing what she needs and waiting for therapist to achieve appropriate positioning prior to standing or transferring. Patient returned to room totalA wheelchair mobility. Stand pivot transfer wheelchair>bed RW, minA. Sit>supine HOB elevated, R bedrail supervision level, patient able to maintain hip precautions when positioning in bed. Ended session patient supine in bed, alarm on and all need within reach.  PM session: Patient received sleeping in bed, easily aroused. Session focused on supine bed exercises for B LE strengthening. Patient reports R hip pain 9/10, is agreeable to participate in bed exercises. Patient instructed in L LE exercises 1x10 reps heel slides and SLR; 1x15 B ankle pumps. Patient also instructed in glut sets 1x10 reps and R LE quad sets 1x10 reps. Ended session patient supine in bed, alarm on with all needs within reach.   PT Evaluation Precautions/Restrictions Precautions Precautions:  Anterior Hip;Fall Restrictions Weight Bearing Restrictions: Yes RLE Weight Bearing: Non weight bearing General Amount of Missed PT Time (min): 15 Minutes Missed Time Reason: Pain  Pain Pain Assessment Pain Assessment: No/denies pain Pain Score: 0-No pain Home Living/Prior Functioning Home Living Available Help at Discharge: Available PRN/intermittently Type of Home: House Home Access: Stairs to enter [employer] of Steps: 2 steps onto porch, 1 step into house  Entrance Stairs-Rails: None Home Layout: One level Additional Comments: unable to enter bathroom with RW   Lives With: Friend(s) Prior Function Level of Independence: Needs assistance with homemaking;Needs assistance with ADLs;Requires assistive device for independence  Able to Take Stairs?: Yes Driving: No Vocation: Unemployed Vision/Perception  Vision - History Baseline Vision: Wears glasses only for reading Patient Visual Report: No change from baseline  Cognition Arousal/Alertness: Awake/alert Orientation Level: Oriented X4 Attention: Focused;Sustained Focused Attention: Appears intact Sustained Attention: Appears intact Safety/Judgment: Appears intact Sensation Sensation Light Touch: Appears Intact Proprioception: Appears Intact Coordination Gross Motor Movements are Fluid and Coordinated: No Fine Motor Movements are Fluid and Coordinated: No Coordination and Movement Description: Rapid alternating movements intact B UE Motor  Motor Motor: Other (comment) (R THA and R LE ORIF) Motor - Skilled Clinical Observations: NWB R LE, anterior hip precautions R LE  Mobility Bed Mobility Bed Mobility: Supine to Sit Supine to Sit: 5: Supervision;With rails;HOB elevated Supine to Sit Details: Verbal cues for technique;Verbal cues for precautions/safety Supine to Sit Details (indicate cue type and reason): Patient supine>sit HOB elevated and R bedrail supervision.  Transfers Transfers: Yes Sit to  Stand: 4: Min assist Sit to Stand Details: Verbal cues for technique;Verbal cues for precautions/safety;Tactile cues for placement Sit to Stand Details (indicate cue type and reason): Patient sit>stand from wheelchair minA, verbal cues for R hip precautions and NWB through R LE.  Stand to Sit: 4: Min assist Stand to Sit Details (indicate cue type and reason): Verbal cues for technique;Verbal cues for precautions/safety Stand to Sit Details: Stand>sit to wheelchair minA, verbal cues for advancement of R LE anterior and lifting off floor prior to sitting for NWB.  Stand Pivot Transfers: 4: Min Actuary Details: Verbal cues for sequencing;Verbal cues for technique;Verbal cues for precautions/safety;Verbal cues for gait pattern Stand Pivot Transfer Details (indicate cue type and reason): Patient stand pivot transfer bed>wheelchair, wheelchair<>toilet minA, with verbal cues for NWB R LE and for taking pivot steps with LLE.  Locomotion  Ambulation Ambulation: Yes Ambulation/Gait Assistance: 3: Mod assist Ambulation Distance (Feet): 10 Feet Assistive device: Rolling Bold Ambulation/Gait Assistance Details: Visual cues for safe use of DME/AE;Visual cues/gestures for precautions/safety;Visual cues/gestures for sequencing;Verbal cues for sequencing;Verbal cues for technique;Verbal cues for precautions/safety;Verbal cues for gait pattern;Verbal cues for safe use of DME/AE Ambulation/Gait Assistance Details: Patient instructed in gait training 10'x1 RW modA, visual demonstration for gait sequencing and technique secondary to NWB status RLE. Verbal cues for not placing R LE on floor and for using B UE to assist lifting body when advancing LLE.  Gait Gait: Yes Gait Pattern: Impaired Gait Pattern: Step-to pattern;Trunk flexed Stairs / Additional Locomotion Stairs: Yes Stairs Assistance: 2: Max assist Stairs Assistance Details: Visual cues/gestures for precautions/safety;Visual  cues/gestures for sequencing;Verbal cues for sequencing;Verbal cues for technique;Verbal cues for gait pattern;Verbal cues for precautions/safety Stairs Assistance Details (indicate cue type and reason): Patient instructed in negotiation 1 step maxA B handrails, visual demonstration for use of B UEs to assist lifitng body to allow L LE to ascend step. Patient able to demonstrate ascending step forward, verbal cues for NWB RLE, and descending step backwards LLE.  Stair Management Technique: Two rails;Step to pattern Number of Stairs: 1 Height of Stairs: 5 Wheelchair Mobility Wheelchair Mobility: Yes Wheelchair Assistance: 5: Financial planner Details: Verbal cues for technique;Verbal cues for Engineer, drilling: Both upper extremities Wheelchair Parts Management: Needs assistance Distance: 100'x1  Trunk/Postural Assessment  Cervical Assessment Cervical Assessment: Within Functional Limits  Thoracic Assessment Thoracic Assessment: Within Functional Limits Lumbar Assessment Lumbar Assessment: Within Functional Limits Postural Control Postural Control: Deficits on evaluation Postural Limitations: flexed trunk posture   Balance Balance Balance Assessed: Yes Static Sitting Balance Static Sitting - Balance Support: Bilateral upper extremity supported;Feet supported (L LE only ) Static Sitting - Level of Assistance: 5: Stand by assistance Static Sitting - Comment/# of Minutes: 3 Static Standing Balance Static Standing - Balance Support: Bilateral upper extremity supported;During functional activity Static Standing - Level of Assistance: 4: Min assist Static Standing - Comment/# of Minutes: 30" Extremity Assessment  RLE Assessment RLE Assessment: Exceptions to [facility] RLE Strength RLE Overall Strength: Due to precautions;Due to pain RLE Overall Strength Comments: grossly 3/5 hip flex, knee flex/ext, DF/PF. Formal MMT not performed, assessed strength  against gravity.  LLE Assessment LLE Assessment: Within Functional Limits LLE Strength LLE Overall Strength: Within Functional Limits for tasks assessed LLE Overall Strength Comments: grossly 5/5, hip flexion 3+/5 secondary to pain in R LE  FIM:  FIM - Bed/Chair Transfer Bed/Chair Transfer Assistive Devices: Bed rails;Arm rests;HOB elevated Bed/Chair Transfer: 5: Supine > Sit: Supervision (verbal cues/safety issues);4: Bed > Chair or W/C: Min A (steadying Pt. > 75%);4: Chair or W/C > Bed: Min A (steadying Pt. > 75%) FIM - Locomotion: Wheelchair Distance: 100' Locomotion: Wheelchair: 2: Travels 50 - 149 ft with supervision, cueing or coaxing FIM - Locomotion: Ambulation Locomotion: Ambulation Assistive Devices: Designer, industrial/product Ambulation/Gait Assistance: 3: Mod assist Locomotion: Ambulation: 1: Travels less than 50 ft with moderate assistance (Pt: 50 - 74%) FIM - Locomotion: Stairs Locomotion: Building control surveyor: Hand rail - 2 Locomotion: Stairs: 1: Up and Down < 4 stairs with maximal assistance (Pt: 25 - 49%)   Refer to Care Plan for Long Term Goals  Recommendations for other services: None  Discharge Criteria: Patient will be discharged from PT if patient refuses treatment 3 consecutive times without medical reason, if treatment goals not met, if there is a change in medical status, if patient makes no progress towards goals or if patient is discharged from hospital.  The above assessment, treatment plan, treatment alternatives and goals were discussed and mutually agreed upon: by patient  Doralee Albino 06/01/2013, 1:25 PM

## 2013-06-01 NOTE — Progress Notes (Signed)
Patient information reviewed and entered into e[facility] system by Vivek Grealish, RN, CRRN, PPS Coordinator.  Information including medical coding and functional independence measure will be reviewed and updated through discharge.    

## 2013-06-01 NOTE — Evaluation (Signed)
Occupational Therapy Assessment and Plan  Patient Details  Name: Jaime Phillips MRN: [id number] Date of Birth: [birth year]  OT Diagnosis: acute pain and muscle weakness (generalized) Rehab Potential: Rehab Potential: Good ELOS:7-10 days  Today's Date: 06/01/2013 Time: 0900-1000 Time Calculation (min): 60 min  Problem List:  Patient Active Problem List   Diagnosis Date Noted  . Periprosthetic fracture of hip 05/31/2013  . Hip fracture 05/27/2013  . Acute blood loss anemia 04/11/2013  . S/P right THA, AA 04/10/2013    Past Medical History:  Past Medical History  Diagnosis Date  . Abnormal uterine bleeding   . Depression   . Arthritis   . Rectal bleeding     minor    Past Surgical History:  Past Surgical History  Procedure Laterality Date  . Appendectomy    . Oophorectomy    . Shoulder surgery      left shoulder  . Colonoscopy    . Total hip arthroplasty Right 04/10/2013    Procedure: RIGHT TOTAL HIP ARTHROPLASTY ANTERIOR APPROACH;  Surgeon: Shelda Pal, MD;  Location: [facility];  Service: Orthopedics;  Laterality: Right;  . Orif periprosthetic fracture Right 05/28/2013    Procedure: OPEN REDUCTION INTERNAL FIXATION (ORIF) PERIPROSTHETIC FRACTURE;  Surgeon: Shelda Pal, MD;  Location: [facility];  Service: Orthopedics;  Laterality: Right;    Assessment & Plan Clinical Impression: Patient is a 52 y.o. year old female right-handed female with history of right total hip replacement anterior direct approach secondary to osteoarthritis 04/10/2013 and discharged to home 04/13/2013 with home therapies. Admitted 05/27/2013 after a fall landing on the right hip while walking through the kitchen. X-ray and imaging revealed right periprosthetic proximal femur fracture with stable prosthesis. Underwent ORIF 05/28/2013 per Dr. Charlann Boxer. Advised nonweightbearing right lower extremity. Placed on subcutaneous Lovenox for DVT prophylaxis. Acute blood loss anemia 6.7 and transfused 05/30/2013 with  follow up hemoglobin 10.3. Physical and occupational therapy evaluations completed 05/29/2013 with recommendations for physical medicine rehabilitation consult to consider inpatient rehabilitation services.  Patient transferred to CIR on 05/31/2013 .    Patient currently requires min with basic self-care skills and IADL secondary to muscle weakness and acute pain and edema, decreased cardiorespiratoy endurance and decreased standing balance, decreased balance strategies and difficulty maintaining precautions.  Prior to hospitalization, patient could complete ADL with modified independent .  Patient will benefit from skilled intervention to decrease level of assist with basic self-care skills and increase independence with basic self-care skills prior to discharge home with care partner.  Anticipate patient will require intermittent supervision and with IADLs and follow up home health.  OT - End of Session Activity Tolerance: Improving Endurance Deficit: Yes OT Assessment Rehab Potential: Good OT Patient demonstrates impairments in the following area(s): Balance;Edema;Endurance;Motor;Pain;Skin Integrity OT Basic ADL's Functional Problem(s): Bathing;Dressing;Toileting OT Advanced ADL's Functional Problem(s): Simple Meal Preparation OT Transfers Functional Problem(s): Toilet;Tub/Shower OT Plan OT Intensity: Minimum of 1-2 x/day, 45 to 90 minutes OT Frequency: 5 out of 7 days OT Duration/Estimated Length of Stay: 5-7 days OT Treatment/Interventions: Balance/vestibular training;Community reintegration;Discharge planning;DME/adaptive equipment instruction;Neuromuscular re-education;Functional mobility training;Pain management;Skin care/wound managment;Self Care/advanced ADL retraining;Patient/family education;Therapeutic Activities;UE/LE Strength taining/ROM;UE/LE Coordination activities;Therapeutic Exercise OT Basic Self-Care Anticipated Outcome(s): mod I  OT Toileting Anticipated Outcome(s): Mod I   OT Bathroom Transfers Anticipated Outcome(s): mod I  OT Recommendation Patient destination: Home Follow Up Recommendations: Home health OT Equipment Recommended: Tub/shower seat   Skilled Therapeutic Intervention OT eval initiated with OT goals, purpose and role discussed as well as d/c  planning. Self care retraining at shower level in room sitting on tub bench. Pt limited by pain in right LE and being able to tolerate her LE in a dependent position during ADL for any length of time. Pt required cuing through out the session with dynamic movement to maintain weight bearing precautions. Pt able to thread bilateral pants with setup and required steady A for standing for clothing management. Pt with increased pain at end of session and requested to return to bed. Transferred stand step with RW with steady A and mod A to lay supine.   OT Evaluation Precautions/Restrictions  Precautions Precautions: Fall Restrictions Weight Bearing Restrictions: Yes RLE Weight Bearing: Non weight bearing General Chart Reviewed: Yes Family/Caregiver Present: No    Pain Pain Assessment Pain Assessment: 0-10 Pain Score: 9  Pain Location: Hip Pain Orientation: Right Pain Descriptors / Indicators: Aching Pain Frequency: Several days a week Pain Onset: Gradual Patients Stated Pain Goal: 3 Pain Intervention(s): Medication (See eMAR);Repositioned Multiple Pain Sites: No Home Living/Prior Functioning Home Living Living Arrangements: Spouse/significant other Available Help at Discharge: Available PRN/intermittently Type of Home: House Home Access: Stairs to enter [employer] of Steps: 2 steps onto porch, 1 step into house  Entrance Stairs-Rails: None Home Layout: One level Additional Comments: unable to enter bathroom with RW   Lives With: Friend(s) Prior Function Level of Independence: Needs assistance with homemaking;Needs assistance with ADLs;Requires assistive device for independence   Able to Take Stairs?: Yes Driving: No Vocation: Unemployed ADL  see FIM Vision/Perception  Vision - History Baseline Vision: No visual deficits Patient Visual Report: No change from baseline Vision - Assessment Eye Alignment: Within Functional Limits Perception Perception: Within Functional Limits Praxis Praxis: Intact  Cognition Overall Cognitive Status: Within Functional Limits for tasks assessed Arousal/Alertness: Awake/alert Orientation Level: Oriented X4 Attention: Focused;Sustained Focused Attention: Appears intact Sustained Attention: Appears intact Memory: Appears intact Safety/Judgment: Appears intact Sensation Sensation Light Touch: Appears Intact Proprioception: Appears Intact Coordination Gross Motor Movements are Fluid and Coordinated: No (not in right LE) Fine Motor Movements are Fluid and Coordinated: Yes Coordination and Movement Description: Rapid alternating movements intact B UE Motor  Motor Motor: Other (comment) (R THA and R LE ORIF) Motor - Skilled Clinical Observations: NWB R LE, hip precautions R LE (anterior approach)  Motor - Discharge Observations: generalized weakness and pain in right LE Mobility  Bed Mobility Bed Mobility: Sit to Supine Supine to Sit: 5: Supervision;With rails;HOB elevated Supine to Sit Details: Verbal cues for technique;Verbal cues for precautions/safety Supine to Sit Details (indicate cue type and reason): Patient supine>sit HOB elevated and R bedrail supervision.  Sit to Supine: 3: Mod assist Sit to Supine - Details (indicate cue type and reason): A for bilateral LEs Transfers Transfers: Sit to Stand;Stand to Sit Sit to Stand: 4: Min assist Sit to Stand Details: Verbal cues for technique;Verbal cues for precautions/safety;Tactile cues for placement Sit to Stand Details (indicate cue type and reason): Patient sit>stand from wheelchair minA, verbal cues for R hip precautions and NWB through R LE.  Stand to Sit: 4: Min  assist Stand to Sit Details (indicate cue type and reason): Verbal cues for technique;Verbal cues for precautions/safety Stand to Sit Details: Stand>sit to wheelchair minA, verbal cues for advancement of R LE anterior and lifting off floor prior to sitting for NWB.   Trunk/Postural Assessment  Cervical Assessment Cervical Assessment: Within Functional Limits Thoracic Assessment Thoracic Assessment: Within Functional Limits Lumbar Assessment Lumbar Assessment: Within Functional Limits Postural Control Postural Control:  Deficits on evaluation Postural Limitations: flexed trunk posture   Balance Balance Balance Assessed: Yes Static Sitting Balance Static Sitting - Balance Support: Bilateral upper extremity supported;Feet supported (L LE only ) Static Sitting - Level of Assistance: 5: Stand by assistance Static Sitting - Comment/# of Minutes: 3 Static Standing Balance Static Standing - Balance Support: Bilateral upper extremity supported;During functional activity Static Standing - Level of Assistance: 4: Min assist Static Standing - Comment/# of Minutes: 30" Dynamic Standing Balance Dynamic Standing - Level of Assistance: 4: Min assist Extremity/Trunk Assessment RUE Assessment RUE Assessment: Within Functional Limits LUE Assessment LUE Assessment: Within Functional Limits  FIM:  FIM - Eating Eating Activity: 6: More than reasonable amount of time FIM - Grooming Grooming Steps: Wash, rinse, dry face;Wash, rinse, dry hands;Brush, comb hair Grooming: 5: Supervision: safety issues or verbal cues FIM - Bathing Bathing Steps Patient Completed: Chest;Right Arm;Left Arm;Abdomen;Left upper leg;Right upper leg;Buttocks;Front perineal area Bathing: 4: Min-Patient completes 8-9 58f 10 parts or 75+ percent FIM - Upper Body Dressing/Undressing Upper body dressing/undressing steps patient completed: Thread/unthread right sleeve of pullover shirt/dresss;Thread/unthread left sleeve of pullover  shirt/dress;Put head through opening of pull over shirt/dress;Pull shirt over trunk Upper body dressing/undressing: 5: Set-up assist to: Obtain clothing/put away FIM - Lower Body Dressing/Undressing Lower body dressing/undressing steps patient completed: Thread/unthread right underwear leg;Thread/unthread left underwear leg;Thread/unthread right pants leg;Thread/unthread left pants leg Lower body dressing/undressing: 3: Mod-Patient completed 50-74% of tasks FIM - Banker Devices: Bed rails;Arm rests;HOB elevated Bed/Chair Transfer: 5: Supine > Sit: Supervision (verbal cues/safety issues);4: Bed > Chair or W/C: Min A (steadying Pt. > 75%);4: Chair or W/C > Bed: Min A (steadying Pt. > 75%) FIM - Tub/Shower Transfers Tub/Shower Assistive Devices: Tub transfer bench;Grab bars Tub/shower Transfers: 4-Into Tub/Shower: Min A (steadying Pt. > 75%/lift 1 leg);4-Out of Tub/Shower: Min A (steadying Pt. > 75%/lift 1 leg)   Refer to Care Plan for Long Term Goals  Recommendations for other services: None  Discharge Criteria: Patient will be discharged from OT if patient refuses treatment 3 consecutive times without medical reason, if treatment goals not met, if there is a change in medical status, if patient makes no progress towards goals or if patient is discharged from hospital.  The above assessment, treatment plan, treatment alternatives and goals were discussed and mutually agreed upon: by patient  Adan Sis 06/01/2013, 11:25 AM

## 2013-06-01 NOTE — Evaluation (Signed)
I have reviewed and agree with the treatment as reflected in this note. Gerry Heaphy, PT DPT  

## 2013-06-01 NOTE — Care Management Note (Signed)
[facility] Individual Statement of Services  Patient Name:  Jaime Phillips  Date:  06/01/2013  Welcome [facility] the [facility].  Our goal is to provide you with an individualized program based on your diagnosis and situation, designed to meet your specific needs.  With this comprehensive rehabilitation program, you will be expected to participate in at least 3 hours of rehabilitation therapies Monday-Friday, with modified therapy programming on the weekends.  Your rehabilitation program will include the following services:  Physical Therapy (PT), Occupational Therapy (OT), 24 hour per day rehabilitation nursing, Case Management (Social Worker), Rehabilitation Medicine, Nutrition Services and Pharmacy Services  Weekly team conferences will be held on Wednesday to discuss your progress.  Your Social Worker will talk with you frequently to get your input and to update you on team discussions.  Team conferences with you and your family in attendance may also be held.  Expected length of stay: 7-10 days Overall anticipated outcome: mod/i-supervision level  Depending on your progress and recovery, your program may change. Your Social Worker will coordinate services and will keep you informed of any changes. Your Social Worker's name and contact numbers are listed  below.  The following services may also be recommended but are not provided by the [facility]:   Driving Evaluations  Home Health Rehabiltiation Services  Outpatient Rehabilitation Services    Arrangements will be made to provide these services after discharge if needed.  Arrangements include referral to agencies that provide these services.  Your insurance has been verified to be:  Medicaid pending Your primary doctor is:  None  Pertinent information will be shared with your doctor and your insurance company.  Social Worker:  Dossie Der, SW [phone] or ([phone]  Information discussed with and copy given to patient by: Lucy Chris, 06/01/2013, 10:44 AM

## 2013-06-01 NOTE — Interval H&P Note (Signed)
Jaime Phillips was admitted today to Inpatient Rehabilitation with the diagnosis of right periprosthetic hip fx.  The patient's history has been reviewed, patient examined, and there is no change in status.  Patient continues to be appropriate for intensive inpatient rehabilitation.  I have reviewed the patient's chart and labs.  Questions were answered to the patient's satisfaction.  This encounter and document were completed on 05/31/13 by Dr. Wynn Banker and are being place in the rehab encounter for the purpose of charting.   SWARTZ,ZACHARY T 06/01/2013, 8:34 AM

## 2013-06-01 NOTE — Progress Notes (Signed)
Social Work Assessment and Plan Social Work Assessment and Plan  Patient Details  Name: Jaime Phillips MRN: [id number] Date of Birth: 04-06-1961  Today's Date: 06/01/2013  Problem List:  Patient Active Problem List   Diagnosis Date Noted  . Periprosthetic fracture of hip 05/31/2013  . Hip fracture 05/27/2013  . Acute blood loss anemia 04/11/2013  . S/P right THA, AA 04/10/2013   Past Medical History:  Past Medical History  Diagnosis Date  . Abnormal uterine bleeding   . Depression   . Arthritis   . Rectal bleeding     minor    Past Surgical History:  Past Surgical History  Procedure Laterality Date  . Appendectomy    . Oophorectomy    . Shoulder surgery      left shoulder  . Colonoscopy    . Total hip arthroplasty Right 04/10/2013    Procedure: RIGHT TOTAL HIP ARTHROPLASTY ANTERIOR APPROACH;  Surgeon: Shelda Pal, MD;  Location: [facility];  Service: Orthopedics;  Laterality: Right;  . Orif periprosthetic fracture Right 05/28/2013    Procedure: OPEN REDUCTION INTERNAL FIXATION (ORIF) PERIPROSTHETIC FRACTURE;  Surgeon: Shelda Pal, MD;  Location: [facility];  Service: Orthopedics;  Laterality: Right;   Social History:  reports that she has been smoking Cigarettes.  She has a 12.5 pack-year smoking history. She has never used smokeless tobacco. She reports that she drinks about 1.8 ounces of alcohol per week. She reports that she does not use illicit drugs.  Family / Support Systems Marital Status: Single Patient Roles: Partner;Parent (Sister) Spouse/Significant Other: Assunta Curtis  [phone] Children: Local son Other Supports: Sister Anticipated Caregiver: Boyfriend intermittely or sister Ability/Limitations of Caregiver: Does not have 24 hr care-Arthur works Engineer, structural Availability: Intermittent Family Dynamics: Pt feels between her boyfriend, sister and son she has good support.  She relies upon herself and has been successful with this.  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  She feels she needs to stay a week then she will be doing well to return home.  She thought she was doing well until she fell. Recent Psychosocial Issues: Other medical issues-other fractures this past year. Pyschiatric History: History of depression is not currently taking anything for this.  She feels she is doing ok her pain issues are the main concern right now.  Will continue to monitor while here Substance Abuse History: Tobacco unsure if she will quit may cut back  Patient / Family Perceptions, Expectations & Goals Pt/Family understanding of illness & functional limitations: Pt has a god understanding of her fracture and hip precautions.  She feels she will do well here and be here a short time.   Premorbid pt/family roles/activities: Girlfriend, Mother, Sister, Friend, etc Anticipated changes in roles/activities/participation: resume Pt/family expectations/goals: Pt states; " I want to be able to take care of myself."  She should be able to accomplish this before she leaves here.  Community Resources [employer]: Other (Comment) (Given info on [employer]) Premorbid Home Care/DME Agencies: None Transportation available at discharge: Sister or [facility]  Arrangements: Spouse/significant other Support Systems: Spouse/significant  other;Children;Other relatives;Friends/neighbors Type of Residence: Private residence Insurance Resources: Customer service manager Resources: Family Support Financial Screen Referred: Yes Living Expenses: Rent Money Management: Significant Other Does the patient have any problems obtaining your medications?: Yes (Describe) (No insurance) Home Management: Self Patient/Family Preliminary Plans: Return home with Merton Border who is there in the evenings and in and out.  He works day time.  Her sister has offered to have her come to her home if more care is needed.  Social Work Anticipated Follow Up Needs: HH/OP  Clinical Impression Pleasant somewhat anxious patient who wants to regain her independence but is dealing with her pain issues right now.  She should do well here and be able to return home with  Intermittent assistance.  SSD pending and will see if needs to apply for Medicaid while here.  Lucy Chris 06/01/2013, 10:58 AM

## 2013-06-01 NOTE — H&P (View-Only) (Signed)
Physical Medicine and Rehabilitation Admission H&P    Chief Complaint  Patient presents with  . Hip Pain  :  Chief complaint: Right hip pain  HPI: Jaime Phillips is a 52 y.o. right-handed female with history of right total hip replacement anterior direct approach secondary to osteoarthritis 04/10/2013 and discharged to home 04/13/2013 with home therapies. Admitted 05/27/2013 after a fall landing on the right hip while walking through the kitchen. X-ray and imaging revealed right periprosthetic proximal femur fracture with stable prosthesis. Underwent ORIF 05/28/2013 per Dr. Charlann Boxer. Advised nonweightbearing right lower extremity. Placed on subcutaneous Lovenox for DVT prophylaxis. Acute blood loss anemia 6.7 and transfused 05/30/2013 with follow up hemoglobin 10.3. Physical and occupational therapy evaluations completed 05/29/2013 with recommendations for physical medicine rehabilitation consult to consider inpatient rehabilitation services. Patient was felt to be a good candidate for inpatient rehabilitation services and was admitted for comprehensive rehabilitation program  Patient feels much better after transfusion. Still has right lower extremity pain He has a history of left shoulder surgery as well as left wrist fracture last year  ROS Review of Systems  Gastrointestinal:  GERD  Musculoskeletal: Positive for joint pain and myalgias.  Psychiatric/Behavioral: Positive for depression.  All other systems reviewed and are negative  Past Medical History  Diagnosis Date  . Abnormal uterine bleeding   . Depression   . Arthritis   . Rectal bleeding     minor    Past Surgical History  Procedure Laterality Date  . Appendectomy    . Oophorectomy    . Shoulder surgery      left shoulder  . Colonoscopy    . Total hip arthroplasty Right 04/10/2013    Procedure: RIGHT TOTAL HIP ARTHROPLASTY ANTERIOR APPROACH;  Surgeon: Shelda Pal, MD;  Location: [facility];  Service: Orthopedics;   Laterality: Right;  . Orif periprosthetic fracture Right 05/28/2013    Procedure: OPEN REDUCTION INTERNAL FIXATION (ORIF) PERIPROSTHETIC FRACTURE;  Surgeon: Shelda Pal, MD;  Location: [facility];  Service: Orthopedics;  Laterality: Right;   Family History  Problem Relation Age of Onset  . Diabetes Mother   . Kidney disease Mother   . Heart disease Father   . Colon cancer Father     "father may have had colon cancer"  . Cancer Sister     breast   Social History:  reports that she has been smoking Cigarettes.  She has a 12.5 pack-year smoking history. She has never used smokeless tobacco. She reports that she drinks about 1.8 ounces of alcohol per week. She reports that she does not use illicit drugs. Allergies:  Allergies  Allergen Reactions  . Codeine Other (See Comments)    hallucinating  . Morphine And Related     Made head feel funny   Medications Prior to Admission  Medication Sig Dispense Refill  . acetaminophen (TYLENOL) 500 MG tablet Take 500 mg by mouth every 6 (six) hours as needed (pain). For pain.      . Coenzyme Q10 (CO Q 10 PO) Take by mouth daily.      . ferrous sulfate 325 (65 FE) MG tablet Take 1 tablet (325 mg total) by mouth 3 (three) times daily after meals.    3  . methocarbamol (ROBAXIN) 500 MG tablet Take 1 tablet (500 mg total) by mouth every 6 (six) hours as needed (muscle spasms).  50 tablet  0  . Multiple Vitamin (MULTIVITAMIN WITH MINERALS) TABS tablet Take 1 tablet by mouth daily.      Marland Kitchen  oxyCODONE (OXY IR/ROXICODONE) 5 MG immediate release tablet Take 1-2 tablets (5-10 mg total) by mouth every 4 (four) hours as needed for pain.  60 tablet  0  . ranitidine (ZANTAC) 150 MG tablet Take 1 tablet (150 mg total) by mouth 2 (two) times daily.      . vitamin E 400 UNIT capsule Take 400 Units by mouth daily.        Home: Home Living Family/patient expects to be discharged to:: Private residence Living Arrangements: Alone;Non-relatives/Friends Available Help  at Discharge: Family;Available PRN/intermittently Type of Home: House Home Access: Stairs to enter [employer] of Steps: 2 Entrance Stairs-Rails: None Home Layout: One level Home Equipment: Bedside commode;[place] - 2 wheels;Adaptive equipment Adaptive Equipment: Reacher;Sock aid;Long-handled shoe horn;Long-handled sponge   Functional History:    Functional Status:  Mobility: Bed Mobility Bed Mobility: Sit to Supine Supine to Sit: 3: Mod assist Sit to Supine: 3: Mod assist Transfers Transfers: Sit to Stand;Stand to Sit Sit to Stand: From chair/3-in-1;3: Mod assist;With upper extremity assist;With armrests Sit to Stand: Patient Percentage: 60% Stand to Sit: To bed;With upper extremity assist;3: Mod assist Stand Pivot Transfers: 3: Mod assist Ambulation/Gait Ambulation/Gait Assistance: 1: +2 Total assist (for safety) Ambulation/Gait: Patient Percentage: 60% Ambulation Distance (Feet): 5 Feet Assistive device: Rolling [place] Gait Pattern: Step-to pattern;Trunk flexed Gait velocity: decreased    ADL: ADL Eating/Feeding: Simulated;Independent Where Assessed - Eating/Feeding: Bed level Grooming: Performed;Wash/dry face;Brushing hair;Set up Where Assessed - Grooming: Unsupported sitting Upper Body Bathing: Performed;Chest;Right arm;Left arm;Abdomen;Set up;Supervision/safety Where Assessed - Upper Body Bathing: Unsupported sitting Lower Body Bathing: +2 Total assistance Where Assessed - Lower Body Bathing: Supported sit to stand Upper Body Dressing: Performed;Supervision/safety;Set up Where Assessed - Upper Body Dressing: Unsupported sitting Lower Body Dressing: Simulated;+2 Total assistance (without AE) Where Assessed - Lower Body Dressing: Supported sit to stand Toilet Transfer: Simulated;+2 Total assistance Toilet Transfer Method: Stand pivot Equipment Used: Rolling Trulson;Gait belt ADL Comments: Pt sat with R LE supported on bed and L LE over EOB to wash UB and  groom. Stood at [facility] with therapists assist to wash periareas and did well with NWB on R LE. Pivot around to chair to sit up. Pt with some fatigue after grooming, bathing and functional transfer. Pt has used AE PTA so she is familiar with use. Pt is motivated to do for herself and wanted to do as much as she could this session.   Cognition: Cognition Overall Cognitive Status: Within Functional Limits for tasks assessed Orientation Level: Oriented X4 Cognition Arousal/Alertness: Awake/alert Behavior During Therapy: WFL for tasks assessed/performed Overall Cognitive Status: Within Functional Limits for tasks assessed  Physical Exam: Blood pressure 118/78, pulse 86, temperature 97.8 F (36.6 C), temperature source Oral, resp. rate 18, height 5\' 3"  (1.6 m), weight 61.236 kg (135 lb), SpO2 99.00%. Physical Exam Constitutional: She is oriented to person, place, and time. She appears well-developed.  HENT:  Head: Normocephalic.  Eyes: EOM are normal.  Neck: Normal range of motion. Neck supple. No thyromegaly present.  Cardiovascular: Normal rate and regular rhythm.  Respiratory: Effort normal and breath sounds normal. No respiratory distress.  GI: Soft. Bowel sounds are normal. She exhibits no distension.  Musculoskeletal:  Right hip incision dressed, small amount of fresh blood along the staple line. Initial incision well healed. RLE with 2+ edema. Appropriately tender. Able to move left hip,LE without substantial pain  . Ecchymosis along the right medial thigh  Neurological: She is alert and oriented to person, place, and time.  Patient follows full commands. She is a bit anxious during exam. Limbs NVI  Skin:  Hip incision is dressed and appropriately tender  Psychiatric: She has a normal mood and affect. Her behavior is normal. Judgment and thought content normal Motor strength is 5/5 in bilateral bicep, tricep, right grip, 4 minus left deltoid and left grip related to pain, 5/5 right  deltoid Results for orders placed during the hospital encounter of 05/27/13 (from the past 48 hour(s))  CBC     Status: Abnormal   Collection Time    05/29/13  4:20 AM      Result Value Range   WBC 7.5  4.0 - 10.5 K/uL   RBC 2.14 (*) 3.87 - 5.11 MIL/uL   Hemoglobin 7.4 (*) 12.0 - 15.0 g/dL   HCT 45.4 (*) 09.8 - 11.9 %   MCV 102.8 (*) 78.0 - 100.0 fL   MCH 34.6 (*) 26.0 - 34.0 pg   MCHC 33.6  30.0 - 36.0 g/dL   RDW 14.7  82.9 - 56.2 %   Platelets 247  150 - 400 K/uL  BASIC METABOLIC PANEL     Status: Abnormal   Collection Time    05/29/13  4:20 AM      Result Value Range   Sodium 134 (*) 135 - 145 mEq/L   Potassium 4.7  3.5 - 5.1 mEq/L   Comment: NO VISIBLE HEMOLYSIS     DELTA CHECK NOTED   Chloride 102  96 - 112 mEq/L   CO2 23  19 - 32 mEq/L   Glucose, Bld 143 (*) 70 - 99 mg/dL   BUN 4 (*) 6 - 23 mg/dL   Creatinine, Ser 1.30 (*) 0.50 - 1.10 mg/dL   Calcium 8.9  8.4 - 86.5 mg/dL   GFR calc non Af Amer >90  >90 mL/min   GFR calc Af Amer >90  >90 mL/min   Comment: (NOTE)     The eGFR has been calculated using the CKD EPI equation.     This calculation has not been validated in all clinical situations.     eGFR's persistently <90 mL/min signify possible Chronic Kidney     Disease.  RAPID HIV SCREEN [facility])     Status: None   Collection Time    05/29/13 10:50 AM      Result Value Range   SUDS Rapid HIV Screen NON REACTIVE  NON REACTIVE   Comment: RESULT CALLED TO, READ BACK BY AND VERIFIED WITH:     S. RODRIGUEZ RN AT 1145 ON 10.28.14 BY SHUEA  CBC     Status: Abnormal   Collection Time    05/30/13  4:43 AM      Result Value Range   WBC 7.2  4.0 - 10.5 K/uL   RBC 1.92 (*) 3.87 - 5.11 MIL/uL   Hemoglobin 6.7 (*) 12.0 - 15.0 g/dL   Comment: REPEATED TO VERIFY     CRITICAL RESULT CALLED TO, READ BACK BY AND VERIFIED WITH:     CMERRITT RN AT 0525 ON [id number] BY DLONG   HCT 20.1 (*) 36.0 - 46.0 %   MCV 104.7 (*) 78.0 - 100.0 fL   MCH 34.9 (*) 26.0 - 34.0 pg   MCHC 33.3   30.0 - 36.0 g/dL   RDW 78.4  69.6 - 29.5 %   Platelets 253  150 - 400 K/uL  BASIC METABOLIC PANEL     Status: Abnormal   Collection Time    05/30/13  4:43  AM      Result Value Range   Sodium 137  135 - 145 mEq/L   Potassium 3.7  3.5 - 5.1 mEq/L   Comment: DELTA CHECK NOTED     REPEATED TO VERIFY   Chloride 105  96 - 112 mEq/L   CO2 26  19 - 32 mEq/L   Glucose, Bld 109 (*) 70 - 99 mg/dL   BUN 6  6 - 23 mg/dL   Creatinine, Ser 1.61  0.50 - 1.10 mg/dL   Calcium 8.8  8.4 - 09.6 mg/dL   GFR calc non Af Amer >90  >90 mL/min   GFR calc Af Amer >90  >90 mL/min   Comment: (NOTE)     The eGFR has been calculated using the CKD EPI equation.     This calculation has not been validated in all clinical situations.     eGFR's persistently <90 mL/min signify possible Chronic Kidney     Disease.  PREPARE RBC (CROSSMATCH)     Status: None   Collection Time    05/30/13  5:41 AM      Result Value Range   Order Confirmation ORDER PROCESSED BY BLOOD BANK     Dg Chest 2 View  05/29/2013   CLINICAL DATA:  Left chest pain under breast  EXAM: CHEST  2 VIEW  COMPARISON:  Prior chest x-ray 05/28/2013  FINDINGS: The lungs are clear and negative for focal airspace consolidation, pulmonary edema or suspicious pulmonary nodule. The previously identified nonspecific nodular opacity in the left lung base is not well seen on the present examination and is therefore favored to have reflected the costochondral junction of the left 6th rib. No pleural effusion or pneumothorax. Cardiac and mediastinal contours are within normal limits. No acute fracture or lytic or blastic osseous lesions. Residual degenerative changes at the left coracoclavicular and acromioclavicular joints. Query history of remote prior partial surgical resection versus AC joint injury. The visualized upper abdominal bowel gas pattern is unremarkable.  IMPRESSION: No active cardiopulmonary disease.   Electronically Signed   By: Malachy Moan M.D.    On: 05/29/2013 08:22   Dg Pelvis Portable  05/28/2013   CLINICAL DATA:  Post right femoral fracture  EXAM: PORTABLE PELVIS  COMPARISON:  Portable exam 2115 hr compared to 05/27/2013  FINDINGS: Acetabular and femoral components of a right hip prosthesis are again identified.  Oblique displaced proximal right femoral diaphyseal fracture seen on previous exam has been reduced.  A lateral plate, multiple screws and multiple cerclage wires have been placed at the right femur.  Bones appear demineralized.  Degenerative changes left hip.  IMPRESSION: Post ORIF right femur.   Electronically Signed   By: Ulyses Southward M.D.   On: 05/28/2013 21:25    Post Admission Physician Evaluation: 1. Functional deficits secondary  to right periprosthetic femur fracture status post ORIF 05/28/2013. 2. Patient is admitted to receive collaborative, interdisciplinary care between the physiatrist, rehab nursing staff, and therapy team. 3. Patient's level of medical complexity and substantial therapy needs in context of that medical necessity cannot be provided at a lesser intensity of care such as a SNF. 4. Patient has experienced substantial functional loss from his/her baseline which was documented above under the "Functional History" and "Functional Status" headings.  Judging by the patient's diagnosis, physical exam, and functional history, the patient has potential for functional progress which will result in measurable gains while on inpatient rehab.  These gains will be of substantial and practical use  upon discharge  in facilitating mobility and self-care at the household level. 5. Physiatrist will provide 24 hour management of medical needs as well as oversight of the therapy plan/treatment and provide guidance as appropriate regarding the interaction of the two. 6. 24 hour rehab nursing will assist with bladder management, bowel management, safety, skin/wound care, disease management, medication administration, pain  management and patient education  and help integrate therapy concepts, techniques,education, etc. 7. PT will assess and treat for/with: pre gait, gait training, endurance , safety, equipment, neuromuscular re education.   Goals are:  supervision to modified independent . 8. OT will assess and treat for/with: ADLs, Cognitive perceptual skills, Neuromuscular re education, safety, endurance, equipment.   Goals are:  supervision to modified independent . 9. SLP will assess and treat for/with:  not applicable .  Goals are:  not applicable . 10. Case Management and Social Worker will assess and treat for psychological issues and discharge planning. 11. Team conference will be held weekly to assess progress toward goals and to determine barriers to discharge. 12. Patient will receive at least 3 hours of therapy per day at least 5 days per week. 13. ELOS:  1-2 weeks        14. Prognosis:  excellent   Medical Problem List and Plan: 1. Right periprosthetic hip fracture after fall status post ORIF 05/28/2013. Recent right total hip arthroplasty 04/10/2013 2. DVT Prophylaxis/Anticoagulation: Subcutaneous Lovenox. Monitor platelet counts and any signs of bleeding. Check vascular studies 3. Pain Management: Percocet and Robaxin as needed. Monitor with increased mobility 4. Mood/anxiety. is on Ativan 0.5 mg every 8 hours as needed. 5. Neuropsych: This patient is capable of making decisions on her own behalf. 6. Acute blood loss anemia. Patient has been transfused. Continue iron supplement. Followup CBC 7. Tobacco abuse. NicoDerm patch. Provide counseling 8.GERD.zantac  Erick Colace M.D. [facility] FAAPM&R (Sports Med, Neuromuscular Med) Diplomate Am Board of Electrodiagnostic Med Diplomate Am Board of Pain Medicine Fellow Am Board of Interventional Pain Physicians 05/30/2013

## 2013-06-01 NOTE — Progress Notes (Signed)
Subjective/Complaints: Pain an issue, but she has her pain medication and muscle relaxant on a schedule currently which is working for her. Her anxiety is better. A 12 point review of systems has been performed and if not noted above is otherwise negative.   Objective: Vital Signs: Blood pressure 110/71, pulse 83, temperature 98.3 F (36.8 C), temperature source Oral, resp. rate 18, height 5\' 3"  (1.6 m), weight 65.6 kg (144 lb 10 oz), SpO2 98.00%. No results found.  Recent Labs  05/31/13 1620 06/01/13 0500  WBC 7.7 8.1  HGB 11.0* 11.1*  HCT 31.8* 31.7*  PLT 266 302    Recent Labs  05/31/13 0458 05/31/13 1620 06/01/13 0500  NA 136  --  138  K 3.8  --  4.1  CL 102  --  102  GLUCOSE 98  --  102*  BUN 5*  --  8  CREATININE 0.52 0.60 0.55  CALCIUM 9.1  --  8.9   CBG (last 3)  No results found for this basename: GLUCAP,  in the last 72 hours  Wt Readings from Last 3 Encounters:  05/31/13 65.6 kg (144 lb 10 oz)  05/27/13 61.236 kg (135 lb)  05/27/13 61.236 kg (135 lb)    Physical Exam:  Blood pressure 118/78, pulse 86, temperature 97.8 F (36.6 C), temperature source Oral, resp. rate 18, height 5\' 3"  (1.6 m), weight 61.236 kg (135 lb), SpO2 99.00%.  Physical Exam  Constitutional: She is oriented to person, place, and time. She appears well-developed.  HENT:  Head: Normocephalic.  Eyes: EOM are normal.  Neck: Normal range of motion. Neck supple. No thyromegaly present.  Cardiovascular: Normal rate and regular rhythm.  Respiratory: Effort normal and breath sounds normal. No respiratory distress.  GI: Soft. Bowel sounds are normal. She exhibits no distension.  Musculoskeletal:  Right hip incision dressed, with minimal blood along the staple line. Initial incision well healed. RLE with 2+ edema with substantial bruising/bood along inner thigh. Appropriately tender. Able to move left hip,LE without substantial pain . Ecchymosis along the right medial thigh  Neurological:  She is alert and oriented to person, place, and time.  Patient follows full commands.UE bilaterally 4+--5/5. LLE 5/5 RLE 1+ HF, 2- KE, 4/5 ADF/APF:  No sensory deficits  Psychiatric: She has a normal mood and affect. Her behavior is normal. Judgment and thought content normal      Assessment/Plan: 1. Functional deficits secondary to right periprosthetic femur fx s/p ORIF (recent right THA 9/14) which require 3+ hours per day of interdisciplinary therapy in a comprehensive inpatient rehab setting. Physiatrist is providing close team supervision and 24 hour management of active medical problems listed below. Physiatrist and rehab team continue to assess barriers to discharge/monitor patient progress toward functional and medical goals. FIM: FIM - Bathing Bathing: 0: Activity did not occur  FIM - Upper Body Dressing/Undressing Upper body dressing/undressing: 0: Wears gown/pajamas-no public clothing FIM - Lower Body Dressing/Undressing Lower body dressing/undressing: 0: Wears gown/pajamas-no public clothing  FIM - Toileting Toileting steps completed by patient: Performs perineal hygiene (pt has gown on, moves gown out of way and in place) Toileting: 2: Max-Patient completed 1 of 3 steps  FIM - Archivist Transfers: 0-Activity did not occur  FIM - Games developer Transfer: 0: Activity did not occur (stretcher to bed with +2 staff)  FIM - Locomotion: Wheelchair Locomotion: Wheelchair: 0: Activity did not occur FIM - Locomotion: Ambulation Locomotion: Ambulation: 0: Activity did not occur  Comprehension Comprehension Mode:  Auditory Comprehension: 7-Follows complex conversation/direction: With no assist  Expression Expression Mode: Verbal Expression: 7-Expresses complex ideas: With no assist  Social Interaction Social Interaction: 7-Interacts appropriately with others - No medications needed.  Problem Solving Problem Solving: 6-Solves complex problems:  With extra time  Memory Memory: 6-More than reasonable amt of time  Medical Problem List and Plan:  1. Right periprosthetic hip fracture after fall status post ORIF 05/28/2013. 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 DVT Prophylaxis/Anticoagulation: Subcutaneous Lovenox. Monitor platelet counts and any signs of bleeding.   -dopplers negative  -ice to left groin for edema and bruising 3. Pain Management: Percocet and Robaxin as needed. Monitor with increased mobility  4. Mood/anxiety. is on Ativan 0.5 mg every 8 hours as needed.  5. Neuropsych: This patient is capable of making decisions on her own behalf.  6. Acute blood loss anemia. Patient has been transfused. Continue iron supplement. Followup hgb 11.1  7. Tobacco abuse. NicoDerm patch. Provide counseling  8.GERD.zantac  LOS (Days) 1 A FACE TO FACE EVALUATION WAS PERFORMED  Kanaan Kagawa T 06/01/2013 8:25 AM

## 2013-06-02 ENCOUNTER — Inpatient Hospital Stay (HOSPITAL_COMMUNITY): Payer: MEDICAID | Admitting: Physical Therapy

## 2013-06-02 ENCOUNTER — Inpatient Hospital Stay (HOSPITAL_COMMUNITY): Payer: MEDICAID | Admitting: Occupational Therapy

## 2013-06-02 MED ORDER — HYDROCERIN EX CREA
TOPICAL_CREAM | Freq: Every day | CUTANEOUS | Status: DC
  Administered 2013-06-02 – 2013-06-07 (×5): via TOPICAL
  Filled 2013-06-02: qty 113

## 2013-06-02 NOTE — ED Provider Notes (Signed)
Medical screening examination/treatment/procedure(s) were conducted as a shared visit with non-physician practitioner(s) and myself.  I personally evaluated the patient during the encounter.  EKG Interpretation     Ventricular Rate:  93 PR Interval:  131 QRS Duration: 77 QT Interval:  406 QTC Calculation: 505 R Axis:   71 Text Interpretation:  Sinus rhythm No ischemic changes.  No ectopy              Roney Marion, MD 06/02/13 1057

## 2013-06-02 NOTE — Progress Notes (Signed)
Physical Therapy Note  Patient Details  Name: Jaime Phillips MRN: [id number] Date of Birth: August 15, 1960 Today's Date: 06/02/2013  1000-1055 (55 minutes) individual Pain: no c/o of pain initially; 5/10 with activity / premedicated Focus of treatment: transfer training; wc mobility for general activity tolerance ; therapeutic exercises focused on AROM / strengthening bilateral LEs; sit to stand to RW; gait training Treatment: Pt in bed upon arrival ; supine to sit (bed flat) SBA with pt using UEs to assist RT LE off bed; transfer scoot to right - assist with wc setup and SBA transfer; pt requested to use bathroom with stand/turn left LE using safety rails and vcs to prevent weigh bearing RT LE; wc mobility - 120 feet X 2 SBA; therapeutic exercises x 20 in supine bilaterally- ankle pumps, heel slides, SAQs, quad sets (pt has hip flexor tightness on right in supine ); sit to stand to RW X 5 SBA NWB RT LE; gait 25 feet X 2 RW NWB RT LE SBA.    1300-1340 (40 minutes) individual Pain: 6/10 right hip/ premedicated (pt requests no additional pain meds) Focus of treatment: gait training with RW NWB RT LE - 2 steps/ curb Treatment: Pt in bed upon arrival ; supine to sit SBA ; sit to supine (bed) min assist RT LE; wc mobility 120 feet X 2 SBA to modified independent on level surfaces; pt instructed and redemonstrated the following: up /down curb with RW min assist ; up/down 2 steps backward with RW min assist  (pt able to maintain NWB RT LE) ; standing Rt hip flexion X 5 with RW support.    Codey Burling,JIM 06/02/2013, 10:31 AM

## 2013-06-02 NOTE — IPOC Note (Addendum)
Overall Plan of Care [facility]) Patient Details Name: Laisha Rau MRN: [id number] DOB: Mar 08, 1961  Admitting Diagnosis: R HIP FX  Hospital Problems: Principal Problem:   Periprosthetic fracture of hip     Functional Problem List: Nursing Bladder;Bowel;Edema;Endurance;Medication Management;Pain;Nutrition;Safety;Skin Integrity  PT Balance;Endurance;Motor;Pain;Safety  OT Balance;Edema;Endurance;Motor;Pain;Skin Integrity  SLP    TR         Basic ADL's: OT Bathing;Dressing;Toileting     Advanced  ADL's: OT Simple Meal Preparation     Transfers: PT Bed Mobility;Bed to Chair;Car;Furniture  OT Toilet;Tub/Shower     Locomotion: PT Ambulation;Wheelchair Mobility;Stairs     Additional Impairments: OT    SLP        TR      Anticipated Outcomes Item Anticipated Outcome  Self Feeding    Swallowing      Basic self-care  mod I   Toileting  Mod I    Bathroom Transfers mod I   Bowel/Bladder  continent of bowel and bladder at modified independent level  Transfers  modI all transfers RW, supervision car transfer RW  Locomotion  modI ambulation RW 30', supervision wheelchair moblity 150'  Communication     Cognition     Pain  3 or less on scale of 1-10  Safety/Judgment  supervision   Therapy Plan: PT Intensity: Minimum of 1-2 x/day ,45 to 90 minutes PT Frequency: 5 out of 7 days PT Duration Estimated Length of Stay: 7-10 days OT Intensity: Minimum of 1-2 x/day, 45 to 90 minutes OT Frequency: 5 out of 7 days OT Duration/Estimated Length of Stay: 5-7 days         Team Interventions: Nursing Interventions Patient/Family Education;Bladder Management;Bowel Management;Disease Management/Prevention;Pain Management;Medication Management;Skin Care/Wound Management;Discharge Planning;Psychosocial Support  PT interventions Ambulation/gait training;Balance/vestibular training;Community reintegration;Discharge planning;Disease management/prevention;DME/adaptive equipment  instruction;Functional mobility training;Neuromuscular re-education;Pain management;Patient/family education;Psychosocial support;Splinting/orthotics;Stair training;Therapeutic Activities;Therapeutic Exercise;UE/LE Strength taining/ROM;UE/LE Coordination activities;Visual/perceptual remediation/compensation;Wheelchair propulsion/positioning  OT Interventions Balance/vestibular training;Community reintegration;Discharge planning;DME/adaptive equipment instruction;Neuromuscular re-education;Functional mobility training;Pain management;Skin care/wound managment;Self Care/advanced ADL retraining;Patient/family education;Therapeutic Activities;UE/LE Strength taining/ROM;UE/LE Coordination activities;Therapeutic Exercise  SLP Interventions    TR Interventions    SW/CM Interventions Discharge Planning;Psychosocial Support;Patient/Family Education    Team Discharge Planning: Destination: PT-Home ,OT- Home , SLP-  Projected Follow-up: PT-Home health PT, OT-  Home health OT, SLP-  Projected Equipment Needs: PT- RW, WC, OT- Tub/shower seat, SLP-  Patient/family involved in discharge planning: PT- Patient,  OT-Patient, SLP-   MD ELOS: 7-9 days Medical Rehab Prognosis:  Excellent Assessment: The patient has been admitted for CIR therapies. The team will be addressing, functional mobility, strength, stamina, balance, safety, adaptive techniques/equipment, self-care, bowel and bladder mgt, patient and caregiver education, pain mgt, hip/ortho precautions, coping skills. Goals have been set at Cedric Fishman, MD, [facility]      See Team Conference Notes for weekly updates to the plan of care

## 2013-06-02 NOTE — Progress Notes (Addendum)
Occupational Therapy Session Note  Patient Details  Name: Jaime Phillips MRN: [id number] Date of Birth: 26-Nov-1960  Today's Date: 06/02/2013 Time: 0730-0830    Skilled Therapeutic Interventions/Progress Updates: Though patient stated, "I am afraid this left leg will give out on me again.  So you may need to bear with me because I am scared.  And this right leg really hurts," she completed bed to w/c transfer with close S and Bathing and dressing with overall Min to S.  Focus on stretching through right LE without bearing weight to wash and dress periarea and R LE.  Patient maintained R LE NWB throughout the session andHip precautions.  Patient stated concerns that she often is in pain and really wants to be sure her scheduled meds come on time.  This clinician briefly shared this info with the 3rd shift nurse who was still on the unit during this session.   Note placed in patient summary that Premedication may be helpful for this patient.     Therapy Documentation Precautions:  "Fear of Falling" Precautions Precautions: Anterior Hip;Fall Restrictions Weight Bearing Restrictions: Yes RLE Weight Bearing: Non weight bearing   Pain:8/10 right hip, aching.  RN gave meds at 7:30 this am per patient report  See FIM for current functional status  Therapy/Group: Individual Therapy  Bud Face [facility] 06/02/2013, 8:20 AM

## 2013-06-02 NOTE — Progress Notes (Signed)
Occupational Therapy Session Note  Patient Details  Name: Jaime Phillips MRN: [id number] Date of Birth: 1960-09-09  Today's Date: 06/02/2013 Time: 7829-5621 Time Calculation (min): 42 min  Skilled Therapeutic Interventions/Progress Updates: Patient c/o 9/10 constant pain in right and left hip flexors and around pelvic area.   RN alerted and brought in pain meds. She elected to complete UE exercises in bed while her significant other asked for patient education regarding self care and bed and w/c positioniong.    Therapy Documentation Precautions:  Precautions Precautions: Anterior Hip;Fall Restrictions Weight Bearing Restrictions: Yes RLE Weight Bearing: Non weight bearing  Pain: Pain Assessment Pain Score: 2  Pain Type: Surgical pain Pain Descriptors / Indicators: Aching Pain Intervention(s): Medication (See eMAR) See FIM for current functional status  Therapy/Group: Individual Therapy  Bud Face [facility] 06/02/2013, 4:18 PM

## 2013-06-02 NOTE — Progress Notes (Signed)
Subjective/Complaints: Pain better as a whole. Concerned about left leg giving out at times  A 12 point review of systems has been performed and if not noted above is otherwise negative.   Objective: Vital Signs: Blood pressure 118/73, pulse 79, temperature 98.3 F (36.8 C), temperature source Oral, resp. rate 16, height 5\' 3"  (1.6 m), weight 65.6 kg (144 lb 10 oz), SpO2 96.00%. No results found.  Recent Labs  05/31/13 1620 06/01/13 0500  WBC 7.7 8.1  HGB 11.0* 11.1*  HCT 31.8* 31.7*  PLT 266 302    Recent Labs  05/31/13 0458 05/31/13 1620 06/01/13 0500  NA 136  --  138  K 3.8  --  4.1  CL 102  --  102  GLUCOSE 98  --  102*  BUN 5*  --  8  CREATININE 0.52 0.60 0.55  CALCIUM 9.1  --  8.9   CBG (last 3)  No results found for this basename: GLUCAP,  in the last 72 hours  Wt Readings from Last 3 Encounters:  05/31/13 65.6 kg (144 lb 10 oz)  05/27/13 61.236 kg (135 lb)  05/27/13 61.236 kg (135 lb)    Physical Exam:  Blood pressure 118/78, pulse 86, temperature 97.8 F (36.6 C), temperature source Oral, resp. rate 18, height 5\' 3"  (1.6 m), weight 61.236 kg (135 lb), SpO2 99.00%.  Physical Exam  Constitutional: She is oriented to person, place, and time. She appears well-developed.  HENT:  Head: Normocephalic.  Eyes: EOM are normal.  Neck: Normal range of motion. Neck supple. No thyromegaly present.  Cardiovascular: Normal rate and regular rhythm.  Respiratory: Effort normal and breath sounds normal. No respiratory distress.  GI: Soft. Bowel sounds are normal. She exhibits no distension.  Musculoskeletal:  Right hip incision dressed and intact with minimal drainage. Initial incision well healed. RLE with 2+ edema with substantial bruising/bood along inner thigh. Appropriately tender. Able to move left hip,LE without substantial pain . Ecchymosis along the right medial thigh  Neurological: She is alert and oriented to person, place, and time.  Patient follows full  commands.UE bilaterally 4+--5/5. LLE 5/5 RLE 1+ HF, 2- KE, 4/5 ADF/APF:  No sensory deficits  Psychiatric: She has a normal mood and affect. Her behavior is normal. Judgment and thought content normal      Assessment/Plan: 1. Functional deficits secondary to right periprosthetic femur fx s/p ORIF (recent right THA 9/14) which require 3+ hours per day of interdisciplinary therapy in a comprehensive inpatient rehab setting. Physiatrist is providing close team supervision and 24 hour management of active medical problems listed below. Physiatrist and rehab team continue to assess barriers to discharge/monitor patient progress toward functional and medical goals. FIM: FIM - Bathing Bathing Steps Patient Completed: Chest;Right Arm;Left Arm;Abdomen;Left upper leg;Right upper leg;Buttocks;Front perineal area Bathing: 4: Min-Patient completes 8-9 72f 10 parts or 75+ percent  FIM - Upper Body Dressing/Undressing Upper body dressing/undressing steps patient completed: Thread/unthread right sleeve of pullover shirt/dresss;Thread/unthread left sleeve of pullover shirt/dress;Put head through opening of pull over shirt/dress;Pull shirt over trunk Upper body dressing/undressing: 5: Set-up assist to: Obtain clothing/put away FIM - Lower Body Dressing/Undressing Lower body dressing/undressing steps patient completed: Thread/unthread right underwear leg;Thread/unthread left underwear leg;Thread/unthread right pants leg;Thread/unthread left pants leg Lower body dressing/undressing: 3: Mod-Patient completed 50-74% of tasks  FIM - Toileting Toileting steps completed by patient: Adjust clothing prior to toileting;Performs perineal hygiene;Adjust clothing after toileting Toileting Assistive Devices: Grab bar or rail for support Toileting: 4: Steadying assist  FIM -  Archivist Transfers: 0-Activity did not occur  FIM - Banker Devices: Bed rails;Arm  rests;HOB elevated;Sautter Bed/Chair Transfer: 0: Activity did not occur  FIM - Locomotion: Wheelchair Distance: 100' Locomotion: Wheelchair: 0: Activity did not occur FIM - Locomotion: Ambulation Locomotion: Ambulation Assistive Devices: Designer, industrial/product Ambulation/Gait Assistance: 3: Mod assist Locomotion: Ambulation: 0: Activity did not occur  Comprehension Comprehension Mode: Auditory Comprehension: 7-Follows complex conversation/direction: With no assist  Expression Expression Mode: Verbal Expression: 7-Expresses complex ideas: With no assist  Social Interaction Social Interaction: 7-Interacts appropriately with others - No medications needed.  Problem Solving Problem Solving: 6-Solves complex problems: With extra time  Memory Memory: 6-More than reasonable amt of time  Medical Problem List and Plan:  1. Right periprosthetic hip fracture after fall status post ORIF 05/28/2013. 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 DVT Prophylaxis/Anticoagulation: Subcutaneous Lovenox. Monitor platelet counts and any signs of bleeding.   -dopplers negative  -ice to left groin for edema and bruising 3. Pain Management: Percocet and Robaxin as needed. Monitor with increased mobility  4. Mood/anxiety. is on Ativan 0.5 mg every 8 hours as needed.  5. Neuropsych: This patient is capable of making decisions on her own behalf.  6. Acute blood loss anemia. Patient has been transfused. Continue iron supplement. Followup hgb 11.1  7. Tobacco abuse. NicoDerm patch. Provide counseling  8.GERD.zantac  LOS (Days) 2 A FACE TO FACE EVALUATION WAS PERFORMED  Daphyne Miguez T 06/02/2013 7:27 AM

## 2013-06-03 ENCOUNTER — Inpatient Hospital Stay (HOSPITAL_COMMUNITY): Payer: MEDICAID | Admitting: Occupational Therapy

## 2013-06-03 MED ORDER — OXYCODONE-ACETAMINOPHEN 5-325 MG PO TABS
1.0000 | ORAL_TABLET | Freq: Two times a day (BID) | ORAL | Status: DC
  Administered 2013-06-03 – 2013-06-07 (×9): 1 via ORAL
  Filled 2013-06-03 (×9): qty 1

## 2013-06-03 NOTE — Progress Notes (Signed)
Occupational Therapy Session Note  Patient Details  Name: Jaime Phillips MRN: [id number] Date of Birth: Jun 30, 1961  Today's Date: 06/03/2013 Time: 4696-2952 Time Calculation (min): 60 min  Skilled Therapeutic Interventions/Progress Updates: ADL in room shower and toileting with focus on safe independence while maintaining R LE NWB status.  Patient able to do so and with no complaints of feeling that her left leg would give out today.   Therapy Documentation Precautions:  Precautions Precautions: Anterior Hip;Fall Restrictions Weight Bearing Restrictions: No RLE Weight Bearing: Non weight bearing Pain: no reports of pain during therapy today   See FIM for current functional status  Therapy/Group: Individual Therapy  Jaime Phillips 06/03/2013, 1:48 PM

## 2013-06-03 NOTE — Progress Notes (Signed)
Subjective/Complaints: Had a reasonable night. Concerned that she's not always getting her pain medication when she requests it A 12 point review of systems has been performed and if not noted above is otherwise negative.   Objective: Vital Signs: Blood pressure 123/76, pulse 87, temperature 98.3 F (36.8 C), temperature source Oral, resp. rate 19, height 5\' 3"  (1.6 m), weight 65.6 kg (144 lb 10 oz), SpO2 95.00%. No results found.  Recent Labs  05/31/13 1620 06/01/13 0500  WBC 7.7 8.1  HGB 11.0* 11.1*  HCT 31.8* 31.7*  PLT 266 302    Recent Labs  05/31/13 1620 06/01/13 0500  NA  --  138  K  --  4.1  CL  --  102  GLUCOSE  --  102*  BUN  --  8  CREATININE 0.60 0.55  CALCIUM  --  8.9   CBG (last 3)  No results found for this basename: GLUCAP,  in the last 72 hours  Wt Readings from Last 3 Encounters:  05/31/13 65.6 kg (144 lb 10 oz)  05/27/13 61.236 kg (135 lb)  05/27/13 61.236 kg (135 lb)    Physical Exam:  Blood pressure 118/78, pulse 86, temperature 97.8 F (36.6 C), temperature source Oral, resp. rate 18, height 5\' 3"  (1.6 m), weight 61.236 kg (135 lb), SpO2 99.00%.  Physical Exam  Constitutional: She is oriented to person, place, and time. She appears well-developed.  HENT:  Head: Normocephalic.  Eyes: EOM are normal.  Neck: Normal range of motion. Neck supple. No thyromegaly present.  Cardiovascular: Normal rate and regular rhythm.  Respiratory: Effort normal and breath sounds normal. No respiratory distress.  GI: Soft. Bowel sounds are normal. She exhibits no distension.  Musculoskeletal:  Right hip incision dressed and intact with slight drainage. Initial incision well healed. RLE with 2+ edema with substantial bruising/bood along inner thigh. Appropriately tender. Able to move left hip,LE without substantial pain . Ecchymosis along the right medial thigh  Neurological: She is alert and oriented to person, place, and time.  Patient follows full  commands.UE bilaterally 4+--5/5. LLE 5/5 RLE 1+ HF, 2- KE, 4/5 ADF/APF:  No sensory deficits  Psychiatric: She has a normal mood and affect. Her behavior is normal. Judgment and thought content normal      Assessment/Plan: 1. Functional deficits secondary to right periprosthetic femur fx s/p ORIF (recent right THA 9/14) which require 3+ hours per day of interdisciplinary therapy in a comprehensive inpatient rehab setting. Physiatrist is providing close team supervision and 24 hour management of active medical problems listed below. Physiatrist and rehab team continue to assess barriers to discharge/monitor patient progress toward functional and medical goals. FIM: FIM - Bathing Bathing Steps Patient Completed: Chest;Right Arm;Left Arm;Abdomen;Front perineal area;Buttocks;Right upper leg;Left upper leg;Right lower leg (including foot);Left lower leg (including foot) (able to wash R foot by wetting middle of towel/wash cloth on end of reacher) Bathing: 5: Supervision: Safety issues/verbal cues  FIM - Upper Body Dressing/Undressing Upper body dressing/undressing steps patient completed: Thread/unthread right sleeve of pullover shirt/dresss;Thread/unthread left sleeve of pullover shirt/dress;Put head through opening of pull over shirt/dress;Pull shirt over trunk Upper body dressing/undressing: 5: Set-up assist to: Obtain clothing/put away FIM - Lower Body Dressing/Undressing Lower body dressing/undressing steps patient completed: Thread/unthread left pants leg;Pull pants up/down;Fasten/unfasten pants;Don/Doff right sock;Don/Doff left sock (did not wear panties today) Lower body dressing/undressing: 4: Min-Patient completed 75 plus % of tasks  FIM - Toileting Toileting steps completed by patient: Adjust clothing prior to toileting;Performs perineal hygiene;Adjust clothing  after toileting Toileting Assistive Devices: Grab bar or rail for support Toileting: 4: Steadying assist  FIM - Transport planner Transfers: 0-Activity did not occur  FIM - Banker Devices: Bed rails;Arm rests;HOB elevated;Duling Bed/Chair Transfer: 7: Supine > Sit: No assist;5: Bed > Chair or W/C: Supervision (verbal cues/safety issues)  FIM - Locomotion: Wheelchair Distance: 100' Locomotion: Wheelchair: 0: Activity did not occur FIM - Locomotion: Ambulation Locomotion: Ambulation Assistive Devices: Designer, industrial/product Ambulation/Gait Assistance: 3: Mod assist Locomotion: Ambulation: 0: Activity did not occur  Comprehension Comprehension Mode: Auditory Comprehension: 7-Follows complex conversation/direction: With no assist  Expression Expression Mode: Verbal Expression: 7-Expresses complex ideas: With no assist  Social Interaction Social Interaction: 7-Interacts appropriately with others - No medications needed.  Problem Solving Problem Solving: 6-Solves complex problems: With extra time  Memory Memory: 6-More than reasonable amt of time  Medical Problem List and Plan:  1. Right periprosthetic hip fracture after fall status post ORIF 05/28/2013. 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 DVT Prophylaxis/Anticoagulation: Subcutaneous Lovenox. Monitor platelet counts and any signs of bleeding.   -dopplers negative  -ice to left groin for edema and bruising 3. Pain Management: Percocet and Robaxin as needed. Monitor with increased mobility   -added scheduled percocet at 0600 and 1200 daily 4. Mood/anxiety. is on Ativan 0.5 mg every 8 hours as needed.  5. Neuropsych: This patient is capable of making decisions on her own behalf.  6. Acute blood loss anemia. Patient has been transfused. Continue iron supplement. Followup hgb 11.1  7. Tobacco abuse. NicoDerm patch. Provide counseling  8.GERD.zantac  LOS (Days) 3 A FACE TO FACE EVALUATION WAS PERFORMED  Rayquon Uselman T 06/03/2013 7:18 AM

## 2013-06-04 ENCOUNTER — Inpatient Hospital Stay (HOSPITAL_COMMUNITY): Payer: MEDICAID

## 2013-06-04 ENCOUNTER — Inpatient Hospital Stay (HOSPITAL_COMMUNITY): Payer: Self-pay

## 2013-06-04 ENCOUNTER — Inpatient Hospital Stay (HOSPITAL_COMMUNITY): Payer: MEDICAID | Admitting: Physical Therapy

## 2013-06-04 DIAGNOSIS — Z9660 Presence of unspecified orthopedic joint implant: Secondary | ICD-10-CM

## 2013-06-04 DIAGNOSIS — T84049A Periprosthetic fracture around unspecified internal prosthetic joint, initial encounter: Secondary | ICD-10-CM

## 2013-06-04 MED ORDER — OXYCODONE HCL ER 10 MG PO T12A
10.0000 mg | EXTENDED_RELEASE_TABLET | Freq: Two times a day (BID) | ORAL | Status: DC
  Administered 2013-06-04 – 2013-06-07 (×7): 10 mg via ORAL
  Filled 2013-06-04 (×7): qty 1

## 2013-06-04 MED ORDER — NYSTATIN 100000 UNIT/GM EX POWD
Freq: Two times a day (BID) | CUTANEOUS | Status: DC
  Administered 2013-06-04 – 2013-06-07 (×7): via TOPICAL
  Filled 2013-06-04 (×2): qty 15

## 2013-06-04 NOTE — Progress Notes (Signed)
Physical Therapy Note  Patient Details  Name: Adel Neyer MRN: [id number] Date of Birth: 05/25/1961 Today's Date: 06/04/2013  8295-6213 (55 minutes) individual Pain: 7/10 right hip/ premedicated Focus of treatment: wc mobility for general activity tolerance; gait training/ endurance NWB RT LE; therapeutic exercise focused on AROM/strengthing  RT LE to improve functional mobility Treatment: Pt in wc upon arrival; wc moblity 120 feet X 2 SBA with one rest  Break ; transfers stand/turn RW SBA; sit to supine (mat) SBA with increased effort RT LE; therapeutic exercises X 20 bilaterally- ankle pumps, heel slides (using sheet to self assist RT LE) , quad sets (difficulty maintaining contraction on right), RT knee extension /flexion in sitting focusing on improving knee flexion (approximately 90% on right); push up blocks X 10 for UE strengthening; gait 37 feet, 35 feet RW SBA NWB RT LE.   1100-1140 (40 minutes) individual Pain: 5/10 RT hip/premedicated Focus of treatment: transfer training (car); gait - curb , 2 steps with RW NWB RT LE  Treatment: Pt propels wc on unit SBA to modified independent; transfer wc >< practice  car with RW SBA (anterior hip precautions) ;pt able to place Rt LE in and out of car using UEs; up /down curb with RW close SBA; up /down 2 steps backwards RW  X 2 min assist + vcs for technique.    Helaman Mecca,JIM 06/04/2013, 9:48 AM

## 2013-06-04 NOTE — Progress Notes (Signed)
Occupational Therapy Session Note  Patient Details  Name: Jaime Phillips MRN: [id number] Date of Birth: [birth year]  Today's Date: 06/04/2013 Time: 0730-0823 Time Calculation (min): 53 min  Short Term Goals: Week 1:  OT Short Term Goal 1 (Week 1): STG=LTG  Skilled Therapeutic Interventions/Progress Updates:    Session 1: Therapy sessions focused on sit<>stand, standing balance/tolerance, and carryover of precautions during ADL retraining. Pt received sitting edge of bed and unable to recall anterior hip precautions however able to remember NWB in RLE. Completed stand pivot transfer bed>w/c with supervision. Pt requesting sponge bath this AM and wash UB only. Setup assist for items then pt required min assist for standing balance secondary to L knee "giving out." Min verbal cues to follow precautions during ADL task. Competed dressing and pt wearing hospital gown and robe. Utilized sock-aid with min verbal cues to don B socks. At end of session, therapist provided handout of precautions then discussed. Provided visual demonstration as well as discussing ADL activities where she needs to be extra careful of following precautions. Pt more focused on eating breakfast. Therapy session ended 7 min early so pt could eat breakfast.   Session 2: Therapy session focused on recall/carryover of precautions, functional transfers, and safety awareness. Pt received supine in bed and initially having difficulty coming supine>sit secondary to pain and fear of causing pain. Completed stand pivot transfer bed>w/c with steadying assist using RW. Pt verbalized 75% of precautions. Pt resistant to safety cues for toilet transfer from therapist so therapist allowed for planned failure to increase safety awareness. Pt completed stand pivot transfer via her technique with min assist w/c>toilet. Therapist provided education and visual demonstration of safer technique and pt willing to trial. Completed stand pivot transfer  toilet>w/c and w/c<>toilet with supervision following therapists technique. Completed tub transfer using RW and TTB. Therapist provided verbal instructions and visual demonstration. Pt completed with supervision and min verbal cues. Required min verbal cues to follow precautions during transfer. Pt unsure of width of bathroom and if accessible. Encouraged to have friend get measurements of width of bathroom and space around toilet and tub shower. Pt agreeable. At end of session pt left sitting in w/c with all needs in reach.   Therapy Documentation Precautions:  Precautions Precautions: Anterior Hip;Fall Restrictions Weight Bearing Restrictions: Yes RLE Weight Bearing: Non weight bearing General: General Amount of Missed OT Time (min): 7 Minutes Vital Signs: Therapy Vitals Temp: 98.1 F (36.7 C) Temp src: Oral Pulse Rate: 83 Resp: 18 BP: 125/83 mmHg Patient Position, if appropriate: Sitting Oxygen Therapy SpO2: 97 % O2 Device: None (Room air) Pain: Session 1: Pt reporting 8/10 pain at incision. Reported she received pain meds "late." Session 2: Pt reporting 9/10 pain at incision however did not appear to have this level of pain during functional activity. Pt reported RN had provided pain meds.   See FIM for current functional status  Therapy/Group: Individual Therapy  Daneil Dan 06/04/2013, 8:27 AM

## 2013-06-04 NOTE — Progress Notes (Signed)
Subjective/Complaints: Pain in Right hip, "got behind" on pain meds, wakes up at night A 12 point review of systems has been performed and if not noted above is otherwise negative.   Objective: Vital Signs: Blood pressure 125/83, pulse 83, temperature 98.1 F (36.7 C), temperature source Oral, resp. rate 18, height 5\' 3"  (1.6 m), weight 65.6 kg (144 lb 10 oz), SpO2 97.00%. No results found. No results found for this basename: WBC, HGB, HCT, PLT,  in the last 72 hours No results found for this basename: NA, K, CL, CO, GLUCOSE, BUN, CREATININE, CALCIUM,  in the last 72 hours CBG (last 3)  No results found for this basename: GLUCAP,  in the last 72 hours  Wt Readings from Last 3 Encounters:  05/31/13 65.6 kg (144 lb 10 oz)  05/27/13 61.236 kg (135 lb)  05/27/13 61.236 kg (135 lb)    Physical Exam:  Blood pressure 118/78, pulse 86, temperature 97.8 F (36.6 C), temperature source Oral, resp. rate 18, height 5\' 3"  (1.6 m), weight 61.236 kg (135 lb), SpO2 99.00%.  Physical Exam  Constitutional: She is oriented to person, place, and time. She appears well-developed.  HENT:  Head: Normocephalic.  Eyes: EOM are normal.  Neck: Normal range of motion. Neck supple. No thyromegaly present.  Cardiovascular: Normal rate and regular rhythm.  Respiratory: Effort normal and breath sounds normal. No respiratory distress.  GI: Soft. Bowel sounds are normal. She exhibits no distension.  Musculoskeletal:  Right hip incision dressed and intact with slight drainage. Initial incision well healed. RLE with 2+ edema with substantial bruising/bood along inner thigh. Appropriately tender. Able to move left hip,LE without substantial pain . Ecchymosis along the right medial thigh  Neurological: She is alert and oriented to person, place, and time.  Patient follows full commands.UE bilaterally 4+--5/5. LLE 5/5 RLE 1+ HF, 2- KE, 4/5 ADF/APF:  No sensory deficits  Psychiatric: She has a normal mood and affect.  Her behavior is normal. Judgment and thought content normal      Assessment/Plan: 1. Functional deficits secondary to right periprosthetic femur fx s/p ORIF (recent right THA 9/14) which require 3+ hours per day of interdisciplinary therapy in a comprehensive inpatient rehab setting. Physiatrist is providing close team supervision and 24 hour management of active medical problems listed below. Physiatrist and rehab team continue to assess barriers to discharge/monitor patient progress toward functional and medical goals. FIM: FIM - Bathing Bathing Steps Patient Completed: Chest;Right Arm;Left Arm;Left lower leg (including foot);Abdomen;Front perineal area;Buttocks;Right upper leg;Left upper leg;Right lower leg (including foot) Bathing: 5: Supervision: Safety issues/verbal cues  FIM - Upper Body Dressing/Undressing Upper body dressing/undressing steps patient completed: Thread/unthread right sleeve of pullover shirt/dresss;Pull shirt over trunk;Thread/unthread left sleeve of pullover shirt/dress;Put head through opening of pull over shirt/dress Upper body dressing/undressing: 5: Supervision: Safety issues/verbal cues FIM - Lower Body Dressing/Undressing Lower body dressing/undressing steps patient completed: Thread/unthread right underwear leg;Pull pants up/down;Thread/unthread left underwear leg;Fasten/unfasten pants;Pull underwear up/down;Thread/unthread left pants leg Lower body dressing/undressing: 4: Min-Patient completed 75 plus % of tasks  FIM - Toileting Toileting steps completed by patient: Adjust clothing prior to toileting;Performs perineal hygiene;Adjust clothing after toileting Toileting Assistive Devices: Grab bar or rail for support Toileting: 4: Steadying assist  FIM - Diplomatic Services operational officer Devices: Elevated toilet seat Toilet Transfers: 6-More than reasonable amt of time  FIM - Banker Devices: Bed rails;Arm  rests;HOB elevated;[place] Bed/Chair Transfer: 5: Bed > Chair or W/C: Supervision (verbal cues/safety issues)  FIM - Locomotion: Wheelchair Distance: 100' Locomotion: Wheelchair: 0: Activity did not occur FIM - Locomotion: Ambulation Locomotion: Ambulation Assistive Devices: Designer, industrial/product Ambulation/Gait Assistance: 3: Mod assist Locomotion: Ambulation: 0: Activity did not occur  Comprehension Comprehension Mode: Auditory Comprehension: 7-Follows complex conversation/direction: With no assist  Expression Expression Mode: Verbal Expression: 7-Expresses complex ideas: With no assist  Social Interaction Social Interaction: 7-Interacts appropriately with others - No medications needed.  Problem Solving Problem Solving: 6-Solves complex problems: With extra time  Memory Memory: 6-More than reasonable amt of time  Medical Problem List and Plan:  1. Right periprosthetic hip fracture after fall status post ORIF 05/28/2013. 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 DVT Prophylaxis/Anticoagulation: Subcutaneous Lovenox. Monitor platelet counts and any signs of bleeding.   -dopplers negative  -ice to left groin for edema and bruising 3. Pain Management: Percocet and Robaxin as needed. Monitor with increased mobility, add oxycontin  -added scheduled percocet at 0600 and 1200 daily 4. Mood/anxiety. is on Ativan 0.5 mg every 8 hours as needed.  5. Neuropsych: This patient is capable of making decisions on her own behalf.  6. Acute blood loss anemia. Patient has been transfused. Continue iron supplement. Followup hgb 11.1  7. Tobacco abuse. NicoDerm patch. Provide counseling  8.GERD.zantac  LOS (Days) 4 A FACE TO FACE EVALUATION WAS PERFORMED  Erick Colace 06/04/2013 7:36 AM

## 2013-06-04 NOTE — Plan of Care (Signed)
Problem: RH SKIN INTEGRITY Goal: RH STG ABLE TO PERFORM INCISION/WOUND CARE W/ASSISTANCE STG Able To Perform Incision/Wound Care With [employer].  Outcome: Not Progressing Total

## 2013-06-05 ENCOUNTER — Inpatient Hospital Stay (HOSPITAL_COMMUNITY): Payer: MEDICAID | Admitting: *Deleted

## 2013-06-05 ENCOUNTER — Inpatient Hospital Stay (HOSPITAL_COMMUNITY): Payer: Self-pay

## 2013-06-05 ENCOUNTER — Encounter (HOSPITAL_COMMUNITY): Payer: Self-pay

## 2013-06-05 DIAGNOSIS — Z9660 Presence of unspecified orthopedic joint implant: Secondary | ICD-10-CM

## 2013-06-05 DIAGNOSIS — T84049A Periprosthetic fracture around unspecified internal prosthetic joint, initial encounter: Secondary | ICD-10-CM

## 2013-06-05 NOTE — Progress Notes (Signed)
Physical Therapy Session Note  Patient Details  Name: Jaime Phillips MRN: [id number] Date of Birth: [birth year]  Today's Date: 06/05/2013 Time: 0830-0927 Time Calculation (min): 57 min  Short Term Goals: Week 1:  PT Short Term Goal 1 (Week 1): STGs=LTGs  Skilled Therapeutic Interventions/Progress Updates:    Patient received sitting in wheelchair. Session focused on wheelchair mobility, stair negotiation, gait training (in controlled and home environments), and functional transfers; see details below. In ADL apartment, patient performed bed mobility on regular, flat bed without bed rails and performs supine<>sit with mod I. Patient also performed furniture transfer, sit<>stand from low, cushioned couch and low bed with supervision, required verbal cues to maintain NWB on R LE. Patient reports her bed and furniture is higher than those in the ADL apartment. Patient able to perform all stand pivot transfers with RW and squat pivot transfers with B UE on arm rests with supervision throughout session.   Discussion with patient about discharge recommendations: always having someone with her for stair negotiation (supervision level), using RW in bathroom and using it to side step through doorway if doorway is not wide enough to negotiate with RW regularly (practiced side stepping), and energy conservation techniques (using wheelchair in home for days she is more fatigued instead of ambulation). Patient verbalized understanding and in agreement with recommendations, no further questions at this time. Patient returned to room and left supine in bed with all needs within reach and bed alarm on.  Therapy Documentation Precautions:  Precautions Precautions: Anterior Hip;Fall Restrictions Weight Bearing Restrictions: Yes RLE Weight Bearing: Non weight bearing Pain: Pain Assessment Pain Assessment: 0-10 Pain Score: 7  Pain Type: Surgical pain Pain Location: Hip Pain Orientation: Right Pain  Descriptors / Indicators: Aching Pain Frequency: Constant Pain Onset: On-going Locomotion : Ambulation Ambulation: Yes Ambulation/Gait Assistance: 5: Supervision Ambulation Distance (Feet): 56 Feet Assistive device: Rolling Jaime Phillips Ambulation/Gait Assistance Details: Verbal cues for precautions/safety;Verbal cues for gait pattern;Verbal cues for safe use of DME/AE Ambulation/Gait Assistance Details: Patient instructed in gait training 9' x1 in controlled environment with RW and close supervision, 68' x1 in home environment (on carpet in confined spaces requiring obstacle negotiation) with RW and supervision. Gait Gait: Yes Gait Pattern: Impaired Gait Pattern: Step-to pattern;Trunk flexed High Level Ambulation High Level Ambulation: Side stepping Side Stepping: x10' in each direction with RW and supervision, no overt LOB Stairs / Additional Locomotion Stairs: Yes Stairs Assistance: 4: Min assist Stairs Assistance Details: Visual cues/gestures for sequencing;Verbal cues for sequencing;Verbal cues for technique;Verbal cues for precautions/safety;Visual cues for safe use of DME/AE;Verbal cues for safe use of DME/AE Stairs Assistance Details (indicate cue type and reason): Patient negotiated 2 stairs, ascends backwards, descends forwards with RW and step-to pattern to maintain NWB R LE. Patient requires stabilization of RW when ascending backwards. Stair Management Technique: No rails;With Jaime Phillips;Step to pattern;Backwards;Forwards Number of Stairs: 2 Height of Stairs: 7 Corporate treasurer: Yes Wheelchair Assistance: 5: Financial planner Details: Verbal cues for Engineer, drilling: Both upper extremities Wheelchair Parts Management: Needs assistance Distance: 155   See FIM for current functional status  Therapy/Group: Individual Therapy  Chipper Herb. Jaime Phillips, PT, DPT 06/05/2013, 9:46 AM

## 2013-06-05 NOTE — Progress Notes (Signed)
Physical Therapy Session Note  Patient Details  Name: Jaime Phillips MRN: [id number] Date of Birth: 04-Dec-1960  Today's Date: 06/05/2013 Time: 1402-1500 Time Calculation (min): 58 min  Short Term Goals: Week 1:  PT Short Term Goal 1 (Week 1): STGs=LTGs  Skilled Therapeutic Interventions/Progress Updates:    Pt received seated in w/c in pt room. Pt agreeable to session but reports pain in contralateral hip, which she attributes to having used LLE more than she typically would. Pt able to verbalize 2 of 3 anterior hip precautions without cueing.  Session focused on improving safety and independence with functional transfers, ambulation, stair negotiation, and w/c management. Therapist verbally explained and demonstrated management of w/c parts during initial 5 minutes of session. For remainder of session, pt verbalized understanding and gave effective return demonstration without cueing. Self-propulsion of w/c with bilat UE's x 165' total with supervision. Gait x42', x53' with rolling [place], 2-point gait pattern (adhering to RLE NWB) requiring supervision. Negotiation of 2 stairs without rails using rolling Bosher (ascended backward, descended forward-facing) with supervision.  Stand-pivot transfer from w/c<>car with rolling [place] requiring supervision. Stand-pivot from w/c<>mat table with rolling Casciano and supervision. Graded sit<>stand transfers x3 from progressively lower mat table to rolling Leary with verbal cueing for setup and body mechanics during initial transfer; pt gave effective return demonstration of cues and performed all transfers with supervision. Session ended in pt room, where pt was seated in w/c with all needs met and call bell within reach.  Therapy Documentation Precautions:  Precautions Precautions: Anterior Hip;Fall Restrictions Weight Bearing Restrictions: Yes RLE Weight Bearing: Non weight bearing Vital Signs: Therapy Vitals Temp: 98.1 F (36.7 C) Temp src:  Oral Pulse Rate: 86 Resp: 18 BP: 122/78 mmHg Patient Position, if appropriate: Sitting Oxygen Therapy SpO2: 98 % O2 Device: None (Room air) Pain: Pain Assessment Pain Assessment: 0-10 Pain Score: 8  Pain Type: Surgical pain Pain Location: Hip Pain Orientation: Left Pain Descriptors / Indicators: Sore Pain Onset: On-going Patients Stated Pain Goal: 0 Pain Intervention(s): RN made aware;Elevated extremity Multiple Pain Sites: No Locomotion : Ambulation Ambulation/Gait Assistance: 5: Supervision   See FIM for current functional status  Therapy/Group: Individual Therapy  Calvert Cantor 06/05/2013, 5:42 PM

## 2013-06-05 NOTE — Progress Notes (Signed)
Subjective/Complaints: Slept well last night, bowels are moving, no bladder issues.  Wants to take shower A 12 point review of systems has been performed and if not noted above is otherwise negative.   Objective: Vital Signs: Blood pressure 107/73, pulse 74, temperature 97.8 F (36.6 C), temperature source Oral, resp. rate 18, height 5\' 3"  (1.6 m), weight 65.6 kg (144 lb 10 oz), SpO2 100.00%. No results found. No results found for this basename: WBC, HGB, HCT, PLT,  in the last 72 hours No results found for this basename: NA, K, CL, CO, GLUCOSE, BUN, CREATININE, CALCIUM,  in the last 72 hours CBG (last 3)  No results found for this basename: GLUCAP,  in the last 72 hours  Wt Readings from Last 3 Encounters:  05/31/13 65.6 kg (144 lb 10 oz)  05/27/13 61.236 kg (135 lb)  05/27/13 61.236 kg (135 lb)    Physical Exam:  Blood pressure 118/78, pulse 86, temperature 97.8 F (36.6 C), temperature source Oral, resp. rate 18, height 5\' 3"  (1.6 m), weight 61.236 kg (135 lb), SpO2 99.00%.  Physical Exam  Constitutional: She is oriented to person, place, and time. She appears well-developed.  HENT:  Head: Normocephalic.  Eyes: EOM are normal.  Neck: Normal range of motion. Neck supple. No thyromegaly present.  Cardiovascular: Normal rate and regular rhythm.  Respiratory: Effort normal and breath sounds normal. No respiratory distress.  GI: Soft. Bowel sounds are normal. She exhibits no distension.  Musculoskeletal:  Right lateral hip incision dressed and intact with no drainage. Anterior incision well healed. RLE with 2+ edema with minimal bruising/bood along inner thigh. Appropriately tender. Able to move left hip,LE without substantial pain . Ecchymosis along the right medial thigh  Neurological: She is alert and oriented to person, place, and time.  Patient follows full commands.UE bilaterally 4+--5/5. LLE 5/5 RLE 1+ HF, 2- KE, 4/5 ADF/APF:  No sensory deficits  Psychiatric: She has a  normal mood and affect. Her behavior is normal. Judgment and thought content normal      Assessment/Plan: 1. Functional deficits secondary to right periprosthetic femur fx s/p ORIF (recent right ant THA 9/14) which require 3+ hours per day of interdisciplinary therapy in a comprehensive inpatient rehab setting. Physiatrist is providing close team supervision and 24 hour management of active medical problems listed below. Physiatrist and rehab team continue to assess barriers to discharge/monitor patient progress toward functional and medical goals. FIM: FIM - Bathing Bathing Steps Patient Completed: Chest;Right Arm;Left Arm;Abdomen;Front perineal area;Buttocks;Right upper leg;Left upper leg Bathing: 4: Steadying assist (declined washing feet)  FIM - Upper Body Dressing/Undressing Upper body dressing/undressing steps patient completed: Thread/unthread right sleeve of front closure shirt/dress;Thread/unthread left sleeve of front closure shirt/dress;Pull shirt around back of front closure shirt/dress;Button/unbutton shirt Upper body dressing/undressing: 5: Set-up assist to: Obtain clothing/put away FIM - Lower Body Dressing/Undressing Lower body dressing/undressing steps patient completed: Don/Doff left sock;Don/Doff right sock Lower body dressing/undressing: 5: Set-up assist to: Don/Doff AFO/prosthesis/orthosis  FIM - Toileting Toileting steps completed by patient: Adjust clothing prior to toileting;Performs perineal hygiene;Adjust clothing after toileting Toileting Assistive Devices: Grab bar or rail for support Toileting: 4: Steadying assist  FIM - Diplomatic Services operational officer Devices: Elevated toilet seat;Grab bars Toilet Transfers: 4-To toilet/BSC: Min A (steadying Pt. > 75%);5-To toilet/BSC: Supervision (verbal cues/safety issues);5-From toilet/BSC: Supervision (verbal cues/safety issues)  FIM - Bed/Chair Transfer Bed/Chair Transfer Assistive Devices: Bed rails;Arm  rests;HOB elevated;[place] Bed/Chair Transfer: 4: Supine > Sit: Min A (steadying Pt. > 75%/lift 1  leg);4: Bed > Chair or W/C: Min A (steadying Pt. > 75%)  FIM - Locomotion: Wheelchair Distance: 100' Locomotion: Wheelchair: 0: Activity did not occur FIM - Locomotion: Ambulation Locomotion: Ambulation Assistive Devices: Designer, industrial/product Ambulation/Gait Assistance: 3: Mod assist Locomotion: Ambulation: 0: Activity did not occur  Comprehension Comprehension Mode: Auditory Comprehension: 7-Follows complex conversation/direction: With no assist  Expression Expression Mode: Verbal Expression: 7-Expresses complex ideas: With no assist  Social Interaction Social Interaction: 6-Interacts appropriately with others with medication or extra time (anti-anxiety, antidepressant).  Problem Solving Problem Solving: 6-Solves complex problems: With extra time  Memory Memory: 6-More than reasonable amt of time  Medical Problem List and Plan:  1. Right periprosthetic hip fracture after fall status post ORIF 05/28/2013. 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 DVT Prophylaxis/Anticoagulation: Subcutaneous Lovenox. Monitor platelet counts and any signs of bleeding.   -dopplers negative  -ice to left groin for edema and bruising 3. Pain Management: Percocet and Robaxin as needed. Monitor with increased mobility, pain improved with oxycontin  -added scheduled percocet at 0600 and 1200 daily 4. Mood/anxiety. is on Ativan 0.5 mg every 8 hours as needed.  5. Neuropsych: This patient is capable of making decisions on her own behalf.  6. Acute blood loss anemia. Patient has been transfused. Continue iron supplement. Followup hgb 11.1  7. Tobacco abuse. NicoDerm patch. Provide counseling  8.GERD.zantac  LOS (Days) 5 A FACE TO FACE EVALUATION WAS PERFORMED  Sary Bogie E 06/05/2013 7:07 AM

## 2013-06-05 NOTE — Progress Notes (Signed)
Social Work Patient ID: Girtha Rm, female   DOB: 1961/05/18, 52 y.o.   MRN: [id number] Met with pt who reports she oing better.  Aware team is targeting discharge for Thursday.  She does need a wheelchair now due to new fracture. Pt requires a lightweight wheelchair due to she is unable to self propel a standard weight wheelchair and she also uses for her self care tasks. Will order wheelchair she has other pieces of equipment from previous admission.  Team conference tomorrow.

## 2013-06-05 NOTE — Progress Notes (Signed)
Occupational Therapy Session Note  Patient Details  Name: Jaime Phillips MRN: [id number] Date of Birth: Jun 11, 1961  Today's Date: 06/05/2013 Time: 0730-0825 and 1330-1400 Time Calculation (min): 55 min and 30 min   Short Term Goals: Week 1:  OT Short Term Goal 1 (Week 1): STG=LTG  Skilled Therapeutic Interventions/Progress Updates:    Session 1: Therapy session focused on ADL retaining, recall of precautions, standing balance, and functional transfers. Pt verbalized 75% of precautions and required min verbal cues to follow during therapy session. Pt completed stand pivot transfers bed>w/c, w/c<>TTB with supervision. Used LH sponge in shower for BLE and supervision for standing balance in shower while following NWB precautions. Completed dressing using AE for LB dressing with setup assist and supervision for standing balance. Continued to discuss precautions throughout therapy session. Pt reporting RW will not fit into bathroom and she would have to side step into bathroom to toilet. Discussed using 3-in-1 commode and pt reportng having one at home. Will practice this in PM session. Pt left sitting in w/c to finish eating breakfast at end of session.   Session 2: Therapy session focused on side-stepping to simulate home environment bathroom (not RW accessible when ambulating forward). Pt described distance from doorway to toilet (approx 8-10 feet). Completed side stepping with RW 8-10 feet with supervision then pt required long rest break. Pt then side stepped in other direction approx 8-10 feet to simulate entering and exiting bathroom. Required long rest break then completed sit<>stand x5 with supervision. Pt having increased pain in L hip as well during sit<>stand secondary to "out of place." Pt reported she needs to get the other hip replaced however waiting for R hip to heal. Completed w/c push-up 8 reps then 5 reps with long rest break between to increase BUE strength to facilitate sit<>stand. At  end of session pt left sitting in w/c with all needs in reach.   Therapy Documentation Precautions:  Precautions Precautions: Anterior Hip;Fall Restrictions Weight Bearing Restrictions: Yes RLE Weight Bearing: Non weight bearing General: General Amount of Missed OT Time (min): 5 Minutes Vital Signs: Therapy Vitals Temp: 97.8 F (36.6 C) Temp src: Oral Pulse Rate: 74 Resp: 18 BP: 107/73 mmHg Patient Position, if appropriate: Lying Oxygen Therapy SpO2: 100 % Pain: Session 1: Pt receiving pain meds at beginning of therapy session and c/o pain with movement of LLE. Session 2: Pt reporting 8/10 pain at R hip. Reported receiving pain meds prior to OT session.    Other Treatments:    See FIM for current functional status  Therapy/Group: Individual Therapy  Daneil Dan 06/05/2013, 8:31 AM

## 2013-06-05 NOTE — Plan of Care (Signed)
Problem: RH Ambulation Goal: LTG Patient will ambulate in controlled environment (PT) LTG: Patient will ambulate in a controlled environment, # of feet with assistance (PT).  Goal upgraded 06/05/13 for increased distance secondary to patient's progress. Goal: LTG Patient will ambulate in home environment (PT) LTG: Patient will ambulate in home environment, # of feet with assistance (PT).  Goal upgraded 06/05/13 for increased distance secondary to patient's progress.  Problem: RH Wheelchair Mobility Goal: LTG Patient will propel w/c in controlled environment (PT) LTG: Patient will propel wheelchair in controlled environment, # of feet with assist (PT)  Goal downgraded 06/05/13 secondary to difficulty with ramp negotiation.

## 2013-06-06 ENCOUNTER — Inpatient Hospital Stay (HOSPITAL_COMMUNITY): Payer: MEDICAID | Admitting: Physical Therapy

## 2013-06-06 ENCOUNTER — Inpatient Hospital Stay (HOSPITAL_COMMUNITY): Payer: MEDICAID | Admitting: Occupational Therapy

## 2013-06-06 ENCOUNTER — Inpatient Hospital Stay (HOSPITAL_COMMUNITY): Payer: MEDICAID

## 2013-06-06 ENCOUNTER — Inpatient Hospital Stay (HOSPITAL_COMMUNITY): Payer: MEDICAID | Admitting: *Deleted

## 2013-06-06 NOTE — Progress Notes (Signed)
Physical Therapy Note  Patient Details  Name: Addalynne Golding MRN: [id number] Date of Birth: 09/12/1960 Today's Date: 06/06/2013  1430-1455 (25 minutes) individual Pain: 6/10 RT hip/ premedicated Focus of treatment : Review written, illustrated home exercise program Treatment : Transfers SBA to modified independent RW NWB RT LE; sit to supine - pt self assist placing LEs on/off mat using UEs ; therapeutic exercises X 20- ankle pumps, heel slides, glut/quad sets, RT SLR using sheet for self assist, SAQs.    Kerby Borner,JIM 06/06/2013, 3:00 PM

## 2013-06-06 NOTE — Progress Notes (Signed)
Physical Therapy Discharge Summary  Patient Details  Name: Jaime Phillips MRN: [id number] Date of Birth: 10/16/1960  Today's Date: 06/06/2013 Time: 1330-1430 Time Calculation (min): 60 min  Patient has met 11 of 11 long term goals due to improved activity tolerance, improved balance, increased strength, decreased pain, ability to compensate for deficits, improved awareness and improved coordination.  Patient to discharge at a (household) ambulatory level Modified Independent, (community) wheelchair level supervision level.  Patient's care partner not necessary secondary to patient discharging at mod I level to provide the necessary assistance at discharge.  Reasons goals not met: N/A, all LTGs met  Recommendation:  Patient will benefit from ongoing skilled PT services in home health setting to continue to advance safe functional mobility, address ongoing impairments in strength, balance, coordination, activity tolerance, gait, and minimize fall risk.  Equipment: 18x18 wheelchair with R elevating leg rest and basic cushion  Reasons for discharge: treatment goals met and discharge from hospital  Patient/family agrees with progress made and goals achieved: Yes  Skilled Interventions: Patient received sitting in wheelchair. Session focused on functional mobility in preparation for discharge home; see details below. Patient's husband present for session. Family education provided for patient and husband for all functional mobility, education provided on recommendations for assistance levels of supervision/set up assist for negotiation of stairs and for car transfers. Patient's husband participated in therapy session and provided supervision level assistance for car transfer with RW and negotiation of 2 stairs (husband stabilizes RW when patient is ascending steps backwards). Made patient mod I in room, explained implications of mod I in room to patient and encouraged her to call for assistance if  necessary. Patient verbalized understanding. Provided home exercise program handout and handout for anterior hip precautions and reviewed with patient. Patient and husband have no further questions at this time.  PT Discharge Precautions/Restrictions Precautions Precautions: Anterior Hip Restrictions Weight Bearing Restrictions: Yes RLE Weight Bearing: Non weight bearing Pain Pain Assessment Pain Assessment: 0-10 Pain Score: 7  Pain Type: Surgical pain Pain Location: Hip Pain Orientation: Right Pain Descriptors / Indicators: Aching Pain Onset: On-going Vision/Perception  Vision - History Baseline Vision: No visual deficits Patient Visual Report: No change from baseline  Cognition Overall Cognitive Status: Within Functional Limits for tasks assessed Arousal/Alertness: Awake/alert Orientation Level: Oriented X4 Safety/Judgment: Appears intact Sensation Sensation Light Touch: Appears Intact Proprioception: Appears Intact Coordination Gross Motor Movements are Fluid and Coordinated: No (R LE impaired movements secondary to strength deficits and pain) Fine Motor Movements are Fluid and Coordinated: Yes Motor  Motor Motor: Other (comment) Motor - Discharge Observations: generalized weakness and pain in right LE-NWB R LE, anterior hip precautions on R  Mobility Bed Mobility Bed Mobility: Sit to Supine;Supine to Sit Supine to Sit: 6: Modified independent (Device/Increase time);HOB flat Sit to Supine: 6: Modified independent (Device/Increase time);HOB flat Transfers Sit to Stand: 6: Modified independent (Device/Increase time);With armrests;From bed;From chair/3-in-1;With upper extremity assist Stand to Sit: 6: Modified independent (Device/Increase time);To chair/3-in-1;To bed;With upper extremity assist;With armrests Stand Pivot Transfers: 6: Modified independent (Device/Increase time);With armrests Locomotion  Ambulation Ambulation: Yes Ambulation/Gait Assistance: 6:  Modified independent (Device/Increase time) Ambulation Distance (Feet): 53 Feet Assistive device: Rolling Axelson Ambulation/Gait Assistance Details: Patient performed gait training 53'x1 and 25' x1 in controlled environment with RW and mod I. Gait Gait: Yes Gait Pattern: Impaired Gait Pattern: Step-to pattern;Trunk flexed Stairs / Additional Locomotion Stairs: Yes Stairs Assistance: 5: Supervision Stairs Assistance Details: Visual cues for safe use of DME/AE Stairs Assistance Details (indicate  cue type and reason): Patient negotiated 2 stairs, ascends backwards, descends forwards with RW and step to pattern to maintain NWB R LE. Stair Management Technique: No rails;With Severe;Step to pattern;Backwards;Forwards Number of Stairs: 2 Height of Stairs: 7 Curb: 5: Supervision (with RW) Corporate treasurer: Yes Wheelchair Assistance: 5: Financial planner Details: Verbal cues for Engineer, drilling: Both upper extremities Wheelchair Parts Management: Needs assistance Distance: 160  Trunk/Postural Assessment  Cervical Assessment Cervical Assessment: Within Functional Limits Thoracic Assessment Thoracic Assessment: Within Functional Limits Lumbar Assessment Lumbar Assessment: Within Functional Limits Postural Control Postural Control: Within Functional Limits  Balance Balance Balance Assessed: Yes Static Sitting Balance Static Sitting - Balance Support: No upper extremity supported;Feet supported Static Sitting - Level of Assistance: 7: Independent Dynamic Sitting Balance Dynamic Sitting - Balance Support: Feet supported Dynamic Sitting - Level of Assistance: 7: Independent Dynamic Sitting - Balance Activities: Forward lean/weight shifting Static Standing Balance Static Standing - Balance Support: Bilateral upper extremity supported;During functional activity Static Standing - Level of Assistance: 6: Modified independent  (Device/Increase time) Dynamic Standing Balance Dynamic Standing - Balance Support: Bilateral upper extremity supported;During functional activity Dynamic Standing - Level of Assistance: 6: Modified independent (Device/Increase time) Extremity Assessment  RLE Assessment RLE Assessment: Exceptions to [facility] RLE Strength RLE Overall Strength: Due to precautions;Due to pain RLE Overall Strength Comments: grossly 3/5 hip flex, knee flex/ext, DF/PF; formal MMT not performed secondary to pain LLE Assessment LLE Assessment: Within Functional Limits LLE Strength LLE Overall Strength: Within Functional Limits for tasks assessed LLE Overall Strength Comments: grossly 5/5  See FIM for current functional status  Analena Gama S Jackey Loge. Charmeka [place], PT, DPT 06/06/2013, 2:59 PM

## 2013-06-06 NOTE — Progress Notes (Signed)
Subjective/Complaints: Pain under better control.  No problems in therapy.  No dizziness with standing A 12 point review of systems has been performed and if not noted above is otherwise negative.   Objective: Vital Signs: Blood pressure 114/70, pulse 82, temperature 98.2 F (36.8 C), temperature source Oral, resp. rate 18, height 5\' 3"  (1.6 m), weight 65.6 kg (144 lb 10 oz), SpO2 96.00%. No results found. No results found for this basename: WBC, HGB, HCT, PLT,  in the last 72 hours No results found for this basename: NA, K, CL, CO, GLUCOSE, BUN, CREATININE, CALCIUM,  in the last 72 hours CBG (last 3)  No results found for this basename: GLUCAP,  in the last 72 hours  Wt Readings from Last 3 Encounters:  05/31/13 65.6 kg (144 lb 10 oz)  05/27/13 61.236 kg (135 lb)  05/27/13 61.236 kg (135 lb)    Physical Exam:  Blood pressure 118/78, pulse 86, temperature 97.8 F (36.6 C), temperature source Oral, resp. rate 18, height 5\' 3"  (1.6 m), weight 61.236 kg (135 lb), SpO2 99.00%.  Physical Exam  Constitutional: She is oriented to person, place, and time. She appears well-developed.  HENT:  Head: Normocephalic.  Eyes: EOM are normal.  Neck: Normal range of motion. Neck supple. No thyromegaly present.  Cardiovascular: Normal rate and regular rhythm.  Respiratory: Effort normal and breath sounds normal. No respiratory distress.  GI: Soft. Bowel sounds are normal. She exhibits no distension.  Musculoskeletal:  Right lateral hip incision dressed and intact with no drainage. Anterior incision well healed. RLE with 2+ edema with minimal bruising/bood along inner thigh. Appropriately tender. Able to move left hip,LE without substantial pain . Neurological: She is alert and oriented to person, place, and time.  Patient follows full commands.UE bilaterally 4+--5/5. LLE 5/5 RLE 1+ HF, 2- KE, 4/5 ADF/APF:  No sensory deficits  Psychiatric: She has a normal mood and affect. Her behavior is normal.  Judgment and thought content normal      Assessment/Plan: 1. Functional deficits secondary to right periprosthetic femur fx s/p ORIF (recent right ant THA 9/14) which require 3+ hours per day of interdisciplinary therapy in a comprehensive inpatient rehab setting. Physiatrist is providing close team supervision and 24 hour management of active medical problems listed below. Physiatrist and rehab team continue to assess barriers to discharge/monitor patient progress toward functional and medical goals. Team conference today please see physician documentation under team conference tab, met with team face-to-face to discuss problems,progress, and goals. Formulized individual treatment plan based on medical history, underlying problem and comorbidities. FIM: FIM - Bathing Bathing Steps Patient Completed: Chest;Right Arm;Left Arm;Abdomen;Front perineal area;Buttocks;Right upper leg;Left upper leg;Left lower leg (including foot);Right lower leg (including foot) Bathing: 5: Supervision: Safety issues/verbal cues  FIM - Upper Body Dressing/Undressing Upper body dressing/undressing steps patient completed: Thread/unthread right sleeve of front closure shirt/dress;Thread/unthread left sleeve of front closure shirt/dress;Pull shirt around back of front closure shirt/dress;Button/unbutton shirt;Thread/unthread right sleeve of pullover shirt/dresss;Thread/unthread left sleeve of pullover shirt/dress;Put head through opening of pull over shirt/dress;Pull shirt over trunk Upper body dressing/undressing: 5: Set-up assist to: Obtain clothing/put away FIM - Lower Body Dressing/Undressing Lower body dressing/undressing steps patient completed: Don/Doff left sock;Don/Doff right sock;Pull pants up/down;Thread/unthread right pants leg;Thread/unthread left pants leg Lower body dressing/undressing: 5: Set-up assist to: Don/Doff TED stocking  FIM - Toileting Toileting steps completed by patient: Adjust clothing prior  to toileting;Performs perineal hygiene;Adjust clothing after toileting Toileting Assistive Devices: Grab bar or rail for support Toileting: 4: Steadying  assist  FIM - Diplomatic Services operational officer Devices: Elevated toilet seat;Grab bars Toilet Transfers: 4-To toilet/BSC: Min A (steadying Pt. > 75%);5-To toilet/BSC: Supervision (verbal cues/safety issues);5-From toilet/BSC: Supervision (verbal cues/safety issues)  FIM - Banker Devices: Lucarelli;Arm rests Bed/Chair Transfer: 5: Bed > Chair or W/C: Supervision (verbal cues/safety issues);5: Chair or W/C > Bed: Supervision (verbal cues/safety issues)  FIM - Locomotion: Wheelchair Distance: 165 Locomotion: Wheelchair: 5: Travels 150 ft or more: maneuvers on rugs and over door sills with supervision, cueing or coaxing FIM - Locomotion: Ambulation Locomotion: Ambulation Assistive Devices: Designer, industrial/product Ambulation/Gait Assistance: 5: Supervision Locomotion: Ambulation: 2: Travels 50 - 149 ft with supervision/safety issues  Comprehension Comprehension Mode: Auditory Comprehension: 7-Follows complex conversation/direction: With no assist  Expression Expression Mode: Verbal Expression: 7-Expresses complex ideas: With no assist  Social Interaction Social Interaction: 6-Interacts appropriately with others with medication or extra time (anti-anxiety, antidepressant).  Problem Solving Problem Solving: 6-Solves complex problems: With extra time  Memory Memory: 6-More than reasonable amt of time  Medical Problem List and Plan:  1. Right periprosthetic hip fracture after fall status post ORIF 05/28/2013. 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 DVT Prophylaxis/Anticoagulation: Subcutaneous Lovenox. Monitor platelet counts and any signs of bleeding.   -dopplers negative  -ice to left groin for edema and bruising 3. Pain Management: Percocet and Robaxin as needed. Monitor with  increased mobility, pain improved with oxycontin  - scheduled percocet at 0600 and 1200 daily 4. Mood/anxiety. is on Ativan 0.5 mg every 8 hours as needed.  5. Neuropsych: This patient is capable of making decisions on her own behalf.  6. Acute blood loss anemia. Patient has been transfused. Continue iron supplement. Followup hgb 11.1  7. Tobacco abuse. NicoDerm patch. Provide counseling  8.GERD.zantac  LOS (Days) 6 A FACE TO FACE EVALUATION WAS PERFORMED   Erick Colace 06/06/2013 7:49 AM

## 2013-06-06 NOTE — Progress Notes (Signed)
Social Work Patient ID: Jaime Phillips, female   DOB: [birth year], 52 y.o.   MRN: [id number] Met with pt to inform of team conferecne goals-mod/i level and discharge 11/6.  Pt is very pleased with her progress and wants to do well.  She is aware of the team's concerns regarding her following her hip precautions but she will work on this.  Will have follow up therapies and Get her DME.  She wanted a tub seat added to the order.  She is already in the process of applying for SSD and has a phone interview 11/12. Will [facility] to get [facility] and get a orange card.  Work toward discharge tomorrow.

## 2013-06-06 NOTE — Progress Notes (Signed)
Occupational Therapy Session Note  Patient Details  Name: Jaime Phillips MRN: [id number] Date of Birth: October 17, 1960  Today's Date: 06/06/2013 Time: 0930-1028 Time Calculation (min): 58 min  Short Term Goals: Week 1:  OT Short Term Goal 1 (Week 1): STG=LTG  Skilled Therapeutic Interventions/Progress Updates: ADL-retraining with emphasis on adherence to NWB precautions during ADL, skilled use of assistive devices during functional mobility and ADL, simple meal prep at w/c level, and energy conservation.   Patient able to complete bed mobility unassisted w/o bedrails and ambulate to bathroom to complete toileting and bathing independent modified by use of RW, LH sponge, tub bench and BSC over toilet.   Patient required re-ed on hip precaution d/t misperception that she should not flex right hip; reviewed hip precautions again this session.   Patient consistently able to avoid weight-bearing through R-LE although c/o mild discomfort at left hip due to compensation to NWB at R-LE.   Patient completed bathing and ambulated to edge of bed to complete dressing, verbalizing her strategy to complete setup and alter clothing choices to simplify task prior to bathing/dressing each day.  Patient used reacher skillfully for lower body dressing although device was not necessary due to good flexibility at both hips enabling her to reach to her feet.    After bathing and dressing within 45 minutes, patient completed simulation of simple meal prep in ADL kitchen at w/c level.   Patient was educated on use of rolling cart and recommendations to simplify prep and clean-up.   Patient verbalized plan to use microwave meals frequently and paper plates to avoid clean-up.    Therapy Documentation Precautions:  Precautions Precautions: Anterior Hip;Fall Restrictions Weight Bearing Restrictions: Yes RLE Weight Bearing: Non weight bearing  Pain: Pain Assessment Pain Assessment: 0-10 Pain Score: 4  Pain Type: Surgical  pain Pain Location: Hip Pain Orientation: Right Pain Descriptors / Indicators: Aching Pain Frequency: Intermittent Pain Onset: Gradual Patients Stated Pain Goal: 3 Pain Intervention(s): Medication (See eMAR);Repositioned Multiple Pain Sites: No  See FIM for current functional status  Therapy/Group: Individual Therapy  Giovanne Nickolson 06/06/2013, 10:29 AM

## 2013-06-06 NOTE — Plan of Care (Signed)
Problem: RH PAIN MANAGEMENT Goal: RH STG PAIN MANAGED AT OR BELOW PT'S PAIN GOAL 3 or less on scale of 1-10  Outcome: Not Progressing Patient c/o pain 7/10

## 2013-06-06 NOTE — Patient Care Conference (Signed)
Inpatient RehabilitationTeam Conference and Plan of Care Update Date: 06/06/2013   Time: 10;35 AM    Patient Name: Jaime Phillips      Medical Record Number: [id number]  Date of Birth: 1961-02-21 Sex: Female         Room/Bed: 4M02C/4M02C-01 Payor Info: Payor: MEDICAID POTENTIAL / Plan: MEDICAID POTENTIAL / Product Type: *No Product type* /    Admitting Diagnosis: R HIP FX  Admit Date/Time:  05/31/2013  3:03 PM Admission Comments: No comment available   Primary Diagnosis:  Periprosthetic fracture of hip Principal Problem: Periprosthetic fracture of hip  Patient Active Problem List   Diagnosis Date Noted  . Periprosthetic fracture of hip 05/31/2013  . Hip fracture 05/27/2013  . Acute blood loss anemia 04/11/2013  . S/P right THA, AA 04/10/2013    Expected Discharge Date: Expected Discharge Date: 06/07/13  Team Members Present: Physician leading conference: Dr. Claudette Laws Social Worker Present: Staci Acosta, LCSW;Becky Ashlyne Olenick, LCSW Nurse Present: Gregor Hams, RN PT Present: Cyndia Skeeters, Lillie Columbia, PT OT Present: Mackie Pai, OT SLP Present: Maxcine Ham, SLP     Current Status/Progress Goal Weekly Team Focus  Medical   R hip pain, incision CDI  Maintain pain levels to allow adequate participation in rehabilitation program  Adjust pain medications, use ice   Bowel/Bladder   continent of B&B, LBM 06/05/2013  To be continent of B&B  Remain continent of B&B   Swallow/Nutrition/ Hydration     [facility]        ADL's   supervision overall, decreased recall of precautions, decreased safety awareness  Mod I overall  functional transfers, standing balance, safety awareness, pt education, strengthening, activity    Mobility   supervision all mobility  Supervision-mod I all mobility  safety, precautions, balance, transfers, gait, stair negotiation, strengthening, activity tolerance, bed mobility   Communication     [facility]        Safety/Cognition/ Behavioral  Observations    No safety concerns        Pain   C/o of pain and soreness to right hip, Sch Oxycontin ordered and percocet 5-325mg  1-2 tabs prn   pain <3  Assess and for monitor for pain, Pain level to be >3/10   Skin   Incision to right hip foam dsg in place, reddness in groin area-nystatin ordered for reddness  No skin breakdown   Decrease reddness to groin area, assess and monitor skin for breakdown       *See Care Plan and progress notes for long and short-term goals.  Barriers to Discharge: Requires physical assistance    Possible Resolutions to Barriers:  Continue rehabilitation program    Discharge Planning/Teaching Needs:  Home with female friend who is in and out.  She eneds to be mod/i to return home      Team Discussion:  Reaching goals of mod/i level, some safety concerns regarding following hip precautions, pt is aware of this. Pt ready to go home.  Revisions to Treatment Plan:  None   Continued Need for Acute Rehabilitation Level of Care: The patient requires daily medical management by a physician with specialized training in physical medicine and rehabilitation for the following conditions: Daily direction of a multidisciplinary physical rehabilitation program to ensure safe treatment while eliciting the highest outcome that is of practical value to the patient.: Yes Daily medical management of patient stability for increased activity during participation in an intensive rehabilitation regime.: Yes Daily analysis of laboratory values and/or radiology reports with any subsequent  need for medication adjustment of medical intervention for : Post surgical problems  Anahid Eskelson, Lemar Livings 06/06/2013, 1:37 PM

## 2013-06-06 NOTE — Progress Notes (Signed)
Occupational Therapy Session Note  Patient Details  Name: Jaime Phillips MRN: [id number] Date of Birth: 29-Nov-1960  Today's Date: 06/06/2013 Time: 7829-5621 Time Calculation (min): 43 min  Short Term Goals: Week 1:  OT Short Term Goal 1 (Week 1): STG=LTG  Skilled Therapeutic Interventions/Progress Updates:    Pt seen for 1:1 OT with focus on sidestepping as needed for entry/exit of bathroom.  Pt reports bathroom not accessible via w/c or while ambulating forwards with RW.  Pt seated in w/c in room upon arrival, reports pain in BLE and receiving pain meds from RN.  Pt propelled w/c to Day Room Mod I level, performed sidestepping Rt approx 10 feet while watering plants with distant supervision.  Pt required 1 rest break reporting pain and fatigue.  Pt sidestepped back to Lt mod I.  Educated pt on remaining primarily w/c level when at home and keeping cell phone on body in case of emergency.  Therapy Documentation Precautions:  Precautions Precautions: Anterior Hip;Fall Restrictions Weight Bearing Restrictions: Yes RLE Weight Bearing: Non weight bearing Pain: Pain Assessment Pain Assessment: 0-10 Pain Score: 8  Pain Type: Surgical pain Pain Location: Hip Pain Orientation: Right Pain Descriptors / Indicators: Aching Pain Frequency: Intermittent Pain Onset: Gradual Patients Stated Pain Goal: 3 Pain Intervention(s): Medication (See eMAR);Repositioned Multiple Pain Sites: No  See FIM for current functional status  Therapy/Group: Individual Therapy  Rosalio Loud 06/06/2013, 11:27 AM

## 2013-06-06 NOTE — Discharge Summary (Signed)
Discharge summary job # [id number]

## 2013-06-06 NOTE — Plan of Care (Signed)
Problem: RH SKIN INTEGRITY Goal: RH STG ABLE TO PERFORM INCISION/WOUND CARE W/ASSISTANCE STG Able To Perform Incision/Wound Care With [employer].  Outcome: Progressing Patient questions if dressing can be left off, patient can change the dressing with extra time and cues

## 2013-06-07 DIAGNOSIS — T84049A Periprosthetic fracture around unspecified internal prosthetic joint, initial encounter: Secondary | ICD-10-CM

## 2013-06-07 DIAGNOSIS — Z9660 Presence of unspecified orthopedic joint implant: Secondary | ICD-10-CM

## 2013-06-07 LAB — CREATININE, SERUM: Creatinine, Ser: 0.57 mg/dL (ref 0.50–1.10)

## 2013-06-07 MED ORDER — ALPRAZOLAM 0.5 MG PO TABS: 0.5000 mg | ORAL_TABLET | Freq: Three times a day (TID) | ORAL | Status: DC | PRN

## 2013-06-07 MED ORDER — FERROUS SULFATE 325 (65 FE) MG PO TABS: 325.0000 mg | ORAL_TABLET | Freq: Three times a day (TID) | ORAL | Status: DC

## 2013-06-07 MED ORDER — METHOCARBAMOL 500 MG PO TABS: 500.0000 mg | ORAL_TABLET | Freq: Four times a day (QID) | ORAL | Status: DC | PRN

## 2013-06-07 MED ORDER — NICOTINE 14 MG/24HR TD PT24: MEDICATED_PATCH | TRANSDERMAL | Status: DC

## 2013-06-07 MED ORDER — OXYCODONE-ACETAMINOPHEN 5-325 MG PO TABS: 1.0000 | ORAL_TABLET | ORAL | Status: DC | PRN

## 2013-06-07 MED ORDER — OXYCODONE HCL ER 10 MG PO T12A: 10.0000 mg | EXTENDED_RELEASE_TABLET | Freq: Two times a day (BID) | ORAL | Status: DC

## 2013-06-07 NOTE — Progress Notes (Addendum)
Subjective/Complaints: Still feels unsteady.  L hip and groin pain at times.  Sister will be at home with pt for several days  A 12 point review of systems has been performed and if not noted above is otherwise negative.   Objective: Vital Signs: Blood pressure 102/67, pulse 81, temperature 97.8 F (36.6 C), temperature source Oral, resp. rate 18, height 5\' 3"  (1.6 m), weight 65.409 kg (144 lb 3.2 oz), SpO2 98.00%. No results found. No results found for this basename: WBC, HGB, HCT, PLT,  in the last 72 hours  Recent Labs  06/07/13 0450  CREATININE 0.57   CBG (last 3)  No results found for this basename: GLUCAP,  in the last 72 hours  Wt Readings from Last 3 Encounters:  06/06/13 65.409 kg (144 lb 3.2 oz)  05/27/13 61.236 kg (135 lb)  05/27/13 61.236 kg (135 lb)    Physical Exam:  Blood pressure 118/78, pulse 86, temperature 97.8 F (36.6 C), temperature source Oral, resp. rate 18, height 5\' 3"  (1.6 m), weight 61.236 kg (135 lb), SpO2 99.00%.  Physical Exam  Constitutional: She is oriented to person, place, and time. She appears well-developed.  HENT:  Head: Normocephalic.  Eyes: EOM are normal.  Neck: Normal range of motion. Neck supple. No thyromegaly present.  Cardiovascular: Normal rate and regular rhythm.  Respiratory: Effort normal and breath sounds normal. No respiratory distress.  GI: Soft. Bowel sounds are normal. She exhibits no distension.  Musculoskeletal:  Right lateral hip incision staples intact with small amt serous drainage. Anterior incision well healed. RLE with 1+ edema . Appropriately tender. Able to move left hip,LE mild/ mod pain with int rotation Neurological: She is alert and oriented to person, place, and time.  Patient follows full commands.UE bilaterally 4+--5/5. LLE 5/5 RLE 1+ HF, 2- KE, 4/5 ADF/APF:  No sensory deficits  Psychiatric: She has a normal mood and affect. Her behavior is normal. Judgment and thought content normal       Assessment/Plan: 1. Functional deficits secondary to right periprosthetic femur fx s/p ORIF (recent right ant THA 9/14) which require 3+ hours per day of interdisciplinary therapy in a comprehensive inpatient rehab setting. Physiatrist is providing close team supervision and 24 hour management of active medical problems listed below. Physiatrist and rehab team continue to assess barriers to discharge/monitor patient progress toward functional and medical goals. Stable for D/C today F/u PCP in 1-2 weeks F/u ortho 1weeks See D/C summary See D/C instructions FIM: FIM - Bathing Bathing Steps Patient Completed: Chest;Right Arm;Left Arm;Abdomen;Front perineal area;Buttocks;Right upper leg;Left upper leg;Right lower leg (including foot);Left lower leg (including foot) Bathing: 6: Assistive device (Comment) (LH sponge)  FIM - Upper Body Dressing/Undressing Upper body dressing/undressing steps patient completed: Thread/unthread right sleeve of pullover shirt/dresss;Thread/unthread left sleeve of pullover shirt/dress;Put head through opening of pull over shirt/dress;Pull shirt over trunk Upper body dressing/undressing: 7: Complete Independence: No helper FIM - Lower Body Dressing/Undressing Lower body dressing/undressing steps patient completed: Thread/unthread right underwear leg;Thread/unthread left underwear leg;Pull underwear up/down;Thread/unthread right pants leg;Thread/unthread left pants leg;Pull pants up/down;Fasten/unfasten pants;Don/Doff right sock;Don/Doff left sock;Don/Doff right shoe;Don/Doff left shoe;Fasten/unfasten right shoe;Fasten/unfasten left shoe (sandals, reacher) Lower body dressing/undressing: 6: Assistive device (Comment)  FIM - Toileting Toileting steps completed by patient: Adjust clothing prior to toileting;Performs perineal hygiene;Adjust clothing after toileting Toileting Assistive Devices: Grab bar or rail for support Toileting: 5: Supervision: Safety issues/verbal  cues (patient mod I wants supervision going to the bathroom.)  FIM - Diplomatic Services operational officer Devices:  Bedside commode;Laidler (BSC over toilet) Toilet Transfers: 6-To toilet/ BSC;6-From toilet/BSC  FIM - Banker Devices: Duby;Arm rests Bed/Chair Transfer: 6: Sit > Supine: No assist;6: Supine > Sit: No assist;6: Assistive device: no helper;6: Bed > Chair or W/C: No assist;6: Chair or W/C > Bed: No assist  FIM - Locomotion: Wheelchair Distance: 160 Locomotion: Wheelchair: 6: Travels 150 ft or more, turns around, maneuvers to table, bed or toilet, negotiates 3% grade: maneuvers on rugs and over door sills independently FIM - Locomotion: Ambulation Locomotion: Ambulation Assistive Devices: Designer, industrial/product Ambulation/Gait Assistance: 6: Modified independent (Device/Increase time) Locomotion: Ambulation: 5: Household Independent - travels 50 - 149 ft independent or modified independent  Comprehension Comprehension Mode: Auditory Comprehension: 7-Follows complex conversation/direction: With no assist  Expression Expression Mode: Verbal Expression: 7-Expresses complex ideas: With no assist  Social Interaction Social Interaction: 7-Interacts appropriately with others - No medications needed.  Problem Solving Problem Solving: 6-Solves complex problems: With extra time  Memory Memory: 5-Recognizes or recalls 90% of the time/requires cueing < 10% of the time  Medical Problem List and Plan:  1. Right periprosthetic hip fracture after fall status post ORIF 05/28/2013. 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 DVT Prophylaxis/Anticoagulation: Subcutaneous Lovenox. Monitor platelet counts and any signs of bleeding.   -dopplers negative  -ice to left groin for edema and bruising 3. Pain Management: Percocet and Robaxin as neededhas L hip OA as well.  Pt request injection but this would need to be under fluoro and likely  have short term relief only.. Monitor with increased mobility, pain improved with oxycontin  - scheduled percocet at 0600 and 1200 daily  Will give 1 month supply , further refill per ortho 4. Mood/anxiety. is on Ativan 0.5 mg every 8 hours as needed.  5. Neuropsych: This patient is capable of making decisions on her own behalf.  6. Acute blood loss anemia. Patient has been transfused. Continue iron supplement. Followup hgb 11.1  7. Tobacco abuse. NicoDerm patch. Provide counseling  8.GERD.zantac  LOS (Days) 7 A FACE TO FACE EVALUATION WAS PERFORMED   Erick Colace 06/07/2013 7:53 AM

## 2013-06-07 NOTE — Progress Notes (Signed)
Pt. Got d/c orders and instructions,as well as prescriptions.Equipment was delivery to pt.'s room.pt. Ready to go home with her family.

## 2013-06-07 NOTE — Progress Notes (Signed)
Occupational Therapy Discharge Summary  Patient Details  Name: Jillisa Harris MRN: [id number] Date of Birth: 1961/04/10  Today's Date: 06/07/2013    Patient has met 10 of 10 long term goals due to improved activity tolerance, improved balance, improved attention, improved awareness and improved coordination.  Patient to discharge at overall Modified Independent level.  Patient's care partner not necessary  to provide the necessary physical and cognitive assistance at discharge secondary to discharging at Mod I level.    Reasons goals not met: N/A. All LTGs met.   Recommendation:  Patient will benefit from ongoing skilled OT services in home health setting to continue to advance functional skills in the area of BADL and iADL.  Equipment: TTB  Reasons for discharge: treatment goals met and discharge from hospital  Patient/family agrees with progress made and goals achieved: Yes  OT Discharge Precautions/Restrictions    General   Vital Signs   Pain Pain Assessment Pain Assessment: 0-10 Pain Score: 4  Pain Type: Surgical pain Pain Location: Hip Pain Orientation: Right Pain Descriptors / Indicators: Aching Pain Frequency: Intermittent Pain Onset: Gradual Pain Intervention(s): Medication (See eMAR) Multiple Pain Sites: No ADL   Vision/Perception     Cognition   Sensation   Motor    Mobility     Trunk/Postural Assessment     Balance   Extremity/Trunk Assessment      See FIM for current functional status  Kristy Catoe N 06/07/2013, 1:10 PM

## 2013-06-07 NOTE — Discharge Instructions (Signed)
Inpatient Rehab Discharge Instructions  Jaime Phillips Discharge date and time: No discharge date for patient encounter.   Activities/Precautions/ Functional Status: Activity: Nonweightbearing right lower extremity Diet: regular diet Wound Care: keep wound clean and dry Functional status:  ___ No restrictions     ___ Walk up steps independently ___ 24/7 supervision/assistance   ___ Walk up steps with assistance ___ Intermittent supervision/assistance  ___ Bathe/dress independently ___ Walk with [place]     ___ Bathe/dress with assistance ___ Walk Independently    ___ Shower independently __x_ Walk with assistance    ___ Shower with assistance ___ No alcohol     ___ Return to work/school ________  Special Instructions:    COMMUNITY REFERRALS UPON DISCHARGE:    Home Health:   PT, RN, SW  Agency:[facility]  Phone:[phone] Date of last service:06/07/2013   Medical Equipment/Items Ordered:TUB SEAT & WHEELCHAIR  Agency/Supplier:[facility] Other:[facility] TO FOLLOW UP WITH APPLICATION [facility] APPT-07/12/2013  [facility] APPT 06/18/2013  PT HAS ALL INFORMATION     My questions have been answered and I understand these instructions. I will adhere to these goals and the provided educational materials after my discharge from the hospital.  Patient/Caregiver Signature _______________________________ Date __________  Clinician Signature _______________________________________ Date __________  Please bring this form and your medication list with you to all your follow-up doctor's appointments.

## 2013-06-07 NOTE — Progress Notes (Signed)
Social Work Discharge Note Discharge Note  The overall goal for the admission was met for:   Discharge location: Yes-HOME WITH BOYFRIEND AND SISTER TO ASSIST  Length of Stay: Yes-7 DAYS  Discharge activity level: Yes-MOD/I-SUPERVISION LEVEL  Home/community participation: Yes  Services provided included: MD, RD, PT, OT, RN, Pharmacy and SW  Financial Services: Other: MEDICAID PENDING  Follow-up services arranged: Home Health: [facility]-PT,RN,SW, DME: [facility]-TUB SEAT, WHEELCHAIR and Patient/Family has no preference for HH/DME agencies  Comments (or additional information):PT AT TIMES DOE SNOT FOLLOW HER PRECAUTIONS IS AWARE OF THEM-AWARE SAFETY CONCERN OF TEAM-STATES: " I WILL BE MORE CAREFUL." [facility]-07/12/2013        [facility] 06/18/2013 patient has written information regarding these appointments Patient/Family verbalized understanding of follow-up arrangements: Yes  Individual responsible for coordination of the follow-up plan: SELF & ARTHUR-BOYFRIEND  Confirmed correct DME delivered: Lucy Chris 06/07/2013    Lucy Chris

## 2013-06-07 NOTE — Discharge Summary (Signed)
[name], Jaime Phillips             ACCOUNT NO.:  [id number]  MEDICAL RECORD NO.:  [id number]  LOCATION:  4M02C                        FACILITY:  [facility]  PHYSICIAN:  Erick Colace, M.D.DATE OF BIRTH:  [birth year]  DATE OF ADMISSION:  05/31/2013 DATE OF DISCHARGE:  06/07/2013                              DISCHARGE SUMMARY   DISCHARGE DIAGNOSES: 1. Right periprosthetic hip fracture after a fall with open reduction     and internal fixation. 2. Subcutaneous Lovenox for DVT prophylaxis. 3. Pain management. 4. Anxiety. 5. Acute blood loss anemia. 6. Tobacco abuse. 7. Gastroesophageal reflux disease.  HISTORY OF PRESENT ILLNESS:  This is a 52 year old right-handed female with history of right total hip replacement anterior direct approach secondary to osteoarthritis on April 10, 2013, discharged to home on April 13, 2013, with home therapies.  Admitted May 27, 2013, after a fall landing on the right hip while walking to the kitchen.  X- rays and imaging revealed right periprosthetic proximal femur fracture with stable prosthesis.  Underwent ORIF on May 28, 2013, per Dr. Charlann Boxer.  Advised nonweightbearing, right lower extremity.  Placed on subcutaneous Lovenox for DVT prophylaxis.  Acute blood loss anemia 6.7 transfused, latest hemoglobin 10.3.  Physical and occupational therapy ongoing.  The patient was admitted for a comprehensive rehab program.  PAST MEDICAL HISTORY:  See discharge diagnoses.  SOCIAL HISTORY:  Lives alone.  FUNCTIONAL HISTORY:  Prior to admission independent with a [place].  FUNCTIONAL STATUS:  Upon admission to rehab services +2 total assist 5 feet with a rolling Mena.  PHYSICAL EXAMINATION:  VITAL SIGNS:  Blood pressure 118/78, pulse 86, temperature 97.8, respirations 18. GENERAL:  This was an alert female in no acute distress, oriented x3. Well developed. HEENT:  Pupils round and reactive to light. LUNGS:  Clear to auscultation. CARDIAC:   Regular rate and rhythm. ABDOMEN:  Soft, nontender.  Good bowel sounds. MUSCULOSKELETAL:  Right hip incision was dressed with small amount of fresh blood along the staple line.  Incision site was appropriately tender.  REHABILITATION HOSPITAL COURSE:  The patient was admitted to inpatient rehab services with therapies initiated on a 3-hour daily basis consisting of physical therapy, occupational therapy, and rehabilitation nursing.  The following issues were addressed during the patient's rehabilitation stay.  Pertaining to Ms. Jaime Phillips's right periprosthetic hip fracture, she had undergone ORIF on May 28, 2013, with noted recent right total hip arthroplasty on April 10, 2013.  She remained nonweightbearing, right lower extremity.  Neurovascular sensation intact.  Subcutaneous Lovenox for DVT prophylaxis.  Venous Doppler studies negative.  Pain management with the use of Percocet as well as Robaxin and monitored.  She did have some acute blood loss anemia, maintained on iron supplement, latest hemoglobin 11.1.  Noted history of tobacco abuse on a NicoDerm patch, which was tapered, counseling provided in regard to cessation of smoking.  It was questionable if she would be compliant with these request.  The patient received weekly collaborative interdisciplinary team conferences to discuss estimated length of stay, family teaching, and any barriers to discharge.  She was ambulating greater than 50 feet with a rolling Vater.  She was following her nonweightbearing precautions.  Propelling her wheelchair independently.  She could negotiate stairs without rails using a rolling [place].  The patient able to complete bed mobility unassisted without bed rails and ambulate to the bathroom to complete toileting and bathing independently modified by use of a rolling [place].  Full teaching was completed and plan was to be discharged to home.  DISCHARGE MEDICATIONS: 1. Xanax 0.5 mg p.o.  t.i.d. as needed anxiety. 2. Ferrous sulfate 325 mg p.o. t.i.d. 3. Robaxin 500 mg p.o. every 6 hours as needed for muscle spasms. 4. Multivitamin 1 tablet daily. 5. NicoDerm patch 14 mg, taper as advised. 6. OxyContin sustained release 10 mg p.o. every 12 hours x1 week. 7. Percocet 5/325 one or two tablets every 4 hours as needed for pain,     dispensed 90 tablets. 8. Protonix 40 mg p.o. daily. 9. Zantac 150 mg p.o. b.i.d.  DIET:  Regular.  SPECIAL INSTRUCTIONS:  Nonweightbearing, right lower extremity.  The patient would follow up with Dr. Durene Romans, Orthopedic Services in 2 weeks, call for appointment.  Dr. Claudette Laws, at the outpatient rehab center as needed.  Ongoing therapies were arranged as per rehab services.     [name], P.A.   ______________________________ Erick Colace, M.D.    DA/MEDQ  D:  06/06/2013  T:  06/07/2013  Job:  [id number]  cc:   Madlyn Frankel Charlann Boxer, M.D.

## 2013-06-08 ENCOUNTER — Telehealth: Payer: Self-pay

## 2013-06-08 NOTE — Telephone Encounter (Signed)
Patient called to find out if we could ok filling her narcotics.  Patient does not have identification and pharmacy will no fill the medications.  Advised patient to go to [facility] and get identification.  She agreed.

## 2013-06-18 ENCOUNTER — Ambulatory Visit: Payer: Self-pay

## 2013-07-04 ENCOUNTER — Ambulatory Visit: Payer: Self-pay

## 2013-07-12 ENCOUNTER — Inpatient Hospital Stay: Payer: Self-pay | Admitting: Internal Medicine

## 2013-09-05 ENCOUNTER — Ambulatory Visit: Payer: Self-pay

## 2013-10-09 ENCOUNTER — Other Ambulatory Visit (HOSPITAL_COMMUNITY): Payer: Self-pay | Admitting: Physician Assistant

## 2013-10-09 DIAGNOSIS — Z1231 Encounter for screening mammogram for malignant neoplasm of breast: Secondary | ICD-10-CM

## 2013-10-23 ENCOUNTER — Ambulatory Visit (HOSPITAL_COMMUNITY): Payer: Medicaid Other | Attending: Physician Assistant

## 2013-11-28 ENCOUNTER — Ambulatory Visit (HOSPITAL_COMMUNITY)
Admission: RE | Admit: 2013-11-28 | Discharge: 2013-11-28 | Disposition: A | Discharge status: Home / Self Care | Payer: Medicaid Other | Source: Ambulatory Visit | Attending: Physician Assistant | Admitting: Physician Assistant

## 2013-11-28 DIAGNOSIS — Z1231 Encounter for screening mammogram for malignant neoplasm of breast: Secondary | ICD-10-CM

## 2014-01-17 ENCOUNTER — Encounter: Payer: Self-pay | Admitting: Internal Medicine

## 2014-01-17 ENCOUNTER — Ambulatory Visit: Payer: Medicaid Other | Attending: Internal Medicine | Admitting: Internal Medicine

## 2014-01-17 VITALS — BP 127/86 | HR 104 | Temp 98.7°F | Resp 16 | Ht 63.0 in | Wt 142.0 lb

## 2014-01-17 DIAGNOSIS — N390 Urinary tract infection, site not specified: Secondary | ICD-10-CM | POA: Diagnosis not present

## 2014-01-17 DIAGNOSIS — T148 Other injury of unspecified body region: Secondary | ICD-10-CM | POA: Diagnosis not present

## 2014-01-17 DIAGNOSIS — K029 Dental caries, unspecified: Secondary | ICD-10-CM | POA: Insufficient documentation

## 2014-01-17 DIAGNOSIS — W57XXXA Bitten or stung by nonvenomous insect and other nonvenomous arthropods, initial encounter: Secondary | ICD-10-CM | POA: Diagnosis not present

## 2014-01-17 LAB — TSH: TSH: 0.41 u[IU]/mL (ref 0.350–4.500)

## 2014-01-17 LAB — POCT URINALYSIS DIPSTICK
GLUCOSE UA: NEGATIVE
Ketones, UA: 15
Leukocytes, UA: NEGATIVE
NITRITE UA: NEGATIVE
Protein, UA: 100
Spec Grav, UA: >=1.03
UROBILINOGEN UA: 1
pH, UA: 6

## 2014-01-17 LAB — COMPLETE METABOLIC PANEL WITH GFR
ALBUMIN: 4.7 g/dL (ref 3.5–5.2)
ALT: 148 U/L — ABNORMAL HIGH (ref 0–35)
AST: 320 U/L — ABNORMAL HIGH (ref 0–37)
Alkaline Phosphatase: 100 U/L (ref 39–117)
BUN: 11 mg/dL (ref 6–23)
CALCIUM: 8.8 mg/dL (ref 8.4–10.5)
CHLORIDE: 103 meq/L (ref 96–112)
CO2: 22 meq/L (ref 19–32)
Creat: 0.67 mg/dL (ref 0.50–1.10)
GLUCOSE: 82 mg/dL (ref 70–99)
POTASSIUM: 3.7 meq/L (ref 3.5–5.3)
SODIUM: 141 meq/L (ref 135–145)
TOTAL PROTEIN: 7.2 g/dL (ref 6.0–8.3)
Total Bilirubin: 0.6 mg/dL (ref 0.2–1.2)

## 2014-01-17 LAB — CBC WITH DIFFERENTIAL/PLATELET
Basophils Absolute: 0 10*3/uL (ref 0.0–0.1)
Basophils Relative: 0 % (ref 0–1)
Eosinophils Absolute: 0.1 10*3/uL (ref 0.0–0.7)
Eosinophils Relative: 1 % (ref 0–5)
HCT: 39.1 % (ref 36.0–46.0)
HEMOGLOBIN: 13.6 g/dL (ref 12.0–15.0)
LYMPHS ABS: 2.1 10*3/uL (ref 0.7–4.0)
Lymphocytes Relative: 38 % (ref 12–46)
MCH: 35 pg — ABNORMAL HIGH (ref 26.0–34.0)
MCHC: 34.8 g/dL (ref 30.0–36.0)
MCV: 100.5 fL — ABNORMAL HIGH (ref 78.0–100.0)
Monocytes Absolute: 0.2 10*3/uL (ref 0.1–1.0)
Monocytes Relative: 4 % (ref 3–12)
NEUTROS ABS: 3.2 10*3/uL (ref 1.7–7.7)
NEUTROS PCT: 57 % (ref 43–77)
Platelets: 219 10*3/uL (ref 150–400)
RBC: 3.89 MIL/uL (ref 3.87–5.11)
RDW: 14.3 % (ref 11.5–15.5)
WBC: 5.6 10*3/uL (ref 4.0–10.5)

## 2014-01-17 LAB — LIPID PANEL
Cholesterol: 224 mg/dL — ABNORMAL HIGH (ref 0–200)
HDL: 99 mg/dL (ref >39)
LDL CALC: 108 mg/dL — AB (ref 0–99)
Total CHOL/HDL Ratio: 2.3 Ratio
Triglycerides: 83 mg/dL (ref <150)
VLDL: 17 mg/dL (ref 0–40)

## 2014-01-17 LAB — POCT GLYCOSYLATED HEMOGLOBIN (HGB A1C): Hemoglobin A1C: 4.9

## 2014-01-17 MED ORDER — DIPHENHYDRAMINE HCL 25 MG PO TABS: 25.0000 mg | ORAL_TABLET | Freq: Four times a day (QID) | ORAL | Status: DC | PRN

## 2014-01-17 MED ORDER — TRIAMCINOLONE ACETONIDE 0.1 % EX CREA: 1.0000 "application " | TOPICAL_CREAM | Freq: Two times a day (BID) | CUTANEOUS | Status: DC

## 2014-01-17 NOTE — Progress Notes (Signed)
Patient ID: Jaime Phillips, female   DOB: 09/16/1960, 53 y.o.   MRN: [id number]   Jaime Phillips, is a 53 y.o. female  GEX:[id number]  WNU:[id number]  DOB - 07-Mar-1961  CC:  Chief Complaint  Patient presents with  . Establish Care       HPI: Jaime Phillips is a 53 y.o. female here today to establish medical care. Patient has history of right total hip replacement and a complicated hip/femoral fracture status post internal reduction and fixation, anxiety disorder and chronic pain here today with complaints of bloody rashes. She was recently lodged in a hotel and that on waking up she described multiple rashes and itching mostly on the upper or lower limbs , she saw bedbugs on the bed. She continues to itch so badly that she has scratch marks. She wants some form of treatment and also a note to confirm her diagnosis. She also has dental caries that she would like to be addressed with a dentist. Patient has No headache, No chest pain, No abdominal pain - No Nausea, No new weakness tingling or numbness, No Cough - SOB.  Allergies  Allergen Reactions  . Codeine Other (See Comments)    hallucinating  . Morphine And Related     Made head feel funny   Past Medical History  Diagnosis Date  . Abnormal uterine bleeding   . Depression   . Arthritis   . Rectal bleeding     minor    Current Outpatient Prescriptions on File Prior to Visit  Medication Sig Dispense Refill  . acetaminophen (TYLENOL) 500 MG tablet Take 500 mg by mouth every 6 (six) hours as needed (pain). For pain.      Marland Kitchen ALPRAZolam (XANAX) 0.5 MG tablet Take 1 tablet (0.5 mg total) by mouth 3 (three) times daily as needed for anxiety.  30 tablet  0  . Coenzyme Q10 (CO Q 10 PO) Take by mouth daily.      . methocarbamol (ROBAXIN) 500 MG tablet Take 1 tablet (500 mg total) by mouth every 6 (six) hours as needed (muscle spasms).  90 tablet  0  . Multiple Vitamin (MULTIVITAMIN WITH MINERALS) TABS tablet Take 1 tablet by mouth daily.       . ranitidine (ZANTAC) 150 MG tablet Take 1 tablet (150 mg total) by mouth 2 (two) times daily.      . vitamin E 400 UNIT capsule Take 400 Units by mouth daily.      . calcium carbonate (TUMS) 500 MG chewable tablet Chew 1 tablet (200 mg of elemental calcium total) by mouth 3 (three) times daily.      . ferrous sulfate 325 (65 FE) MG tablet Take 1 tablet (325 mg total) by mouth 3 (three) times daily after meals.  90 tablet  3  . nicotine (NICODERM CQ - DOSED IN MG/24 HOURS) 14 mg/24hr patch 14 mg patch daily x1 week then 7 mg patch daily x3 weeks.  28 patch  0  . OxyCODONE (OXYCONTIN) 10 mg T12A 12 hr tablet Take 1 tablet (10 mg total) by mouth every 12 (twelve) hours.  28 tablet  0  . oxyCODONE-acetaminophen (PERCOCET/ROXICET) 5-325 MG per tablet Take 1-2 tablets by mouth every 4 (four) hours as needed for severe pain.  90 tablet  0   No current facility-administered medications on file prior to visit.   Family History  Problem Relation Age of Onset  . Diabetes Mother   . Kidney disease Mother   . Heart disease  Father   . Colon cancer Father     "father may have had colon cancer"  . Cancer Sister     breast   History   Social History  . Marital Status: Single    Spouse Name: N/A    Number of Children: 1  . Years of Education: N/A   Occupational History  . disabled    Social History Main Topics  . Smoking status: Current Every Day Smoker -- 0.50 packs/day for 25 years    Types: Cigarettes  . Smokeless tobacco: Never Used     Comment: Tobacco info given to pt. 08/16/12  . Alcohol Use: 1.8 oz/week    3 Shots of liquor per week  . Drug Use: No  . Sexual Activity: Not Currently    Birth Control/ Protection: Injection   Other Topics Concern  . Not on file   Social History Narrative  . No narrative on file    Review of Systems: Constitutional: Negative for fever, chills, diaphoresis, activity change, appetite change and fatigue. HENT: Negative for ear pain, nosebleeds,  congestion, facial swelling, rhinorrhea, neck pain, neck stiffness and ear discharge.  Eyes: Negative for pain, discharge, redness, itching and visual disturbance. Respiratory: Negative for cough, choking, chest tightness, shortness of breath, wheezing and stridor.  Cardiovascular: Negative for chest pain, palpitations and leg swelling. Gastrointestinal: Negative for abdominal distention. Genitourinary: Negative for dysuria, urgency, frequency, hematuria, flank pain, decreased urine volume, difficulty urinating and dyspareunia.  Musculoskeletal: Negative for back pain, joint swelling, arthralgia and gait problem. Neurological: Negative for dizziness, tremors, seizures, syncope, facial asymmetry, speech difficulty, weakness, light-headedness, numbness and headaches.  Hematological: Negative for adenopathy. Does not bruise/bleed easily. Psychiatric/Behavioral: Negative for hallucinations, behavioral problems, confusion, dysphoric mood, decreased concentration and agitation.    Objective:   Filed Vitals:   01/17/14 1542  BP: 127/86  Pulse: 104  Temp: 98.7 F (37.1 C)  Resp: 16    Physical Exam: Constitutional: Patient appears well-developed and well-nourished. No distress. HENT: Normocephalic, atraumatic, External right and left ear normal. Oropharynx is clear and moist.  Eyes: Conjunctivae and EOM are normal. PERRLA, no scleral icterus. Neck: Normal ROM. Neck supple. No JVD. No tracheal deviation. No thyromegaly. CVS: RRR, S1/S2 +, no murmurs, no gallops, no carotid bruit.  Pulmonary: Effort and breath sounds normal, no stridor, rhonchi, wheezes, rales.  Abdominal: Soft. BS +, no distension, tenderness, rebound or guarding.  Musculoskeletal: Normal range of motion. No edema and no tenderness.  Lymphadenopathy: No lymphadenopathy noted, cervical, inguinal or axillary Neuro: Alert. Normal reflexes, muscle tone coordination. No cranial nerve deficit. Skin: Skin is warm and dry. Multiple  maculopapular rashes on upper and lower limbs with punctate centers resembling insect bites  Psychiatric: Normal mood and affect. Behavior, judgment, thought content normal.  Lab Results  Component Value Date   WBC 8.1 06/01/2013   HGB 11.1* 06/01/2013   HCT 31.7* 06/01/2013   MCV 96.6 06/01/2013   PLT 302 06/01/2013   Lab Results  Component Value Date   CREATININE 0.57 06/07/2013   BUN 8 06/01/2013   NA 138 06/01/2013   K 4.1 06/01/2013   CL 102 06/01/2013   CO2 25 06/01/2013    No results found for this basename: HGBA1C   Lipid Panel  No results found for this basename: chol, trig, hdl, cholhdl, vldl, ldlcalc       Assessment and plan:   1. UTI (lower urinary tract infection)  - CBC with Differential - COMPLETE METABOLIC PANEL WITH  GFR - POCT glycosylated hemoglobin (Hb A1C) - Lipid panel - TSH  2. Bed bug bite  - diphenhydrAMINE (BENADRYL) 25 MG tablet; Take 1 tablet (25 mg total) by mouth every 6 (six) hours as needed.  Dispense: 30 tablet; Refill: 0 - triamcinolone cream (KENALOG) 0.1 %; Apply 1 application topically 2 (two) times daily.  Dispense: 30 g; Refill: 0  3. Dental caries  - Ambulatory referral to Dentistry  Patient was extensively counseled on nutrition and exercise Patient was counseled on smoking  Return in about 6 months (around 07/19/2014), or if symptoms worsen or fail to improve, for Follow up Pain and comorbidities, Pap Smear.  The patient was given clear instructions to go to ER or return to medical center if symptoms don't improve, worsen or new problems develop. The patient verbalized understanding. The patient was told to call to get lab results if they haven't heard anything in the next week.     This note has been created with Surveyor, quantity. Any transcriptional errors are unintentional.    Angelica Chessman, MD, [place], [place], [place], [place]   01/17/2014, 4:42 PM

## 2014-01-17 NOTE — Progress Notes (Signed)
Pt is here to establish care. Pt reports that she was in a hotel this weekend and woke up with a rash. She thinks that she has bed bugs. Pt states that she has chronic hip pain. She also states that she may have a absess on her mouth causing her to have a swollen face.

## 2014-01-17 NOTE — Patient Instructions (Signed)

## 2014-01-21 ENCOUNTER — Telehealth: Payer: Self-pay | Admitting: *Deleted

## 2014-01-21 ENCOUNTER — Telehealth: Payer: Self-pay | Admitting: Emergency Medicine

## 2014-01-21 NOTE — Telephone Encounter (Signed)
Patient returning call to get lab results. Informed patient are mostly within normal limit except for liver function, there is evidence of increased liver enzymes. Patient states she does consume alcohol everyday. Informed patient she will need to quit consuming alcohol and that PCP will recheck liver functions in 07/2014 during her next visit. Patient states she is also taking vitamins for her liver and wanted to know if she needed to stop taking these vitamins. Vivia Birmingham, RN

## 2014-01-21 NOTE — Telephone Encounter (Signed)
Left message for pt to call clinic when message received 

## 2014-01-21 NOTE — Telephone Encounter (Signed)
Message copied by Ricci Barker on Mon Jan 21, 2014  3:18 PM ------      Message from: Tresa Garter      Created: Sun Jan 20, 2014  5:57 PM       Please inform patient that her lab results are mostly within normal limit except for liver function, there is evidence of increased liver enzymes, this may be due to alcohol consumption or injury from medication. He patient consumes alcohol, we encourage her to quit, otherwise we need to retest her to further investigate the reason for increased liver enzymes ------

## 2014-01-25 ENCOUNTER — Telehealth: Payer: Self-pay | Admitting: Internal Medicine

## 2014-01-25 NOTE — Telephone Encounter (Signed)
Pt wanted to be seen as walk-in today. Says she is experiencing upper abd and lower chest pain, not sure if it could be due to liver issues.  Please f/u with pt.

## 2014-02-05 ENCOUNTER — Encounter (HOSPITAL_COMMUNITY): Payer: Self-pay | Admitting: Emergency Medicine

## 2014-02-05 ENCOUNTER — Inpatient Hospital Stay (HOSPITAL_COMMUNITY)
Admission: EM | Admit: 2014-02-05 | Discharge: 2014-02-07 | DRG: 378 | Disposition: A | Discharge status: Home / Self Care | Payer: Medicaid Other | Attending: Internal Medicine | Admitting: Internal Medicine

## 2014-02-05 ENCOUNTER — Emergency Department (HOSPITAL_COMMUNITY): Payer: Medicaid Other

## 2014-02-05 DIAGNOSIS — D649 Anemia, unspecified: Secondary | ICD-10-CM | POA: Diagnosis present

## 2014-02-05 DIAGNOSIS — M25559 Pain in unspecified hip: Secondary | ICD-10-CM | POA: Diagnosis present

## 2014-02-05 DIAGNOSIS — K2961 Other gastritis with bleeding: Principal | ICD-10-CM | POA: Diagnosis present

## 2014-02-05 DIAGNOSIS — K922 Gastrointestinal hemorrhage, unspecified: Secondary | ICD-10-CM | POA: Diagnosis present

## 2014-02-05 DIAGNOSIS — E876 Hypokalemia: Secondary | ICD-10-CM | POA: Diagnosis present

## 2014-02-05 DIAGNOSIS — I10 Essential (primary) hypertension: Secondary | ICD-10-CM | POA: Diagnosis present

## 2014-02-05 DIAGNOSIS — F10920 Alcohol use, unspecified with intoxication, uncomplicated: Secondary | ICD-10-CM

## 2014-02-05 DIAGNOSIS — F10929 Alcohol use, unspecified with intoxication, unspecified: Secondary | ICD-10-CM

## 2014-02-05 DIAGNOSIS — F1020 Alcohol dependence, uncomplicated: Secondary | ICD-10-CM | POA: Diagnosis present

## 2014-02-05 DIAGNOSIS — K208 Other esophagitis without bleeding: Secondary | ICD-10-CM | POA: Diagnosis present

## 2014-02-05 DIAGNOSIS — G894 Chronic pain syndrome: Secondary | ICD-10-CM | POA: Diagnosis present

## 2014-02-05 DIAGNOSIS — D62 Acute posthemorrhagic anemia: Secondary | ICD-10-CM

## 2014-02-05 DIAGNOSIS — F329 Major depressive disorder, single episode, unspecified: Secondary | ICD-10-CM | POA: Diagnosis present

## 2014-02-05 DIAGNOSIS — R079 Chest pain, unspecified: Secondary | ICD-10-CM | POA: Diagnosis present

## 2014-02-05 DIAGNOSIS — Z7982 Long term (current) use of aspirin: Secondary | ICD-10-CM

## 2014-02-05 DIAGNOSIS — B3749 Other urogenital candidiasis: Secondary | ICD-10-CM | POA: Diagnosis present

## 2014-02-05 DIAGNOSIS — K219 Gastro-esophageal reflux disease without esophagitis: Secondary | ICD-10-CM

## 2014-02-05 DIAGNOSIS — E870 Hyperosmolality and hypernatremia: Secondary | ICD-10-CM | POA: Diagnosis present

## 2014-02-05 DIAGNOSIS — F17200 Nicotine dependence, unspecified, uncomplicated: Secondary | ICD-10-CM | POA: Diagnosis present

## 2014-02-05 DIAGNOSIS — Z96649 Presence of unspecified artificial hip joint: Secondary | ICD-10-CM

## 2014-02-05 DIAGNOSIS — M129 Arthropathy, unspecified: Secondary | ICD-10-CM | POA: Diagnosis present

## 2014-02-05 DIAGNOSIS — Z791 Long term (current) use of non-steroidal anti-inflammatories (NSAID): Secondary | ICD-10-CM

## 2014-02-05 DIAGNOSIS — F3289 Other specified depressive episodes: Secondary | ICD-10-CM | POA: Diagnosis present

## 2014-02-05 HISTORY — DX: Headache: R51

## 2014-02-05 LAB — CBC
HEMATOCRIT: 38 % (ref 36.0–46.0)
Hemoglobin: 12.8 g/dL (ref 12.0–15.0)
MCH: 35.7 pg — ABNORMAL HIGH (ref 26.0–34.0)
MCHC: 33.7 g/dL (ref 30.0–36.0)
MCV: 105.8 fL — ABNORMAL HIGH (ref 78.0–100.0)
Platelets: 274 10*3/uL (ref 150–400)
RBC: 3.59 MIL/uL — ABNORMAL LOW (ref 3.87–5.11)
RDW: 13.7 % (ref 11.5–15.5)
WBC: 6.9 10*3/uL (ref 4.0–10.5)

## 2014-02-05 LAB — COMPREHENSIVE METABOLIC PANEL
ALT: 217 U/L — ABNORMAL HIGH (ref 0–35)
AST: 269 U/L — ABNORMAL HIGH (ref 0–37)
Albumin: 4.2 g/dL (ref 3.5–5.2)
Alkaline Phosphatase: 102 U/L (ref 39–117)
Anion gap: 23 — ABNORMAL HIGH (ref 5–15)
BUN: 14 mg/dL (ref 6–23)
CALCIUM: 9 mg/dL (ref 8.4–10.5)
CO2: 22 mEq/L (ref 19–32)
CREATININE: 0.55 mg/dL (ref 0.50–1.10)
Chloride: 106 mEq/L (ref 96–112)
GFR calc Af Amer: >90 mL/min (ref >90)
GFR calc non Af Amer: >90 mL/min (ref >90)
Glucose, Bld: 76 mg/dL (ref 70–99)
Potassium: 3.5 mEq/L — ABNORMAL LOW (ref 3.7–5.3)
Sodium: 151 mEq/L — ABNORMAL HIGH (ref 137–147)
TOTAL PROTEIN: 7.2 g/dL (ref 6.0–8.3)
Total Bilirubin: 0.3 mg/dL (ref 0.3–1.2)

## 2014-02-05 LAB — I-STAT TROPONIN, ED: Troponin i, poc: 0 ng/mL (ref 0.00–0.08)

## 2014-02-05 LAB — URINE MICROSCOPIC-ADD ON

## 2014-02-05 LAB — URINALYSIS, ROUTINE W REFLEX MICROSCOPIC
Bilirubin Urine: NEGATIVE
Glucose, UA: NEGATIVE mg/dL
Ketones, ur: 15 mg/dL — AB
LEUKOCYTES UA: NEGATIVE
Nitrite: NEGATIVE
PH: 6 (ref 5.0–8.0)
Protein, ur: NEGATIVE mg/dL
SPECIFIC GRAVITY, URINE: 1.015 (ref 1.005–1.030)
Urobilinogen, UA: 1 mg/dL (ref 0.0–1.0)

## 2014-02-05 LAB — RAPID URINE DRUG SCREEN, HOSP PERFORMED
AMPHETAMINES: NOT DETECTED
Barbiturates: NOT DETECTED
Benzodiazepines: NOT DETECTED
Cocaine: NOT DETECTED
OPIATES: NOT DETECTED
Tetrahydrocannabinol: NOT DETECTED

## 2014-02-05 LAB — ETHANOL: ALCOHOL ETHYL (B): 349 mg/dL — AB (ref 0–11)

## 2014-02-05 LAB — TROPONIN I: Troponin I: <0.3 ng/mL (ref <0.30)

## 2014-02-05 MED ORDER — ONDANSETRON HCL 4 MG/2ML IJ SOLN
INTRAMUSCULAR | Status: AC
  Filled 2014-02-05: qty 2

## 2014-02-05 MED ORDER — NITROGLYCERIN 0.4 MG SL SUBL
SUBLINGUAL_TABLET | SUBLINGUAL | Status: AC
  Filled 2014-02-05: qty 1

## 2014-02-05 MED ORDER — ONDANSETRON HCL 4 MG/2ML IJ SOLN
4.0000 mg | Freq: Once | INTRAMUSCULAR | Status: AC
  Administered 2014-02-05: 4 mg via INTRAVENOUS

## 2014-02-05 MED ORDER — SODIUM CHLORIDE 0.9 % IV BOLUS (SEPSIS)
1000.0000 mL | Freq: Once | INTRAVENOUS | Status: AC
  Administered 2014-02-05: 1000 mL via INTRAVENOUS

## 2014-02-05 MED ORDER — PANTOPRAZOLE SODIUM 40 MG IV SOLR
40.0000 mg | Freq: Once | INTRAVENOUS | Status: AC
  Administered 2014-02-05: 40 mg via INTRAVENOUS
  Filled 2014-02-05: qty 40

## 2014-02-05 MED ORDER — ONDANSETRON HCL 4 MG/2ML IJ SOLN: 4.0000 mg | Freq: Once | INTRAMUSCULAR | Status: DC

## 2014-02-05 MED ORDER — SODIUM CHLORIDE 0.9 % IV BOLUS (SEPSIS): 1000.0000 mL | Freq: Once | INTRAVENOUS | Status: DC

## 2014-02-05 MED ORDER — NITROGLYCERIN 0.4 MG SL SUBL
0.4000 mg | SUBLINGUAL_TABLET | SUBLINGUAL | Status: DC | PRN
  Filled 2014-02-05: qty 1

## 2014-02-05 MED ORDER — LORAZEPAM 2 MG/ML IJ SOLN
1.0000 mg | Freq: Once | INTRAMUSCULAR | Status: AC
  Administered 2014-02-05: 1 mg via INTRAVENOUS
  Filled 2014-02-05: qty 1

## 2014-02-05 NOTE — ED Provider Notes (Signed)
Date: 02/05/2014  Rate: 97  Rhythm: normal sinus rhythm  QRS Axis: normal  Intervals: normal  ST/T Wave abnormalities: nonspecific ST changes  Conduction Disutrbances:none  Narrative Interpretation:   Old EKG Reviewed: unchanged    Ezequiel Essex, MD 02/05/14 2124

## 2014-02-05 NOTE — ED Notes (Addendum)
Pt screaming and yelling that her chest hurts, pt taking gown off and rolling in the bed. RN attempted to give pt nitroglycerin but pt refused. RN attempted to reposition pt but pt continued to roll over in bed. HR 94, VSS.

## 2014-02-05 NOTE — ED Notes (Signed)
Patient presents to ED via [facility]. Patient called EMS c/o chest pressure under her left breast-denies any radiation. Denies any nausea/vomiting or shortness of breath. Patient states that the pain "comes and goes." Patient is A&Ox4. No acute distress noted at this time. Pt admits to ETOH today.

## 2014-02-05 NOTE — ED Provider Notes (Signed)
CSN: [id number]     Arrival date & time 02/05/14  2055 History   First MD Initiated Contact with Patient 02/05/14 2106     Chief Complaint  Patient presents with  . Chest Pain     (Consider location/radiation/quality/duration/timing/severity/associated sxs/prior Treatment) HPI Comments: Patient is a 53 yo F PMHx significant for depression, arthritis presenting to the ED for two months of left sided non-radiating chest pain located under her breast. Describes her pain as sharp in nature. Patient states the pain waxes and wanes in severity, but has been present at some level of pain over the last 2 months. She states she has had an episode of severe pain that began early this morning and has continued since then. She denies any nausea, vomiting, shortness of breath, diaphoresis. She denies any history of chest pain. Patient endorses drinking "a lot of gin tonight." Patient is a level V caveat d/t ETOH intoxication.   Patient is a 53 y.o. female presenting with chest pain.  Chest Pain   Past Medical History  Diagnosis Date  . Abnormal uterine bleeding   . Depression   . Arthritis   . Rectal bleeding     minor    Past Surgical History  Procedure Laterality Date  . Appendectomy    . Oophorectomy    . Shoulder surgery      left shoulder  . Colonoscopy    . 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 Orif periprosthetic fracture Right 05/28/2013    Procedure: OPEN REDUCTION INTERNAL FIXATION (ORIF) PERIPROSTHETIC FRACTURE;  Surgeon: Mauri Pole, MD;  Location: [facility];  Service: Orthopedics;  Laterality: Right;   Family History  Problem Relation Age of Onset  . Diabetes Mother   . Kidney disease Mother   . Heart disease Father   . Colon cancer Father     "father may have had colon cancer"  . Cancer Sister     breast   History   Substance Use Topics  . Smoking status: Current Every Day Smoker -- 0.50 packs/day for 25 years    Types: Cigarettes  . Smokeless tobacco: Never Used     Comment: Tobacco info given to pt. 08/16/12  . Alcohol Use: 1.8 oz/week    3 Shots of liquor per week   OB History   Grav Para Term Preterm Abortions TAB SAB Ect Mult Living   2 1 1  1  1   1      Review of Systems  Unable to perform ROS: Other  Cardiovascular: Positive for chest pain.      Allergies  Codeine and Morphine and related  Home Medications   Prior to Admission medications   Medication Sig Start Date End Date Taking? Authorizing Provider  ALPRAZolam Duanne Moron) 0.5 MG tablet Take 1 tablet (0.5 mg total) by mouth 3 (three) times daily as needed for anxiety. 06/07/13  Yes Daniel J Angiulli, PA-C  aspirin EC 81 MG tablet Take 81 mg by mouth daily.   Yes Historical Provider, MD  calcium carbonate (TUMS - DOSED IN MG ELEMENTAL CALCIUM) 500 MG chewable tablet Chew 1 tablet by mouth daily as needed for indigestion or heartburn.   Yes Historical Provider, MD  Coenzyme Q10 (CO Q 10 PO) Take 1 capsule by mouth daily.    Yes Historical Provider, MD  methocarbamol (ROBAXIN) 500  MG tablet Take 1 tablet (500 mg total) by mouth every 6 (six) hours as needed (muscle spasms). 06/07/13   Lavon Paganini Angiulli, PA-C  Multiple Vitamin (MULTIVITAMIN WITH MINERALS) TABS tablet Take 1 tablet by mouth daily.    Historical Provider, MD  nicotine (NICODERM CQ - DOSED IN MG/24 HOURS) 14 mg/24hr patch 14 mg patch daily x1 week then 7 mg patch daily x3 weeks. 06/07/13   Lavon Paganini Angiulli, PA-C  OxyCODONE (OXYCONTIN) 10 mg T12A 12 hr tablet Take 1 tablet (10 mg total) by mouth every 12 (twelve) hours. 06/07/13   Lavon Paganini Angiulli, PA-C  oxyCODONE-acetaminophen (PERCOCET/ROXICET) 5-325 MG per tablet Take 1-2 tablets by mouth every 4 (four) hours as needed for severe pain. 06/07/13   Lavon Paganini Angiulli, PA-C  ranitidine (ZANTAC) 150 MG tablet Take 1 tablet (150 mg  total) by mouth 2 (two) times daily. 04/12/13   Lucille Passy Babish, PA-C  triamcinolone cream (KENALOG) 0.1 % Apply 1 application topically 2 (two) times daily. 01/17/14   Angelica Chessman, MD  vitamin E 400 UNIT capsule Take 400 Units by mouth daily.    Historical Provider, MD   BP 135/95  Pulse 100  Temp(Src) 98.5 F (36.9 C) (Oral)  Resp 23  Ht 5\' 3"  (1.6 m)  Wt 140 lb (63.504 kg)  BMI 24.81 kg/m2  SpO2 91% Physical Exam  Nursing note and vitals reviewed. Constitutional: She is oriented to person, place, and time. She appears well-developed and well-nourished. No distress.  HENT:  Head: Normocephalic and atraumatic.  Right Ear: External ear normal.  Left Ear: External ear normal.  Nose: Nose normal.  Mouth/Throat: Oropharynx is clear and moist.  Eyes: Conjunctivae are normal.  Neck: Normal range of motion. Neck supple.  Cardiovascular: Normal rate, regular rhythm and normal heart sounds.   Pulmonary/Chest: Effort normal and breath sounds normal.  Abdominal: Soft. There is no tenderness.  Musculoskeletal: Normal range of motion.  Neurological: She is alert and oriented to person, place, and time.  Moves all extremities w/o ataxia.   Skin: Skin is warm and dry. No rash noted. She is not diaphoretic.  Psychiatric: She has a normal mood and affect. Her speech is slurred.    ED Course  Procedures (including critical care time) Medications  nitroGLYCERIN (NITROSTAT) SL tablet 0.4 mg (0.4 mg Sublingual Not Given 02/05/14 2132)  sodium chloride 0.9 % bolus 1,000 mL (not administered)  sodium chloride 0.9 % bolus 1,000 mL (0 mLs Intravenous Stopped 02/05/14 2320)  LORazepam (ATIVAN) injection 1 mg (1 mg Intravenous Given 02/05/14 2207)  sodium chloride 0.9 % bolus 1,000 mL (1,000 mLs Intravenous New Bag/Given 02/05/14 2321)  ondansetron (ZOFRAN) injection 4 mg (4 mg Intravenous Given 02/05/14 2342)  pantoprazole (PROTONIX) injection 40 mg (40 mg Intravenous Given 02/05/14 2346)    Labs  Review Labs Reviewed  CBC - Abnormal; Notable for the following:    RBC 3.59 (*)    MCV 105.8 (*)    MCH 35.7 (*)    All other components within normal limits  COMPREHENSIVE METABOLIC PANEL - Abnormal; Notable for the following:    Sodium 151 (*)    Potassium 3.5 (*)    AST 269 (*)    ALT 217 (*)    Anion gap 23 (*)    All other components within normal limits  ETHANOL - Abnormal; Notable for the following:    Alcohol, Ethyl (B) 349 (*)    All other components within normal limits  URINALYSIS, ROUTINE  W REFLEX MICROSCOPIC - Abnormal; Notable for the following:    Hgb urine dipstick TRACE (*)    Ketones, ur 15 (*)    All other components within normal limits  URINE MICROSCOPIC-ADD ON - Abnormal; Notable for the following:    Bacteria, UA FEW (*)    All other components within normal limits  URINE RAPID DRUG SCREEN (HOSP PERFORMED)  TROPONIN I  I-STAT TROPOININ, ED    Imaging Review Dg Chest 2 View  02/05/2014   CLINICAL DATA:  Chest pain  EXAM: CHEST  2 VIEW  COMPARISON:  Prior radiograph from 05/29/2013  FINDINGS: The cardiac and mediastinal silhouettes are stable in size and contour, and remain within normal limits.  The lungs are normally inflated. No airspace consolidation, pleural effusion, or pulmonary edema is identified. There is no pneumothorax.  No acute osseous abnormality identified. Deformity of the distal left clavicle is stable.  IMPRESSION: No active cardiopulmonary disease.   Electronically Signed   By: Jeannine Boga M.D.   On: 02/05/2014 22:49     EKG Interpretation   Date/Time:  Tuesday February 05 2014 22:47:31 EDT Ventricular Rate:  92 PR Interval:  148 QRS Duration: 86 QT Interval:  388 QTC Calculation: 480 R Axis:   85 Text Interpretation:  Sinus rhythm Artifact Confirmed by Wyvonnia Dusky  MD,  STEPHEN [phone]) on 02/05/2014 11:35:11 PM      MDM   Final diagnoses:  Upper GI bleed  Alcohol intoxication, with unspecified complication    Filed  Vitals:   02/06/14 0000  BP: 135/95  Pulse: 100  Temp:   Resp:     Patient is a 53 year old female past medical history significant for alcohol abuse presented to the emergency department acutely intoxicated complaining of left-sided chest pain. During patient's stay in the emergency department developed nausea, hematemesis. This is likely secondary to chronic alcohol use. Vital signs are otherwise stable. Hemoglobin and hematocrit are stable. Vomiting has resolved after IV Zofran administration. Patient has been treated with IV fluids and Protonix. Will admit patient to the hospital for further evaluation and management. Patient d/w with Dr. Wyvonnia Dusky, agrees with plan.     [place], PA-C 02/06/14 0041

## 2014-02-06 ENCOUNTER — Encounter (HOSPITAL_COMMUNITY): Payer: Self-pay | Admitting: General Practice

## 2014-02-06 ENCOUNTER — Inpatient Hospital Stay (HOSPITAL_COMMUNITY): Payer: Medicaid Other

## 2014-02-06 ENCOUNTER — Encounter (HOSPITAL_COMMUNITY)
Admission: EM | Disposition: A | Discharge status: Home / Self Care | Payer: Self-pay | Source: Home / Self Care | Attending: Internal Medicine

## 2014-02-06 DIAGNOSIS — F329 Major depressive disorder, single episode, unspecified: Secondary | ICD-10-CM | POA: Diagnosis present

## 2014-02-06 DIAGNOSIS — K922 Gastrointestinal hemorrhage, unspecified: Secondary | ICD-10-CM

## 2014-02-06 DIAGNOSIS — Z96649 Presence of unspecified artificial hip joint: Secondary | ICD-10-CM | POA: Diagnosis not present

## 2014-02-06 DIAGNOSIS — F10929 Alcohol use, unspecified with intoxication, unspecified: Secondary | ICD-10-CM

## 2014-02-06 DIAGNOSIS — F1020 Alcohol dependence, uncomplicated: Secondary | ICD-10-CM | POA: Diagnosis present

## 2014-02-06 DIAGNOSIS — F1010 Alcohol abuse, uncomplicated: Secondary | ICD-10-CM

## 2014-02-06 DIAGNOSIS — F3289 Other specified depressive episodes: Secondary | ICD-10-CM | POA: Diagnosis present

## 2014-02-06 DIAGNOSIS — G894 Chronic pain syndrome: Secondary | ICD-10-CM | POA: Diagnosis present

## 2014-02-06 DIAGNOSIS — K219 Gastro-esophageal reflux disease without esophagitis: Secondary | ICD-10-CM | POA: Diagnosis present

## 2014-02-06 DIAGNOSIS — B3749 Other urogenital candidiasis: Secondary | ICD-10-CM | POA: Diagnosis present

## 2014-02-06 DIAGNOSIS — R079 Chest pain, unspecified: Secondary | ICD-10-CM

## 2014-02-06 DIAGNOSIS — M25559 Pain in unspecified hip: Secondary | ICD-10-CM | POA: Diagnosis present

## 2014-02-06 DIAGNOSIS — E876 Hypokalemia: Secondary | ICD-10-CM | POA: Diagnosis present

## 2014-02-06 DIAGNOSIS — E870 Hyperosmolality and hypernatremia: Secondary | ICD-10-CM | POA: Diagnosis present

## 2014-02-06 DIAGNOSIS — D649 Anemia, unspecified: Secondary | ICD-10-CM | POA: Diagnosis present

## 2014-02-06 DIAGNOSIS — D62 Acute posthemorrhagic anemia: Secondary | ICD-10-CM

## 2014-02-06 DIAGNOSIS — F17200 Nicotine dependence, unspecified, uncomplicated: Secondary | ICD-10-CM | POA: Diagnosis present

## 2014-02-06 DIAGNOSIS — Z7982 Long term (current) use of aspirin: Secondary | ICD-10-CM | POA: Diagnosis not present

## 2014-02-06 DIAGNOSIS — K2961 Other gastritis with bleeding: Secondary | ICD-10-CM | POA: Diagnosis present

## 2014-02-06 DIAGNOSIS — K208 Other esophagitis without bleeding: Secondary | ICD-10-CM | POA: Diagnosis present

## 2014-02-06 DIAGNOSIS — Z791 Long term (current) use of non-steroidal anti-inflammatories (NSAID): Secondary | ICD-10-CM | POA: Diagnosis not present

## 2014-02-06 DIAGNOSIS — I10 Essential (primary) hypertension: Secondary | ICD-10-CM | POA: Diagnosis present

## 2014-02-06 DIAGNOSIS — M129 Arthropathy, unspecified: Secondary | ICD-10-CM | POA: Diagnosis present

## 2014-02-06 HISTORY — PX: ESOPHAGOGASTRODUODENOSCOPY: SHX5428

## 2014-02-06 LAB — CBC
HCT: 34.2 % — ABNORMAL LOW (ref 36.0–46.0)
HEMOGLOBIN: 11.2 g/dL — AB (ref 12.0–15.0)
MCH: 34.9 pg — ABNORMAL HIGH (ref 26.0–34.0)
MCHC: 32.7 g/dL (ref 30.0–36.0)
MCV: 106.5 fL — AB (ref 78.0–100.0)
Platelets: 225 10*3/uL (ref 150–400)
RBC: 3.21 MIL/uL — AB (ref 3.87–5.11)
RDW: 13.6 % (ref 11.5–15.5)
WBC: 6.4 10*3/uL (ref 4.0–10.5)

## 2014-02-06 LAB — TROPONIN I
Troponin I: <0.3 ng/mL (ref <0.30)
Troponin I: <0.3 ng/mL (ref <0.30)

## 2014-02-06 LAB — BASIC METABOLIC PANEL
Anion gap: 21 — ABNORMAL HIGH (ref 5–15)
BUN: 9 mg/dL (ref 6–23)
CHLORIDE: 103 meq/L (ref 96–112)
CO2: 19 mEq/L (ref 19–32)
Calcium: 8.1 mg/dL — ABNORMAL LOW (ref 8.4–10.5)
Creatinine, Ser: 0.49 mg/dL — ABNORMAL LOW (ref 0.50–1.10)
GFR calc Af Amer: >90 mL/min (ref >90)
GFR calc non Af Amer: >90 mL/min (ref >90)
GLUCOSE: 64 mg/dL — AB (ref 70–99)
POTASSIUM: 3.5 meq/L — AB (ref 3.7–5.3)
Sodium: 143 mEq/L (ref 137–147)

## 2014-02-06 LAB — HEMOGLOBIN AND HEMATOCRIT, BLOOD
HCT: 35.3 % — ABNORMAL LOW (ref 36.0–46.0)
HCT: 35.9 % — ABNORMAL LOW (ref 36.0–46.0)
Hemoglobin: 11.6 g/dL — ABNORMAL LOW (ref 12.0–15.0)
Hemoglobin: 12 g/dL (ref 12.0–15.0)

## 2014-02-06 LAB — TYPE AND SCREEN
ABO/RH(D): A POS
Antibody Screen: NEGATIVE

## 2014-02-06 SURGERY — EGD (ESOPHAGOGASTRODUODENOSCOPY)
Anesthesia: Moderate Sedation

## 2014-02-06 MED ORDER — HYDRALAZINE HCL 20 MG/ML IJ SOLN
10.0000 mg | INTRAMUSCULAR | Status: DC | PRN
  Administered 2014-02-07: 10 mg via INTRAVENOUS
  Filled 2014-02-06: qty 1

## 2014-02-06 MED ORDER — SODIUM CHLORIDE 0.9 % IV SOLN
INTRAVENOUS | Status: DC
  Administered 2014-02-06: 500 mL via INTRAVENOUS

## 2014-02-06 MED ORDER — FLUCONAZOLE 150 MG PO TABS
150.0000 mg | ORAL_TABLET | Freq: Once | ORAL | Status: AC
  Administered 2014-02-06: 150 mg via ORAL
  Filled 2014-02-06: qty 1

## 2014-02-06 MED ORDER — OXYCODONE HCL ER 10 MG PO T12A
10.0000 mg | EXTENDED_RELEASE_TABLET | Freq: Two times a day (BID) | ORAL | Status: DC
  Administered 2014-02-06 – 2014-02-07 (×3): 10 mg via ORAL
  Filled 2014-02-06 (×3): qty 1

## 2014-02-06 MED ORDER — SODIUM CHLORIDE 0.9 % IV SOLN
8.0000 mg/h | INTRAVENOUS | Status: DC
  Administered 2014-02-06: 8 mg/h via INTRAVENOUS
  Filled 2014-02-06 (×4): qty 80

## 2014-02-06 MED ORDER — SODIUM CHLORIDE 0.9 % IV SOLN
INTRAVENOUS | Status: DC
  Administered 2014-02-06 – 2014-02-07 (×3): via INTRAVENOUS

## 2014-02-06 MED ORDER — ALUM & MAG HYDROXIDE-SIMETH 200-200-20 MG/5ML PO SUSP
30.0000 mL | Freq: Four times a day (QID) | ORAL | Status: DC | PRN
Start: 2014-02-06 — End: 2014-02-07

## 2014-02-06 MED ORDER — ACETAMINOPHEN 325 MG PO TABS: 650.0000 mg | ORAL_TABLET | Freq: Four times a day (QID) | ORAL | Status: DC | PRN

## 2014-02-06 MED ORDER — MIDAZOLAM HCL 5 MG/ML IJ SOLN
INTRAMUSCULAR | Status: AC
  Filled 2014-02-06: qty 2

## 2014-02-06 MED ORDER — LORAZEPAM 1 MG PO TABS
1.0000 mg | ORAL_TABLET | Freq: Four times a day (QID) | ORAL | Status: DC | PRN
  Filled 2014-02-06: qty 1

## 2014-02-06 MED ORDER — BUTAMBEN-TETRACAINE-BENZOCAINE 2-2-14 % EX AERO
INHALATION_SPRAY | CUTANEOUS | Status: DC | PRN
  Administered 2014-02-06: 2 via TOPICAL

## 2014-02-06 MED ORDER — FENTANYL CITRATE 0.05 MG/ML IJ SOLN
INTRAMUSCULAR | Status: AC
  Filled 2014-02-06: qty 2

## 2014-02-06 MED ORDER — MIDAZOLAM HCL 10 MG/2ML IJ SOLN
INTRAMUSCULAR | Status: DC | PRN
  Administered 2014-02-06 (×2): 2 mg via INTRAVENOUS

## 2014-02-06 MED ORDER — DIPHENHYDRAMINE HCL 50 MG/ML IJ SOLN
INTRAMUSCULAR | Status: DC | PRN
  Administered 2014-02-06: 25 mg via INTRAVENOUS

## 2014-02-06 MED ORDER — FENTANYL CITRATE 0.05 MG/ML IJ SOLN
INTRAMUSCULAR | Status: DC | PRN
  Administered 2014-02-06 (×2): 25 ug via INTRAVENOUS

## 2014-02-06 MED ORDER — ACETAMINOPHEN 650 MG RE SUPP: 650.0000 mg | Freq: Four times a day (QID) | RECTAL | Status: DC | PRN

## 2014-02-06 MED ORDER — PANTOPRAZOLE SODIUM 40 MG PO TBEC
40.0000 mg | DELAYED_RELEASE_TABLET | Freq: Two times a day (BID) | ORAL | Status: DC
  Administered 2014-02-06 – 2014-02-07 (×2): 40 mg via ORAL
  Filled 2014-02-06 (×2): qty 1

## 2014-02-06 MED ORDER — LORAZEPAM 2 MG/ML IJ SOLN: 1.0000 mg | Freq: Four times a day (QID) | INTRAMUSCULAR | Status: DC | PRN

## 2014-02-06 MED ORDER — ADULT MULTIVITAMIN W/MINERALS CH
1.0000 | ORAL_TABLET | Freq: Every day | ORAL | Status: DC
  Administered 2014-02-06 – 2014-02-07 (×2): 1 via ORAL
  Filled 2014-02-06 (×2): qty 1

## 2014-02-06 MED ORDER — OXYCODONE-ACETAMINOPHEN 5-325 MG PO TABS
1.0000 | ORAL_TABLET | ORAL | Status: DC | PRN
  Administered 2014-02-06 – 2014-02-07 (×2): 2 via ORAL
  Administered 2014-02-07: 1 via ORAL
  Filled 2014-02-06 (×3): qty 2
  Filled 2014-02-06: qty 1

## 2014-02-06 MED ORDER — LORAZEPAM 2 MG/ML IJ SOLN: 0.0000 mg | Freq: Four times a day (QID) | INTRAMUSCULAR | Status: DC

## 2014-02-06 MED ORDER — HYDROMORPHONE HCL PF 1 MG/ML IJ SOLN
0.5000 mg | INTRAMUSCULAR | Status: DC | PRN
  Administered 2014-02-06 (×2): 1 mg via INTRAVENOUS
  Filled 2014-02-06 (×2): qty 1

## 2014-02-06 MED ORDER — PANTOPRAZOLE SODIUM 40 MG IV SOLR: 40.0000 mg | Freq: Two times a day (BID) | INTRAVENOUS | Status: DC

## 2014-02-06 MED ORDER — FOLIC ACID 1 MG PO TABS
1.0000 mg | ORAL_TABLET | Freq: Every day | ORAL | Status: DC
  Administered 2014-02-06 – 2014-02-07 (×2): 1 mg via ORAL
  Filled 2014-02-06 (×2): qty 1

## 2014-02-06 MED ORDER — THIAMINE HCL 100 MG/ML IJ SOLN
100.0000 mg | Freq: Every day | INTRAMUSCULAR | Status: DC
Start: 2014-02-06 — End: 2014-02-07
  Filled 2014-02-06 (×2): qty 1

## 2014-02-06 MED ORDER — VITAMIN B-1 100 MG PO TABS
100.0000 mg | ORAL_TABLET | Freq: Every day | ORAL | Status: DC
  Administered 2014-02-06 – 2014-02-07 (×2): 100 mg via ORAL
  Filled 2014-02-06 (×2): qty 1

## 2014-02-06 MED ORDER — ONDANSETRON HCL 4 MG PO TABS: 4.0000 mg | ORAL_TABLET | Freq: Four times a day (QID) | ORAL | Status: DC | PRN

## 2014-02-06 MED ORDER — POTASSIUM CHLORIDE CRYS ER 20 MEQ PO TBCR
40.0000 meq | EXTENDED_RELEASE_TABLET | Freq: Once | ORAL | Status: AC
  Administered 2014-02-06: 40 meq via ORAL
  Filled 2014-02-06: qty 2

## 2014-02-06 MED ORDER — SODIUM CHLORIDE 0.9 % IV SOLN
80.0000 mg | Freq: Once | INTRAVENOUS | Status: AC
  Administered 2014-02-06: 80 mg via INTRAVENOUS
  Filled 2014-02-06: qty 80

## 2014-02-06 MED ORDER — DIPHENHYDRAMINE HCL 50 MG/ML IJ SOLN
INTRAMUSCULAR | Status: AC
  Filled 2014-02-06: qty 1

## 2014-02-06 MED ORDER — LORAZEPAM 2 MG/ML IJ SOLN: 0.0000 mg | Freq: Two times a day (BID) | INTRAMUSCULAR | Status: DC

## 2014-02-06 MED ORDER — ONDANSETRON HCL 4 MG/2ML IJ SOLN: 4.0000 mg | Freq: Four times a day (QID) | INTRAMUSCULAR | Status: DC | PRN

## 2014-02-06 NOTE — Progress Notes (Signed)
Received report from Holly, RN.

## 2014-02-06 NOTE — Consult Note (Signed)
Referring Provider: [facility]istPrimary Care Physician:  No PCP Per Patient Primary Gastroenterologist:  Dr [name].  Reason for Consultation:  Chest pain and hematemesis  HPI: Jaime Phillips is a 53 y.o. female  Known to Dr. [name]  From colonoscopy done 08/2012 from c/o rectal bleeding- this showed diverticulosis, internal hemorrhoids and 2 polyps removed which  were hyperplastic. Pt with hx of hip fracture 10/14  and has had chronic pain since. She has been taking Aleve up to 6 per day,aspirin, advil, and says she is drinking alcohol daily to help with the pain as well- drinking a "cup"' per day . Pt came to the ER yesterday  With c/o chest pain which she had had off and on for 2 weeks and was worsened yesterday left chest-worse with movement and deep breathing-sharp in nature. After had been in ER became nauseated and vomited  x 2 with dark blood.  She says she has vomited small amts of blood at home before over the past months but this was significantly more. She says she has had some trouble with Dysphagia, feeling of food sticking in esophagus recently also, no odynophagia. No prior EGD, One very  dark stool yesterday, none since. No abdominal pain.  HGb 12.8 on admit , down to 11.7 today No lab evidence for cirrhosis, but has transaminitis c/w ETOH hepatitis.   Past Medical History  Diagnosis Date  . Abnormal uterine bleeding   . Depression   . Arthritis   . Rectal bleeding     minor   . [id number](381.8)     Past Surgical History  Procedure Laterality Date  . Appendectomy    . Oophorectomy    . Shoulder surgery      left shoulder  . Colonoscopy    . 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 Orif periprosthetic fracture Right 05/28/2013    Procedure: OPEN REDUCTION INTERNAL FIXATION (ORIF) PERIPROSTHETIC FRACTURE;   Surgeon: Mauri Pole, MD;  Location: [facility];  Service: Orthopedics;  Laterality: Right;    Prior to Admission medications   Medication Sig Start Date End Date Taking? Authorizing Provider  ALPRAZolam Duanne Moron) 0.5 MG tablet Take 1 tablet (0.5 mg total) by mouth 3 (three) times daily as needed for anxiety. 06/07/13  Yes Kenard Morawski J Angiulli, PA-C  aspirin EC 81 MG tablet Take 81 mg by mouth daily.   Yes Historical Provider, MD  calcium carbonate (TUMS - DOSED IN MG ELEMENTAL CALCIUM) 500 MG chewable tablet Chew 1 tablet by mouth daily as needed for indigestion or heartburn.   Yes Historical Provider, MD  Coenzyme Q10 (CO Q 10 PO) Take 1 capsule by mouth daily.    Yes Historical Provider, MD  methocarbamol (ROBAXIN) 500 MG tablet Take 1 tablet (500 mg total) by mouth every 6 (six) hours as needed (muscle spasms). 06/07/13   Lavon Paganini Angiulli, PA-C  Multiple Vitamin (MULTIVITAMIN WITH MINERALS) TABS tablet Take 1 tablet by mouth daily.    Historical Provider, MD  nicotine (NICODERM CQ - DOSED IN MG/24 HOURS) 14 mg/24hr patch 14 mg patch daily x1 week then 7 mg patch daily x3 weeks. 06/07/13   Lavon Paganini Angiulli, PA-C  OxyCODONE (OXYCONTIN) 10 mg T12A 12 hr tablet Take 1 tablet (10 mg total) by mouth every 12 (twelve) hours. 06/07/13   Lavon Paganini Angiulli, PA-C  oxyCODONE-acetaminophen (PERCOCET/ROXICET)  5-325 MG per tablet Take 1-2 tablets by mouth every 4 (four) hours as needed for severe pain. 06/07/13   Lavon Paganini Angiulli, PA-C  ranitidine (ZANTAC) 150 MG tablet Take 1 tablet (150 mg total) by mouth 2 (two) times daily. 04/12/13   Lucille Passy Babish, PA-C  triamcinolone cream (KENALOG) 0.1 % Apply 1 application topically 2 (two) times daily. 01/17/14   Angelica Chessman, MD  vitamin E 400 UNIT capsule Take 400 Units by mouth daily.    Historical Provider, MD    Current Facility-Administered Medications  Medication Dose Route Frequency Provider Last Rate Last Dose  . 0.9 %  sodium chloride infusion    Intravenous Continuous Theressa Millard, MD 75 mL/hr at 02/06/14 0151    . acetaminophen (TYLENOL) tablet 650 mg  650 mg Oral Q6H PRN Theressa Millard, MD       Or  . acetaminophen (TYLENOL) suppository 650 mg  650 mg Rectal Q6H PRN Theressa Millard, MD      . alum & mag hydroxide-simeth (MAALOX/MYLANTA) 200-200-20 MG/5ML suspension 30 mL  30 mL Oral Q6H PRN Theressa Millard, MD      . folic acid (FOLVITE) tablet 1 mg  1 mg Oral Daily Theressa Millard, MD   1 mg at 02/06/14 1002  . HYDROmorphone (DILAUDID) injection 0.5-1 mg  0.5-1 mg Intravenous Q3H PRN Theressa Millard, MD   1 mg at 02/06/14 2683  . LORazepam (ATIVAN) injection 0-4 mg  0-4 mg Intravenous 4 times per day Theressa Millard, MD       Followed by  . [START ON 02/08/2014] LORazepam (ATIVAN) injection 0-4 mg  0-4 mg Intravenous Q12H Harvette C Jenkins, MD      . LORazepam (ATIVAN) tablet 1 mg  1 mg Oral Q6H PRN Theressa Millard, MD       Or  . LORazepam (ATIVAN) injection 1 mg  1 mg Intravenous Q6H PRN Theressa Millard, MD      . multivitamin with minerals tablet 1 tablet  1 tablet Oral Daily Theressa Millard, MD   1 tablet at 02/06/14 1002  . nitroGLYCERIN (NITROSTAT) SL tablet 0.4 mg  0.4 mg Sublingual Q5 min PRN Babette Relic, MD      . ondansetron [facility]) tablet 4 mg  4 mg Oral Q6H PRN Theressa Millard, MD       Or  . ondansetron (ZOFRAN) injection 4 mg  4 mg Intravenous Q6H PRN Theressa Millard, MD      . OxyCODONE (OXYCONTIN) 12 hr tablet 10 mg  10 mg Oral Q12H Theressa Millard, MD   10 mg at 02/06/14 1002  . oxyCODONE-acetaminophen (PERCOCET/ROXICET) 5-325 MG per tablet 1-2 tablet  1-2 tablet Oral Q4H PRN Theressa Millard, MD      . pantoprazole (PROTONIX) 80 mg in sodium chloride 0.9 % 250 mL infusion  8 mg/hr Intravenous Continuous Theressa Millard, MD 25 mL/hr at 02/06/14 0215 8 mg/hr at 02/06/14 0215  . [START ON 02/09/2014] pantoprazole (PROTONIX) injection 40 mg  40 mg Intravenous Q12H  Harvette C Jenkins, MD      . sodium chloride 0.9 % bolus 1,000 mL  1,000 mL Intravenous Once Ezequiel Essex, MD      . thiamine (VITAMIN B-1) tablet 100 mg  100 mg Oral Daily Theressa Millard, MD   100 mg at 02/06/14 1002   Or  . thiamine (B-1) injection 100 mg  100 mg Intravenous Daily  Theressa Millard, MD        Allergies as of 02/05/2014 - Review Complete 02/05/2014  Allergen Reaction Noted  . Codeine Other (See Comments) 07/19/2012  . Morphine and related  08/16/2012    Family History  Problem Relation Age of Onset  . Diabetes Mother   . Kidney disease Mother   . Heart disease Father   . Colon cancer Father     "father may have had colon cancer"  . Cancer Sister     breast    History   Social History  . Marital Status: Single    Spouse Name: N/A    Number of Children: 1  . Years of Education: N/A   Occupational History  . disabled    Social History Main Topics  . Smoking status: Current Every Day Smoker -- 0.50 packs/day for 25 years    Types: Cigarettes  . Smokeless tobacco: Never Used     Comment: Tobacco info given to pt. 08/16/12  . Alcohol Use: 1.8 oz/week    3 Shots of liquor per week  . Drug Use: No  . Sexual Activity: Not Currently    Birth Control/ Protection: Injection   Other Topics Concern  . Not on file   Social History Narrative  . No narrative on file    drinking at least one cup of alcohol daily as of 7/15  Review of Systems: Pertinent positive and negative review of systems were noted in the above HPI section.  All other review of systems was otherwise negative.  Physical Exam: Vital signs in last 24 hours: Temp:  [98.4 F (36.9 C)-98.9 F (37.2 C)] 98.4 F (36.9 C) (07/08 0756) Pulse Rate:  [85-102] 85 (07/08 0756) Resp:  [18-23] 20 (07/08 0756) BP: (104-163)/(66-95) 154/81 mmHg (07/08 0756) SpO2:  [91 %-98 %] 92 % (07/08 0756) Weight:  [140 lb (63.504 kg)-140 lb 4.8 oz (63.64 kg)] 140 lb 4.8 oz (63.64 kg) (07/08  0107) Last BM Date: 02/05/14 General:   Alert,  Well-developed, AA female well-nourished, pleasant and cooperative in NAD Head:  Normocephalic and atraumatic. Eyes:  Sclera clear, no icterus.   Conjunctiva pink. Ears:  Normal auditory acuity. Nose:  No deformity, discharge,  or lesions. Mouth:  No deformity or lesions.   Neck:  Supple; no masses or thyromegaly. Lungs:  Clear throughout to auscultation.   No wheezes, crackles, or rhonchi. She is tender to palpation of left chest wall under left breast over rib margin Heart:  Regular rate and rhythm; no murmurs, clicks, rubs,  or gallops. Abdomen:  Soft,nontender, BS active,nonpalp mass or hsm.   Rectal:  Deferred  Msk:  Symmetrical without gross deformities. . Pulses:  Normal pulses noted. Extremities:  Without clubbing or edema. Neurologic:  Alert and  oriented x4;  grossly normal neurologically. Skin:  Intact without significant lesions or rashes.. Psych:  Alert and cooperative. Normal mood and affect.  Intake/Output from previous day: 07/07 0701 - 07/08 0700 In: 1000 [IV Piggyback:1000] Out: -  Intake/Output this shift:    Lab Results:  Recent Labs  02/05/14 2132 02/06/14 0230 02/06/14 0753  WBC 6.9  --  6.4  HGB 12.8 11.6* 11.2*  HCT 38.0 35.3* 34.2*  PLT 274  --  225   BMET  Recent Labs  02/05/14 2132 02/06/14 0753  NA 151* 143  K 3.5* 3.5*  CL 106 103  CO2 22 19  GLUCOSE 76 64*  BUN 14 9  CREATININE 0.55 0.49*  CALCIUM  9.0 8.1*   LFT  Recent Labs  02/05/14 2132  PROT 7.2  ALBUMIN 4.2  AST 269*  ALT 217*  ALKPHOS 102  BILITOT 0.3    IMPRESSION:  #1  53 yo female with chest pain- suspect muscukuloskeletal #2 hematemesis- R/O erosive esophagitis, Nsaid nduced PUD, MW tear, neoplasm #3 anemia -secondary to above #4 chronic hip pain post fracture 10/14 #5  ETOH abuse #6 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 Essie Christine in detail  with pt #2 serial hgbs BID PPI #3 Hep B and Hep C serology #4 defer workup of chest pain? Rib details  To primary team #5 stop ETOH and NSAID abuse   Amy Esterwood  02/06/2014, 10:34 AM     ________________________________________________________________________  Velora Heckler GI MD note:  I personally examined the patient, reviewed the data and agree with the assessment and plan described above. Possible NSAID related UGI bleeding.  Planning on EGD today.   Owens Loffler, MD [facility] Pager [phone]

## 2014-02-06 NOTE — Progress Notes (Signed)
Utilization review completed.  

## 2014-02-06 NOTE — Progress Notes (Signed)
PROGRESS NOTE  Jaime Phillips RKY:[id number] DOB: Mar 22, 1961 DOA: 02/05/2014 PCP: No PCP Per Patient  Brief history     Jaime Phillips is a 53 y.o. female with a history of Alcoholism and Chronic Pain presented to ED on 02/05/14 with complaints of chest pain intermittent x 2 weeks. Shortly after arrival to ED, pt began experiencing hematemesis of dark red blood. She states this has occurred before with small amounts of blood, but she has not followed-up. She did have a colonoscopy done 08/2012 ([facility]) showing diverticulosis and internal hemorrhoids. Reports drinking 3 airplane bottles of gin/day because of chronic left hip pain. She has been taking Aleve (6 tabs daily) for hip pain.   Assessment/Plan:  Hematemesis secondary to ulcerative esophagitis/gastritis -appreciate GI assisstance. No recurrent episodes since ED -s/p EGD 02/06/14 -BID PPI, h. Pylori pending -no NSAIDs  Transaminitis, mild -mild elevation of AST/ALT -with hx etoh abuse check hepatitis panel vs acute illness  ETOH abuse -no s/sx's withdrawal at present -CIWA protocol  Anemia, mild -secondary to above -hgb 12 this am. No need to transfuse. Monitor  Hypernatremia -a/w increased urine SG likely d/t dehydration -improved this am. Monitor  Yeast UTI -Diflucan  Hypokalemia, mild Replete po  Right hip pain -pt states has been workup up in the past with significant arthritis. Will check plain film given significant pain -is awaiting medicaid approval to follow-up with ortho -continue oxy IR and percocet for now  DVT Prophylaxis:  SCDs  Code Status: full code  Disposition Plan: advance diet. Possible d/c 7/8  Consultants:  GI  Procedures:  Upper endoscopy 02/06/14  Antibiotics:  none  Objective: Filed Vitals:   02/06/14 1350 02/06/14 1400 02/06/14 1410 02/06/14 1455  BP: 141/90 159/84 172/89 159/84  Pulse: 71 80 79 77  Temp:    98.6 F (37 C)  TempSrc:    Oral  Resp: 22 17 13 15   Height:       Weight:      SpO2: 98% 95% 94% 97%    Intake/Output Summary (Last 24 hours) at 02/06/14 1507 Last data filed at 02/06/14 1453  Gross per 24 hour  Intake 1841.25 ml  Output      0 ml  Net 1841.25 ml   Filed Weights   02/05/14 2106 02/06/14 0107  Weight: 63.504 kg (140 lb) 63.64 kg (140 lb 4.8 oz)    Exam: General: awake, alert in NAD CV: S1S2 RRR, no m/r/g Resp: CTAB, no w/r/c, no increased wob GI: soft, ND, mild tenderness to LUQ without rebound or guarding Msk: no obvious deformities Ext: no clubbing, cyanosis, or edema Psych: AOx3, pleasant affect  Data Reviewed: Basic Metabolic Panel:  Recent Labs Lab 02/05/14 2132 02/06/14 0753  NA 151* 143  K 3.5* 3.5*  CL 106 103  CO2 22 19  GLUCOSE 76 64*  BUN 14 9  CREATININE 0.55 0.49*  CALCIUM 9.0 8.1*   Liver Function Tests:  Recent Labs Lab 02/05/14 2132  AST 269*  ALT 217*  ALKPHOS 102  BILITOT 0.3  PROT 7.2  ALBUMIN 4.2   No results found for this basename: LIPASE, AMYLASE,  in the last 168 hours No results found for this basename: AMMONIA,  in the last 168 hours CBC:  Recent Labs Lab 02/05/14 2132 02/06/14 0230 02/06/14 0753 02/06/14 1159  WBC 6.9  --  6.4  --   HGB 12.8 11.6* 11.2* 12.0  HCT 38.0 35.3* 34.2* 35.9*  MCV 105.8*  --  106.5*  --  PLT 274  --  225  --    Cardiac Enzymes:  Recent Labs Lab 02/05/14 2132 02/06/14 0146 02/06/14 0753  TROPONINI <0.30 <0.30 <0.30   BNP (last 3 results) No results found for this basename: PROBNP,  in the last 8760 hours CBG: No results found for this basename: GLUCAP,  in the last 168 hours  No results found for this or any previous visit (from the past 240 hour(s)).   Studies: Dg Chest 2 View  02/05/2014   CLINICAL DATA:  Chest pain  EXAM: CHEST  2 VIEW  COMPARISON:  Prior radiograph from 05/29/2013  FINDINGS: The cardiac and mediastinal silhouettes are stable in size and contour, and remain within normal limits.  The lungs are normally  inflated. No airspace consolidation, pleural effusion, or pulmonary edema is identified. There is no pneumothorax.  No acute osseous abnormality identified. Deformity of the distal left clavicle is stable.  IMPRESSION: No active cardiopulmonary disease.   Electronically Signed   By: Jeannine Boga M.D.   On: 02/05/2014 22:49    Scheduled Meds: . folic acid  1 mg Oral Daily  . LORazepam  0-4 mg Intravenous 4 times per day   Followed by  . [START ON 02/08/2014] LORazepam  0-4 mg Intravenous Q12H  . multivitamin with minerals  1 tablet Oral Daily  . OxyCODONE  10 mg Oral Q12H  . pantoprazole  40 mg Oral BID AC  . sodium chloride  1,000 mL Intravenous Once  . thiamine  100 mg Oral Daily   Or  . thiamine  100 mg Intravenous Daily   Continuous Infusions: . sodium chloride 75 mL/hr at 02/06/14 1448    Principal Problem:   Upper GI bleed Active Problems:   Alcohol intoxication   Hypernatremia   Chest pain   Chronic pain syndrome   Gastro-esophageal reflux    Patrici Ranks, NP-C [facility] [place]  pgr [phone]    If 7PM-7AM, please contact night-coverage at [email or web address], password [facility] 02/06/2014, 3:07 PM  LOS: 1 day

## 2014-02-06 NOTE — Op Note (Signed)
[facility] [place], [id number]   ENDOSCOPY PROCEDURE REPORT  PATIENT: Jaime Phillips, Jaime Phillips  MR#: [id number] BIRTHDATE: [birth year] , 61  yrs. old GENDER: Female ENDOSCOPIST: Milus Banister, MD PROCEDURE DATE:  02/06/2014 PROCEDURE:  EGD w/ biopsy ASA CLASS:     Class III INDICATIONS:  hematemesis MEDICATIONS: Fentanyl 50 mcg IV, Versed 4 mg IV, and Diphenhydramine (Benadryl) 25 mg IV TOPICAL ANESTHETIC: Cetacaine Spray  DESCRIPTION OF PROCEDURE: After the risks benefits and alternatives of the procedure were thoroughly explained, informed consent was obtained.  The Pentax Gastroscope [id number] endoscope was introduced through the mouth and advanced to the second portion of the duodenum. Without limitations.  The instrument was slowly withdrawn as the mucosa was fully examined.     There were several spokes of ulcerative, reflux related esophagitis at the GE junction.  One of these had an adherent small blood clot. There was no other recent or old blood in the UGI tract.  There was mild, non-specific distal gastritis that was biopsied and sent to pathology.  The examination was otherwise normal.  Retroflexed views revealed no abnormalities.     The scope was then withdrawn from the patient and the procedure completed.  COMPLICATIONS: There were no complications. ENDOSCOPIC IMPRESSION: There were several spokes of ulcerative, reflux related esophagitis at the GE junction.  One of these had an adherent small blood clot. There was no other recent or old blood in the UGI tract.  There was mild, non-specific distal gastritis that was biopsied and sent to pathology.  The examination was otherwise normal.  RECOMMENDATIONS: Will change from ranitidine to PPI.  Will start twice daily PPI, orally.  Best taken 20 to 30 min prior to BF and dinner meals.  You can decrease to once daily PPI (protonix) after one month.  If biopsies show H.  pylori,  you will be started on appropriate antibiotics.  You should significantly cut back or completely stop taking NSAID (such as alleve, advil).  Will advance diet and if no sign of recurrent GI bleeding, OK to discharge home tomorrow.   eSigned:  Milus Banister, MD 02/06/2014 1:40 PM

## 2014-02-06 NOTE — H&P (Signed)
[facility] History and Physical  Jaime Phillips WCB:[id number] DOB: 12-Dec-1960 DOA: 02/05/2014  Referring physician:  PCP: [facility]:   Chief Complaint: Chest Pain  HPI: Jaime Phillips is a 53 y.o. female with a history of Alcoholism and Chronic Pain who presents to the ED with complaints of severe SSCP that was worse today.   She reports that she has had intermittent chest pain for about 1 year, but was worse than it had ever been tonight.  After sh arrived in the ED she began to have hematemesis of dark red blood.  She reports that she also has occasional episodes of hematemesis but she has not had a medical evalauation in regard to this problem.   She denies ABD pain or diarrhea.  In the ED , her initial hemoglobin level was 12.8, and her Alcohol level was  349.  She reports that she drinks 1 cup of Alcohol  (gin) daily, and has done so for the past year and a half due to help her chronic left hip pain.     Review of Systems:  Constitutional: No Weight Loss, No Weight Gain, Night Sweats, Fevers, Chills, Fatigue, or Generalized Weakness HEENT: No Headaches, Difficulty Swallowing,Tooth/Dental Problems,Sore Throat,  No Sneezing, Rhinitis, Ear Ache, Nasal Congestion, or Post Nasal Drip,  Cardio-vascular:  +Chest pain, Orthopnea, PND, Edema in lower extremities, Anasarca, Dizziness, Palpitations  Resp: No Dyspnea, No DOE, No Productive Cough, No Non-Productive Cough, No Hemoptysis, No Change in Color of Mucus,  No Wheezing.    GI: No Heartburn, Indigestion, Abdominal Pain, Nausea, +Vomiting,+Hematemesis,  Diarrhea, Change in Bowel Habits,  Loss of Appetite  GU: No Dysuria, Change in Color of Urine, No Urgency or Frequency.  No flank pain.  Musculoskeletal: No Joint Pain or Swelling.  No Decreased Range of Motion. No Back Pain.  Neurologic: No Syncope, No Seizures, Muscle Weakness, Paresthesia, Vision Disturbance or Loss, No Diplopia, No Vertigo, No  Difficulty Walking,  Skin: No Rash or Lesions. Psych: No Change in Mood or Affect. No Depression or Anxiety. No Memory loss. No Confusion or Hallucinations   Past Medical History  Diagnosis Date  . Abnormal uterine bleeding   . Depression   . Arthritis   . Rectal bleeding     minor   . OHYWVPXT(062.6)       Past Surgical History  Procedure Laterality Date  . Appendectomy    . Oophorectomy    . Shoulder surgery      left shoulder  . Colonoscopy    . 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 Orif periprosthetic fracture Right 05/28/2013    Procedure: OPEN REDUCTION INTERNAL FIXATION (ORIF) PERIPROSTHETIC FRACTURE;  Surgeon: Mauri Pole, MD;  Location: [facility];  Service: Orthopedics;  Laterality: Right;      Prior to Admission medications   Medication Sig Start Date End Date Taking? Authorizing Provider  ALPRAZolam Duanne Moron) 0.5 MG tablet Take 1 tablet (0.5 mg total) by mouth 3 (three) times daily as needed for anxiety. 06/07/13  Yes Daniel J Angiulli, PA-C  aspirin EC 81 MG tablet Take 81 mg by mouth daily.   Yes Historical Provider, MD  calcium carbonate (TUMS - DOSED IN MG ELEMENTAL CALCIUM) 500 MG chewable tablet Chew 1 tablet by mouth daily as needed for indigestion or heartburn.   Yes Historical  Provider, MD  Coenzyme Q10 (CO Q 10 PO) Take 1 capsule by mouth daily.    Yes Historical Provider, MD  methocarbamol (ROBAXIN) 500 MG tablet Take 1 tablet (500 mg total) by mouth every 6 (six) hours as needed (muscle spasms). 06/07/13   Lavon Paganini Angiulli, PA-C  Multiple Vitamin (MULTIVITAMIN WITH MINERALS) TABS tablet Take 1 tablet by mouth daily.    Historical Provider, MD  nicotine (NICODERM CQ - DOSED IN MG/24 HOURS) 14 mg/24hr patch 14 mg patch daily x1 week then 7 mg patch daily x3 weeks. 06/07/13   Lavon Paganini Angiulli, PA-C   OxyCODONE (OXYCONTIN) 10 mg T12A 12 hr tablet Take 1 tablet (10 mg total) by mouth every 12 (twelve) hours. 06/07/13   Lavon Paganini Angiulli, PA-C  oxyCODONE-acetaminophen (PERCOCET/ROXICET) 5-325 MG per tablet Take 1-2 tablets by mouth every 4 (four) hours as needed for severe pain. 06/07/13   Lavon Paganini Angiulli, PA-C  ranitidine (ZANTAC) 150 MG tablet Take 1 tablet (150 mg total) by mouth 2 (two) times daily. 04/12/13   Lucille Passy Babish, PA-C  triamcinolone cream (KENALOG) 0.1 % Apply 1 application topically 2 (two) times daily. 01/17/14   Angelica Chessman, MD  vitamin E 400 UNIT capsule Take 400 Units by mouth daily.    Historical Provider, MD     Allergies  Allergen Reactions  . Codeine Other (See Comments)    hallucinating  . Morphine And Related     Made head feel funny    Social History:  reports that she has been smoking Cigarettes.  She has a 12.5 pack-year smoking history. She has never used smokeless tobacco. She reports that she drinks about 1.8 ounces of alcohol per week. She reports that she does not use illicit drugs.     Family History  Problem Relation Age of Onset  . Diabetes Mother   . Kidney disease Mother   . Heart disease Father   . Colon cancer Father     "father may have had colon cancer"  . Cancer Sister     breast       Physical Exam:  GEN:  Pleasant Well Nourished and Well Developed  53 y.o. African American female examined  and in no acute distress; cooperative with exam Filed Vitals:   02/05/14 2345 02/06/14 0000 02/06/14 0032 02/06/14 0107  BP: 156/95 135/95 162/83 163/95  Pulse: 97 100 90 92  Temp:    98.8 F (37.1 C)  TempSrc:    Oral  Resp: 23   22  Height:    5\' 3"  (1.6 m)  Weight:    63.64 kg (140 lb 4.8 oz)  SpO2: 95% 91% 95% 95%   Blood pressure 163/95, pulse 92, temperature 98.8 F (37.1 C), temperature source Oral, resp. rate 22, height 5\' 3"  (1.6 m), weight 63.64 kg (140 lb 4.8 oz), SpO2 95.00%. PSYCH: She is alert and oriented x4;  does not appear anxious does not appear depressed; affect is normal HEENT: Normocephalic and Atraumatic, Mucous membranes pink; PERRLA; EOM intact; Fundi:  Benign;  No scleral icterus, Nares: Patent, Oropharynx: Clear, Neck:  FROM, no cervical lymphadenopathy nor thyromegaly or carotid bruit; no JVD; Breasts:: Not examined CHEST WALL: No tenderness CHEST: Normal respiration, clear to auscultation bilaterally HEART: Regular rate and rhythm; no murmurs rubs or gallops BACK: No kyphosis or scoliosis; no CVA tenderness ABDOMEN: Positive Bowel Sounds, soft non-tender; no masses, no organomegaly. Rectal Exam: Not done EXTREMITIES: No cyanosis, clubbing or edema; no ulcerations. Genitalia: not  examined PULSES: 2+ and symmetric SKIN: Normal hydration no rash or ulceration CNS:  Alert and Oriented x 4, No Focal Deficits  Vascular: pulses palpable throughout    Labs on Admission:  Basic Metabolic Panel:  Recent Labs Lab 02/05/14 2132  NA 151*  K 3.5*  CL 106  CO2 22  GLUCOSE 76  BUN 14  CREATININE 0.55  CALCIUM 9.0   Liver Function Tests:  Recent Labs Lab 02/05/14 2132  AST 269*  ALT 217*  ALKPHOS 102  BILITOT 0.3  PROT 7.2  ALBUMIN 4.2   No results found for this basename: LIPASE, AMYLASE,  in the last 168 hours No results found for this basename: AMMONIA,  in the last 168 hours CBC:  Recent Labs Lab 02/05/14 2132 02/06/14 0230  WBC 6.9  --   HGB 12.8 11.6*  HCT 38.0 35.3*  MCV 105.8*  --   PLT 274  --    Cardiac Enzymes:  Recent Labs Lab 02/05/14 2132 02/06/14 0146  TROPONINI <0.30 <0.30    BNP (last 3 results) No results found for this basename: PROBNP,  in the last 8760 hours CBG: No results found for this basename: GLUCAP,  in the last 168 hours  Radiological Exams on Admission: Dg Chest 2 View  02/05/2014   CLINICAL DATA:  Chest pain  EXAM: CHEST  2 VIEW  COMPARISON:  Prior radiograph from 05/29/2013  FINDINGS: The cardiac and mediastinal  silhouettes are stable in size and contour, and remain within normal limits.  The lungs are normally inflated. No airspace consolidation, pleural effusion, or pulmonary edema is identified. There is no pneumothorax.  No acute osseous abnormality identified. Deformity of the distal left clavicle is stable.  IMPRESSION: No active cardiopulmonary disease.   Electronically Signed   By: Jeannine Boga M.D.   On: 02/05/2014 22:49     EKG: Independently reviewed. Normal sinus Rhythm rate 92, no Acute S-T changes.     Assessment/Plan:   53 y.o. female with  Principal Problem:   Upper GI bleed Active Problems:   Alcohol intoxication   Hypernatremia   Chest pain   Chronic pain syndrome    1.  Upper GI Bleed-   Hematemesis, Placed on an IV Protonix drip, and Monitoring H/Hs,  Transfuse if Needed,  Gi Consult in AM.     2.  Alcohol Intoxication/ and Alcohol Abuse-  CIWA Protocol with IV Ativan,  Counseled re: ETOH cessation.      3.  Hypernatremia- due to dehydration and #2.   IVFs for rehydration, and monitor Na+ levels.     4.  Chest Pain,  Cardiac monitoring, and cycle Troponins, however may be GI related due to Esophagitis, or Gastritis.     5.  Chronic Pain Syndrome - due to chronic Left hip pain , hx THA surgery.   Pain control PRN.    6.  SCDs for DVT prophylaxis.      Code Status:   FULL CODE Family Communication:  Husband at Bedside Disposition Plan:    Inpatient  Time spent:  [facility] Pager [phone]  If 7PM-7AM, please contact night-coverage [email or web address] Password [facility] 02/06/2014, 3:56 AM

## 2014-02-06 NOTE — ED Provider Notes (Signed)
Medical screening examination/treatment/procedure(s) were conducted as a shared visit with non-physician practitioner(s) and myself.  I personally evaluated the patient during the encounter.  Intermittent Chest pain x 56months, worse with palpation.  Hx alcohol abuse, NSAID use.  Developed coffee ground emesis in ED. Vitals stable.  Start PPI, IVF, NGT if vomiting recurs.   EKG Interpretation   Date/Time:  Tuesday February 05 2014 22:47:31 EDT Ventricular Rate:  92 PR Interval:  148 QRS Duration: 86 QT Interval:  388 QTC Calculation: 480 R Axis:   85 Text Interpretation:  Sinus rhythm Artifact Confirmed by Wyvonnia Dusky  MD,  Jessenya Berdan [phone]) on 02/05/2014 11:35:11 PM       Ezequiel Essex, MD 02/06/14 1153

## 2014-02-06 NOTE — H&P (View-Only) (Signed)
Referring Provider: [facility]ist[facility] Care Physician:  No PCP Per Patient Primary Gastroenterologist:  Dr [name].  Reason for Consultation:  Chest pain and hematemesis  HPI: Jaime Phillips is a 53 y.o. female  Known to Dr. [name]  From colonoscopy done 08/2012 from c/o rectal bleeding- this showed diverticulosis, internal hemorrhoids and 2 polyps removed which  were hyperplastic. Pt with hx of hip fracture 10/14  and has had chronic pain since. She has been taking Aleve up to 6 per day,aspirin, advil, and says she is drinking alcohol daily to help with the pain as well- drinking a "cup"' per day . Pt came to the ER yesterday  With c/o chest pain which she had had off and on for 2 weeks and was worsened yesterday left chest-worse with movement and deep breathing-sharp in nature. After had been in ER became nauseated and vomited  x 2 with dark blood.  She says she has vomited small amts of blood at home before over the past months but this was significantly more. She says she has had some trouble with Dysphagia, feeling of food sticking in esophagus recently also, no odynophagia. No prior EGD, One very  dark stool yesterday, none since. No abdominal pain.  HGb 12.8 on admit , down to 11.7 today No lab evidence for cirrhosis, but has transaminitis c/w ETOH hepatitis.   Past Medical History  Diagnosis Date  . Abnormal uterine bleeding   . Depression   . Arthritis   . Rectal bleeding     minor   . [id number](478.2)     Past Surgical History  Procedure Laterality Date  . Appendectomy    . Oophorectomy    . Shoulder surgery      left shoulder  . Colonoscopy    . 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 Orif periprosthetic fracture Right 05/28/2013    Procedure: OPEN REDUCTION INTERNAL FIXATION (ORIF) PERIPROSTHETIC FRACTURE;   Surgeon: Mauri Pole, MD;  Location: [facility];  Service: Orthopedics;  Laterality: Right;    Prior to Admission medications   Medication Sig Start Date End Date Taking? Authorizing Provider  ALPRAZolam Duanne Moron) 0.5 MG tablet Take 1 tablet (0.5 mg total) by mouth 3 (three) times daily as needed for anxiety. 06/07/13  Yes Kollins Fenter J Angiulli, PA-C  aspirin EC 81 MG tablet Take 81 mg by mouth daily.   Yes Historical Provider, MD  calcium carbonate (TUMS - DOSED IN MG ELEMENTAL CALCIUM) 500 MG chewable tablet Chew 1 tablet by mouth daily as needed for indigestion or heartburn.   Yes Historical Provider, MD  Coenzyme Q10 (CO Q 10 PO) Take 1 capsule by mouth daily.    Yes Historical Provider, MD  methocarbamol (ROBAXIN) 500 MG tablet Take 1 tablet (500 mg total) by mouth every 6 (six) hours as needed (muscle spasms). 06/07/13   Lavon Paganini Angiulli, PA-C  Multiple Vitamin (MULTIVITAMIN WITH MINERALS) TABS tablet Take 1 tablet by mouth daily.    Historical Provider, MD  nicotine (NICODERM CQ - DOSED IN MG/24 HOURS) 14 mg/24hr patch 14 mg patch daily x1 week then 7 mg patch daily x3 weeks. 06/07/13   Lavon Paganini Angiulli, PA-C  OxyCODONE (OXYCONTIN) 10 mg T12A 12 hr tablet Take 1 tablet (10 mg total) by mouth every 12 (twelve) hours. 06/07/13   Lavon Paganini Angiulli, PA-C  oxyCODONE-acetaminophen (PERCOCET/ROXICET)  5-325 MG per tablet Take 1-2 tablets by mouth every 4 (four) hours as needed for severe pain. 06/07/13   Lavon Paganini Angiulli, PA-C  ranitidine (ZANTAC) 150 MG tablet Take 1 tablet (150 mg total) by mouth 2 (two) times daily. 04/12/13   Lucille Passy Babish, PA-C  triamcinolone cream (KENALOG) 0.1 % Apply 1 application topically 2 (two) times daily. 01/17/14   Angelica Chessman, MD  vitamin E 400 UNIT capsule Take 400 Units by mouth daily.    Historical Provider, MD    Current Facility-Administered Medications  Medication Dose Route Frequency Provider Last Rate Last Dose  . 0.9 %  sodium chloride infusion    Intravenous Continuous Theressa Millard, MD 75 mL/hr at 02/06/14 0151    . acetaminophen (TYLENOL) tablet 650 mg  650 mg Oral Q6H PRN Theressa Millard, MD       Or  . acetaminophen (TYLENOL) suppository 650 mg  650 mg Rectal Q6H PRN Theressa Millard, MD      . alum & mag hydroxide-simeth (MAALOX/MYLANTA) 200-200-20 MG/5ML suspension 30 mL  30 mL Oral Q6H PRN Theressa Millard, MD      . folic acid (FOLVITE) tablet 1 mg  1 mg Oral Daily Theressa Millard, MD   1 mg at 02/06/14 1002  . HYDROmorphone (DILAUDID) injection 0.5-1 mg  0.5-1 mg Intravenous Q3H PRN Theressa Millard, MD   1 mg at 02/06/14 5366  . LORazepam (ATIVAN) injection 0-4 mg  0-4 mg Intravenous 4 times per day Theressa Millard, MD       Followed by  . [START ON 02/08/2014] LORazepam (ATIVAN) injection 0-4 mg  0-4 mg Intravenous Q12H Harvette C Jenkins, MD      . LORazepam (ATIVAN) tablet 1 mg  1 mg Oral Q6H PRN Theressa Millard, MD       Or  . LORazepam (ATIVAN) injection 1 mg  1 mg Intravenous Q6H PRN Theressa Millard, MD      . multivitamin with minerals tablet 1 tablet  1 tablet Oral Daily Theressa Millard, MD   1 tablet at 02/06/14 1002  . nitroGLYCERIN (NITROSTAT) SL tablet 0.4 mg  0.4 mg Sublingual Q5 min PRN Babette Relic, MD      . ondansetron [facility]) tablet 4 mg  4 mg Oral Q6H PRN Theressa Millard, MD       Or  . ondansetron (ZOFRAN) injection 4 mg  4 mg Intravenous Q6H PRN Theressa Millard, MD      . OxyCODONE (OXYCONTIN) 12 hr tablet 10 mg  10 mg Oral Q12H Theressa Millard, MD   10 mg at 02/06/14 1002  . oxyCODONE-acetaminophen (PERCOCET/ROXICET) 5-325 MG per tablet 1-2 tablet  1-2 tablet Oral Q4H PRN Theressa Millard, MD      . pantoprazole (PROTONIX) 80 mg in sodium chloride 0.9 % 250 mL infusion  8 mg/hr Intravenous Continuous Theressa Millard, MD 25 mL/hr at 02/06/14 0215 8 mg/hr at 02/06/14 0215  . [START ON 02/09/2014] pantoprazole (PROTONIX) injection 40 mg  40 mg Intravenous Q12H  Harvette C Jenkins, MD      . sodium chloride 0.9 % bolus 1,000 mL  1,000 mL Intravenous Once Ezequiel Essex, MD      . thiamine (VITAMIN B-1) tablet 100 mg  100 mg Oral Daily Theressa Millard, MD   100 mg at 02/06/14 1002   Or  . thiamine (B-1) injection 100 mg  100 mg Intravenous Daily  Theressa Millard, MD        Allergies as of 02/05/2014 - Review Complete 02/05/2014  Allergen Reaction Noted  . Codeine Other (See Comments) 07/19/2012  . Morphine and related  08/16/2012    Family History  Problem Relation Age of Onset  . Diabetes Mother   . Kidney disease Mother   . Heart disease Father   . Colon cancer Father     "father may have had colon cancer"  . Cancer Sister     breast    History   Social History  . Marital Status: Single    Spouse Name: N/A    Number of Children: 1  . Years of Education: N/A   Occupational History  . disabled    Social History Main Topics  . Smoking status: Current Every Day Smoker -- 0.50 packs/day for 25 years    Types: Cigarettes  . Smokeless tobacco: Never Used     Comment: Tobacco info given to pt. 08/16/12  . Alcohol Use: 1.8 oz/week    3 Shots of liquor per week  . Drug Use: No  . Sexual Activity: Not Currently    Birth Control/ Protection: Injection   Other Topics Concern  . Not on file   Social History Narrative  . No narrative on file    drinking at least one cup of alcohol daily as of 7/15  Review of Systems: Pertinent positive and negative review of systems were noted in the above HPI section.  All other review of systems was otherwise negative.  Physical Exam: Vital signs in last 24 hours: Temp:  [98.4 F (36.9 C)-98.9 F (37.2 C)] 98.4 F (36.9 C) (07/08 0756) Pulse Rate:  [85-102] 85 (07/08 0756) Resp:  [18-23] 20 (07/08 0756) BP: (104-163)/(66-95) 154/81 mmHg (07/08 0756) SpO2:  [91 %-98 %] 92 % (07/08 0756) Weight:  [140 lb (63.504 kg)-140 lb 4.8 oz (63.64 kg)] 140 lb 4.8 oz (63.64 kg) (07/08  0107) Last BM Date: 02/05/14 General:   Alert,  Well-developed, AA female well-nourished, pleasant and cooperative in NAD Head:  Normocephalic and atraumatic. Eyes:  Sclera clear, no icterus.   Conjunctiva pink. Ears:  Normal auditory acuity. Nose:  No deformity, discharge,  or lesions. Mouth:  No deformity or lesions.   Neck:  Supple; no masses or thyromegaly. Lungs:  Clear throughout to auscultation.   No wheezes, crackles, or rhonchi. She is tender to palpation of left chest wall under left breast over rib margin Heart:  Regular rate and rhythm; no murmurs, clicks, rubs,  or gallops. Abdomen:  Soft,nontender, BS active,nonpalp mass or hsm.   Rectal:  Deferred  Msk:  Symmetrical without gross deformities. . Pulses:  Normal pulses noted. Extremities:  Without clubbing or edema. Neurologic:  Alert and  oriented x4;  grossly normal neurologically. Skin:  Intact without significant lesions or rashes.. Psych:  Alert and cooperative. Normal mood and affect.  Intake/Output from previous day: 07/07 0701 - 07/08 0700 In: 1000 [IV Piggyback:1000] Out: -  Intake/Output this shift:    Lab Results:  Recent Labs  02/05/14 2132 02/06/14 0230 02/06/14 0753  WBC 6.9  --  6.4  HGB 12.8 11.6* 11.2*  HCT 38.0 35.3* 34.2*  PLT 274  --  225   BMET  Recent Labs  02/05/14 2132 02/06/14 0753  NA 151* 143  K 3.5* 3.5*  CL 106 103  CO2 22 19  GLUCOSE 76 64*  BUN 14 9  CREATININE 0.55 0.49*  CALCIUM  9.0 8.1*   LFT  Recent Labs  02/05/14 2132  PROT 7.2  ALBUMIN 4.2  AST 269*  ALT 217*  ALKPHOS 102  BILITOT 0.3    IMPRESSION:  #1  53 yo female with chest pain- suspect muscukuloskeletal #2 hematemesis- R/O erosive esophagitis, Nsaid nduced PUD, MW tear, neoplasm #3 anemia -secondary to above #4 chronic hip pain post fracture 10/14 #5  ETOH abuse #6 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 Essie Christine in detail  with pt #2 serial hgbs BID PPI #3 Hep B and Hep C serology #4 defer workup of chest pain? Rib details  To primary team #5 stop ETOH and NSAID abuse   Amy Esterwood  02/06/2014, 10:34 AM     ________________________________________________________________________  [facility] MD note:  I personally examined the patient, reviewed the data and agree with the assessment and plan described above. Possible NSAID related UGI bleeding.  Planning on EGD today.   Owens Loffler, MD [facility] Pager [phone]

## 2014-02-06 NOTE — Progress Notes (Signed)
Pt arrived to unit alert and oriented x4. Oriented to room, unit, and staff.  Bed in lowest position, bed alarm on and call bell is within reach. Will continue to monitor.

## 2014-02-06 NOTE — Progress Notes (Signed)
Pt seen and examined. Agree with assessment and plan by Ms. Meadow Woods, NP. Briefly, pt presents with hematemesis in the setting of etoh abuse and taking chronic nsaids. Pt is now s/p egd revealing gastritis and esophagitis. Now on BID PPI. GI following. Will cont to follow. Cont on CIWA protocol.

## 2014-02-06 NOTE — Interval H&P Note (Signed)
History and Physical Interval Note:  02/06/2014 1:19 PM  Jaime Phillips  has presented today for surgery, with the diagnosis of hematemesis  The various methods of treatment have been discussed with the patient and family. After consideration of risks, benefits and other options for treatment, the patient has consented to  Procedure(s): ESOPHAGOGASTRODUODENOSCOPY (EGD) (N/A) as a surgical intervention .  The patient's history has been reviewed, patient examined, no change in status, stable for surgery.  I have reviewed the patient's chart and labs.  Questions were answered to the patient's satisfaction.     Milus Banister

## 2014-02-07 ENCOUNTER — Encounter (HOSPITAL_COMMUNITY): Payer: Self-pay | Admitting: Gastroenterology

## 2014-02-07 LAB — CBC
HCT: 39 % (ref 36.0–46.0)
Hemoglobin: 12.9 g/dL (ref 12.0–15.0)
MCH: 35.5 pg — ABNORMAL HIGH (ref 26.0–34.0)
MCHC: 33.1 g/dL (ref 30.0–36.0)
MCV: 107.4 fL — ABNORMAL HIGH (ref 78.0–100.0)
Platelets: 218 10*3/uL (ref 150–400)
RBC: 3.63 MIL/uL — AB (ref 3.87–5.11)
RDW: 13.4 % (ref 11.5–15.5)
WBC: 6.3 10*3/uL (ref 4.0–10.5)

## 2014-02-07 LAB — COMPREHENSIVE METABOLIC PANEL
ALBUMIN: 3.8 g/dL (ref 3.5–5.2)
ALK PHOS: 100 U/L (ref 39–117)
ALT: 152 U/L — ABNORMAL HIGH (ref 0–35)
ANION GAP: 17 — AB (ref 5–15)
AST: 112 U/L — ABNORMAL HIGH (ref 0–37)
BILIRUBIN TOTAL: 0.8 mg/dL (ref 0.3–1.2)
BUN: 7 mg/dL (ref 6–23)
CHLORIDE: 98 meq/L (ref 96–112)
CO2: 23 meq/L (ref 19–32)
Calcium: 9.2 mg/dL (ref 8.4–10.5)
Creatinine, Ser: 0.48 mg/dL — ABNORMAL LOW (ref 0.50–1.10)
GFR calc Af Amer: >90 mL/min (ref >90)
Glucose, Bld: 90 mg/dL (ref 70–99)
POTASSIUM: 4.2 meq/L (ref 3.7–5.3)
Sodium: 138 mEq/L (ref 137–147)
Total Protein: 6.9 g/dL (ref 6.0–8.3)

## 2014-02-07 LAB — HEPATITIS PANEL, ACUTE
HCV Ab: NEGATIVE
HEP B C IGM: NONREACTIVE
Hep A IgM: NONREACTIVE
Hepatitis B Surface Ag: NEGATIVE

## 2014-02-07 LAB — ABO/RH: ABO/RH(D): A POS

## 2014-02-07 MED ORDER — OXYCODONE-ACETAMINOPHEN 5-325 MG PO TABS: 1.0000 | ORAL_TABLET | ORAL | Status: DC | PRN

## 2014-02-07 MED ORDER — AMLODIPINE BESYLATE 5 MG PO TABS
5.0000 mg | ORAL_TABLET | Freq: Every day | ORAL | Status: DC
Start: 2014-02-07 — End: 2014-10-03

## 2014-02-07 MED ORDER — GI COCKTAIL ~~LOC~~
30.0000 mL | Freq: Once | ORAL | Status: AC
  Administered 2014-02-07: 30 mL via ORAL
  Filled 2014-02-07: qty 30

## 2014-02-07 MED ORDER — PANTOPRAZOLE SODIUM 40 MG PO TBEC
40.0000 mg | DELAYED_RELEASE_TABLET | Freq: Two times a day (BID) | ORAL | Status: DC
Start: 2014-02-07 — End: 2014-03-14

## 2014-02-07 NOTE — Progress Notes (Signed)
Pt was discharged today and returned to nsg unit concerning a discharge Rx issue. RN, Ginger, paged this NP. Pt has Protonix and Percocet on one page Rx. She can not afford the protonix. This NP called in Omeprazole 20mg  i po bid #60 nrf in place of Protonix. This NP spoke to the pharmacist and told her the issue stating this NP was replacing the protonix with Omeprazole. Called and LM on pt's friend's phone about Rx call in (pt gave permission). Rx called to [facility] in [place].  Jaime Boll, NP [facility] Pt then returned phone call on friend's phone and this NP informed her of Rx call in and to what pharmacy.

## 2014-02-07 NOTE — Discharge Instructions (Signed)
Take medication as prescribed. Please avoid alcohol and all antiinflammatory drugs (Aleve, ibuprofen). I am starting you on a low dose blood pressure medication. Please call to arrange follow-up with your primary care doctor in 1 week to recheck blood pressure. You will also need follow-up with Dr. Ardis Hughs ([facility]) in 2 to 3 weeks. Return to the ER for any increasing abdominal pain or for vomiting blood.

## 2014-02-07 NOTE — Discharge Summary (Signed)
Physician Discharge Summary  Jaime Phillips YQI:[id number] DOB: March 01, 1961 DOA: 02/05/2014  PCP: No PCP Per Patient  Admit date: 02/05/2014 Discharge date: 02/07/2014  Time spent: 45 minutes  Recommendations for Outpatient Follow-up:  1. Pt instructed to call for follow up appt with [facility] and with PCP   2. Follow-up h. pylori 3. New BP med started at discharge. 1 week follow up with PCP to reassess  Discharge Diagnoses:  Principal Problem:   Upper GI bleed Active Problems:   Alcohol intoxication   Hypernatremia   Chest pain   Chronic pain syndrome   Gastro-esophageal reflux   Discharge Condition: stable  Diet recommendation: regular  Filed Weights   02/05/14 2106 02/06/14 0107  Weight: 63.504 kg (140 lb) 63.64 kg (140 lb 4.8 oz)    History of present illness:       Jaime Phillips is a 53 y.o. female with a history of Alcoholism and Chronic Pain presented to ED on 02/05/14 with complaints of chest pain intermittent x 2 weeks. Shortly after arrival to ED, pt began experiencing hematemesis of dark red blood. She states this has occurred before with small amounts of blood, but she has not followed-up. She did have a colonoscopy done 08/2012 ([facility]) showing diverticulosis and internal hemorrhoids. Reports drinking 3 airplane bottles of gin/day because of chronic left hip pain. She has been taking Aleve (6 tabs daily) for hip pain. EGD done 02/06/14 showed ulcerative, reflux esophagitis and gastritis. H. Pylori pending at discharge   Hospital Course:   Hematemesis secondary to ulcerative esophagitis/gastritis  -appreciate GI assisstance. No recurrent episodes since ED  -s/p EGD 02/06/14  -BID PPI, h. Pylori pending  -no NSAIDs. No etoh  Transaminitis, mild  -mild elevation of AST/ALT  -hep panel negative. Likely related to acute issues and trending down at discharge   Hypertension -mildly elevated throughout hospitalization -Norvasc started at discharge. Follow-up PCP  ETOH  abuse  -no s/sx's withdrawal during hospitalization  Anemia, mild  -secondary to above  -hgb 12.9 this am. No transfusion required during hospital stay  Hypernatremia  -a/w increased urine SG likely d/t dehydration  resolved  Yeast UTI  -Diflucan   Hypokalemia, mild  Resolved s/p po repletion   Right hip pain  -pt states has been workup up in the past with significant arthritis. Plain film shows arthritis -is awaiting medicaid approval to follow-up with ortho  -percocet at d/c. F/u pcp   Procedures:  Upper endoscopy 02/06/14  Consultations:  [facility]   Discharge Exam: Filed Vitals:   02/07/14 0757  BP: 156/90  Pulse: 78  Temp: 98.1 F (36.7 C)  Resp: 15   General: awake, alert in NAD  CV: S1S2 RRR, no m/r/g  Resp: CTAB, no w/r/c, no increased wob  GI: soft, ND, mild tenderness to LUQ without rebound or guarding  Msk: no obvious deformities  Ext: no clubbing, cyanosis, or edema  Psych: AOx3, pleasant affect   Discharge Instructions You were cared for by a hospitalist during your hospital stay. If you have any questions about your discharge medications or the care you received while you were in the hospital after you are discharged, you can call the unit and asked to speak with the hospitalist on call if the hospitalist that took care of you is not available. Once you are discharged, your primary care physician will handle any further medical issues. Please note that NO REFILLS for any discharge medications will be authorized once you are discharged, as it is  imperative that you return to your primary care physician (or establish a relationship with a primary care physician if you do not have one) for your aftercare needs so that they can reassess your need for medications and monitor your lab values.     Medication List    STOP taking these medications       ALEVE PM 220-25 MG Tabs  Generic drug:  Naproxen Sod-Diphenhydramine     amoxicillin 500 MG capsule   Commonly known as:  AMOXIL     aspirin EC 81 MG tablet     naproxen sodium 220 MG tablet  Commonly known as:  ANAPROX     ranitidine 150 MG tablet  Commonly known as:  ZANTAC      TAKE these medications       amLODipine 5 MG tablet  Commonly known as:  NORVASC  Take 1 tablet (5 mg total) by mouth daily.     CO Q 10 PO  Take 1 capsule by mouth daily.     multivitamin with minerals Tabs tablet  Take 1 tablet by mouth daily.     oxyCODONE-acetaminophen 5-325 MG per tablet  Commonly known as:  PERCOCET/ROXICET  Take 1-2 tablets by mouth every 4 (four) hours as needed for severe pain.     pantoprazole 40 MG tablet  Commonly known as:  PROTONIX  Take 1 tablet (40 mg total) by mouth 2 (two) times daily before a meal.     triamcinolone cream 0.1 %  Commonly known as:  KENALOG  Apply 1 application topically 2 (two) times daily.     vitamin E 400 UNIT capsule  Take 400 Units by mouth daily.       Allergies  Allergen Reactions  . Codeine Other (See Comments)    hallucinating  . Morphine And Related     Made head feel funny       Follow-up Information   Follow up with Milus Banister, MD. Schedule an appointment as soon as possible for a visit in 2 weeks.   Specialty:  Gastroenterology   Contact information:   [address] [place] [address] [phone]       Follow up with [facility]    . Schedule an appointment as soon as possible for a visit in 1 week.   Contact information:   [place] [phone] [phone]       The results of significant diagnostics from this hospitalization (including imaging, microbiology, ancillary and laboratory) are listed below for reference.    Significant Diagnostic Studies: Dg Chest 2 View  02/05/2014   CLINICAL DATA:  Chest pain  EXAM: CHEST  2 VIEW  COMPARISON:  Prior radiograph from 05/29/2013  FINDINGS: The cardiac and mediastinal silhouettes are stable in  size and contour, and remain within normal limits.  The lungs are normally inflated. No airspace consolidation, pleural effusion, or pulmonary edema is identified. There is no pneumothorax.  No acute osseous abnormality identified. Deformity of the distal left clavicle is stable.  IMPRESSION: No active cardiopulmonary disease.   Electronically Signed   By: Jeannine Boga M.D.   On: 02/05/2014 22:49   Dg Hip Complete Left  02/06/2014   CLINICAL DATA:  Left hip pain.  EXAM: LEFT HIP - COMPLETE 2+ VIEW  COMPARISON:  None.  FINDINGS: The pelvic image demonstrates a right total hip arthroplasty. There is a plate with cerclage wires in the proximal right femur.  There is severe joint space narrowing with subchondral sclerosis involving the superior left hip joint. Negative for a left hip fracture or dislocation.  IMPRESSION: Left hip osteoarthritis. Severe joint space narrowing along the superior left hip joint.  No acute bone abnormality.   Electronically Signed   By: Markus Daft M.D.   On: 02/06/2014 17:32    Microbiology: No results found for this or any previous visit (from the past 240 hour(s)).   Labs: Basic Metabolic Panel:  Recent Labs Lab 02/05/14 2132 02/06/14 0753 02/07/14 0556  NA 151* 143 138  K 3.5* 3.5* 4.2  CL 106 103 98  CO2 22 19 23   GLUCOSE 76 64* 90  BUN 14 9 7   CREATININE 0.55 0.49* 0.48*  CALCIUM 9.0 8.1* 9.2   Liver Function Tests:  Recent Labs Lab 02/05/14 2132 02/07/14 0556  AST 269* 112*  ALT 217* 152*  ALKPHOS 102 100  BILITOT 0.3 0.8  PROT 7.2 6.9  ALBUMIN 4.2 3.8   No results found for this basename: LIPASE, AMYLASE,  in the last 168 hours No results found for this basename: AMMONIA,  in the last 168 hours CBC:  Recent Labs Lab 02/05/14 2132 02/06/14 0230 02/06/14 0753 02/06/14 1159 02/07/14 0556  WBC 6.9  --  6.4  --  6.3  HGB 12.8 11.6* 11.2* 12.0 12.9  HCT 38.0 35.3* 34.2* 35.9* 39.0  MCV 105.8*  --  106.5*  --  107.4*  PLT 274  --   225  --  218   Cardiac Enzymes:  Recent Labs Lab 02/05/14 2132 02/06/14 0146 02/06/14 0753 02/06/14 1701  TROPONINI <0.30 <0.30 <0.30 <0.30   BNP: BNP (last 3 results) No results found for this basename: PROBNP,  in the last 8760 hours CBG: No results found for this basename: GLUCAP,  in the last 168 hours     Signed:  Patrici Ranks, NP-C [place]  pgr [phone]

## 2014-02-07 NOTE — Progress Notes (Signed)
Jaime Phillips to be D/C'd Home per MD order.  Discussed with the patient and all questions fully answered.    Medication List    STOP taking these medications       ALEVE PM 220-25 MG Tabs  Generic drug:  Naproxen Sod-Diphenhydramine     amoxicillin 500 MG capsule  Commonly known as:  AMOXIL     aspirin EC 81 MG tablet     naproxen sodium 220 MG tablet  Commonly known as:  ANAPROX     ranitidine 150 MG tablet  Commonly known as:  ZANTAC      TAKE these medications       amLODipine 5 MG tablet  Commonly known as:  NORVASC  Take 1 tablet (5 mg total) by mouth daily.     CO Q 10 PO  Take 1 capsule by mouth daily.     multivitamin with minerals Tabs tablet  Take 1 tablet by mouth daily.     oxyCODONE-acetaminophen 5-325 MG per tablet  Commonly known as:  PERCOCET/ROXICET  Take 1-2 tablets by mouth every 4 (four) hours as needed for severe pain.     pantoprazole 40 MG tablet  Commonly known as:  PROTONIX  Take 1 tablet (40 mg total) by mouth 2 (two) times daily before a meal.     triamcinolone cream 0.1 %  Commonly known as:  KENALOG  Apply 1 application topically 2 (two) times daily.     vitamin E 400 UNIT capsule  Take 400 Units by mouth daily.        VVS, Skin clean, dry and intact without evidence of skin break down, no evidence of skin tears noted. IV catheter discontinued intact. Site without signs and symptoms of complications. Dressing and pressure applied.  An After Visit Summary was printed and given to the patient. Patient escorted via [place], and D/C home via private auto.  Elias Else D 02/07/2014 12:46 PM

## 2014-02-07 NOTE — Care Management Note (Signed)
    Page 1 of 1   02/07/2014     12:11:29 PM CARE MANAGEMENT NOTE 02/07/2014  Patient:  [name], Phillips   Account Number:  [id number]  Date Initiated:  02/07/2014  Documentation initiated by:  Tomi Bamberger  Subjective/Objective Assessment:   dx hip pain, gasgtritis, esophagitis  admit- lives with spouse.     Action/Plan:   Anticipated DC Date:  02/07/2014   Anticipated DC Plan:  [place]  CM consult      Choice offered to / List presented to:             Status of service:  Completed, signed off Medicare Important Message given?   (If response is "NO", the following Medicare IM given date fields will be blank) Date Medicare IM given:   Medicare IM given by:   Date Additional Medicare IM given:   Additional Medicare IM given by:    Discharge Disposition:  HOME/SELF CARE  Per UR Regulation:  Reviewed for med. necessity/level of care/duration of stay  If discussed at [place] of Stay Meetings, dates discussed:    Comments:  02/07/14 [place] RN,BSN [phone] NCM checked with financial counselor and they state patient's medicaid is active.  NCM will inform patient. Patient is for dc today, no needs antiicpated.

## 2014-02-14 ENCOUNTER — Ambulatory Visit: Payer: No Typology Code available for payment source | Attending: Internal Medicine | Admitting: Internal Medicine

## 2014-02-14 ENCOUNTER — Encounter: Payer: Self-pay | Admitting: Internal Medicine

## 2014-02-14 VITALS — BP 160/98 | HR 94 | Temp 98.7°F | Resp 16 | Wt 143.0 lb

## 2014-02-14 DIAGNOSIS — K2100 Gastro-esophageal reflux disease with esophagitis, without bleeding: Secondary | ICD-10-CM

## 2014-02-14 DIAGNOSIS — F1011 Alcohol abuse, in remission: Secondary | ICD-10-CM | POA: Insufficient documentation

## 2014-02-14 DIAGNOSIS — R945 Abnormal results of liver function studies: Secondary | ICD-10-CM

## 2014-02-14 DIAGNOSIS — R7989 Other specified abnormal findings of blood chemistry: Secondary | ICD-10-CM | POA: Insufficient documentation

## 2014-02-14 DIAGNOSIS — F17200 Nicotine dependence, unspecified, uncomplicated: Secondary | ICD-10-CM

## 2014-02-14 DIAGNOSIS — K922 Gastrointestinal hemorrhage, unspecified: Secondary | ICD-10-CM

## 2014-02-14 DIAGNOSIS — I10 Essential (primary) hypertension: Secondary | ICD-10-CM | POA: Insufficient documentation

## 2014-02-14 DIAGNOSIS — F1010 Alcohol abuse, uncomplicated: Secondary | ICD-10-CM

## 2014-02-14 NOTE — Progress Notes (Signed)
MRN: [id number] Name: Jaime Phillips  Sex: female Age: 53 y.o. DOB: May 16, 1961  Allergies: Codeine and Morphine and related  Chief Complaint  Patient presents with  . Follow-up    HPI: Patient is 53 y.o. female who Patient was recently hospitalized with a complaint of chest pain and subsequently patient was experiencing hematemesis, patient has history of alcohol abuse also patient was taking NSAIDs for hip pain, she underwent EGD which showed ulcerative reflux esophagitis and gastritis, patient has been started on PPI was advised no alcohol and NSAIDs, also her liver enzymes were elevated and paraspinal was negative and her blood pressure was elevated she was started on Norvasc, during hospitalization ACS ruled out troponins negative most likely chest pain secondary to esophagitis. I have advised patient to quit smoking and cut down on drinking. Patient also has history of left hip arthritis, she is in the process to see the orthopedics.  Past Medical History  Diagnosis Date  . Abnormal uterine bleeding   . Depression   . Arthritis   . Rectal bleeding     minor   . GYIRSWNI(627.0)     Past Surgical History  Procedure Laterality Date  . Appendectomy    . Oophorectomy    . Shoulder surgery      left shoulder  . Colonoscopy    . 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 Orif periprosthetic fracture Right 05/28/2013    Procedure: OPEN REDUCTION INTERNAL FIXATION (ORIF) PERIPROSTHETIC FRACTURE;  Surgeon: Mauri Pole, MD;  Location: [facility];  Service: Orthopedics;  Laterality: Right;  . Esophagogastroduodenoscopy N/A 02/06/2014    Procedure: ESOPHAGOGASTRODUODENOSCOPY (EGD);  Surgeon: Milus Banister, MD;  Location: [facility];  Service: Endoscopy;  Laterality: N/A;      Medication List       This list is accurate as of:  02/14/14  2:34 PM.  Always use your most recent med list.               amLODipine 5 MG tablet  Commonly known as:  NORVASC  Take 1 tablet (5 mg total) by mouth daily.     CO Q 10 PO  Take 1 capsule by mouth daily.     multivitamin with minerals Tabs tablet  Take 1 tablet by mouth daily.     oxyCODONE-acetaminophen 5-325 MG per tablet  Commonly known as:  PERCOCET/ROXICET  Take 1-2 tablets by mouth every 4 (four) hours as needed for severe pain.     pantoprazole 40 MG tablet  Commonly known as:  PROTONIX  Take 1 tablet (40 mg total) by mouth 2 (two) times daily before a meal.     triamcinolone cream 0.1 %  Commonly known as:  KENALOG  Apply 1 application topically 2 (two) times daily.     vitamin E 400 UNIT capsule  Take 400 Units by mouth daily.        No orders of the defined types were placed in this encounter.     There is no immunization history on file for this patient.  Family History  Problem Relation Age of Onset  . Diabetes Mother   . Kidney disease Mother   . Heart disease Father   . Colon cancer Father     "father may have had colon cancer"  . Cancer Sister  breast    History  Substance Use Topics  . Smoking status: Current Every Day Smoker -- 0.50 packs/day for 25 years    Types: Cigarettes  . Smokeless tobacco: Never Used     Comment: Tobacco info given to pt. 08/16/12  . Alcohol Use: 1.8 oz/week    3 Shots of liquor per week    Review of Systems   As noted in HPI  Filed Vitals:   02/14/14 1409  BP: 160/98  Pulse: 94  Temp: 98.7 F (37.1 C)  Resp: 16    Physical Exam  Physical Exam  Constitutional: No distress.  Eyes: EOM are normal. Pupils are equal, round, and reactive to light.  Cardiovascular: Normal rate, regular rhythm and normal heart sounds.   Pulmonary/Chest: Breath sounds normal. She has no wheezes. She has no rales.  Abdominal: Soft. There is no tenderness. There is no rebound.  Musculoskeletal: She exhibits  no edema.    CBC    Component Value Date/Time   WBC 6.3 02/07/2014 0556   RBC 3.63* 02/07/2014 0556   RBC 2.45* 05/28/2013 0500   HGB 12.9 02/07/2014 0556   HCT 39.0 02/07/2014 0556   PLT 218 02/07/2014 0556   MCV 107.4* 02/07/2014 0556   LYMPHSABS 2.1 01/17/2014 1638   MONOABS 0.2 01/17/2014 1638   EOSABS 0.1 01/17/2014 1638   BASOSABS 0.0 01/17/2014 1638    CMP     Component Value Date/Time   NA 138 02/07/2014 0556   K 4.2 02/07/2014 0556   CL 98 02/07/2014 0556   CO2 23 02/07/2014 0556   GLUCOSE 90 02/07/2014 0556   BUN 7 02/07/2014 0556   CREATININE 0.48* 02/07/2014 0556   CREATININE 0.67 01/17/2014 1638   CALCIUM 9.2 02/07/2014 0556   PROT 6.9 02/07/2014 0556   ALBUMIN 3.8 02/07/2014 0556   AST 112* 02/07/2014 0556   ALT 152* 02/07/2014 0556   ALKPHOS 100 02/07/2014 0556   BILITOT 0.8 02/07/2014 0556   GFRNONAA >90 02/07/2014 0556   GFRNONAA >89 01/17/2014 1638   GFRAA >90 02/07/2014 0556   GFRAA >89 01/17/2014 1638    Lab Results  Component Value Date/Time   CHOL 224* 01/17/2014  4:38 PM    No components found with this basename: hga1c    Lab Results  Component Value Date/Time   AST 112* 02/07/2014  5:56 AM    Assessment and Plan  Gastroesophageal reflux disease with esophagitis/Upper GI bleed Counseled patient for lifestyle modification, continue with PPI Status post recent endoscopy, biopsy negative for malignancy and H. pylori negative.  Alcohol abuse Advised patient to cut down drinking.  Abnormal LFTs Likely secondary to drinking alcohol hepatitis panel negative.  Essential hypertension, benign Patient to has not been taking her blood pressure medication, she's advised to take blood pressure medication regularly, she'll come back in 2 weeks for BP check.  Smoking Counseled patient to quit smoking.   Return in about 3 months (around 05/17/2014) for hypertension, BP check in 2 weeks/Nurse Visit.  [name], MD

## 2014-02-14 NOTE — Progress Notes (Signed)
Patient here for follow up from hospital Had a blood clot in her throat Still complains of minor chest pains

## 2014-02-14 NOTE — Patient Instructions (Signed)
DASH Eating Plan  DASH stands for "Dietary Approaches to Stop Hypertension." The DASH eating plan is a healthy eating plan that has been shown to reduce high blood pressure (hypertension). Additional health benefits may include reducing the risk of type 2 diabetes mellitus, heart disease, and stroke. The DASH eating plan may also help with weight loss.  WHAT DO I NEED TO KNOW ABOUT THE DASH EATING PLAN?  For the DASH eating plan, you will follow these general guidelines:  · Choose foods with a percent daily value for sodium of less than 5% (as listed on the food label).  · Use salt-free seasonings or herbs instead of table salt or sea salt.  · Check with your health care provider or pharmacist before using salt substitutes.  · Eat lower-sodium products, often labeled as "lower sodium" or "no salt added."  · Eat fresh foods.  · Eat more vegetables, fruits, and low-fat dairy products.  · Choose whole grains. Look for the word "whole" as the first word in the ingredient list.  · Choose fish and skinless chicken or turkey more often than red meat. Limit fish, poultry, and meat to 6 oz (170 g) each day.  · Limit sweets, desserts, sugars, and sugary drinks.  · Choose heart-healthy fats.  · Limit cheese to 1 oz (28 g) per day.  · Eat more home-cooked food and less restaurant, buffet, and fast food.  · Limit fried foods.  · Cook foods using methods other than frying.  · Limit canned vegetables. If you do use them, rinse them well to decrease the sodium.  · When eating at a restaurant, ask that your food be prepared with less salt, or no salt if possible.  WHAT FOODS CAN I EAT?  Seek help from a dietitian for individual calorie needs.  Grains  Whole grain or whole wheat bread. Brown rice. Whole grain or whole wheat pasta. Quinoa, bulgur, and whole grain cereals. Low-sodium cereals. Corn or whole wheat flour tortillas. Whole grain cornbread. Whole grain crackers. Low-sodium crackers.  Vegetables  Fresh or frozen vegetables  (raw, steamed, roasted, or grilled). Low-sodium or reduced-sodium tomato and vegetable juices. Low-sodium or reduced-sodium tomato sauce and paste. Low-sodium or reduced-sodium canned vegetables.   Fruits  All fresh, canned (in natural juice), or frozen fruits.  Meat and Other Protein Products  Ground beef (85% or leaner), grass-fed beef, or beef trimmed of fat. Skinless chicken or turkey. Ground chicken or turkey. Pork trimmed of fat. All fish and seafood. Eggs. Dried beans, peas, or lentils. Unsalted nuts and seeds. Unsalted canned beans.  Dairy  Low-fat dairy products, such as skim or 1% milk, 2% or reduced-fat cheeses, low-fat ricotta or cottage cheese, or plain low-fat yogurt. Low-sodium or reduced-sodium cheeses.  Fats and Oils  Tub margarines without trans fats. Light or reduced-fat mayonnaise and salad dressings (reduced sodium). Avocado. Safflower, olive, or canola oils. Natural peanut or almond butter.  Other  Unsalted popcorn and pretzels.  The items listed above may not be a complete list of recommended foods or beverages. Contact your dietitian for more options.  WHAT FOODS ARE NOT RECOMMENDED?  Grains  White bread. White pasta. White rice. Refined cornbread. Bagels and croissants. Crackers that contain trans fat.  Vegetables  Creamed or fried vegetables. Vegetables in a cheese sauce. Regular canned vegetables. Regular canned tomato sauce and paste. Regular tomato and vegetable juices.  Fruits  Dried fruits. Canned fruit in light or heavy syrup. Fruit juice.  Meat and Other Protein   Products  Fatty cuts of meat. Ribs, chicken wings, bacon, sausage, bologna, salami, chitterlings, fatback, hot dogs, bratwurst, and packaged luncheon meats. Salted nuts and seeds. Canned beans with salt.  Dairy  Whole or 2% milk, cream, half-and-half, and cream cheese. Whole-fat or sweetened yogurt. Full-fat cheeses or blue cheese. Nondairy creamers and whipped toppings. Processed cheese, cheese spreads, or cheese  curds.  Condiments  Onion and garlic salt, seasoned salt, table salt, and sea salt. Canned and packaged gravies. Worcestershire sauce. Tartar sauce. Barbecue sauce. Teriyaki sauce. Soy sauce, including reduced sodium. Steak sauce. Fish sauce. Oyster sauce. Cocktail sauce. Horseradish. Ketchup and mustard. Meat flavorings and tenderizers. Bouillon cubes. Hot sauce. Tabasco sauce. Marinades. Taco seasonings. Relishes.  Fats and Oils  Butter, stick margarine, lard, shortening, ghee, and bacon fat. Coconut, palm kernel, or palm oils. Regular salad dressings.  Other  Pickles and olives. Salted popcorn and pretzels.  The items listed above may not be a complete list of foods and beverages to avoid. Contact your dietitian for more information.  WHERE CAN I FIND MORE INFORMATION?  National Heart, Lung, and [employer] Institute: www.nhlbi.nih.gov/health/health-topics/topics/dash/  Document Released: 07/08/2011 Document Revised: 07/24/2013 Document Reviewed: 05/23/2013  Exit[employer]® Patient Information ©2015 [employer]. This information is not intended to replace advice given to you by your health care provider. Make sure you discuss any questions you have with your health care provider.

## 2014-02-15 ENCOUNTER — Telehealth: Payer: Self-pay | Admitting: Internal Medicine

## 2014-02-15 NOTE — Telephone Encounter (Signed)
Pt. Dropped Application for parking placard. Please, f/u with Pt.

## 2014-02-20 NOTE — Discharge Summary (Signed)
Pt seen and examined with Ms. [place], NP. Pt presents with hematemesis secondary to esophagitis/gastritis. Appreciated GI input. Pt is to cont with bid PPI and follow up closely as an outpatient with her PCP.

## 2014-02-21 ENCOUNTER — Telehealth: Payer: Self-pay | Admitting: Internal Medicine

## 2014-02-21 NOTE — Telephone Encounter (Signed)
Pt. Needs authorization for parking card. She dropped two weeks ago. Please, f/u with Pt.

## 2014-03-01 ENCOUNTER — Ambulatory Visit: Payer: No Typology Code available for payment source | Attending: Internal Medicine

## 2014-03-01 NOTE — Patient Instructions (Signed)
Continue taking prescribed medication and return on Monday if no improvement in symptoms

## 2014-03-01 NOTE — Progress Notes (Unsigned)
Patient ID: Jaime Phillips, female   DOB: 08/15/1960, 53 y.o.   MRN: [id number] Pt is here for blood pressure recheck s/p starting medication. Pt states she is noncompliant wit taking meds due to dizziness while getting up in the morning and sharp pains in head. Denies blurry vision or chest pain. Informed pt to continue taking medications, if no improvement in sx's return Monday.

## 2014-03-07 ENCOUNTER — Encounter: Payer: Self-pay | Admitting: *Deleted

## 2014-03-10 ENCOUNTER — Emergency Department (HOSPITAL_COMMUNITY)
Admission: EM | Admit: 2014-03-10 | Discharge: 2014-03-10 | Disposition: A | Discharge status: Home / Self Care | Payer: Medicaid Other | Attending: Emergency Medicine | Admitting: Emergency Medicine

## 2014-03-10 ENCOUNTER — Encounter (HOSPITAL_COMMUNITY): Payer: Self-pay | Admitting: Emergency Medicine

## 2014-03-10 ENCOUNTER — Emergency Department (HOSPITAL_COMMUNITY): Payer: Medicaid Other

## 2014-03-10 DIAGNOSIS — F1021 Alcohol dependence, in remission: Secondary | ICD-10-CM | POA: Insufficient documentation

## 2014-03-10 DIAGNOSIS — R4587 Impulsiveness: Secondary | ICD-10-CM | POA: Diagnosis not present

## 2014-03-10 DIAGNOSIS — Z9889 Other specified postprocedural states: Secondary | ICD-10-CM | POA: Insufficient documentation

## 2014-03-10 DIAGNOSIS — G8929 Other chronic pain: Secondary | ICD-10-CM | POA: Insufficient documentation

## 2014-03-10 DIAGNOSIS — Z79899 Other long term (current) drug therapy: Secondary | ICD-10-CM | POA: Diagnosis not present

## 2014-03-10 DIAGNOSIS — M25559 Pain in unspecified hip: Secondary | ICD-10-CM | POA: Insufficient documentation

## 2014-03-10 DIAGNOSIS — Z862 Personal history of diseases of the blood and blood-forming organs and certain disorders involving the immune mechanism: Secondary | ICD-10-CM | POA: Diagnosis not present

## 2014-03-10 DIAGNOSIS — Z860100 Personal history of colon polyps, unspecified: Secondary | ICD-10-CM | POA: Insufficient documentation

## 2014-03-10 DIAGNOSIS — M1610 Unilateral primary osteoarthritis, unspecified hip: Secondary | ICD-10-CM

## 2014-03-10 DIAGNOSIS — F17200 Nicotine dependence, unspecified, uncomplicated: Secondary | ICD-10-CM | POA: Diagnosis not present

## 2014-03-10 DIAGNOSIS — M25552 Pain in left hip: Secondary | ICD-10-CM

## 2014-03-10 DIAGNOSIS — Z8711 Personal history of peptic ulcer disease: Secondary | ICD-10-CM | POA: Diagnosis not present

## 2014-03-10 MED ORDER — OXYCODONE-ACETAMINOPHEN 5-325 MG PO TABS: 1.0000 | ORAL_TABLET | ORAL | Status: DC | PRN

## 2014-03-10 NOTE — ED Notes (Signed)
She is brought in by a female friend with c/o left hip pain.  She has impending procedure for left hip problem.  She appears to be quite intoxicated and smells heavily of ETOH.  She is tearful and in no distress.

## 2014-03-10 NOTE — ED Provider Notes (Signed)
CSN: [id number]     Arrival date & time 03/10/14  1716 History   First MD Initiated Contact with Patient 03/10/14 1735     Chief Complaint  Patient presents with  . Hip Pain     (Consider location/radiation/quality/duration/timing/severity/associated sxs/prior Treatment) The history is provided by the patient.    Patient with hx right hip replacement and with ongoing left hip pain and osteoarthritis.  Reports pain in her left hip is uncontrolled.  Has some tingling in her left leg.  Is drinking alcohol for the pain.  Dr Alvan Dame did her prior right hip replacement.  Denies any new injury to the left hip or leg.    Level V caveat for intoxication.   Past Medical History  Diagnosis Date  . Abnormal uterine bleeding   . Depression   . Arthritis   . Rectal bleeding     minor   . Headache(784.0)   . Macrocytic anemia   . PUD (peptic ulcer disease)   . Diverticulosis   . Internal hemorrhoids   . Mallory-Weiss tear   . Alcoholism   . Chronic pain syndrome   . Hyperplastic colon polyp   . LFT elevation    Past Surgical History  Procedure Laterality Date  . Appendectomy    . Oophorectomy    . Shoulder surgery      left shoulder  . Colonoscopy    . 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 Orif periprosthetic fracture Right 05/28/2013    Procedure: OPEN REDUCTION INTERNAL FIXATION (ORIF) PERIPROSTHETIC FRACTURE;  Surgeon: Mauri Pole, MD;  Location: [facility];  Service: Orthopedics;  Laterality: Right;  . Esophagogastroduodenoscopy N/A 02/06/2014    Procedure: ESOPHAGOGASTRODUODENOSCOPY (EGD);  Surgeon: Milus Banister, MD;  Location: [facility];  Service: Endoscopy;  Laterality: N/A;   Family History  Problem Relation Age of Onset  . Diabetes Mother   . Kidney disease Mother   . Heart disease Father   . Colon cancer Father      questionable  . Breast cancer Sister    History  Substance Use Topics  . Smoking status: Current Every Day Smoker -- 0.50 packs/day for 25 years    Types: Cigarettes  . Smokeless tobacco: Never Used     Comment: Tobacco info given to pt. 08/16/12  . Alcohol Use: 1.8 oz/week    3 Shots of liquor per week   OB History   Grav Para Term Preterm Abortions TAB SAB Ect Mult Living   2 1 1  1  1   1      Review of Systems  Unable to perform ROS: Other      Allergies  Codeine and Morphine and related  Home Medications   Prior to Admission medications   Medication Sig Start Date End Date Taking? Authorizing Provider  amLODipine (NORVASC) 5 MG tablet Take 1 tablet (5 mg total) by mouth daily. 02/07/14  Yes Dionne Milo, NP  Coenzyme Q10 (CO Q 10 PO) Take 5 mg by mouth daily.    Yes Historical Provider, MD  hydroxypropyl methylcellulose (ISOPTO TEARS) 2.5 % ophthalmic solution Place 1 drop into both eyes 3 (three) times daily as needed for dry eyes.   Yes Historical Provider, MD  Multiple Vitamin (MULTIVITAMIN WITH MINERALS) TABS tablet Take 1 tablet by mouth daily.   Yes Historical Provider,  MD  oxyCODONE-acetaminophen (PERCOCET/ROXICET) 5-325 MG per tablet Take 1-2 tablets by mouth every 4 (four) hours as needed for severe pain. 02/07/14  Yes Dionne Milo, NP  pantoprazole (PROTONIX) 40 MG tablet Take 1 tablet (40 mg total) by mouth 2 (two) times daily before a meal. 02/07/14  Yes Dionne Milo, NP  vitamin E 400 UNIT capsule Take 400 Units by mouth daily.   Yes Historical Provider, MD   BP 143/84  Pulse 117  Temp(Src) 98 F (36.7 C) (Oral)  Resp 14  SpO2 96% Physical Exam  Nursing note and vitals reviewed. Constitutional: She appears well-developed and well-nourished. No distress.  HENT:  Head: Normocephalic and atraumatic.  Neck: Neck supple.  Pulmonary/Chest: Effort normal.  Musculoskeletal:  Pt moving around on stretcher, bears weight on left leg getting up  temporarily to reach the trashcan.  Moves both legs equally.  She does have a small area of ecchymosis on her left buttock.   Pt does not allow me to examine her - Dr Darl Householder was able to perform hip exam.    Neurological: She is alert.  Skin: She is not diaphoretic.  Psychiatric: Her affect is labile. Her speech is slurred. Cognition and memory are impaired. She expresses impulsivity.  Intoxicated    ED Course  Procedures (including critical care time) Labs Review Labs Reviewed - No data to display  Imaging Review Dg Hip Complete Left  03/10/2014   CLINICAL DATA:  Left hip pain.  Status post fall.  ETOH.  EXAM: LEFT HIP - COMPLETE 2+ VIEW  COMPARISON:  02/06/2014  FINDINGS: Prior right hip arthroplasty. There is significant degenerative change at the left hip. No evidence for acute fracture or subluxation however.  IMPRESSION: 1. Significant degenerative changes in the left hip. 2.  No evidence for acute  abnormality.   Electronically Signed   By: Shon Hale M.D.   On: 03/10/2014 19:01     EKG Interpretation None      6:39 PM Patient seen with Dr Darl Householder who has also spoken with and examined the patient to the extent she allows.   MDM   Final diagnoses:  Left hip pain  Arthritis pain, hip    Pt with chronic left hip pain presents for uncontrolled pain.  She is intoxicated and admits to drinking ETOH for the pain.  She declines lab work, injected pain medication, pills for pain, xray of the hip.  States she wants a hip replacement.  As she would not allow me to examine her, I discussed the patient with Dr Darl Householder who also spoke with the patient and was able to examine her and convinced her to allow at least the xray.  Xray shows degenerative changes.  Discussed treatment plan with Dr Darl Householder.  D/C home with #10 percocet, orthopedic follow up.  Discussed result, findings, treatment, and follow up  with patient.  Pt given return precautions.  Pt verbalizes understanding and agrees with plan.           Clayton Bibles, PA-C 03/10/14 2119

## 2014-03-10 NOTE — Discharge Instructions (Signed)
Read the information below.  Use the prescribed medication as directed.  Please discuss all new medications with your pharmacist.  Do not take additional tylenol while taking the prescribed pain medication to avoid overdose.  You may return to the Emergency Department at any time for worsening condition or any new symptoms that concern you.  If you develop uncontrolled pain, weakness or numbness of the extremity, severe discoloration of the skin, fevers, or you are unable to walk, return to the ER for a recheck.      Hip Pain Your hip is the joint between your upper legs and your lower pelvis. The bones, cartilage, tendons, and muscles of your hip joint perform a lot of work each day supporting your body weight and allowing you to move around. Hip pain can range from a minor ache to severe pain in one or both of your hips. Pain may be felt on the inside of the hip joint near the groin, or the outside near the buttocks and upper thigh. You may have swelling or stiffness as well.  HOME CARE INSTRUCTIONS   Take medicines only as directed by your health care provider.  Apply ice to the injured area:  Put ice in a plastic bag.  Place a towel between your skin and the bag.  Leave the ice on for 15-20 minutes at a time, 3-4 times a day.  Keep your leg raised (elevated) when possible to lessen swelling.  Avoid activities that cause pain.  Follow specific exercises as directed by your health care provider.  Sleep with a pillow between your legs on your most comfortable side.  Record how often you have hip pain, the location of the pain, and what it feels like. SEEK MEDICAL CARE IF:   You are unable to put weight on your leg.  Your hip is red or swollen or very tender to touch.  Your pain or swelling continues or worsens after 1 week.  You have increasing difficulty walking.  You have a fever. SEEK IMMEDIATE MEDICAL CARE IF:   You have fallen.  You have a sudden increase in pain and  swelling in your hip. MAKE SURE YOU:   Understand these instructions.  Will watch your condition.  Will get help right away if you are not doing well or get worse. Document Released: 01/06/2010 Document Revised: 12/03/2013 Document Reviewed: 03/15/2013 [facility] Patient Information 2015 [place], [place]. This information is not intended to replace advice given to you by your health care provider. Make sure you discuss any questions you have with your health care provider.

## 2014-03-10 NOTE — ED Notes (Addendum)
Patient states she drank 3 flight bottles of gin. At this time patient states we are not drawing her blood. Patient female friend present at bedside.

## 2014-03-11 NOTE — ED Provider Notes (Signed)
Medical screening examination/treatment/procedure(s) were conducted as a shared visit with non-physician practitioner(s) and myself.  I personally evaluated the patient during the encounter.   EKG Interpretation None     Reathel Turi is a 52 y.o. female here with L hip pain. Hx of arthritis and had R hip replacement. Has been on percocet and has been self medicating with alcohol. Appears intoxicated. Moving L hip but has some bruising around L hip area. I discussed with her regarding labs and xrays. She refused labs but is willing to get xray. Xray showed degenerative changes but no fracture. I told her that since she is intoxicated, I can't give her anything in the ED. Can prescribe percocet to go home and give her ortho f/u. Patient went home with husband, who is not intoxicated      Wandra Arthurs, MD 03/11/14 1840

## 2014-03-14 ENCOUNTER — Ambulatory Visit (INDEPENDENT_AMBULATORY_CARE_PROVIDER_SITE_OTHER): Payer: No Typology Code available for payment source | Admitting: Internal Medicine

## 2014-03-14 ENCOUNTER — Encounter: Payer: Self-pay | Admitting: Internal Medicine

## 2014-03-14 VITALS — BP 110/76 | HR 134 | Ht 63.0 in | Wt 144.4 lb

## 2014-03-14 DIAGNOSIS — K2100 Gastro-esophageal reflux disease with esophagitis, without bleeding: Secondary | ICD-10-CM

## 2014-03-14 DIAGNOSIS — Z1211 Encounter for screening for malignant neoplasm of colon: Secondary | ICD-10-CM

## 2014-03-14 MED ORDER — MOVIPREP 100 G PO SOLR: 1.0000 | Freq: Once | ORAL | Status: DC

## 2014-03-14 MED ORDER — OMEPRAZOLE 20 MG PO CPDR: 20.0000 mg | DELAYED_RELEASE_CAPSULE | Freq: Every day | ORAL | Status: DC

## 2014-03-14 NOTE — Progress Notes (Signed)
Subjective:    Patient ID: Jaime Phillips, female    DOB: 12-11-1960, 53 y.o.   MRN: [id number]  HPI  Jaime Phillips is a 53 year old female initially seen by me in January 2014 to evaluate lower, pain and rectal bleeding. She has a history of diverticulosis, anxiety depression, alcohol use, chronic pain, arthritis status post left total hip replacement. She came for colonoscopy on 08/21/2012 this revealed a poor right-sided preparation. Two-day bowel prep was recommended with a followup colonoscopy which she canceled. She was hospitalized in early July 2015 and evaluated by the inpatient GI consult team for hematemesis. She had upper endoscopy performed by Dr. Ardis Hughs on 02/06/2014. 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 She was treated with twice a day PPI and returns for followup. Today she is feeling well though she is having trouble with her left hip which causes chronic pain. She has been taking omeprazole 20 mg twice daily since hospitalization and denies heartburn. She reports this medication has definitely helped her GERD symptoms. No dysphagia or odynophagia. No further bleeding including no melena, hematemesis, or rectal bleeding. Bowel movements have been regular for her lately. She has a history of alcohol use/abuse but reports she has cut back. She is drinking one to 2 drinks of liquor on weekends.  Current Medications, Allergies, Past Medical History, Past Surgical History, Family History and Social History were reviewed in [employer] record.  Review of Systems As per history of present illness, otherwise negative     Objective:   Physical Exam BP 110/76  Pulse 134  Ht 5\' 3"  (1.6 m)  Wt 144 lb 6.4 oz (65.499 kg)  BMI 25.59 kg/m2 Constitutional: Well-developed and well-nourished. No distress. HEENT: Normocephalic and atraumatic. Oropharynx is clear and moist. No oropharyngeal exudate.  Conjunctivae are normal.  No scleral icterus. Neck: Neck supple. Trachea midline. Cardiovascular: Normal rate, regular rhythm and intact distal pulses.  Pulmonary/chest: Effort normal and breath sounds normal. No wheezing, rales or rhonchi. Abdominal: Soft, nontender, nondistended. Bowel sounds active throughout.  Extremities: no clubbing, cyanosis, or edema Neurological: Alert and oriented to person place and time. Psychiatric: Normal mood and affect. Behavior is normal.  CBC    Component Value Date/Time   WBC 6.3 02/07/2014 0556   RBC 3.63* 02/07/2014 0556   RBC 2.45* 05/28/2013 0500   HGB 12.9 02/07/2014 0556   HCT 39.0 02/07/2014 0556   PLT 218 02/07/2014 0556   MCV 107.4* 02/07/2014 0556   MCH 35.5* 02/07/2014 0556   MCHC 33.1 02/07/2014 0556   RDW 13.4 02/07/2014 0556   LYMPHSABS 2.1 01/17/2014 1638   MONOABS 0.2 01/17/2014 1638   EOSABS 0.1 01/17/2014 1638   BASOSABS 0.0 01/17/2014 1638       Assessment & Plan:   53 year old female here for hospital followup after admission for reflux esophagitis associated with hematemesis  1. GERD/reflux esophagitis -- GERD symptoms now completely controlled omeprazole 20 mg twice daily. She has been on twice a day PPI for greater than one month. Will decrease to omeprazole 20 mg daily. Her diet recommended. No dysphagia or other alarm symptoms to warrant repeat EGD.  2. CRC screening -- incomplete colonoscopy due to poor right-sided bowel preparation. I recommended repeat colonoscopy with two-day bowel prep. Procedure discussed including the risks and benefits and she is agreeable to proceed.  3. Left hip pain -- she is planning to see an orthopedic surgeon to address possible left hip  replacement. She prefers a ortho MD in [place], [place] for convenience

## 2014-03-14 NOTE — Patient Instructions (Addendum)
You have been scheduled for a colonoscopy. Please follow written instructions given to you at your visit today.  Please pick up your prep kit at the pharmacy within the next 1-3 days. If you use inhalers (even only as needed), please bring them with you on the day of your procedure. Your physician has requested that you go to www.startemmi.com and enter the access code given to you at your visit today. This web site gives a general overview about your procedure. However, you should still follow specific instructions given to you by our office regarding your preparation for the procedure.  We have sent the following medications to your pharmacy for you to pick up at your convenience: Omeprazole 20 mg daily  CC:Dr Advani

## 2014-03-27 ENCOUNTER — Encounter: Payer: Medicaid Other | Admitting: Internal Medicine

## 2014-04-17 ENCOUNTER — Ambulatory Visit: Payer: Medicaid Other | Admitting: Internal Medicine

## 2014-06-03 ENCOUNTER — Encounter: Payer: Self-pay | Admitting: Internal Medicine

## 2014-06-11 ENCOUNTER — Emergency Department (HOSPITAL_COMMUNITY): Payer: Medicaid Other

## 2014-06-11 ENCOUNTER — Emergency Department (HOSPITAL_COMMUNITY)
Admission: EM | Admit: 2014-06-11 | Discharge: 2014-06-12 | Disposition: A | Discharge status: Home / Self Care | Payer: Medicaid Other | Attending: Emergency Medicine | Admitting: Emergency Medicine

## 2014-06-11 ENCOUNTER — Encounter (HOSPITAL_COMMUNITY): Payer: Self-pay | Admitting: Emergency Medicine

## 2014-06-11 DIAGNOSIS — R112 Nausea with vomiting, unspecified: Secondary | ICD-10-CM | POA: Diagnosis not present

## 2014-06-11 DIAGNOSIS — Z8719 Personal history of other diseases of the digestive system: Secondary | ICD-10-CM | POA: Diagnosis not present

## 2014-06-11 DIAGNOSIS — M25552 Pain in left hip: Secondary | ICD-10-CM

## 2014-06-11 DIAGNOSIS — Z8742 Personal history of other diseases of the female genital tract: Secondary | ICD-10-CM | POA: Insufficient documentation

## 2014-06-11 DIAGNOSIS — Z79899 Other long term (current) drug therapy: Secondary | ICD-10-CM | POA: Insufficient documentation

## 2014-06-11 DIAGNOSIS — Z720 Tobacco use: Secondary | ICD-10-CM | POA: Insufficient documentation

## 2014-06-11 DIAGNOSIS — Z8711 Personal history of peptic ulcer disease: Secondary | ICD-10-CM | POA: Insufficient documentation

## 2014-06-11 DIAGNOSIS — R1084 Generalized abdominal pain: Secondary | ICD-10-CM | POA: Insufficient documentation

## 2014-06-11 DIAGNOSIS — R6883 Chills (without fever): Secondary | ICD-10-CM | POA: Diagnosis not present

## 2014-06-11 DIAGNOSIS — M791 Myalgia: Secondary | ICD-10-CM | POA: Insufficient documentation

## 2014-06-11 DIAGNOSIS — R000 Tachycardia, unspecified: Secondary | ICD-10-CM | POA: Insufficient documentation

## 2014-06-11 DIAGNOSIS — Z8659 Personal history of other mental and behavioral disorders: Secondary | ICD-10-CM | POA: Insufficient documentation

## 2014-06-11 DIAGNOSIS — G894 Chronic pain syndrome: Secondary | ICD-10-CM | POA: Diagnosis not present

## 2014-06-11 DIAGNOSIS — R197 Diarrhea, unspecified: Secondary | ICD-10-CM | POA: Insufficient documentation

## 2014-06-11 DIAGNOSIS — Z862 Personal history of diseases of the blood and blood-forming organs and certain disorders involving the immune mechanism: Secondary | ICD-10-CM | POA: Diagnosis not present

## 2014-06-11 DIAGNOSIS — R52 Pain, unspecified: Secondary | ICD-10-CM | POA: Diagnosis present

## 2014-06-11 DIAGNOSIS — Z86010 Personal history of colonic polyps: Secondary | ICD-10-CM | POA: Diagnosis not present

## 2014-06-11 DIAGNOSIS — R0602 Shortness of breath: Secondary | ICD-10-CM

## 2014-06-11 LAB — CBC WITH DIFFERENTIAL/PLATELET
Basophils Absolute: 0 10*3/uL (ref 0.0–0.1)
Basophils Relative: 0 % (ref 0–1)
EOS PCT: 0 % (ref 0–5)
Eosinophils Absolute: 0 10*3/uL (ref 0.0–0.7)
HEMATOCRIT: 40.5 % (ref 36.0–46.0)
Hemoglobin: 13.5 g/dL (ref 12.0–15.0)
LYMPHS PCT: 25 % (ref 12–46)
Lymphs Abs: 1.1 10*3/uL (ref 0.7–4.0)
MCH: 35.7 pg — ABNORMAL HIGH (ref 26.0–34.0)
MCHC: 33.3 g/dL (ref 30.0–36.0)
MCV: 107.1 fL — AB (ref 78.0–100.0)
Monocytes Absolute: 0.4 10*3/uL (ref 0.1–1.0)
Monocytes Relative: 10 % (ref 3–12)
Neutro Abs: 2.9 10*3/uL (ref 1.7–7.7)
Neutrophils Relative %: 65 % (ref 43–77)
Platelets: 197 10*3/uL (ref 150–400)
RBC: 3.78 MIL/uL — ABNORMAL LOW (ref 3.87–5.11)
RDW: 14.3 % (ref 11.5–15.5)
WBC: 4.3 10*3/uL (ref 4.0–10.5)

## 2014-06-11 LAB — URINE MICROSCOPIC-ADD ON

## 2014-06-11 LAB — COMPREHENSIVE METABOLIC PANEL
ALK PHOS: 143 U/L — AB (ref 39–117)
ALT: 118 U/L — AB (ref 0–35)
AST: 321 U/L — ABNORMAL HIGH (ref 0–37)
Albumin: 4.6 g/dL (ref 3.5–5.2)
Anion gap: 23 — ABNORMAL HIGH (ref 5–15)
BILIRUBIN TOTAL: 1.5 mg/dL — AB (ref 0.3–1.2)
BUN: 6 mg/dL (ref 6–23)
CALCIUM: 9.9 mg/dL (ref 8.4–10.5)
CHLORIDE: 92 meq/L — AB (ref 96–112)
CO2: 22 mEq/L (ref 19–32)
Creatinine, Ser: 0.44 mg/dL — ABNORMAL LOW (ref 0.50–1.10)
GLUCOSE: 77 mg/dL (ref 70–99)
Potassium: 4.8 mEq/L (ref 3.7–5.3)
SODIUM: 137 meq/L (ref 137–147)
Total Protein: 8.4 g/dL — ABNORMAL HIGH (ref 6.0–8.3)

## 2014-06-11 LAB — URINALYSIS, ROUTINE W REFLEX MICROSCOPIC
GLUCOSE, UA: NEGATIVE mg/dL
Hgb urine dipstick: NEGATIVE
Ketones, ur: >80 mg/dL — AB
Leukocytes, UA: NEGATIVE
Nitrite: NEGATIVE
PH: 7 (ref 5.0–8.0)
Protein, ur: 100 mg/dL — AB
Specific Gravity, Urine: 1.025 (ref 1.005–1.030)
Urobilinogen, UA: 1 mg/dL (ref 0.0–1.0)

## 2014-06-11 LAB — I-STAT CG4 LACTIC ACID, ED: LACTIC ACID, VENOUS: 2.13 mmol/L (ref 0.5–2.2)

## 2014-06-11 LAB — LIPASE, BLOOD: Lipase: 36 U/L (ref 11–59)

## 2014-06-11 MED ORDER — OXYCODONE HCL 5 MG PO TABS: 5.0000 mg | ORAL_TABLET | Freq: Four times a day (QID) | ORAL | Status: DC | PRN

## 2014-06-11 MED ORDER — ONDANSETRON HCL 4 MG PO TABS: 4.0000 mg | ORAL_TABLET | Freq: Three times a day (TID) | ORAL | Status: DC | PRN

## 2014-06-11 MED ORDER — FENTANYL CITRATE 0.05 MG/ML IJ SOLN
100.0000 ug | Freq: Once | INTRAMUSCULAR | Status: AC
  Administered 2014-06-11: 100 ug via INTRAVENOUS
  Filled 2014-06-11: qty 2

## 2014-06-11 MED ORDER — ONDANSETRON HCL 4 MG/2ML IJ SOLN
4.0000 mg | Freq: Once | INTRAMUSCULAR | Status: AC
  Administered 2014-06-11: 4 mg via INTRAVENOUS
  Filled 2014-06-11: qty 2

## 2014-06-11 MED ORDER — SODIUM CHLORIDE 0.9 % IV BOLUS (SEPSIS)
1000.0000 mL | Freq: Once | INTRAVENOUS | Status: AC
  Administered 2014-06-11: 1000 mL via INTRAVENOUS

## 2014-06-11 MED ORDER — LORAZEPAM 2 MG/ML IJ SOLN
1.0000 mg | Freq: Once | INTRAMUSCULAR | Status: AC
  Administered 2014-06-11: 1 mg via INTRAVENOUS
  Filled 2014-06-11: qty 1

## 2014-06-11 NOTE — ED Notes (Signed)
Pt O2 sats down to 85% after medications; O2 @ 3lpm via [place] placed

## 2014-06-11 NOTE — ED Provider Notes (Signed)
CSN: [id number]     Arrival date & time 06/11/14  1530 History   First MD Initiated Contact with Patient 06/11/14 1951     Chief Complaint  Patient presents with  . flu like symptoms      (Consider location/radiation/quality/duration/timing/severity/associated sxs/prior Treatment) HPI Comments: Patient is a 53 year old female with history of depression, PUD, alcoholism, chronic pain, hyperplastic colon polyp, LFT elevation presents the emergency department for evaluation of generalized body aches, chills, nausea, vomiting, diarrhea. She reports that she began to feel poorly yesterday. She denies any hematemesis or melena. She denies any fevers, but states she has had significant chills. She currently reports feeling very anxious. She admits to drinking "a couple airplane bottles of liquor" a day. She reports she has not drank since yesterday. She had a total hip replacement of her left hip one month ago. This was done at [facility].  The history is provided by the patient. No language interpreter was used.    Past Medical History  Diagnosis Date  . Abnormal uterine bleeding   . Depression   . Arthritis   . Rectal bleeding     minor   . Headache(784.0)   . Macrocytic anemia   . PUD (peptic ulcer disease)   . Diverticulosis   . Internal hemorrhoids   . Mallory-Weiss tear   . Alcoholism   . Chronic pain syndrome   . Hyperplastic colon polyp   . LFT elevation    Past Surgical History  Procedure Laterality Date  . Appendectomy    . Oophorectomy    . Shoulder surgery      left shoulder  . Colonoscopy    . 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 Orif periprosthetic fracture Right 05/28/2013    Procedure: OPEN REDUCTION INTERNAL FIXATION (ORIF) PERIPROSTHETIC FRACTURE;  Surgeon: Mauri Pole, MD;  Location: [facility];   Service: Orthopedics;  Laterality: Right;  . Esophagogastroduodenoscopy N/A 02/06/2014    Procedure: ESOPHAGOGASTRODUODENOSCOPY (EGD);  Surgeon: Milus Banister, MD;  Location: [facility];  Service: Endoscopy;  Laterality: N/A;   Family History  Problem Relation Age of Onset  . Diabetes Mother   . Kidney disease Mother   . Heart disease Father   . Colon cancer Father     questionable  . Breast cancer Sister    History  Substance Use Topics  . Smoking status: Current Every Day Smoker -- 0.50 packs/day for 25 years    Types: Cigarettes  . Smokeless tobacco: Never Used     Comment: Tobacco info given to pt. 08/16/12  . Alcohol Use: 1.8 oz/week    3 Shots of liquor per week   OB History    Gravida Para Term Preterm AB TAB SAB Ectopic Multiple Living   '2 1 1  1  1   1     ' Review of Systems  Constitutional: Positive for chills. Negative for fever.  Respiratory: Negative for shortness of breath.   Cardiovascular: Negative for chest pain.  Gastrointestinal: Positive for nausea, vomiting, abdominal pain and diarrhea. Negative for blood in stool.  Genitourinary: Negative for dysuria.  Musculoskeletal: Positive for myalgias.  All other systems reviewed and are negative.     Allergies  Codeine and Morphine and related  Home Medications   Prior to Admission medications   Medication Sig Start Date End  Date Taking? Authorizing Provider  amLODipine (NORVASC) 5 MG tablet Take 1 tablet (5 mg total) by mouth daily. 02/07/14   Dionne Milo, NP  Coenzyme Q10 (CO Q 10 PO) Take 5 mg by mouth daily.     Historical Provider, MD  MOVIPREP 100 G SOLR Take 1 kit (200 g total) by mouth once. 03/14/14   Jerene Bears, MD  Multiple Vitamin (MULTIVITAMIN WITH MINERALS) TABS tablet Take 1 tablet by mouth daily.    Historical Provider, MD  omeprazole (PRILOSEC) 20 MG capsule Take 1 capsule (20 mg total) by mouth daily. 03/14/14   Jerene Bears, MD  vitamin E 400 UNIT capsule Take 400 Units by mouth  daily.    Historical Provider, MD   BP 176/121 mmHg  Pulse 108  Temp(Src) 98.6 F (37 C) (Oral)  Resp 20  SpO2 99% Physical Exam  Constitutional: She is oriented to person, place, and time. She appears well-developed and well-nourished. She appears distressed.  Patient appears very uncomfortable  HENT:  Head: Normocephalic and atraumatic.  Right Ear: External ear normal.  Left Ear: External ear normal.  Nose: Nose normal.  Mouth/Throat: Oropharynx is clear and moist.  Eyes: Conjunctivae are normal.  Neck: Normal range of motion.  Cardiovascular: Regular rhythm, normal heart sounds, intact distal pulses and normal pulses.  Tachycardia present.   Pulses:      Radial pulses are 2+ on the right side, and 2+ on the left side.       Posterior tibial pulses are 2+ on the right side, and 2+ on the left side.  Pulmonary/Chest: Effort normal and breath sounds normal. No stridor. No respiratory distress. She has no wheezes. She has no rales.  Abdominal: Soft. She exhibits no distension. There is generalized tenderness. There is no rigidity, no rebound and no guarding.  Musculoskeletal: Normal range of motion.  Neurological: She is alert and oriented to person, place, and time. She has normal strength.  Tremulous   Skin: Skin is warm and dry. She is not diaphoretic. No erythema.  Psychiatric: She has a normal mood and affect. Her behavior is normal.  Nursing note and vitals reviewed.   ED Course  Procedures (including critical care time) Labs Review Labs Reviewed  CBC WITH DIFFERENTIAL - Abnormal; Notable for the following:    RBC 3.78 (*)    MCV 107.1 (*)    MCH 35.7 (*)    All other components within normal limits  COMPREHENSIVE METABOLIC PANEL - Abnormal; Notable for the following:    Chloride 92 (*)    Creatinine, Ser 0.44 (*)    Total Protein 8.4 (*)    AST 321 (*)    ALT 118 (*)    Alkaline Phosphatase 143 (*)    Total Bilirubin 1.5 (*)    Anion gap 23 (*)    All other  components within normal limits  URINALYSIS, ROUTINE W REFLEX MICROSCOPIC - Abnormal; Notable for the following:    Color, Urine AMBER (*)    Bilirubin Urine SMALL (*)    Ketones, ur >80 (*)    Protein, ur 100 (*)    All other components within normal limits  URINE MICROSCOPIC-ADD ON - Abnormal; Notable for the following:    Squamous Epithelial / LPF FEW (*)    Bacteria, UA FEW (*)    All other components within normal limits  LIPASE, BLOOD  I-STAT CG4 LACTIC ACID, ED    Imaging Review Dg Chest 2 View  06/11/2014  CLINICAL DATA:  Nausea, vomiting, diarrhea, weakness, chills, and shortness of breath today  EXAM: CHEST  2 VIEW  COMPARISON:  02/05/2014  FINDINGS: Normal heart size, mediastinal contours, and pulmonary vascularity.  Lungs minimally hyperinflated but clear.  No infiltrate, pleural effusion or pneumothorax.  Old posttraumatic deformity of the distal LEFT clavicle.  No acute osseous abnormalities.  IMPRESSION: No acute abnormalities.   Electronically Signed   By: Lavonia Dana M.D.   On: 06/11/2014 20:31     EKG Interpretation None      MDM   Final diagnoses:  Shortness of breath  Nausea vomiting and diarrhea  Hip pain, left   She presents emergency department for evaluation of nausea, vomiting, diarrhea. She is initially tachycardic, tremulous, hypertensive. Has not drank alcohol since yesterday. She has been out of narcotic pain medication for 2 days. I suspect the patient is acutely withdrawing. Patient feels significantly improved after IV fluids, Ativan, fentanyl. Discussed at length with patient that she needs to stop drinking. Elevated LFTs. Risk of continuing drinking include permanent morbidity and death. Patient with anion gap of 23. She was offered admission, but would prefer to go home. She will be in the care of her husband. Discussed reasons to return to emergency Department immediately. Vital signs stable for discharge. Dr. Ralene Bathe evaluated patient and agrees  with plan. Patient / Family / Caregiver informed of clinical course, understand medical decision-making process, and agree with plan.   Elwyn Lade, PA-C 06/13/14 0230  Quintella Reichert, MD 06/15/14 [phone]

## 2014-06-11 NOTE — ED Notes (Signed)
Pt with tremors and anxiety at present; pt shaking with IV insertion

## 2014-06-11 NOTE — ED Notes (Signed)
Per pt, states flu like symptoms since last night-tremulous, hot flashes, N/V/D

## 2014-06-11 NOTE — ED Notes (Signed)
Awaiting infusion of 2nd IV bolus to finish prior to discharge

## 2014-06-11 NOTE — Discharge Instructions (Signed)
Follow-up with your orthopedic surgeon about additional pain medication for your left hip. Do not combine oxycodone with alcohol. It is very important that you stop drinking alcohol. Your liver enzymes were elevated today. Please follow-up with your primary care physician to have these rechecked. Return to the Emergency Department for new or worsening symptoms.   Abdominal Pain Many things can cause belly (abdominal) pain. Most times, the belly pain is not dangerous. Many cases of belly pain can be watched and treated at home. HOME CARE   Do not take medicines that help you go poop (laxatives) unless told to by your doctor.  Only take medicine as told by your doctor.  Eat or drink as told by your doctor. Your doctor will tell you if you should be on a special diet. GET HELP IF:  You do not know what is causing your belly pain.  You have belly pain while you are sick to your stomach (nauseous) or have runny poop (diarrhea).  You have pain while you pee or poop.  Your belly pain wakes you up at night.  You have belly pain that gets worse or better when you eat.  You have belly pain that gets worse when you eat fatty foods.  You have a fever. GET HELP RIGHT AWAY IF:   The pain does not go away within 2 hours.  You keep throwing up (vomiting).  The pain changes and is only in the right or left part of the belly.  You have bloody or tarry looking poop. MAKE SURE YOU:   Understand these instructions.  Will watch your condition.  Will get help right away if you are not doing well or get worse. Document Released: 01/05/2008 Document Revised: 07/24/2013 Document Reviewed: 03/28/2013 [facility] Patient Information 2015 [place], [place]. This information is not intended to replace advice given to you by your health care provider. Make sure you discuss any questions you have with your health care provider.

## 2014-07-22 ENCOUNTER — Ambulatory Visit: Payer: No Typology Code available for payment source | Admitting: Internal Medicine

## 2014-08-07 ENCOUNTER — Encounter: Payer: Self-pay | Admitting: Internal Medicine

## 2014-10-03 ENCOUNTER — Ambulatory Visit (AMBULATORY_SURGERY_CENTER): Payer: Self-pay

## 2014-10-03 VITALS — Ht 63.0 in | Wt 130.0 lb

## 2014-10-03 DIAGNOSIS — Z860100 Personal history of colon polyps, unspecified: Secondary | ICD-10-CM

## 2014-10-03 NOTE — Progress Notes (Signed)
No allergies to eggs or soy No diet/weight loss meds No home oxygen No past problems with anesthesia  No email 

## 2014-10-17 ENCOUNTER — Other Ambulatory Visit: Payer: Self-pay

## 2014-10-17 ENCOUNTER — Ambulatory Visit (AMBULATORY_SURGERY_CENTER): Payer: Medicaid Other | Admitting: Internal Medicine

## 2014-10-17 ENCOUNTER — Other Ambulatory Visit (INDEPENDENT_AMBULATORY_CARE_PROVIDER_SITE_OTHER): Payer: Medicaid Other

## 2014-10-17 ENCOUNTER — Encounter: Payer: Self-pay | Admitting: Internal Medicine

## 2014-10-17 VITALS — BP 123/77 | HR 90 | Temp 98.6°F | Resp 19 | Ht 63.0 in | Wt 130.0 lb

## 2014-10-17 DIAGNOSIS — R17 Unspecified jaundice: Secondary | ICD-10-CM

## 2014-10-17 DIAGNOSIS — Z86010 Personal history of colonic polyps: Secondary | ICD-10-CM

## 2014-10-17 DIAGNOSIS — Z1211 Encounter for screening for malignant neoplasm of colon: Secondary | ICD-10-CM

## 2014-10-17 LAB — CBC
HCT: 32.8 % — ABNORMAL LOW (ref 36.0–46.0)
HEMOGLOBIN: 11.2 g/dL — AB (ref 12.0–15.0)
MCHC: 34.1 g/dL (ref 30.0–36.0)
MCV: 100.6 fl — ABNORMAL HIGH (ref 78.0–100.0)
Platelets: 194 10*3/uL (ref 150.0–400.0)
RBC: 3.26 Mil/uL — ABNORMAL LOW (ref 3.87–5.11)
RDW: 17.1 % — AB (ref 11.5–15.5)
WBC: 6.2 10*3/uL (ref 4.0–10.5)

## 2014-10-17 LAB — COMPREHENSIVE METABOLIC PANEL
ALT: 80 U/L — ABNORMAL HIGH (ref 0–35)
AST: 234 U/L — ABNORMAL HIGH (ref 0–37)
Albumin: 3.8 g/dL (ref 3.5–5.2)
Alkaline Phosphatase: 364 U/L — ABNORMAL HIGH (ref 39–117)
BUN: 5 mg/dL — ABNORMAL LOW (ref 6–23)
CALCIUM: 8.1 mg/dL — AB (ref 8.4–10.5)
CHLORIDE: 97 meq/L (ref 96–112)
CO2: 24 meq/L (ref 19–32)
CREATININE: 0.71 mg/dL (ref 0.40–1.20)
GFR: 110.18 mL/min (ref >60.00)
Glucose, Bld: 72 mg/dL (ref 70–99)
Potassium: 2.8 mEq/L — CL (ref 3.5–5.1)
Sodium: 137 mEq/L (ref 135–145)
TOTAL PROTEIN: 6.4 g/dL (ref 6.0–8.3)
Total Bilirubin: 7.7 mg/dL — ABNORMAL HIGH (ref 0.2–1.2)

## 2014-10-17 LAB — PROTIME-INR
INR: 1.2 ratio — ABNORMAL HIGH (ref 0.8–1.0)
PROTHROMBIN TIME: 13 s (ref 9.6–13.1)

## 2014-10-17 MED ORDER — SODIUM CHLORIDE 0.9 % IV SOLN: 500.0000 mL | INTRAVENOUS | Status: DC

## 2014-10-17 MED ORDER — POTASSIUM CHLORIDE 20 MEQ PO PACK: PACK | ORAL | Status: DC

## 2014-10-17 NOTE — Patient Instructions (Signed)
YOU HAD AN ENDOSCOPIC PROCEDURE TODAY AT THE [facility]:   Refer to the procedure report that was given to you for any specific questions about what was found during the examination.  If the procedure report does not answer your questions, please call your gastroenterologist to clarify.  If you requested that your care partner not be given the details of your procedure findings, then the procedure report has been included in a sealed envelope for you to review at your convenience later.  YOU SHOULD EXPECT: Some feelings of bloating in the abdomen. Passage of more gas than usual.  Walking can help get rid of the air that was put into your GI tract during the procedure and reduce the bloating. If you had a lower endoscopy (such as a colonoscopy or flexible sigmoidoscopy) you may notice spotting of blood in your stool or on the toilet paper. If you underwent a bowel prep for your procedure, you may not have a normal bowel movement for a few days.  Please Note:  You might notice some irritation and congestion in your nose or some drainage.  This is from the oxygen used during your procedure.  There is no need for concern and it should clear up in a day or so.  SYMPTOMS TO REPORT IMMEDIATELY:   Following lower endoscopy (colonoscopy or flexible sigmoidoscopy):  Excessive amounts of blood in the stool  Significant tenderness or worsening of abdominal pains  Swelling of the abdomen that is new, acute  Fever of 100F or higher   For urgent or emergent issues, a gastroenterologist can be reached at any hour by calling [phone].   DIET: Your first meal following the procedure should be a small meal and then it is ok to progress to your normal diet. Heavy or fried foods are harder to digest and may make you feel nauseous or bloated.  Likewise, meals heavy in dairy and vegetables can increase bloating.  Drink plenty of fluids but you should avoid alcoholic beverages for 24  hours.  ACTIVITY:  You should plan to take it easy for the rest of today and you should NOT DRIVE or use heavy machinery until tomorrow (because of the sedation medicines used during the test).    FOLLOW UP: Our staff will call the number listed on your records the next business day following your procedure to check on you and address any questions or concerns that you may have regarding the information given to you following your procedure. If we do not reach you, we will leave a message.  However, if you are feeling well and you are not experiencing any problems, there is no need to return our call.  We will assume that you have returned to your regular daily activities without incident.  If any biopsies were taken you will be contacted by phone or by letter within the next 1-3 weeks.  Please call us at [phone] if you have not heard about the biopsies in 3 weeks.    SIGNATURES/CONFIDENTIALITY: You and/or your care partner have signed paperwork which will be entered into your electronic medical record.  These signatures attest to the fact that that the information above on your After Visit Summary has been reviewed and is understood.  Full responsibility of the confidentiality of this discharge information lies with you and/or your care-partner. 

## 2014-10-17 NOTE — Progress Notes (Signed)
Patient awakening,vss,report to rn 

## 2014-10-17 NOTE — Op Note (Signed)
[place]  [address] [place], [address]   COLONOSCOPY PROCEDURE REPORT  PATIENT: Jaime, Phillips  MR#: [id number] BIRTHDATE: [birth year] , 32  yrs. old GENDER: female ENDOSCOPIST: Jerene Bears, MD REFERRED QB:[id number] Advani, MD PROCEDURE DATE:  10/17/2014 PROCEDURE:   Colonoscopy, screening First Screening Colonoscopy - Avg.  risk and is 50 yrs.  old or older - No.  Prior Negative Screening - Now for repeat screening. N/A  History of Adenoma - Now for follow-up colonoscopy & has been > or = to 3 yrs.  N/A ASA CLASS:   Class III INDICATIONS:Screening for colonic neoplasia and Colorectal Neoplasm Risk Assessment for this procedure is average risk. MEDICATIONS: Monitored anesthesia care and Propofol 300 mg IV  DESCRIPTION OF PROCEDURE:   After the risks benefits and alternatives of the procedure were thoroughly explained, informed consent was obtained.  The digital rectal exam revealed no rectal mass.   The LB [id number]  endoscope was introduced through the anus and advanced to the cecum, which was identified by both the appendix and ileocecal valve. No adverse events experienced. The quality of the prep was (MoviPrep was used) good.  The instrument was then slowly withdrawn as the colon was fully examined.    COLON FINDINGS: There was moderate diverticulosis noted throughout the entire examined colon.   The examination was otherwise normal. Retroflexed views revealed internal hemorrhoids. The time to cecum = 1.6 Withdrawal time = 6.8   The scope was withdrawn and the procedure completed.  COMPLICATIONS: There were no immediate complications.  ENDOSCOPIC IMPRESSION: 1.   Moderate diverticulosis was noted throughout the entire examined colon 2.   The examination was otherwise normal  RECOMMENDATIONS: 1.  High fiber diet 2.  You should continue to follow colorectal cancer screening guidelines for "routine risk" patients with a repeat  colonoscopy in 10 years.  There is no need for FOBT (stool) testing for at least 5 years.  eSigned:  Jerene Bears, MD 10/17/2014 11:17 AM   cc: the patient, Dr. Annitta Needs

## 2014-10-18 ENCOUNTER — Other Ambulatory Visit: Payer: Self-pay

## 2014-10-18 ENCOUNTER — Telehealth: Payer: Self-pay | Admitting: *Deleted

## 2014-10-18 DIAGNOSIS — R945 Abnormal results of liver function studies: Principal | ICD-10-CM

## 2014-10-18 DIAGNOSIS — R17 Unspecified jaundice: Secondary | ICD-10-CM

## 2014-10-18 DIAGNOSIS — E876 Hypokalemia: Secondary | ICD-10-CM

## 2014-10-18 DIAGNOSIS — R7989 Other specified abnormal findings of blood chemistry: Secondary | ICD-10-CM

## 2014-10-18 NOTE — Telephone Encounter (Signed)
No answer, left message to call if questions or concerns. 

## 2014-10-18 NOTE — Telephone Encounter (Signed)
-----   Message from Jerene Bears, MD sent at 10/17/2014 11:19 AM EDT ----- abd [place], eval liver and r/o ascites

## 2014-10-18 NOTE — Telephone Encounter (Signed)
Patient has been scheduled for abdominal ultrasound 10/25/14 @ 8 am [facility]. Patient has been advised of time, date, location and prep for test. She verbalizes understanding.

## 2014-10-21 ENCOUNTER — Other Ambulatory Visit (INDEPENDENT_AMBULATORY_CARE_PROVIDER_SITE_OTHER): Payer: Medicaid Other

## 2014-10-21 DIAGNOSIS — E876 Hypokalemia: Secondary | ICD-10-CM

## 2014-10-21 DIAGNOSIS — R945 Abnormal results of liver function studies: Principal | ICD-10-CM

## 2014-10-21 DIAGNOSIS — R7989 Other specified abnormal findings of blood chemistry: Secondary | ICD-10-CM

## 2014-10-21 LAB — IBC PANEL
Iron: 201 ug/dL — ABNORMAL HIGH (ref 42–145)
SATURATION RATIOS: 57 % — AB (ref 20.0–50.0)
Transferrin: 252 mg/dL (ref 212.0–360.0)

## 2014-10-21 LAB — FERRITIN: FERRITIN: 268.1 ng/mL (ref 10.0–291.0)

## 2014-10-22 ENCOUNTER — Emergency Department (HOSPITAL_COMMUNITY)
Admission: EM | Admit: 2014-10-22 | Discharge: 2014-10-22 | Disposition: A | Discharge status: Home / Self Care | Payer: Medicaid Other | Attending: Emergency Medicine | Admitting: Emergency Medicine

## 2014-10-22 ENCOUNTER — Encounter (HOSPITAL_COMMUNITY): Payer: Self-pay

## 2014-10-22 ENCOUNTER — Emergency Department (HOSPITAL_COMMUNITY): Payer: Medicaid Other

## 2014-10-22 ENCOUNTER — Telehealth: Payer: Self-pay | Admitting: *Deleted

## 2014-10-22 DIAGNOSIS — Z86010 Personal history of colonic polyps: Secondary | ICD-10-CM | POA: Diagnosis not present

## 2014-10-22 DIAGNOSIS — Z720 Tobacco use: Secondary | ICD-10-CM | POA: Insufficient documentation

## 2014-10-22 DIAGNOSIS — E876 Hypokalemia: Secondary | ICD-10-CM | POA: Insufficient documentation

## 2014-10-22 DIAGNOSIS — R7989 Other specified abnormal findings of blood chemistry: Secondary | ICD-10-CM | POA: Diagnosis present

## 2014-10-22 DIAGNOSIS — Z8719 Personal history of other diseases of the digestive system: Secondary | ICD-10-CM | POA: Insufficient documentation

## 2014-10-22 DIAGNOSIS — R17 Unspecified jaundice: Secondary | ICD-10-CM | POA: Diagnosis not present

## 2014-10-22 DIAGNOSIS — Z791 Long term (current) use of non-steroidal anti-inflammatories (NSAID): Secondary | ICD-10-CM | POA: Insufficient documentation

## 2014-10-22 DIAGNOSIS — Z79899 Other long term (current) drug therapy: Secondary | ICD-10-CM | POA: Diagnosis not present

## 2014-10-22 DIAGNOSIS — G894 Chronic pain syndrome: Secondary | ICD-10-CM | POA: Diagnosis not present

## 2014-10-22 DIAGNOSIS — Z8659 Personal history of other mental and behavioral disorders: Secondary | ICD-10-CM | POA: Insufficient documentation

## 2014-10-22 DIAGNOSIS — Z8742 Personal history of other diseases of the female genital tract: Secondary | ICD-10-CM | POA: Diagnosis not present

## 2014-10-22 DIAGNOSIS — Z862 Personal history of diseases of the blood and blood-forming organs and certain disorders involving the immune mechanism: Secondary | ICD-10-CM | POA: Insufficient documentation

## 2014-10-22 DIAGNOSIS — R1084 Generalized abdominal pain: Secondary | ICD-10-CM | POA: Diagnosis not present

## 2014-10-22 DIAGNOSIS — M1990 Unspecified osteoarthritis, unspecified site: Secondary | ICD-10-CM | POA: Insufficient documentation

## 2014-10-22 DIAGNOSIS — Z8711 Personal history of peptic ulcer disease: Secondary | ICD-10-CM | POA: Diagnosis not present

## 2014-10-22 DIAGNOSIS — R1011 Right upper quadrant pain: Secondary | ICD-10-CM

## 2014-10-22 DIAGNOSIS — Z9079 Acquired absence of other genital organ(s): Secondary | ICD-10-CM | POA: Insufficient documentation

## 2014-10-22 DIAGNOSIS — Z9089 Acquired absence of other organs: Secondary | ICD-10-CM | POA: Insufficient documentation

## 2014-10-22 DIAGNOSIS — R945 Abnormal results of liver function studies: Secondary | ICD-10-CM

## 2014-10-22 LAB — CBC WITH DIFFERENTIAL/PLATELET
Basophils Absolute: 0 K/uL (ref 0.0–0.1)
Basophils Relative: 0 % (ref 0–1)
Eosinophils Absolute: 0 K/uL (ref 0.0–0.7)
Eosinophils Relative: 0 % (ref 0–5)
HCT: 31.6 % — ABNORMAL LOW (ref 36.0–46.0)
Hemoglobin: 10.3 g/dL — ABNORMAL LOW (ref 12.0–15.0)
Lymphocytes Relative: 29 % (ref 12–46)
Lymphs Abs: 1.4 K/uL (ref 0.7–4.0)
MCH: 34.6 pg — ABNORMAL HIGH (ref 26.0–34.0)
MCHC: 32.6 g/dL (ref 30.0–36.0)
MCV: 106 fL — ABNORMAL HIGH (ref 78.0–100.0)
Monocytes Absolute: 0.4 K/uL (ref 0.1–1.0)
Monocytes Relative: 8 % (ref 3–12)
Neutro Abs: 3.1 K/uL (ref 1.7–7.7)
Neutrophils Relative %: 63 % (ref 43–77)
Platelets: 256 K/uL (ref 150–400)
RBC: 2.98 MIL/uL — ABNORMAL LOW (ref 3.87–5.11)
RDW: 15.4 % (ref 11.5–15.5)
WBC: 4.8 K/uL (ref 4.0–10.5)

## 2014-10-22 LAB — ANA: Anti Nuclear Antibody(ANA): NEGATIVE

## 2014-10-22 LAB — COMPREHENSIVE METABOLIC PANEL
ALT: 114 U/L — ABNORMAL HIGH (ref 0–35)
AST: 335 U/L — ABNORMAL HIGH (ref 0–37)
Albumin: 3.4 g/dL — ABNORMAL LOW (ref 3.5–5.2)
Alkaline Phosphatase: 347 U/L — ABNORMAL HIGH (ref 39–117)
Anion gap: 16 — ABNORMAL HIGH (ref 5–15)
BUN: 6 mg/dL (ref 6–23)
CO2: 25 mmol/L (ref 19–32)
Calcium: 8 mg/dL — ABNORMAL LOW (ref 8.4–10.5)
Chloride: 99 mmol/L (ref 96–112)
Creatinine, Ser: 0.51 mg/dL (ref 0.50–1.10)
GFR calc Af Amer: >90 mL/min (ref >90)
GFR calc non Af Amer: >90 mL/min (ref >90)
Glucose, Bld: 93 mg/dL (ref 70–99)
Potassium: 2.9 mmol/L — ABNORMAL LOW (ref 3.5–5.1)
Sodium: 140 mmol/L (ref 135–145)
Total Bilirubin: 4.8 mg/dL — ABNORMAL HIGH (ref 0.3–1.2)
Total Protein: 6.5 g/dL (ref 6.0–8.3)

## 2014-10-22 LAB — LIPASE, BLOOD: Lipase: 30 U/L (ref 11–59)

## 2014-10-22 LAB — BASIC METABOLIC PANEL
BUN: 5 mg/dL — AB (ref 6–23)
CO2: 25 meq/L (ref 19–32)
Calcium: 8.2 mg/dL — ABNORMAL LOW (ref 8.4–10.5)
Chloride: 97 mEq/L (ref 96–112)
Creatinine, Ser: 0.54 mg/dL (ref 0.40–1.20)
GFR: 151.09 mL/min (ref >60.00)
Glucose, Bld: 99 mg/dL (ref 70–99)
POTASSIUM: 2.8 meq/L — AB (ref 3.5–5.1)
Sodium: 138 mEq/L (ref 135–145)

## 2014-10-22 LAB — URINE MICROSCOPIC-ADD ON

## 2014-10-22 LAB — URINALYSIS, ROUTINE W REFLEX MICROSCOPIC
Glucose, UA: NEGATIVE mg/dL
Hgb urine dipstick: NEGATIVE
Ketones, ur: 15 mg/dL — AB
Nitrite: NEGATIVE
Protein, ur: NEGATIVE mg/dL
Specific Gravity, Urine: 1.024 (ref 1.005–1.030)
Urobilinogen, UA: 4 mg/dL — ABNORMAL HIGH (ref 0.0–1.0)
pH: 6 (ref 5.0–8.0)

## 2014-10-22 LAB — ANTI-SMOOTH MUSCLE ANTIBODY, IGG: Smooth Muscle Ab: 14 U (ref <20)

## 2014-10-22 MED ORDER — SODIUM CHLORIDE 0.9 % IV SOLN
Freq: Once | INTRAVENOUS | Status: AC
  Administered 2014-10-22: 19:00:00 via INTRAVENOUS

## 2014-10-22 MED ORDER — POTASSIUM CHLORIDE 10 MEQ/100ML IV SOLN
10.0000 meq | Freq: Once | INTRAVENOUS | Status: AC
  Administered 2014-10-22: 10 meq via INTRAVENOUS
  Filled 2014-10-22: qty 100

## 2014-10-22 MED ORDER — POTASSIUM CHLORIDE 20 MEQ/15ML (10%) PO SOLN
40.0000 meq | Freq: Once | ORAL | Status: AC
  Administered 2014-10-22: 40 meq via ORAL
  Filled 2014-10-22: qty 30

## 2014-10-22 MED ORDER — POTASSIUM CHLORIDE CRYS ER 20 MEQ PO TBCR: 40.0000 meq | EXTENDED_RELEASE_TABLET | Freq: Once | ORAL | Status: DC

## 2014-10-22 MED ORDER — POTASSIUM CHLORIDE ER 10 MEQ PO TBCR: 10.0000 meq | EXTENDED_RELEASE_TABLET | Freq: Once | ORAL | Status: DC

## 2014-10-22 NOTE — Telephone Encounter (Signed)
Per Dr Vena Rua request, I have spoken to patient about her continued hypokalemia (2.8). She states that she just picked up her oral potassium today (this was supposed to be started 5 days ago whenever we talked to her about her hypokalemia previously). I have advised that at this point, her potassium has been very low for several days and she needs to go to the hospital as soon as possible for IV potassium and possible electrolyte replacement. Explained the danger of continued low potassium to her. She verbalizes understanding and states that she will go to the hospital ([facility]).

## 2014-10-22 NOTE — ED Provider Notes (Signed)
CSN: [id number]     Arrival date & time 10/22/14  1529 History   First MD Initiated Contact with Patient 10/22/14 1631     Chief Complaint  Patient presents with  . Abnormal Lab    HPI   54 year old female patient presents today with abnormal potassium levels. Patient reports that for the past month she's had right upper quadrant pain and tenderness, worse with eating foods.. She describes the pain as dull, with no radiation. She notes that for the past 2 weeks she has experienced decreased appetite and nausea, vomiting. She reports she was seen by her GI doctor is ago for routine colonoscopy with labs at that time. She was notified of her low potassium and instructed to follow-up in the emergency room for IV potassium. Patient notes that she scheduled for an abdominal ultrasound on the 25th 2016 for right upper quadrant pain. Patient also notes jaundice in her eyes. She denies fever, shortness of breath, headaches, dizziness, lower abdominal pain, changes in abdominal girth, changes in bowel habits. No swelling of the distal extremities. She notes change in color of the urine "orange" color, she denies pain with urination or increased frequency. Denies vaginal discharge.  Billie Lade M.D. is patient's gastroenterologist who recommended she be seen here for IV potassium and normalization of electrolytes. It is his recommendation that she received abdominal ultrasound given her recent elevated liver enzymes and cholestatic jaundice as he is concerned is alcoholic hepatitis given AST to ALT ratio. He reports GI consult service available of needed.    Past Medical History  Diagnosis Date  . Abnormal uterine bleeding   . Depression   . Arthritis   . Rectal bleeding     minor   . Headache(784.0)   . Macrocytic anemia   . PUD (peptic ulcer disease)   . Diverticulosis   . Internal hemorrhoids   . Mallory-Weiss tear   . Alcoholism   . Chronic pain syndrome   . Hyperplastic colon polyp   . LFT  elevation    Past Surgical History  Procedure Laterality Date  . Appendectomy    . Oophorectomy    . Shoulder surgery      left shoulder  . Colonoscopy    . 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 Orif periprosthetic fracture Right 05/28/2013    Procedure: OPEN REDUCTION INTERNAL FIXATION (ORIF) PERIPROSTHETIC FRACTURE;  Surgeon: Mauri Pole, MD;  Location: [facility];  Service: Orthopedics;  Laterality: Right;  . Esophagogastroduodenoscopy N/A 02/06/2014    Procedure: ESOPHAGOGASTRODUODENOSCOPY (EGD);  Surgeon: Milus Banister, MD;  Location: [facility];  Service: Endoscopy;  Laterality: N/A;  . Total hip arthroplasty      left hip   Family History  Problem Relation Age of Onset  . Diabetes Mother   . Kidney disease Mother   . Heart disease Father   . Colon cancer Father     questionable  . Breast cancer Sister    History  Substance Use Topics  . Smoking status: Current Every Day Smoker -- 0.50 packs/day for 25 years    Types: Cigarettes  . Smokeless tobacco: Never Used     Comment: Tobacco info given to pt. 08/16/12  . Alcohol Use: 1.8 oz/week    3 Shots of liquor per week   OB History    Gravida Para Term  Preterm AB TAB SAB Ectopic Multiple Living   _0 Review of Systems  All other systems reviewed and are negative.  Allergies  Codeine and Morphine and related  Home Medications   Prior to Admission medications   Medication Sig Start Date End Date Taking? Authorizing Provider  acetaminophen (TYLENOL) 500 MG tablet Take 500 mg by mouth daily.    Historical Provider, MD  Coenzyme Q10 (CO Q 10 PO) Take 5 mg by mouth daily.     Historical Provider, MD  diphenhydramine-acetaminophen (TYLENOL PM) 25-500 MG TABS Take 1 tablet by mouth at bedtime as needed.    Historical Provider, MD  MOVIPREP 100 G  SOLR Take 1 kit (200 g total) by mouth once. 03/14/14   Jerene Bears, MD  Multiple Vitamin (MULTI-VITAMINS) TABS Take by mouth.    Historical Provider, MD  naproxen sodium (ANAPROX) 220 MG tablet Take 220 mg by mouth 2 (two) times daily with a meal.    Historical Provider, MD  oxyCODONE (ROXICODONE) 5 MG immediate release tablet Take 1 tablet (5 mg total) by mouth every 6 (six) hours as needed for severe pain. Patient not taking: Reported on 10/17/2014 06/11/14   Cleatrice Burke, PA-C  potassium chloride (KLOR-CON) 20 MEQ packet Take 2 tabs now Take 2 tabs at bedtime Take 2 tabs in am 10/17/14   Jerene Bears, MD  ranitidine (ZANTAC) 150 MG capsule Take 150 mg by mouth daily as needed for heartburn.    Historical Provider, MD  vitamin E 400 UNIT capsule Take 400 Units by mouth daily.    Historical Provider, MD   BP 137/86 mmHg  Pulse 86  Temp(Src) 98.1 F (36.7 C) (Oral)  Resp 18  SpO2 98% Physical Exam  Constitutional: She is oriented to person, place, and time. She appears well-developed and well-nourished.  HENT:  Head: Normocephalic and atraumatic.  Eyes: Pupils are equal, round, and reactive to light.  Jaundice  Neck: Normal range of motion. Neck supple. No JVD present. No tracheal deviation present. No thyromegaly present.  Cardiovascular: Normal rate, regular rhythm, normal heart sounds and intact distal pulses.  Exam reveals no gallop and no friction rub.   No murmur heard. Pulmonary/Chest: Effort normal and breath sounds normal. No stridor. No respiratory distress. She has no wheezes. She has no rales. She exhibits no tenderness.  Abdominal: She exhibits distension. She exhibits no mass. There is tenderness. There is no rigidity, no rebound, no guarding, no CVA tenderness, no tenderness at McBurney's point and negative Murphy's sign.  Mildly distended, mild diffuse tenderness,   Musculoskeletal: Normal range of motion.  Lymphadenopathy:    She has no cervical adenopathy.   Neurological: She is alert and oriented to person, place, and time. Coordination normal.  Skin: Skin is warm and dry.  Psychiatric: She has a normal mood and affect. Her behavior is normal. Judgment and thought content normal.  Nursing note and vitals reviewed.   ED Course  Procedures (including critical care time) Labs Review Labs Reviewed  CBC WITH DIFFERENTIAL/PLATELET    Imaging Review No results found.   EKG Interpretation None      MDM   Final diagnoses:  Hypokalemia  Elevated liver function tests  Right upper quadrant pain   Labs: for hypokalemia, transaminitis and hyperbilirubinemia over 7  Imaging: US abdomen  IMPRESSION: 1. Echogenic and somewhat heterogeneous liver is nonspecific the and could reflect hepatic steatosis or hepatic  inflammation in setting of hepatitis 2. No biliary dilatation although there is some borderline gallbladder wall thickening which might relate to the patient's mild hypoalbuminemia but could also indicate low grade inflammation. Sonographic Murphy's sign absent. Mild sludge in the gallbladder without gallstones seen.  Consults: [facility] Dr. Henrene Pastor  Therapeutics:10 Meq potassium IV, 40 MeQ oral  Assessment/Plan: Significant for hypokalemia, transaminitis and hyperbilirubinemia over 7. Pt was stable and non-toxic throughout hospital stay. Her care was discussed with Dr. Henrene Pastor at [facility]. He recommended Pt contact office first thing tomorrow morning for immediate follow-up. Pt was discharged home with oral potassium supplements and explained the importance of her taking them. She was informed of potentially life threatening complications of not adhering to plan. She was encouraged to refrain from alcohol use as well. Pt assured me that she would betaking her potassium and follow-up first thing tomorrow morning.     Okey Regal, PA-C 10/23/14 1634  Noemi Chapel, MD 10/23/14 2106

## 2014-10-22 NOTE — Discharge Instructions (Signed)
Patient is advised to take potassium supplements once a day for the next 10 days. Please follow-up with Dr. [name] tomorrow morning to schedule follow-up appointment. If new worsening signs or symptoms present please return immediately follow up evaluation.

## 2014-10-22 NOTE — ED Notes (Signed)
Ultrasound Bedside.

## 2014-10-22 NOTE — ED Notes (Signed)
Per pt, seen by MD yesterday.  Basic lab drawn.  Told to come here today for low potassium.  2.8 K

## 2014-10-22 NOTE — ED Provider Notes (Signed)
The patient is a 54 year old female, she does report having an increased alcohol use over the last year since having surgery, she reports with abdominal distention, conjunctival jaundice and the report of a low potassium from the GI doctor. She was told to come to the hospital for ultrasound according to the patient's report. On exam the patient has a diffusely mildly tender abdomen, more in the epigastrium, there is a positive fluid wave, no tympanitic sounds to percussion, no Murphy sign. She does appear jaundiced, her heart and lungs were normal, her labs are significantly abnormal for hypokalemia, transaminitis and hyperbilirubinemia over 7. Ultrasound pending, may need CT scan to evaluate for other sorts of biliary obstruction, could also be related to alcohol use and cirrhosis.  Medical screening examination/treatment/procedure(s) were conducted as a shared visit with non-physician practitioner(s) and myself.  I personally evaluated the patient during the encounter.  Clinical Impression:   Final diagnoses:  Hypokalemia  Elevated liver function tests  Right upper quadrant pain         Noemi Chapel, MD 10/23/14 1009

## 2014-10-22 NOTE — Telephone Encounter (Signed)
Agree with ER evaluation for persistent hypokalemia I expect she will need admission for IV potassium and normalization of electrolytes While admitted abdominal ultrasound is recommended given recent elevated liver enzymes and cholestatic jaundice Concern is alcoholic hepatitis given AST to ALT ratio GI consult service available if needed

## 2014-10-23 ENCOUNTER — Telehealth: Payer: Self-pay | Admitting: Internal Medicine

## 2014-10-23 DIAGNOSIS — R945 Abnormal results of liver function studies: Principal | ICD-10-CM

## 2014-10-23 DIAGNOSIS — R7989 Other specified abnormal findings of blood chemistry: Secondary | ICD-10-CM

## 2014-10-23 DIAGNOSIS — E876 Hypokalemia: Secondary | ICD-10-CM

## 2014-10-23 LAB — IGG: IgG (Immunoglobin G), Serum: 933 mg/dL (ref 690–1700)

## 2014-10-23 LAB — CERULOPLASMIN: CERULOPLASMIN: 33 mg/dL (ref 18–53)

## 2014-10-23 LAB — MITOCHONDRIAL ANTIBODIES: MITOCHONDRIAL M2 AB, IGG: 0.06 (ref <0.91)

## 2014-10-23 LAB — ALPHA-1-ANTITRYPSIN: A1 ANTITRYPSIN SER: 174 mg/dL (ref 83–199)

## 2014-10-23 NOTE — Telephone Encounter (Signed)
Dr [name]- I contacted patient. Advised I would have physician look over information from yesterday.Marland KitchenMarland KitchenPlease look over ER records from yesterday and advise.Marland KitchenMarland KitchenMarland Kitchen

## 2014-10-23 NOTE — Telephone Encounter (Signed)
I have spoken to the patient about Dr Vena Rua recommendations. She is scheduled for see Alonza Bogus, PA-C on 11/08/14 @ 3:00 pm. I have also asked that she come for [facility] tomorrow as requested by Dr [name]. She states that she will do this. She also verbalizes understanding of Dr Vena Rua recommendations.

## 2014-10-23 NOTE — Telephone Encounter (Signed)
Hopefully they supplemented her electrolytes with IV potassium while in the ER I'm concerned about alcohol related liver inflammation or hepatitis Please repeat acute hepatitis panel and hepatic function panel, with INR in BMP tomorrow May need additional oral potassium supplementation depending on tomorrow's labs Avoid all alcohol and follow-up in the office in 2 weeks to see APP

## 2014-10-24 ENCOUNTER — Other Ambulatory Visit (INDEPENDENT_AMBULATORY_CARE_PROVIDER_SITE_OTHER): Payer: Medicaid Other

## 2014-10-24 DIAGNOSIS — E876 Hypokalemia: Secondary | ICD-10-CM

## 2014-10-24 DIAGNOSIS — R7989 Other specified abnormal findings of blood chemistry: Secondary | ICD-10-CM | POA: Diagnosis not present

## 2014-10-24 DIAGNOSIS — R945 Abnormal results of liver function studies: Principal | ICD-10-CM

## 2014-10-24 LAB — HEPATIC FUNCTION PANEL
ALT: 78 U/L — ABNORMAL HIGH (ref 0–35)
AST: 170 U/L — ABNORMAL HIGH (ref 0–37)
Albumin: 3.6 g/dL (ref 3.5–5.2)
Alkaline Phosphatase: 327 U/L — ABNORMAL HIGH (ref 39–117)
Bilirubin, Direct: 3.5 mg/dL — ABNORMAL HIGH (ref 0.0–0.3)
Total Bilirubin: 5.7 mg/dL — ABNORMAL HIGH (ref 0.2–1.2)
Total Protein: 6.6 g/dL (ref 6.0–8.3)

## 2014-10-24 LAB — PROTIME-INR
INR: 1.2 ratio — ABNORMAL HIGH (ref 0.8–1.0)
Prothrombin Time: 12.9 s (ref 9.6–13.1)

## 2014-10-24 LAB — BASIC METABOLIC PANEL
BUN: 4 mg/dL — ABNORMAL LOW (ref 6–23)
CALCIUM: 8.4 mg/dL (ref 8.4–10.5)
CO2: 30 mEq/L (ref 19–32)
CREATININE: 0.5 mg/dL (ref 0.40–1.20)
Chloride: 99 mEq/L (ref 96–112)
GFR: 165.12 mL/min (ref >60.00)
GLUCOSE: 116 mg/dL — AB (ref 70–99)
Potassium: 4.1 mEq/L (ref 3.5–5.1)
SODIUM: 138 meq/L (ref 135–145)

## 2014-10-25 ENCOUNTER — Ambulatory Visit (HOSPITAL_COMMUNITY): Payer: Medicaid Other

## 2014-10-25 LAB — HEPATITIS A ANTIBODY, TOTAL: Hep A Total Ab: NONREACTIVE

## 2014-10-25 LAB — HEPATITIS B CORE ANTIBODY, TOTAL: HEP B C TOTAL AB: NONREACTIVE

## 2014-10-25 LAB — HEPATITIS B SURFACE ANTIGEN: Hepatitis B Surface Ag: NEGATIVE

## 2014-10-25 LAB — HEPATITIS C ANTIBODY: HCV AB: NEGATIVE

## 2014-10-25 LAB — HEPATITIS B SURFACE ANTIBODY,QUALITATIVE: HEP B S AB: NEGATIVE

## 2014-10-28 ENCOUNTER — Other Ambulatory Visit: Payer: Self-pay

## 2014-10-28 DIAGNOSIS — E876 Hypokalemia: Secondary | ICD-10-CM

## 2014-10-29 ENCOUNTER — Telehealth: Payer: Self-pay | Admitting: Internal Medicine

## 2014-10-29 NOTE — Telephone Encounter (Signed)
I have spoken to Alonza Bogus, PA-C (in Dr Vena Rua absence). After reviewing labs from 10/24/14, Dr [name] indicated that her potassium was good. Janett Billow states that since patient's potassium level is normal after IV K+, she does not need oral supplementation at this time. She is to keep follow up on 11/08/14 and have labwork completed in 2 weeks (from 10/24/14) and at that time, we can see if there is a downward trend on potassium or if she can continue to do without supplementation. She verbalizes understanding.

## 2014-11-06 IMAGING — CT CT ABD-PELV W/ CM
1 of 3 series · 14 of 32 positions shown, 19 images · IV contrast (OMNIPAQUE 300)
Comparison: None.

CLINICAL DATA: Rectal bleeding

CT ABDOMEN AND PELVIS WITH CONTRAST
TECHNIQUE: Multidetector CT imaging of the abdomen and pelvis was
performed following the standard protocol during bolus
administration of intravenous contrast.
Contrast: 100mL OMNIPAQUE IOHEXOL 300 MG/ML  SOLN

[Series 2: abd/pel with · axial · 0.74mm/px · z∈[-273,+62]mm · 14 of 75 slices shown, 19 images]
[im 4/75  soft-tissue]
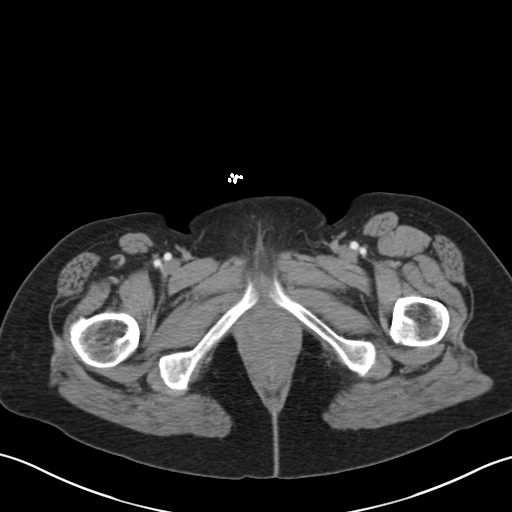
[im 4/75  bone]
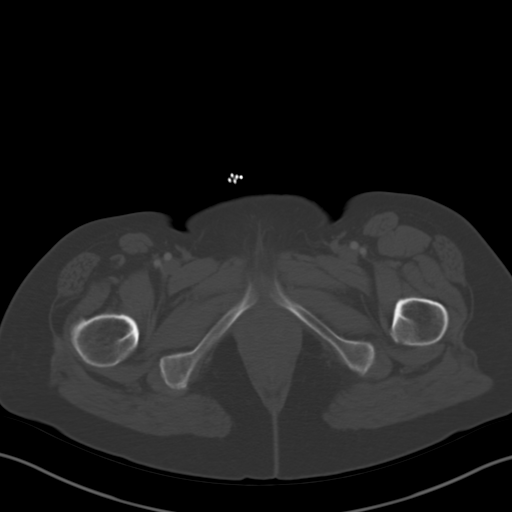
[im 12/75  soft-tissue]
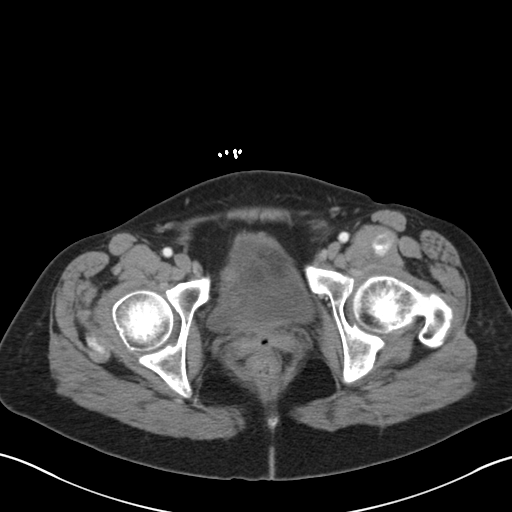
[im 16/75  soft-tissue]
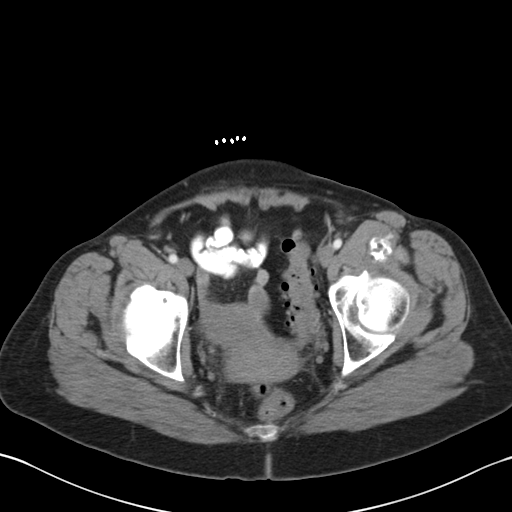
[im 20/75  soft-tissue]
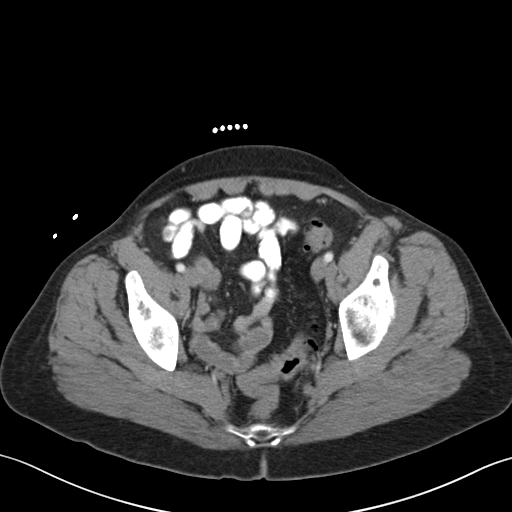
[im 28/75  soft-tissue]
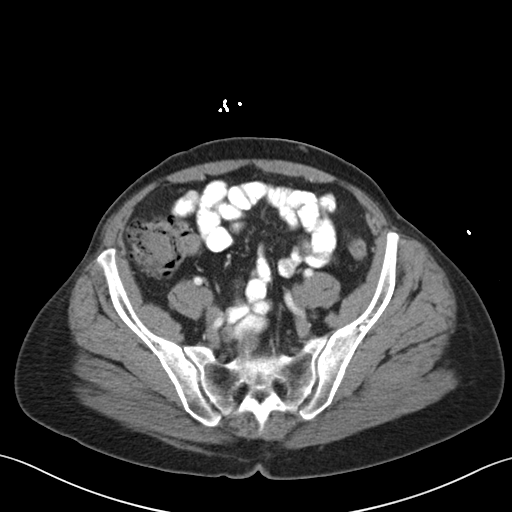
[im 32/75  soft-tissue]
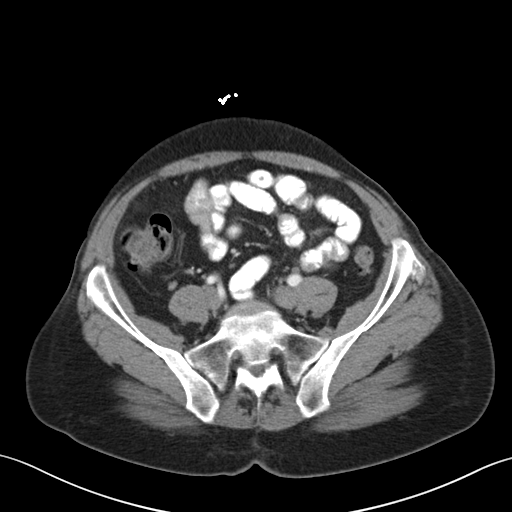
[im 39/75  soft-tissue]
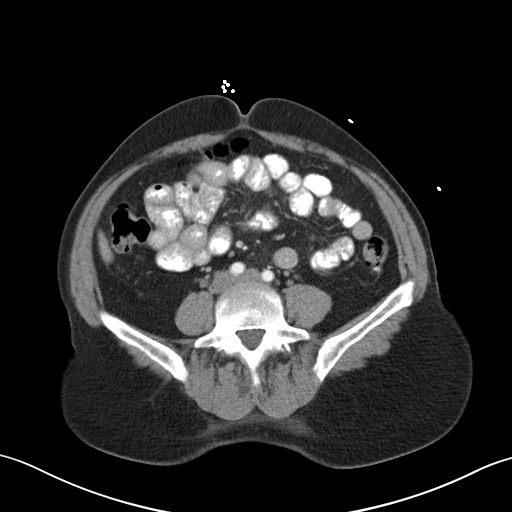
[im 43/75  soft-tissue]
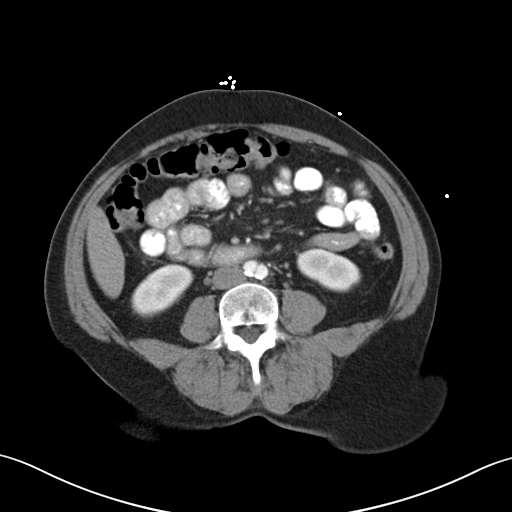
[im 47/75  soft-tissue]
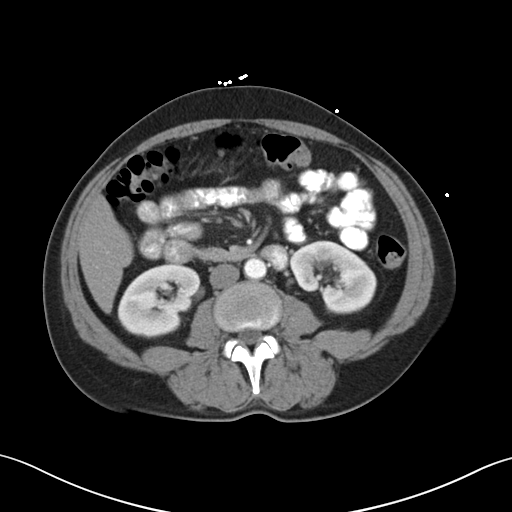
[im 47/75  bone]
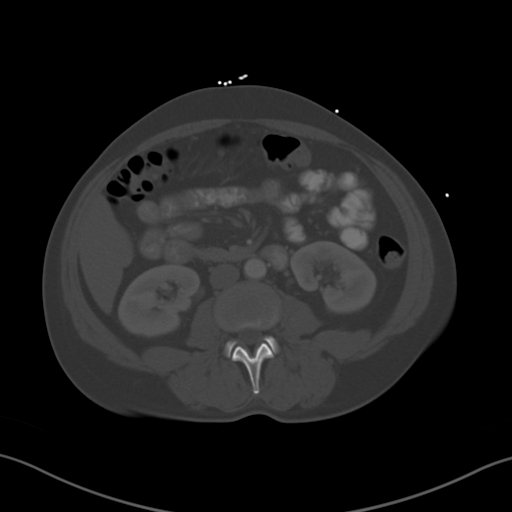
[im 55/75  soft-tissue]
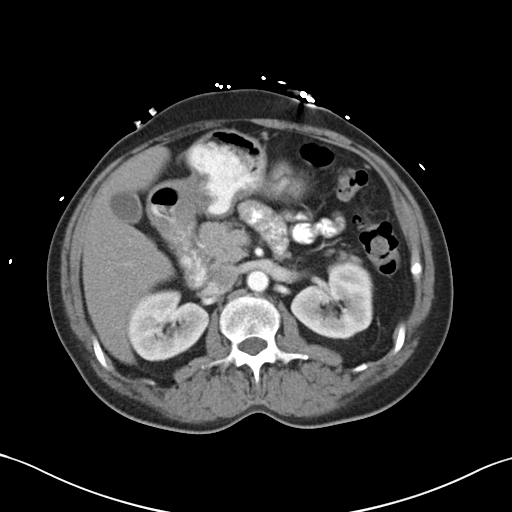
[im 59/75  soft-tissue]
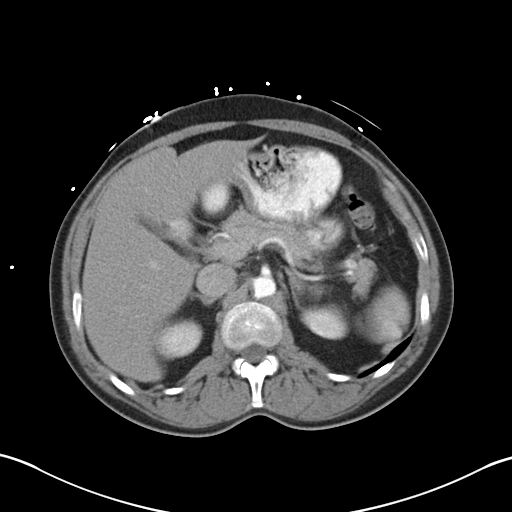
[im 59/75  lung]
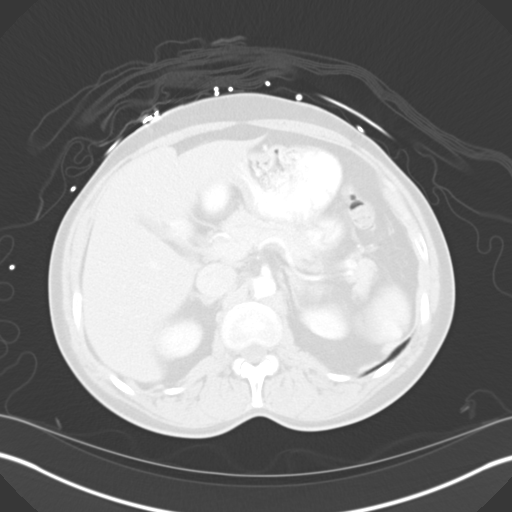
[im 63/75  soft-tissue]
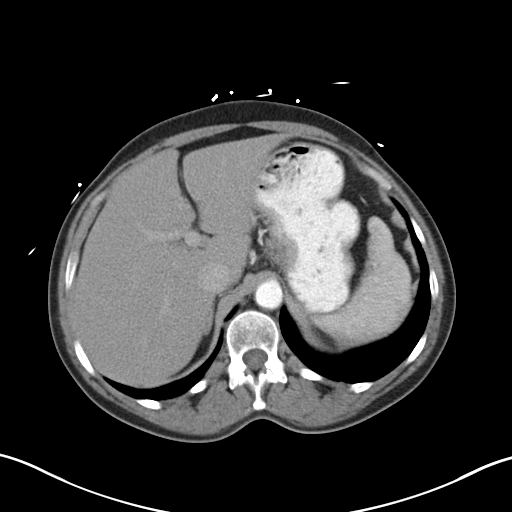
[im 63/75  lung]
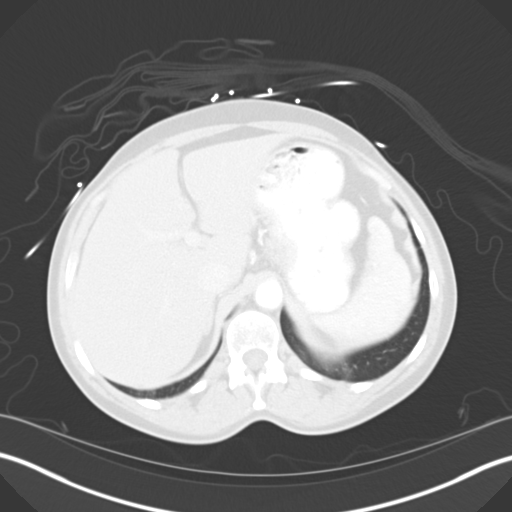
[im 67/75  lung]
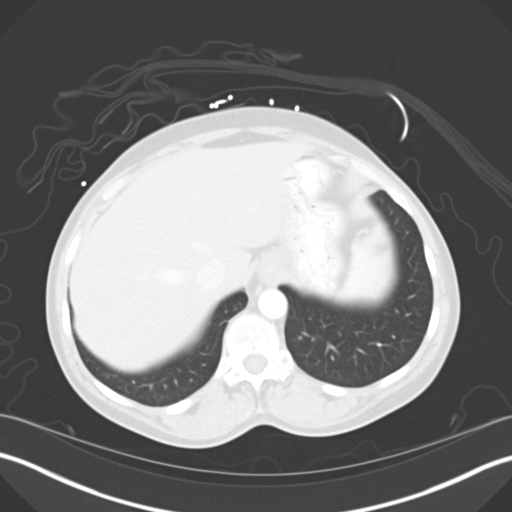
[im 71/75  soft-tissue]
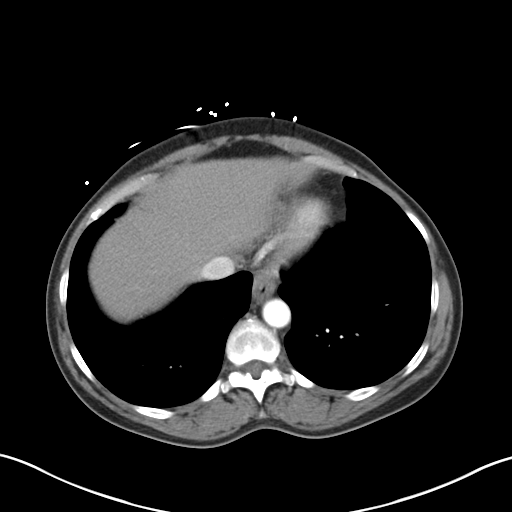
[im 71/75  lung]
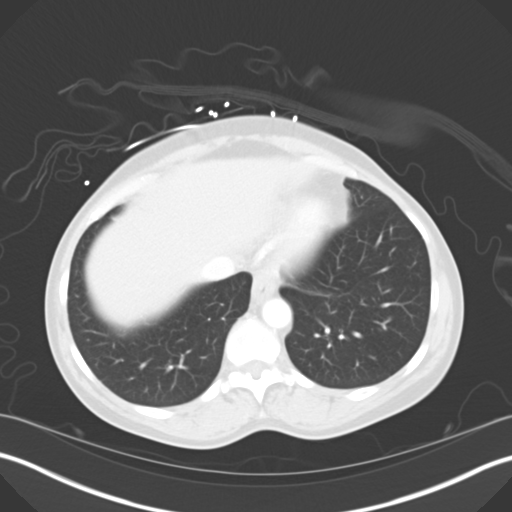

[14 of 32 positions shown; findings below may reference images not displayed]

FINDINGS: Images which include the lung bases are unremarkable.

The liver, spleen, stomach, duodenum, pancreas, and gallbladder are
unremarkable.  No evidence for adrenal mass.  Kidneys have normal
imaging features.

Mild circumferential wall thickening is noted in the distal
esophagus (see image seven of series 2).  This is probably related
to reflux esophagitis, but neoplasm could have this appearance.

No abdominal aortic aneurysm.  No free fluid or lymphadenopathy in
the abdomen.  The abdominal bowel loops have normal imaging
features.

Images through the pelvis show no intraperitoneal free fluid.
There is no pelvic sidewall lymphadenopathy.  Bladder is
unremarkable.  Uterus is normal.  No adnexal mass.

Diverticular disease is seen in the left colon without evidence for
diverticulitis the terminal ileum is normal. Nonvisualization of
the appendix is consistent with the reported history of
appendectomy.

2 x 2 x 3 cm calcification in the proximal left iliopsoas complex,
anterior to the hip, may be related to chronic bursal inflammation.
This could also be related to an old hematoma or muscular injury.
Bone windows reveal no worrisome lytic or sclerotic osseous
lesions.
IMPRESSION: No acute findings in the abdomen or pelvis.  Specifically, no
changes to explain the patient's reported history of rectal
bleeding.

Diverticular disease noted in the left colon without
diverticulitis.

Colonoscopy may be indicated and correlation with colorectal cancer
screening history recommended.

## 2014-11-08 ENCOUNTER — Ambulatory Visit: Payer: Medicaid Other | Admitting: Gastroenterology

## 2014-11-11 ENCOUNTER — Telehealth: Payer: Self-pay

## 2014-11-11 NOTE — Telephone Encounter (Signed)
Pt aware.

## 2014-11-11 NOTE — Telephone Encounter (Signed)
-----   Message from Algernon Huxley, RN sent at 10/28/2014  9:10 AM EDT ----- Regarding: Labs Pt needs cmp in 2 weeks, order in epic.

## 2014-11-15 NOTE — Addendum Note (Signed)
Addended by: Steva Ready on: 11/15/2014 03:56 PM   Modules accepted: Level of Service

## 2014-12-17 ENCOUNTER — Encounter: Payer: Self-pay | Admitting: *Deleted

## 2014-12-17 ENCOUNTER — Emergency Department (HOSPITAL_COMMUNITY): Payer: Medicaid Other

## 2014-12-17 ENCOUNTER — Inpatient Hospital Stay (HOSPITAL_COMMUNITY)
Admission: EM | Admit: 2014-12-17 | Discharge: 2014-12-19 | DRG: 433 | Disposition: A | Discharge status: Home / Self Care | Payer: Medicaid Other | Attending: Internal Medicine | Admitting: Internal Medicine

## 2014-12-17 DIAGNOSIS — Z8711 Personal history of peptic ulcer disease: Secondary | ICD-10-CM

## 2014-12-17 DIAGNOSIS — R7989 Other specified abnormal findings of blood chemistry: Secondary | ICD-10-CM | POA: Diagnosis not present

## 2014-12-17 DIAGNOSIS — M1990 Unspecified osteoarthritis, unspecified site: Secondary | ICD-10-CM | POA: Diagnosis present

## 2014-12-17 DIAGNOSIS — G894 Chronic pain syndrome: Secondary | ICD-10-CM | POA: Diagnosis present

## 2014-12-17 DIAGNOSIS — Z885 Allergy status to narcotic agent status: Secondary | ICD-10-CM

## 2014-12-17 DIAGNOSIS — Z96643 Presence of artificial hip joint, bilateral: Secondary | ICD-10-CM | POA: Diagnosis present

## 2014-12-17 DIAGNOSIS — Z6822 Body mass index (BMI) 22.0-22.9, adult: Secondary | ICD-10-CM

## 2014-12-17 DIAGNOSIS — K219 Gastro-esophageal reflux disease without esophagitis: Secondary | ICD-10-CM | POA: Diagnosis present

## 2014-12-17 DIAGNOSIS — Z7982 Long term (current) use of aspirin: Secondary | ICD-10-CM

## 2014-12-17 DIAGNOSIS — F17210 Nicotine dependence, cigarettes, uncomplicated: Secondary | ICD-10-CM | POA: Diagnosis present

## 2014-12-17 DIAGNOSIS — F329 Major depressive disorder, single episode, unspecified: Secondary | ICD-10-CM | POA: Diagnosis present

## 2014-12-17 DIAGNOSIS — K279 Peptic ulcer, site unspecified, unspecified as acute or chronic, without hemorrhage or perforation: Secondary | ICD-10-CM | POA: Diagnosis not present

## 2014-12-17 DIAGNOSIS — K7010 Alcoholic hepatitis without ascites: Secondary | ICD-10-CM | POA: Diagnosis not present

## 2014-12-17 DIAGNOSIS — E876 Hypokalemia: Secondary | ICD-10-CM | POA: Diagnosis present

## 2014-12-17 DIAGNOSIS — Z79899 Other long term (current) drug therapy: Secondary | ICD-10-CM | POA: Diagnosis not present

## 2014-12-17 DIAGNOSIS — F1010 Alcohol abuse, uncomplicated: Secondary | ICD-10-CM | POA: Diagnosis not present

## 2014-12-17 DIAGNOSIS — F1011 Alcohol abuse, in remission: Secondary | ICD-10-CM | POA: Diagnosis present

## 2014-12-17 DIAGNOSIS — E440 Moderate protein-calorie malnutrition: Secondary | ICD-10-CM | POA: Diagnosis not present

## 2014-12-17 DIAGNOSIS — R945 Abnormal results of liver function studies: Secondary | ICD-10-CM

## 2014-12-17 DIAGNOSIS — R079 Chest pain, unspecified: Secondary | ICD-10-CM

## 2014-12-17 DIAGNOSIS — E8729 Other acidosis: Secondary | ICD-10-CM | POA: Diagnosis present

## 2014-12-17 DIAGNOSIS — E872 Acidosis: Secondary | ICD-10-CM | POA: Diagnosis not present

## 2014-12-17 DIAGNOSIS — I10 Essential (primary) hypertension: Secondary | ICD-10-CM

## 2014-12-17 DIAGNOSIS — R112 Nausea with vomiting, unspecified: Secondary | ICD-10-CM | POA: Diagnosis present

## 2014-12-17 HISTORY — DX: Hypokalemia: E87.6

## 2014-12-17 LAB — APTT: aPTT: 43 seconds — ABNORMAL HIGH (ref 24–37)

## 2014-12-17 LAB — COMPREHENSIVE METABOLIC PANEL
ALT: 60 U/L — ABNORMAL HIGH (ref 14–54)
ANION GAP: 23 — AB (ref 5–15)
AST: 401 U/L — ABNORMAL HIGH (ref 15–41)
Albumin: 3.8 g/dL (ref 3.5–5.0)
Alkaline Phosphatase: 270 U/L — ABNORMAL HIGH (ref 38–126)
BUN: 7 mg/dL (ref 6–20)
CO2: 18 mmol/L — ABNORMAL LOW (ref 22–32)
Calcium: 8.5 mg/dL — ABNORMAL LOW (ref 8.9–10.3)
Chloride: 99 mmol/L — ABNORMAL LOW (ref 101–111)
Creatinine, Ser: 0.48 mg/dL (ref 0.44–1.00)
GFR calc Af Amer: >60 mL/min (ref >60)
GFR calc non Af Amer: >60 mL/min (ref >60)
Glucose, Bld: 59 mg/dL — ABNORMAL LOW (ref 65–99)
POTASSIUM: 3.5 mmol/L (ref 3.5–5.1)
SODIUM: 140 mmol/L (ref 135–145)
Total Bilirubin: 1.9 mg/dL — ABNORMAL HIGH (ref 0.3–1.2)
Total Protein: 6.8 g/dL (ref 6.5–8.1)

## 2014-12-17 LAB — URINE MICROSCOPIC-ADD ON

## 2014-12-17 LAB — CBC WITH DIFFERENTIAL/PLATELET
BASOS ABS: 0 10*3/uL (ref 0.0–0.1)
BASOS PCT: 1 % (ref 0–1)
EOS PCT: 0 % (ref 0–5)
Eosinophils Absolute: 0 10*3/uL (ref 0.0–0.7)
HEMATOCRIT: 35.2 % — AB (ref 36.0–46.0)
Hemoglobin: 11.4 g/dL — ABNORMAL LOW (ref 12.0–15.0)
Lymphocytes Relative: 32 % (ref 12–46)
Lymphs Abs: 1.3 10*3/uL (ref 0.7–4.0)
MCH: 32.4 pg (ref 26.0–34.0)
MCHC: 32.4 g/dL (ref 30.0–36.0)
MCV: 100 fL (ref 78.0–100.0)
MONO ABS: 0.3 10*3/uL (ref 0.1–1.0)
Monocytes Relative: 8 % (ref 3–12)
Neutro Abs: 2.4 10*3/uL (ref 1.7–7.7)
Neutrophils Relative %: 59 % (ref 43–77)
PLATELETS: 144 10*3/uL — AB (ref 150–400)
RBC: 3.52 MIL/uL — ABNORMAL LOW (ref 3.87–5.11)
RDW: 16 % — ABNORMAL HIGH (ref 11.5–15.5)
WBC: 4 10*3/uL (ref 4.0–10.5)

## 2014-12-17 LAB — URINALYSIS, ROUTINE W REFLEX MICROSCOPIC
Glucose, UA: NEGATIVE mg/dL
Hgb urine dipstick: NEGATIVE
Ketones, ur: >80 mg/dL — AB
Leukocytes, UA: NEGATIVE
NITRITE: NEGATIVE
PH: 6 (ref 5.0–8.0)
Protein, ur: 30 mg/dL — AB
Specific Gravity, Urine: 1.022 (ref 1.005–1.030)
UROBILINOGEN UA: 1 mg/dL (ref 0.0–1.0)

## 2014-12-17 LAB — PROTIME-INR
INR: 1.05 (ref 0.00–1.49)
PROTHROMBIN TIME: 13.8 s (ref 11.6–15.2)

## 2014-12-17 LAB — MAGNESIUM: MAGNESIUM: 1.5 mg/dL — AB (ref 1.7–2.4)

## 2014-12-17 LAB — I-STAT TROPONIN, ED: Troponin i, poc: 0 ng/mL (ref 0.00–0.08)

## 2014-12-17 LAB — VITAMIN B12: Vitamin B-12: 1082 pg/mL — ABNORMAL HIGH (ref 180–914)

## 2014-12-17 LAB — LIPASE, BLOOD: Lipase: 23 U/L (ref 22–51)

## 2014-12-17 LAB — PHOSPHORUS: PHOSPHORUS: 3.6 mg/dL (ref 2.5–4.6)

## 2014-12-17 LAB — CBG MONITORING, ED
Glucose-Capillary: 46 mg/dL — ABNORMAL LOW (ref 65–99)
Glucose-Capillary: 195 mg/dL — ABNORMAL HIGH (ref 65–99)

## 2014-12-17 MED ORDER — VITAMIN B-1 100 MG PO TABS
100.0000 mg | ORAL_TABLET | Freq: Every day | ORAL | Status: DC
  Administered 2014-12-17 – 2014-12-19 (×3): 100 mg via ORAL
  Filled 2014-12-17 (×3): qty 1

## 2014-12-17 MED ORDER — ONDANSETRON HCL 4 MG/2ML IJ SOLN
4.0000 mg | Freq: Once | INTRAMUSCULAR | Status: AC
  Administered 2014-12-17: 4 mg via INTRAVENOUS

## 2014-12-17 MED ORDER — POLYETHYLENE GLYCOL 3350 17 G PO PACK
17.0000 g | PACK | Freq: Every day | ORAL | Status: DC | PRN
  Administered 2014-12-18: 17 g via ORAL
  Filled 2014-12-17: qty 1

## 2014-12-17 MED ORDER — ONDANSETRON HCL 4 MG/2ML IJ SOLN
INTRAMUSCULAR | Status: AC
  Administered 2014-12-17: 4 mg via INTRAVENOUS
  Filled 2014-12-17: qty 2

## 2014-12-17 MED ORDER — PANTOPRAZOLE SODIUM 40 MG PO TBEC
40.0000 mg | DELAYED_RELEASE_TABLET | Freq: Every day | ORAL | Status: DC
  Administered 2014-12-17 – 2014-12-19 (×3): 40 mg via ORAL
  Filled 2014-12-17 (×3): qty 1

## 2014-12-17 MED ORDER — ONDANSETRON HCL 4 MG/2ML IJ SOLN
4.0000 mg | Freq: Once | INTRAMUSCULAR | Status: AC
  Administered 2014-12-17: 4 mg via INTRAVENOUS
  Filled 2014-12-17: qty 2

## 2014-12-17 MED ORDER — DOCUSATE SODIUM 100 MG PO CAPS
200.0000 mg | ORAL_CAPSULE | Freq: Every day | ORAL | Status: DC
  Administered 2014-12-17 – 2014-12-18 (×2): 200 mg via ORAL
  Filled 2014-12-17 (×2): qty 2

## 2014-12-17 MED ORDER — LORAZEPAM 1 MG PO TABS: 1.0000 mg | ORAL_TABLET | Freq: Four times a day (QID) | ORAL | Status: DC | PRN

## 2014-12-17 MED ORDER — LORAZEPAM 2 MG/ML IJ SOLN
1.0000 mg | Freq: Four times a day (QID) | INTRAMUSCULAR | Status: DC | PRN
  Administered 2014-12-17 – 2014-12-18 (×2): 1 mg via INTRAVENOUS
  Filled 2014-12-17 (×2): qty 1

## 2014-12-17 MED ORDER — DEXTROSE 50 % IV SOLN
1.0000 | Freq: Once | INTRAVENOUS | Status: AC
  Administered 2014-12-17: 50 mL via INTRAVENOUS
  Filled 2014-12-17: qty 50

## 2014-12-17 MED ORDER — THIAMINE HCL 100 MG/ML IJ SOLN
100.0000 mg | Freq: Every day | INTRAMUSCULAR | Status: DC
  Filled 2014-12-17 (×2): qty 1

## 2014-12-17 MED ORDER — DEXTROSE-NACL 5-0.45 % IV SOLN
INTRAVENOUS | Status: DC
  Administered 2014-12-17 – 2014-12-18 (×3): via INTRAVENOUS

## 2014-12-17 MED ORDER — CETYLPYRIDINIUM CHLORIDE 0.05 % MT LIQD
7.0000 mL | Freq: Two times a day (BID) | OROMUCOSAL | Status: DC
  Administered 2014-12-17 – 2014-12-19 (×4): 7 mL via OROMUCOSAL

## 2014-12-17 MED ORDER — KCL IN DEXTROSE-NACL 20-5-0.45 MEQ/L-%-% IV SOLN: INTRAVENOUS | Status: DC

## 2014-12-17 MED ORDER — POTASSIUM CHLORIDE 10 MEQ/100ML IV SOLN
10.0000 meq | INTRAVENOUS | Status: AC
  Administered 2014-12-17 (×2): 10 meq via INTRAVENOUS
  Filled 2014-12-17 (×2): qty 100

## 2014-12-17 MED ORDER — ADULT MULTIVITAMIN W/MINERALS CH
1.0000 | ORAL_TABLET | Freq: Every day | ORAL | Status: DC
  Administered 2014-12-17 – 2014-12-19 (×3): 1 via ORAL
  Filled 2014-12-17 (×3): qty 1

## 2014-12-17 MED ORDER — SODIUM CHLORIDE 0.9 % IV BOLUS (SEPSIS)
1000.0000 mL | Freq: Once | INTRAVENOUS | Status: AC
  Administered 2014-12-17: 1000 mL via INTRAVENOUS

## 2014-12-17 MED ORDER — FOLIC ACID 1 MG PO TABS
1.0000 mg | ORAL_TABLET | Freq: Every day | ORAL | Status: DC
  Administered 2014-12-17 – 2014-12-19 (×3): 1 mg via ORAL
  Filled 2014-12-17 (×3): qty 1

## 2014-12-17 MED ORDER — HEPARIN SODIUM (PORCINE) 5000 UNIT/ML IJ SOLN
5000.0000 [IU] | Freq: Three times a day (TID) | INTRAMUSCULAR | Status: DC
  Filled 2014-12-17 (×2): qty 1

## 2014-12-17 MED ORDER — DEXTROSE-NACL 5-0.45 % IV SOLN: INTRAVENOUS | Status: DC

## 2014-12-17 MED ORDER — ONDANSETRON HCL 4 MG/2ML IJ SOLN: 4.0000 mg | Freq: Four times a day (QID) | INTRAMUSCULAR | Status: DC | PRN

## 2014-12-17 MED ORDER — ENSURE ENLIVE PO LIQD
237.0000 mL | Freq: Two times a day (BID) | ORAL | Status: DC
  Administered 2014-12-18 – 2014-12-19 (×3): 237 mL via ORAL

## 2014-12-17 NOTE — ED Notes (Signed)
Pt states that she has been nervous, weak, and having sharp pains in her chest for about 2 weeks

## 2014-12-17 NOTE — ED Notes (Signed)
PA notified of glucose level, pt given cracker, pb and juice

## 2014-12-17 NOTE — ED Provider Notes (Signed)
CSN: [id number]     Arrival date & time 12/17/14  1556 History   First MD Initiated Contact with Patient 12/17/14 1622     Chief Complaint  Patient presents with  . Weakness     (Consider location/radiation/quality/duration/timing/severity/associated sxs/prior Treatment) Patient is a 54 y.o. female presenting with weakness. The history is provided by the patient and medical records.  Weakness Associated symptoms include abdominal pain, chest pain, fatigue, nausea, vomiting and weakness.   This is a 54 year old female with past medical history significant for depression, headaches, peptic ulcer disease, alcoholism, presenting to the ED for fatigue, generalized weakness, and intermittent chest pain for the past 2 weeks. States when pain occurs it is described as sharp and localized underneath her left breast. She denies any radiation of pain. Pain is not related to exertion.  No shortness of breath, diaphoresis, numbness, pain of arm/neck.  No hx of cardiac issues.  Father had MI and CAD.  She is a daily smoker but is trying to cut down-- last drink was yesterday but none today.  She does admit to some mild epigastric pain and nausea/vomiting as well.  She has known PUD, followed by Dr. [name].  States she is supposed to go back on June 10 so they can "check her liver".  She states she has cut down on her drinking because of this.  She denies any blood in her stools.  States urine has appeared darker in color than normal.  Past Medical History  Diagnosis Date  . Abnormal uterine bleeding   . Depression   . Arthritis   . Rectal bleeding     minor   . Headache(784.0)   . Macrocytic anemia   . PUD (peptic ulcer disease)   . Diverticulosis   . Internal hemorrhoids   . Mallory-Weiss tear   . Alcoholism   . Chronic pain syndrome   . Hyperplastic colon polyp   . LFT elevation   . Hypopotassemia    Past Surgical History  Procedure Laterality Date  . Appendectomy    . Oophorectomy    .  Shoulder surgery      left shoulder  . Colonoscopy    . 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 Orif periprosthetic fracture Right 05/28/2013    Procedure: OPEN REDUCTION INTERNAL FIXATION (ORIF) PERIPROSTHETIC FRACTURE;  Surgeon: Mauri Pole, MD;  Location: [facility];  Service: Orthopedics;  Laterality: Right;  . Esophagogastroduodenoscopy N/A 02/06/2014    Procedure: ESOPHAGOGASTRODUODENOSCOPY (EGD);  Surgeon: Milus Banister, MD;  Location: [facility];  Service: Endoscopy;  Laterality: N/A;  . Total hip arthroplasty      left hip   Family History  Problem Relation Age of Onset  . Diabetes Mother   . Kidney disease Mother   . Heart disease Father   . Colon cancer Father     questionable  . Breast cancer Sister    History  Substance Use Topics  . Smoking status: Current Every Day Smoker -- 0.50 packs/day for 25 years    Types: Cigarettes  . Smokeless tobacco: Never Used     Comment: Tobacco info given to pt. 08/16/12  . Alcohol Use: 1.8 oz/week    3 Shots of liquor per week   OB History    Gravida Para Term Preterm AB TAB SAB Ectopic Multiple Living  '2 1 1  1  1   1     ' Review of Systems  Constitutional: Positive for fatigue.  Cardiovascular: Positive for chest pain.  Gastrointestinal: Positive for nausea, vomiting and abdominal pain.  Neurological: Positive for weakness.  All other systems reviewed and are negative.     Allergies  Codeine and Morphine and related  Home Medications   Prior to Admission medications   Medication Sig Start Date End Date Taking? Authorizing Provider  aspirin EC 81 MG tablet Take 81 mg by mouth daily.   Yes Historical Provider, MD  Coenzyme Q10 (CO Q 10 PO) Take 5 mg by mouth daily.    Yes Historical Provider, MD  Multiple Vitamin (MULTI-VITAMINS) TABS Take 1 tablet by mouth daily.     Yes Historical Provider, MD  naproxen sodium (ANAPROX) 220 MG tablet Take 220 mg by mouth 2 (two) times daily as needed (pain).    Yes Historical Provider, MD  potassium chloride (K-DUR) 10 MEQ tablet Take 1 tablet (10 mEq total) by mouth once. 10/22/14  Yes Jeffrey Hedges, PA-C  ranitidine (ZANTAC) 150 MG capsule Take 150 mg by mouth daily as needed for heartburn.   Yes Historical Provider, MD  MOVIPREP 100 G SOLR Take 1 kit (200 g total) by mouth once. Patient not taking: Reported on 10/22/2014 03/14/14   Jerene Bears, MD  oxyCODONE (ROXICODONE) 5 MG immediate release tablet Take 1 tablet (5 mg total) by mouth every 6 (six) hours as needed for severe pain. Patient not taking: Reported on 10/17/2014 06/11/14   Cleatrice Burke, PA-C   BP 140/86 mmHg  Pulse 87  Temp(Src) 99.1 F (37.3 C) (Oral)  Resp 17  Ht '5\' 3"'  (1.6 m)  Wt 135 lb (61.236 kg)  BMI 23.92 kg/m2  SpO2 98%   Physical Exam  Constitutional: She is oriented to person, place, and time. She appears well-developed and well-nourished. No distress.  HENT:  Head: Normocephalic and atraumatic.  Mouth/Throat: Oropharynx is clear and moist.  Mildly dry mucous membranes  Eyes: Conjunctivae and EOM are normal. Pupils are equal, round, and reactive to light.  Neck: Normal range of motion. Neck supple.  Cardiovascular: Normal rate, regular rhythm and normal heart sounds.   Pulmonary/Chest: Effort normal and breath sounds normal. No respiratory distress. She has no wheezes.  Abdominal: Soft. Bowel sounds are normal. There is no tenderness. There is no guarding.  Musculoskeletal: Normal range of motion. She exhibits no edema.  Neurological: She is alert and oriented to person, place, and time.  Appears sleepy but fully AAOx3, no seizure activity  Skin: Skin is warm and dry. She is not diaphoretic.  Psychiatric: She has a normal mood and affect.  Nursing note and vitals reviewed.   ED Course  Procedures (including critical care  time) Labs Review Labs Reviewed  CBC WITH DIFFERENTIAL/PLATELET - Abnormal; Notable for the following:    RBC 3.52 (*)    Hemoglobin 11.4 (*)    HCT 35.2 (*)    RDW 16.0 (*)    Platelets 144 (*)    All other components within normal limits  COMPREHENSIVE METABOLIC PANEL - Abnormal; Notable for the following:    Chloride 99 (*)    CO2 18 (*)    Glucose, Bld 59 (*)    Calcium 8.5 (*)    AST 401 (*)    ALT 60 (*)    Alkaline Phosphatase 270 (*)    Total Bilirubin 1.9 (*)    Anion gap  23 (*)    All other components within normal limits  URINALYSIS, ROUTINE W REFLEX MICROSCOPIC - Abnormal; Notable for the following:    Color, Urine AMBER (*)    Bilirubin Urine SMALL (*)    Ketones, ur >80 (*)    Protein, ur 30 (*)    All other components within normal limits  APTT - Abnormal; Notable for the following:    aPTT 43 (*)    All other components within normal limits  URINE MICROSCOPIC-ADD ON - Abnormal; Notable for the following:    Squamous Epithelial / LPF FEW (*)    All other components within normal limits  CBG MONITORING, ED - Abnormal; Notable for the following:    Glucose-Capillary 46 (*)    All other components within normal limits  LIPASE, BLOOD  PROTIME-INR  I-STAT TROPOININ, ED    Imaging Review Dg Chest 2 View  12/17/2014   CLINICAL DATA:  Sharp chest pains.  EXAM: CHEST  2 VIEW  COMPARISON:  None.  FINDINGS: Mediastinum and hilar structures normal. Mild right base subsegmental atelectasis. Bilateral nipple shadows. Heart size normal. No pleural effusion or pneumothorax. Bony structures are stable. Stable deformity distal left clavicle.  IMPRESSION: Mild right base subsegmental atelectasis. Exam otherwise unremarkable.   Electronically Signed   By: Marcello Moores  Register   On: 12/17/2014 17:31     EKG Interpretation   Date/Time:  Tuesday Dec 17 2014 16:09:54 EDT Ventricular Rate:  91 PR Interval:  126 QRS Duration: 90 QT Interval:  385 QTC Calculation: 474 R Axis:    85 Text Interpretation:  Sinus rhythm Nonspecific T abnormalities, lateral  leads Baseline wander in lead(s) V2 No significant change since last  tracing Confirmed by POLLINA  MD, CHRISTOPHER [id number]) on 12/17/2014 4:36:29  PM      MDM   Final diagnoses:  Chest pain  Ketoacidosis  Alcohol abuse  Abnormal LFTs  Essential hypertension, benign   54 year old female here with generalized weakness, fatigue, and intermittent chest pain for the past 2 weeks. She has no known cardiac history. EKG sinus rhythm without acute ischemic changes. She is in no acute distress. She has had some nausea and vomiting throughout the day today but denies current abdominal pain. She does have history of alcohol abuse, last drink was yesterday. Lab work as above, troponin negative. Her liver enzymes remain elevated but improved from previous. Anion gap is elevated at 23.  Glucose is low, improved with D50.  U/a with >80 ketones.  Suspect ketoacidosis, likely related to her EtOH abuse.  Patient given IV fluids and anti-emetics with some improvement of symptoms initially, however returned after eating/drinking here in ED.  I assisted patient while walking to restroom, she appeared somewhat tremulous and complained of lightheadedness.  Feel she would benefit from admission as she may have some EtOH withdrawal contributing to her symptoms.  Patient placed in CIWA protocol, case discussed with Dr. Alcario Drought who evaluated in the ED and will admit for further management.  Larene Pickett, PA-C 12/17/14 2306  Wandra Arthurs, MD 12/17/14 [phone]

## 2014-12-17 NOTE — ED Notes (Signed)
Repeat glucose 195. Pt ambulatory to restroom with 1 assist

## 2014-12-17 NOTE — H&P (Addendum)
[facility] History and Physical  Jaime Phillips CVE:[id number] DOB: 1961/02/13 DOA: 12/17/2014  Referring physician: EDP PCP: [name], MD   Chief Complaint: N/V   HPI: Jaime Phillips is a 54 y.o. female with ongoing EtOH abuse history presents to ED with 2 week history of N/V.  Patient admits to essentially no PO intake of food other than EtOH for the past 2 weeks due to the vomiting.  Nausea and vomiting is intermittent but tends to occur when she tries to eat anything solid.  Vomit is non-bilious, is dark, she does not know if it contains blood.  No melena or BRBPR.  Last drink of EtOH was yesterday.  Has intermittent chest / abdominal pain for past 2 weeks located in LUQ.  No history of heart disease.  Known PUD followed by Dr. [name].  Supposed to go back on June 10th so they can "check her liver".  No history of DTz in past.  Review of Systems: positive for numbness and tingling in hands and feet.  Systems reviewed.  As above, otherwise negative  Past Medical History  Diagnosis Date  . Abnormal uterine bleeding   . Depression   . Arthritis   . Rectal bleeding     minor   . Headache(784.0)   . Macrocytic anemia   . PUD (peptic ulcer disease)   . Diverticulosis   . Internal hemorrhoids   . Mallory-Weiss tear   . Alcoholism   . Chronic pain syndrome   . Hyperplastic colon polyp   . LFT elevation   . Hypopotassemia    Past Surgical History  Procedure Laterality Date  . Appendectomy    . Oophorectomy    . Shoulder surgery      left shoulder  . Colonoscopy    . 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 Orif periprosthetic fracture Right 05/28/2013    Procedure: OPEN REDUCTION INTERNAL FIXATION (ORIF) PERIPROSTHETIC FRACTURE;  Surgeon: Mauri Pole, MD;  Location: [facility];  Service: Orthopedics;  Laterality:  Right;  . Esophagogastroduodenoscopy N/A 02/06/2014    Procedure: ESOPHAGOGASTRODUODENOSCOPY (EGD);  Surgeon: Milus Banister, MD;  Location: [place];  Service: Endoscopy;  Laterality: N/A;  . Total hip arthroplasty      left hip   Social History:  reports that she has been smoking Cigarettes.  She has a 12.5 pack-year smoking history. She has never used smokeless tobacco. She reports that she drinks about 1.8 oz of alcohol per week. She reports that she does not use illicit drugs.  Allergies  Allergen Reactions  . Codeine Other (See Comments)    hallucinating  . Morphine And Related     Made head feel funny    Family History  Problem Relation Age of Onset  . Diabetes Mother   . Kidney disease Mother   . Heart disease Father   . Colon cancer Father     questionable  . Breast cancer Sister      Prior to Admission medications   Medication Sig Start Date End Date Taking? Authorizing Provider  aspirin EC 81 MG tablet Take 81 mg by mouth daily.   Yes Historical Provider, MD  Coenzyme Q10 (CO Q 10 PO) Take 5 mg by mouth daily.    Yes Historical Provider, MD  Multiple Vitamin (MULTI-VITAMINS) TABS Take 1 tablet by mouth daily.  Yes Historical Provider, MD  naproxen sodium (ANAPROX) 220 MG tablet Take 220 mg by mouth 2 (two) times daily as needed (pain).    Yes Historical Provider, MD  potassium chloride (K-DUR) 10 MEQ tablet Take 1 tablet (10 mEq total) by mouth once. 10/22/14  Yes Jeffrey Hedges, PA-C  ranitidine (ZANTAC) 150 MG capsule Take 150 mg by mouth daily as needed for heartburn.   Yes Historical Provider, MD  MOVIPREP 100 G SOLR Take 1 kit (200 g total) by mouth once. Patient not taking: Reported on 10/22/2014 03/14/14   Jerene Bears, MD  oxyCODONE (ROXICODONE) 5 MG immediate release tablet Take 1 tablet (5 mg total) by mouth every 6 (six) hours as needed for severe pain. Patient not taking: Reported on 10/17/2014 06/11/14   Cleatrice Burke, PA-C   Physical Exam: Filed  Vitals:   12/17/14 1918  BP: 144/96  Pulse: 102  Temp:   Resp: 17    BP 144/96 mmHg  Pulse 102  Temp(Src) 99.1 F (37.3 C) (Oral)  Resp 17  Ht 5' 3" (1.6 m)  Wt 61.236 kg (135 lb)  BMI 23.92 kg/m2  SpO2 99%  General Appearance:    Alert, oriented, no distress, appears stated age  Head:    Normocephalic, atraumatic  Eyes:    PERRL, EOMI, sclera non-icteric        Nose:   Nares without drainage or epistaxis. Mucosa, turbinates normal  Throat:   Moist mucous membranes. Oropharynx without erythema or exudate.  Neck:   Supple. No carotid bruits.  No thyromegaly.  No lymphadenopathy.   Back:     No CVA tenderness, no spinal tenderness  Lungs:     Clear to auscultation bilaterally, without wheezes, rhonchi or rales  Chest wall:    No tenderness to palpitation  Heart:    Regular rate and rhythm without murmurs, gallops, rubs  Abdomen:     Soft, non-tender, nondistended, normal bowel sounds, no organomegaly  Genitalia:    deferred  Rectal:    deferred  Extremities:   No clubbing, cyanosis or edema.  Pulses:   2+ and symmetric all extremities  Skin:   Skin color, texture, turgor normal, no rashes or lesions  Lymph nodes:   Cervical, supraclavicular, and axillary nodes normal  Neurologic:   CNII-XII intact. Normal strength, sensation and reflexes      throughout    Labs on Admission:  Basic Metabolic Panel:  Recent Labs Lab 12/17/14 1712  NA 140  K 3.5  CL 99*  CO2 18*  GLUCOSE 59*  BUN 7  CREATININE 0.48  CALCIUM 8.5*   Liver Function Tests:  Recent Labs Lab 12/17/14 1712  AST 401*  ALT 60*  ALKPHOS 270*  BILITOT 1.9*  PROT 6.8  ALBUMIN 3.8    Recent Labs Lab 12/17/14 1712  LIPASE 23   No results for input(s): AMMONIA in the last 168 hours. CBC:  Recent Labs Lab 12/17/14 1712  WBC 4.0  NEUTROABS 2.4  HGB 11.4*  HCT 35.2*  MCV 100.0  PLT 144*   Cardiac Enzymes: No results for input(s): CKTOTAL, CKMB, CKMBINDEX, TROPONINI in the last 168  hours.  BNP (last 3 results) No results for input(s): PROBNP in the last 8760 hours. CBG:  Recent Labs Lab 12/17/14 1935  GLUCAP 35*    Radiological Exams on Admission: Dg Chest 2 View  12/17/2014   CLINICAL DATA:  Sharp chest pains.  EXAM: CHEST  2 VIEW  COMPARISON:  None.  FINDINGS: Mediastinum and hilar structures normal. Mild right base subsegmental atelectasis. Bilateral nipple shadows. Heart size normal. No pleural effusion or pneumothorax. Bony structures are stable. Stable deformity distal left clavicle.  IMPRESSION: Mild right base subsegmental atelectasis. Exam otherwise unremarkable.   Electronically Signed   By: Marcello Moores  Register   On: 12/17/2014 17:31    EKG: Independently reviewed.  Assessment/Plan Principal Problem:   Alcoholic hepatitis Active Problems:   Alcohol abuse   Alcoholic ketoacidosis   1. EtOH hepatitis - 1. Zofran PRN 2. Check gastrocult given the nausea and vomiting and her history 3. Repeat CMP tomorrow AM 4. Maddrey's is low, no indication for steroids 2. EtOH ketoacidosis - 1. D5 half 2. Check mg 3. Check po4 4. Repeat CMP in AM 5. K is borderline low at 3.5, will give 2 runs of IV K as per our usual that we would do for this level of potassium with DKA. 3. EtOH abuse - at risk for withdrawal 1. CIWA 2. B12 level ordered 3. Replace thiamine and folate per protocol 4. Tele monitor 4. PUD - 1. Putting patient on protonix PO 2. Checking gastrocult 3. SCDs only for DVT ppx, appears only very mildly anemic at this time with HGB of 11.2 on admission. 4. Recheck CBC in AM.    Code Status: Full Code  Family Communication: Family at bedside Disposition Plan: Admit to inpatient   Time spent: 815 pm to [place], [place] [facility] Pager [phone]  If 7AM-7PM, please contact the day team taking care of the patient [email or web address] Password [facility] 12/17/2014, 8:37 PM

## 2014-12-17 NOTE — ED Notes (Signed)
Patient transported to X-ray 

## 2014-12-17 NOTE — ED Notes (Signed)
Informed the 4th floor charge nurse and the ED nurse that the pt will be up in 20 min @ 2102 by QA

## 2014-12-18 DIAGNOSIS — E876 Hypokalemia: Secondary | ICD-10-CM

## 2014-12-18 DIAGNOSIS — E440 Moderate protein-calorie malnutrition: Secondary | ICD-10-CM | POA: Diagnosis present

## 2014-12-18 DIAGNOSIS — K7010 Alcoholic hepatitis without ascites: Principal | ICD-10-CM

## 2014-12-18 DIAGNOSIS — E872 Acidosis: Secondary | ICD-10-CM

## 2014-12-18 LAB — COMPREHENSIVE METABOLIC PANEL
ALBUMIN: 3.3 g/dL — AB (ref 3.5–5.0)
ALT: 55 U/L — AB (ref 14–54)
ANION GAP: 17 — AB (ref 5–15)
AST: 368 U/L — ABNORMAL HIGH (ref 15–41)
Alkaline Phosphatase: 253 U/L — ABNORMAL HIGH (ref 38–126)
BUN: 5 mg/dL — AB (ref 6–20)
CHLORIDE: 98 mmol/L — AB (ref 101–111)
CO2: 20 mmol/L — ABNORMAL LOW (ref 22–32)
Calcium: 7.9 mg/dL — ABNORMAL LOW (ref 8.9–10.3)
Creatinine, Ser: 0.5 mg/dL (ref 0.44–1.00)
GFR calc Af Amer: >60 mL/min (ref >60)
GFR calc non Af Amer: >60 mL/min (ref >60)
GLUCOSE: 151 mg/dL — AB (ref 65–99)
Potassium: 3.4 mmol/L — ABNORMAL LOW (ref 3.5–5.1)
SODIUM: 135 mmol/L (ref 135–145)
TOTAL PROTEIN: 6 g/dL — AB (ref 6.5–8.1)
Total Bilirubin: 3.2 mg/dL — ABNORMAL HIGH (ref 0.3–1.2)

## 2014-12-18 LAB — CBC
HCT: 32.7 % — ABNORMAL LOW (ref 36.0–46.0)
Hemoglobin: 10.4 g/dL — ABNORMAL LOW (ref 12.0–15.0)
MCH: 31.9 pg (ref 26.0–34.0)
MCHC: 31.8 g/dL (ref 30.0–36.0)
MCV: 100.3 fL — AB (ref 78.0–100.0)
Platelets: 121 10*3/uL — ABNORMAL LOW (ref 150–400)
RBC: 3.26 MIL/uL — ABNORMAL LOW (ref 3.87–5.11)
RDW: 16 % — AB (ref 11.5–15.5)
WBC: 4.6 10*3/uL (ref 4.0–10.5)

## 2014-12-18 MED ORDER — MAGNESIUM SULFATE 2 GM/50ML IV SOLN
2.0000 g | Freq: Once | INTRAVENOUS | Status: AC
  Administered 2014-12-18: 2 g via INTRAVENOUS
  Filled 2014-12-18: qty 50

## 2014-12-18 NOTE — Progress Notes (Signed)
Initial Nutrition Assessment  DOCUMENTATION CODES:  Non-severe (moderate) malnutrition in context of acute illness/injury, Non-severe (moderate) malnutrition in context of social or environmental circumstances  INTERVENTION:  Ensure Enlive (each supplement provides 350kcal and 20 grams of protein)  NUTRITION DIAGNOSIS:  Inadequate oral intake related to  (decreased appetite ) as evidenced by per patient/family report.  GOAL:  Patient will meet greater than or equal to 90% of their needs  MONITOR:  PO intake, Supplement acceptance, Weight trends, Labs  REASON FOR ASSESSMENT:  Malnutrition Screening Tool    ASSESSMENT: Per H&P, 54 y.o. female with ongoing EtOH abuse history presents to ED with 2 week history of N/V. Patient admits to essentially no PO intake of food other than EtOH for the past 2 weeks due to the vomiting. Nausea and vomiting is intermittent but tends to occur when she tries to eat anything solid.  Pt seen for MST: 2. BMI WDL. No intakes documented but pt reiterates above statement stating that she has not had anything to eat in 3 days. She states that her UBW is 135 lbs and that she lost weight within the past week. This would indicate 6 lb weight loss (4% body weight) in 1 week. Pt does not drink Ensure at home but Ensure Enlive is ordered BID. She drank 1/2 of a bottle this AM and wishes for it to be colder; discussed pouring it over ice. Pt states she ate some breakfast this AM and her appetite is better now than PTA.  Likely not meeting needs. Physical assessment shows mild fat and moderate muscle wasting. Labs and medications reviewed.  Height:  Ht Readings from Last 1 Encounters:  12/18/14 5\' 3"  (1.6 m)    Weight:  Wt Readings from Last 1 Encounters:  12/18/14 129 lb 6.6 oz (58.7 kg)    Ideal Body Weight:  52 kg (kg)  Wt Readings from Last 10 Encounters:  12/18/14 129 lb 6.6 oz (58.7 kg)  10/17/14 130 lb (58.968 kg)  10/03/14 130 lb (58.968  kg)  03/14/14 144 lb 6.4 oz (65.499 kg)  02/14/14 143 lb (64.864 kg)  02/06/14 140 lb 4.8 oz (63.64 kg)  01/17/14 142 lb (64.411 kg)  06/06/13 144 lb 3.2 oz (65.409 kg)  05/27/13 135 lb (61.236 kg)  04/10/13 139 lb (63.05 kg)    BMI:  Body mass index is 22.93 kg/(m^2).  Estimated Nutritional Needs:  Kcal:  1450-1650  Protein:  60-75 grams  Fluid:  2L/day  Skin:  WDL  Diet Order:  Diet regular Room service appropriate?: Yes; Fluid consistency:: Thin  EDUCATION NEEDS:  No education needs identified at this time   Intake/Output Summary (Last 24 hours) at 12/18/14 1224 Last data filed at 12/18/14 0609  Gross per 24 hour  Intake 841.25 ml  Output      0 ml  Net 841.25 ml    Last BM:  PTA   Jarome Matin, RD, LDN Inpatient Clinical Dietitian Pager # [phone] After hours/weekend pager # [phone]

## 2014-12-18 NOTE — Progress Notes (Signed)
[facility] PROGRESS NOTE  Jaime Phillips [id number] DOB: Aug 16, 1960 DOA: 12/17/2014 PCP: [name], MD  Brief narrative 54 year old alcoholic female , history of peptic ulcer disease presented with two-week history of nausea and vomiting with abdominal pain. Reports independent chest and left lower quadrant abdominal pain. Patient found to have acute alcoholic hepatitis and admitted to medical floor.   Assessment/Plan: Alcoholic hepatitis Supportive care with IV fluids, pain medications and antiemetics. Monitor LFTs closely. Maddrey's discriminant function is low so no steroid required. GI consult if unimproved. Counseled strongly on cessation. Social work consulted. -On CIWA protocol to monitor for alcohol withdrawal. Continue thiamine, folate and multivitamin.   Alcoholic ketoacidosis Continue with D5 half normal saline. Replenishing low potassium and mg. Phosphorus normal.  Peptic ulcer disease Continue PPI.  Protein calorie malnutrition, moderate Added nutrition supplements.  DVT prophylaxis: SCDs  Diet: regular  Code Status: full code Family Communication: None at bedside Disposition Plan: Continue inpatient monitoring. Discharge hme possibly in next 48-72 hrs    Consultants:  None  Procedures:  None  Antibiotics:  None  HPI/Subjective: Patient seen and examined. Denies nausea or vomiting at this time. Abdominal pain improved.  Objective: Filed Vitals:   12/18/14 1325  BP: 132/86  Pulse: 99  Temp: 98.9 F (37.2 C)  Resp: 16    Intake/Output Summary (Last 24 hours) at 12/18/14 1409 Last data filed at 12/18/14 1300  Gross per 24 hour  Intake 1321.25 ml  Output      0 ml  Net 1321.25 ml   Filed Weights   12/17/14 1617 12/17/14 2135 12/18/14 1219  Weight: 61.236 kg (135 lb) 58.695 kg (129 lb 6.4 oz) 58.7 kg (129 lb 6.6 oz)    Exam:   General:  Middle aged female in no acute distress  HEENT: Pallor present, no icterus, moist  oral mucosa, supple neck  Chest: Clear to auscultation bilaterally, no added sounds  CVS: S1 and S2, no murmurs rub or gallop  GI: Soft, nondistended, bowel sounds present, mild right upper quadrant and left lower quadrant tenderness  Musculoskeletal: Warm, no edema  CNS: Oriented, no tremors   Data Reviewed: Basic Metabolic Panel:  Recent Labs Lab 12/17/14 1712 12/17/14 1717 12/18/14 0500  NA 140  --  135  K 3.5  --  3.4*  CL 99*  --  98*  CO2 18*  --  20*  GLUCOSE 59*  --  151*  BUN 7  --  5*  CREATININE 0.48  --  0.50  CALCIUM 8.5*  --  7.9*  MG  --  1.5*  --   PHOS  --  3.6  --    Liver Function Tests:  Recent Labs Lab 12/17/14 1712 12/18/14 0500  AST 401* 368*  ALT 60* 55*  ALKPHOS 270* 253*  BILITOT 1.9* 3.2*  PROT 6.8 6.0*  ALBUMIN 3.8 3.3*    Recent Labs Lab 12/17/14 1712  LIPASE 23   No results for input(s): AMMONIA in the last 168 hours. CBC:  Recent Labs Lab 12/17/14 1712 12/18/14 0437  WBC 4.0 4.6  NEUTROABS 2.4  --   HGB 11.4* 10.4*  HCT 35.2* 32.7*  MCV 100.0 100.3*  PLT 144* 121*   Cardiac Enzymes: No results for input(s): CKTOTAL, CKMB, CKMBINDEX, TROPONINI in the last 168 hours. BNP (last 3 results) No results for input(s): BNP in the last 8760 hours.  ProBNP (last 3 results) No results for input(s): PROBNP in the last 8760 hours.  CBG:  Recent  Labs Lab 12/17/14 1935 12/17/14 2008  GLUCAP 46* 195*    No results found for this or any previous visit (from the past 240 hour(s)).   Studies: Dg Chest 2 View  12/17/2014   CLINICAL DATA:  Sharp chest pains.  EXAM: CHEST  2 VIEW  COMPARISON:  None.  FINDINGS: Mediastinum and hilar structures normal. Mild right base subsegmental atelectasis. Bilateral nipple shadows. Heart size normal. No pleural effusion or pneumothorax. Bony structures are stable. Stable deformity distal left clavicle.  IMPRESSION: Mild right base subsegmental atelectasis. Exam otherwise unremarkable.    Electronically Signed   By: Marcello Moores  Register   On: 12/17/2014 17:31    Scheduled Meds: . antiseptic oral rinse  7 mL Mouth Rinse BID  . docusate sodium  200 mg Oral QHS  . feeding supplement (ENSURE ENLIVE)  237 mL Oral BID BM  . folic acid  1 mg Oral Daily  . multivitamin with minerals  1 tablet Oral Daily  . pantoprazole  40 mg Oral Daily  . thiamine  100 mg Oral Daily   Or  . thiamine  100 mg Intravenous Daily   Continuous Infusions: . dextrose 5 % and 0.45% NaCl 75 mL/hr at 12/18/14 1008       Time spent: 25 minutes    Jaime Phillips, [place]  [facility] Pager [phone] If 7PM-7AM, please contact night-coverage at [email or web address], password [facility] 12/18/2014, 2:09 PM  LOS: 1 day

## 2014-12-18 NOTE — Care Management Note (Signed)
Case Management Note  Patient Details  Name: Terese Heier MRN: [id number] Date of Birth: [birth year]  Subjective/Objective:  54 yo F admitted from home with abdominal pain/N/V. Hx ETOH per H and P.                   Action/Plan:No anticipated needs, will continue to follow.    Expected Discharge Date:                  Expected Discharge Plan:  Home/Self Care  In-House Referral:     Discharge planning Services  CM Consult  Post Acute Care Choice:    Choice offered to:     DME Arranged:    DME Agency:     HH Arranged:    HH Agency:     Status of Service:  In process, will continue to follow  Medicare Important Message Given:    Date Medicare IM Given:    Medicare IM give by:    Date Additional Medicare IM Given:    Additional Medicare Important Message give by:     If discussed at [place] of Stay Meetings, dates discussed:    Additional Comments:  Dessa Phi, RN 12/18/2014, 9:35 AM

## 2014-12-19 DIAGNOSIS — E876 Hypokalemia: Secondary | ICD-10-CM | POA: Diagnosis present

## 2014-12-19 DIAGNOSIS — R7989 Other specified abnormal findings of blood chemistry: Secondary | ICD-10-CM

## 2014-12-19 LAB — COMPREHENSIVE METABOLIC PANEL
ALBUMIN: 3.3 g/dL — AB (ref 3.5–5.0)
ALT: 56 U/L — ABNORMAL HIGH (ref 14–54)
ANION GAP: 15 (ref 5–15)
AST: 294 U/L — ABNORMAL HIGH (ref 15–41)
Alkaline Phosphatase: 256 U/L — ABNORMAL HIGH (ref 38–126)
BUN: <5 mg/dL — ABNORMAL LOW (ref 6–20)
CALCIUM: 8.8 mg/dL — AB (ref 8.9–10.3)
CO2: 24 mmol/L (ref 22–32)
CREATININE: 0.42 mg/dL — AB (ref 0.44–1.00)
Chloride: 97 mmol/L — ABNORMAL LOW (ref 101–111)
GFR calc Af Amer: >60 mL/min (ref >60)
GFR calc non Af Amer: >60 mL/min (ref >60)
Glucose, Bld: 117 mg/dL — ABNORMAL HIGH (ref 65–99)
Potassium: 3.1 mmol/L — ABNORMAL LOW (ref 3.5–5.1)
Sodium: 136 mmol/L (ref 135–145)
TOTAL PROTEIN: 6.2 g/dL — AB (ref 6.5–8.1)
Total Bilirubin: 2 mg/dL — ABNORMAL HIGH (ref 0.3–1.2)

## 2014-12-19 MED ORDER — THIAMINE HCL 100 MG PO TABS: 100.0000 mg | ORAL_TABLET | Freq: Every day | ORAL | Status: DC

## 2014-12-19 MED ORDER — POTASSIUM CHLORIDE CRYS ER 20 MEQ PO TBCR
40.0000 meq | EXTENDED_RELEASE_TABLET | Freq: Once | ORAL | Status: AC
  Administered 2014-12-19: 40 meq via ORAL
  Filled 2014-12-19: qty 2

## 2014-12-19 MED ORDER — POTASSIUM CHLORIDE ER 20 MEQ PO TBCR: 20.0000 meq | EXTENDED_RELEASE_TABLET | Freq: Every day | ORAL | Status: DC

## 2014-12-19 MED ORDER — ENSURE ENLIVE PO LIQD: 237.0000 mL | Freq: Two times a day (BID) | ORAL | Status: DC

## 2014-12-19 MED ORDER — DOCUSATE SODIUM 100 MG PO CAPS: 200.0000 mg | ORAL_CAPSULE | Freq: Every day | ORAL | Status: DC | PRN

## 2014-12-19 MED ORDER — TRAMADOL HCL 50 MG PO TABS
50.0000 mg | ORAL_TABLET | Freq: Three times a day (TID) | ORAL | Status: DC | PRN
  Administered 2014-12-19: 50 mg via ORAL
  Filled 2014-12-19: qty 1

## 2014-12-19 MED ORDER — FOLIC ACID 1 MG PO TABS: 1.0000 mg | ORAL_TABLET | Freq: Every day | ORAL | Status: DC

## 2014-12-19 MED ORDER — IBUPROFEN 200 MG PO TABS: 400.0000 mg | ORAL_TABLET | Freq: Three times a day (TID) | ORAL | Status: DC | PRN

## 2014-12-19 MED ORDER — BISACODYL 10 MG RE SUPP
10.0000 mg | Freq: Every day | RECTAL | Status: DC
  Administered 2014-12-19: 10 mg via RECTAL
  Filled 2014-12-19: qty 1

## 2014-12-19 MED ORDER — POLYETHYLENE GLYCOL 3350 17 G PO PACK: 17.0000 g | PACK | Freq: Every day | ORAL | Status: DC

## 2014-12-19 NOTE — Discharge Summary (Addendum)
Physician Discharge Summary  Jaime Phillips [id number]:[id number] DOB: 04/17/1961 DOA: 12/17/2014  PCP: Jaime Marek, MD  Admit date: 12/17/2014 Discharge date: 12/19/2014  Time spent: 35 minutes  Recommendations for Outpatient Follow-up:  1. Discharge home [facility] 2.  please repeat LFTs during out follow up  Discharge Diagnoses:  Principal Problem:   Alcoholic hepatitis   Active Problems:   Gastro-esophageal reflux disease    Alcohol abuse   Abnormal LFTs   Alcoholic ketoacidosis   Malnutrition of moderate degree   Hypokalemia   Hypomagnesemia   Discharge Condition: fair  Diet recommendation: regular  Filed Weights   12/17/14 1617 12/17/14 2135 12/18/14 1219  Weight: 61.236 kg (135 lb) 58.695 kg (129 lb 6.4 oz) 58.7 kg (129 lb 6.6 oz)    History of present illness:  Please refer to admission H&P for details, in brief, 54 year old alcoholic female , history of peptic ulcer disease presented with two-week history of nausea and vomiting with abdominal pain. Reports independent chest and left lower quadrant abdominal pain. Patient found to have acute alcoholic hepatitis and admitted to medical floor.  Hospital Course:  Alcoholic hepatitis Supportive care provided  with IV fluids, and  antiemetics. Monitor LFTs  trending down. Maddrey's discriminant function is low so no steroid required. [place] abd was done 2 months back for similar symptoms showing fatty changes , so was not repeated.  Counseled strongly on cessation. Social work consulted. -no signs  alcohol withdrawal. Continue thiamine, folate and multivitamin. -pt has follow up appt with her GI ( Dr Jaime Phillips ) in 3 weeks. She will follow up with PCP in 1 week and should have her LFTs repeated. Recent hepatitis panel was negative.  -symptoms have markedly improved and pt is stable to be discharged home.    Alcoholic ketoacidosis Resolved with hydration.   Peptic  ulcer disease Continue ranitidine. Pt takes prn naprosyn for her arthritis symptoms. i have instructed her on minimizing use. Once LFTs improved she can take prn tylenol instead. Has allergy to narcotics ( codeine) which she cannot take.   Protein calorie malnutrition, moderate Added nutrition supplements.  Hypokalemia/ hypomagnesemia repleted   Diet: regular  Code Status: full code Family Communication: None at bedside Disposition Plan: home    Consultants:  None  Procedures:  None  Antibiotics:  None    Discharge Exam: Filed Vitals:   12/19/14 0443  BP: 126/77  Pulse: 94  Temp: 98.9 F (37.2 C)  Resp: 16    General: Middle aged female in no acute distress  HEENT: Pallor present, no icterus, moist oral mucosa, supple neck  Chest: Clear to auscultation bilaterally, no added sounds  CVS: S1 and S2, no murmurs rub or gallop  GI: Soft, nondistended, bowel sounds present, mild right upper quadrant  tenderness  Musculoskeletal: Warm, no edema  CNS: alert and Oriented, no tremors   Discharge Instructions    Current Discharge Medication List    START taking these medications   Details  docusate sodium (COLACE) 100 MG capsule Take 2 capsules (200 mg total) by mouth daily as needed for mild constipation. Qty: 20 capsule, Refills: 0    feeding supplement, ENSURE ENLIVE, (ENSURE ENLIVE) LIQD Take 237 mLs by mouth 2 (two) times daily between meals. Qty: 237 mL, Refills: 12    folic acid (FOLVITE) 1 MG tablet Take 1 tablet (1 mg total) by mouth daily. Qty: 30 tablet, Refills: 0    polyethylene glycol (MIRALAX / GLYCOLAX)  packet Take 17 g by mouth daily. Qty: 7 each, Refills: 0    thiamine 100 MG tablet Take 1 tablet (100 mg total) by mouth daily. Qty: 30 tablet, Refills: 0      CONTINUE these medications which have CHANGED   Details  potassium chloride 20 MEQ TBCR Take 20 mEq by mouth daily. Qty: 30 tablet, Refills: 0      CONTINUE these  medications which have NOT CHANGED   Details  aspirin EC 81 MG tablet Take 81 mg by mouth daily.    Coenzyme Q10 (CO Q 10 PO) Take 5 mg by mouth daily.     Multiple Vitamin (MULTI-VITAMINS) TABS Take 1 tablet by mouth daily.     naproxen sodium (ANAPROX) 220 MG tablet Take 220 mg by mouth 2 (two) times daily as needed (pain).     ranitidine (ZANTAC) 150 MG capsule Take 150 mg by mouth daily as needed for heartburn.       Allergies  Allergen Reactions  . Codeine Other (See Comments)    hallucinating  . Morphine And Related     Made head feel funny   Follow-up Information    Follow up with Jaime Marek, MD On 12/24/2014.   Specialty:  Internal Medicine   Why:  @ 4:15pm; Hospital Follow Up appointment and Lab work (Enzymes)   Contact information:   [address] [place] [address] [phone]       Follow up with Jerene Bears, MD On 01/10/2015.   Specialty:  Gastroenterology   Contact information:   [address] [place] [address] [phone]        The results of significant diagnostics from this hospitalization (including imaging, microbiology, ancillary and laboratory) are listed below for reference.    Significant Diagnostic Studies: Dg Chest 2 View  12/17/2014   CLINICAL DATA:  Sharp chest pains.  EXAM: CHEST  2 VIEW  COMPARISON:  None.  FINDINGS: Mediastinum and hilar structures normal. Mild right base subsegmental atelectasis. Bilateral nipple shadows. Heart size normal. No pleural effusion or pneumothorax. Bony structures are stable. Stable deformity distal left clavicle.  IMPRESSION: Mild right base subsegmental atelectasis. Exam otherwise unremarkable.   Electronically Signed   By: Marcello Moores  Register   On: 12/17/2014 17:31    Microbiology: No results found for this or any previous visit (from the past 240 hour(s)).   Labs: Basic Metabolic Panel:  Recent Labs Lab 12/17/14 1712 12/17/14 1717 12/18/14 0500 12/19/14 0420  NA 140  --   135 136  K 3.5  --  3.4* 3.1*  CL 99*  --  98* 97*  CO2 18*  --  20* 24  GLUCOSE 59*  --  151* 117*  BUN 7  --  5* <5*  CREATININE 0.48  --  0.50 0.42*  CALCIUM 8.5*  --  7.9* 8.8*  MG  --  1.5*  --   --   PHOS  --  3.6  --   --    Liver Function Tests:  Recent Labs Lab 12/17/14 1712 12/18/14 0500 12/19/14 0420  AST 401* 368* 294*  ALT 60* 55* 56*  ALKPHOS 270* 253* 256*  BILITOT 1.9* 3.2* 2.0*  PROT 6.8 6.0* 6.2*  ALBUMIN 3.8 3.3* 3.3*    Recent Labs Lab 12/17/14 1712  LIPASE 23   No results for input(s): AMMONIA in the last 168 hours. CBC:  Recent Labs Lab 12/17/14 1712 12/18/14 0437  WBC 4.0 4.6  NEUTROABS 2.4  --  HGB 11.4* 10.4*  HCT 35.2* 32.7*  MCV 100.0 100.3*  PLT 144* 121*   Cardiac Enzymes: No results for input(s): CKTOTAL, CKMB, CKMBINDEX, TROPONINI in the last 168 hours. BNP: BNP (last 3 results) No results for input(s): BNP in the last 8760 hours.  ProBNP (last 3 results) No results for input(s): PROBNP in the last 8760 hours.  CBG:  Recent Labs Lab 12/17/14 1935 12/17/14 2008  GLUCAP 46* 195*       Signed:  Cordae Mccarey  [facility] 12/19/2014, 10:15 AM

## 2014-12-19 NOTE — Discharge Instructions (Signed)
Alcohol Use Disorder Alcohol use disorder is a mental disorder. It is not a one-time incident of heavy drinking. Alcohol use disorder is the excessive and uncontrollable use of alcohol over time that leads to problems with functioning in one or more areas of daily living. People with this disorder risk harming themselves and others when they drink to excess. Alcohol use disorder also can cause other mental disorders, such as mood and anxiety disorders, and serious physical problems. People with alcohol use disorder often misuse other drugs.  Alcohol use disorder is common and widespread. Some people with this disorder drink alcohol to cope with or escape from negative life events. Others drink to relieve chronic pain or symptoms of mental illness. People with a family history of alcohol use disorder are at higher risk of losing control and using alcohol to excess.  SYMPTOMS  Signs and symptoms of alcohol use disorder may include the following:   Consumption ofalcohol inlarger amounts or over a longer period of time than intended.  Multiple unsuccessful attempts to cutdown or control alcohol use.   A great deal of time spent obtaining alcohol, using alcohol, or recovering from the effects of alcohol (hangover).  A strong desire or urge to use alcohol (cravings).   Continued use of alcohol despite problems at work, school, or home because of alcohol use.   Continued use of alcohol despite problems in relationships because of alcohol use.  Continued use of alcohol in situations when it is physically hazardous, such as driving a car.  Continued use of alcohol despite awareness of a physical or psychological problem that is likely related to alcohol use. Physical problems related to alcohol use can involve the brain, heart, liver, stomach, and intestines. Psychological problems related to alcohol use include intoxication, depression, anxiety, psychosis, delirium, and dementia.   The need for  increased amounts of alcohol to achieve the same desired effect, or a decreased effect from the consumption of the same amount of alcohol (tolerance).  Withdrawal symptoms upon reducing or stopping alcohol use, or alcohol use to reduce or avoid withdrawal symptoms. Withdrawal symptoms include:  Racing heart.  Hand tremor.  Difficulty sleeping.  Nausea.  Vomiting.  Hallucinations.  Restlessness.  Seizures. DIAGNOSIS Alcohol use disorder is diagnosed through an assessment by your health care provider. Your health care provider may start by asking three or four questions to screen for excessive or problematic alcohol use. To confirm a diagnosis of alcohol use disorder, at least two symptoms must be present within a 12-month period. The severity of alcohol use disorder depends on the number of symptoms:  Mild--two or three.  Moderate--four or five.  Severe--six or more. Your health care provider may perform a physical exam or use results from lab tests to see if you have physical problems resulting from alcohol use. Your health care provider may refer you to a mental health professional for evaluation. TREATMENT  Some people with alcohol use disorder are able to reduce their alcohol use to low-risk levels. Some people with alcohol use disorder need to quit drinking alcohol. When necessary, mental health professionals with specialized training in substance use treatment can help. Your health care provider can help you decide how severe your alcohol use disorder is and what type of treatment you need. The following forms of treatment are available:   Detoxification. Detoxification involves the use of prescription medicines to prevent alcohol withdrawal symptoms in the first week after quitting. This is important for people with a history of symptoms   of withdrawal and for heavy drinkers who are likely to have withdrawal symptoms. Alcohol withdrawal can be dangerous and, in severe cases, cause  death. Detoxification is usually provided in a hospital or in-patient substance use treatment facility.  Counseling or talk therapy. Talk therapy is provided by substance use treatment counselors. It addresses the reasons people use alcohol and ways to keep them from drinking again. The goals of talk therapy are to help people with alcohol use disorder find healthy activities and ways to cope with life stress, to identify and avoid triggers for alcohol use, and to handle cravings, which can cause relapse.  Medicines.Different medicines can help treat alcohol use disorder through the following actions:  Decrease alcohol cravings.  Decrease the positive reward response felt from alcohol use.  Produce an uncomfortable physical reaction when alcohol is used (aversion therapy).  Support groups. Support groups are run by people who have quit drinking. They provide emotional support, advice, and guidance. These forms of treatment are often combined. Some people with alcohol use disorder benefit from intensive combination treatment provided by specialized substance use treatment centers. Both inpatient and outpatient treatment programs are available. Document Released: 08/26/2004 Document Revised: 12/03/2013 Document Reviewed: 10/26/2012 ExitCare Patient Information 2015 [employer]. This information is not intended to replace advice given to you by your health care provider. Make sure you discuss any questions you have with your health care provider.  

## 2014-12-19 NOTE — Care Management Note (Signed)
Case Management Note  Patient Details  Name: Jaime Phillips MRN: [id number] Date of Birth: [birth year]  Subjective/Objective:                    Action/Plan:Discharged home. No needs/orders.   Expected Discharge Date:                  Expected Discharge Plan:  Home/Self Care  In-House Referral:     Discharge planning Services  Follow-up appt scheduled, CM Consult  Post Acute Care Choice:    Choice offered to:     DME Arranged:    DME Agency:     HH Arranged:    HH Agency:     Status of Service:  Completed, signed off  Medicare Important Message Given:    Date Medicare IM Given:    Medicare IM give by:    Date Additional Medicare IM Given:    Additional Medicare Important Message give by:     If discussed at [place] of Stay Meetings, dates discussed:    Additional Comments:  Dessa Phi, RN 12/19/2014, 1:25 PM

## 2014-12-19 NOTE — Evaluation (Signed)
Physical Therapy Evaluation Patient Details Name: Jaime Phillips MRN: [id number] DOB: [birth year] Today's Date: 12/19/2014   History of Present Illness  54 year old female with history of peptic ulcer disease, alcohol abuse, bil THA presented with two-week history of nausea and vomiting with abdominal pain and admitted for alcoholic hepatitis  Clinical Impression  Pt admitted with above diagnosis. Pt currently with functional limitations due to the deficits listed below (see PT Problem List).  Pt will benefit from skilled PT to increase their independence and safety with mobility to allow discharge to the venue listed below.  Pt mobilization slow but steady using RW.  Pt plans to d/c home and agreeable to use RW until feeling more stable.     Follow Up Recommendations No PT follow up    Equipment Recommendations  None recommended by PT    Recommendations for Other Services       Precautions / Restrictions Precautions Precautions: Fall      Mobility  Bed Mobility               General bed mobility comments: pt up in recliner on arrival  Transfers Overall transfer level: Needs assistance Equipment used: None Transfers: Sit to/from Stand Sit to Stand: Min guard         General transfer comment: min/guard for safety, performed standing without RW and then left RW behind when taking a couple steps to chair  Ambulation/Gait Ambulation/Gait assistance: Min guard Ambulation Distance (Feet): 400 Feet Assistive device: Rolling Sooy (2 wheeled) Gait Pattern/deviations: Step-through pattern;Decreased stride length Gait velocity: decr   General Gait Details: slow but steady gait with RW, pt agreeable to use upon d/c states she has one at home and uses it when unsteady due to hx falls at home HR in 110s during gait  Stairs            Wheelchair Mobility    Modified Rankin (Stroke Patients Only)       Balance Overall balance assessment: History of Falls                                            Pertinent Vitals/Pain Pain Assessment: No/denies pain    Home Living Family/patient expects to be discharged to:: Private residence Living Arrangements: Non-relatives/Friends Available Help at Discharge: Available PRN/intermittently   Home Access: Level entry     Home Layout: One level Home Equipment: Environmental consultant - 2 wheels;Bedside commode      Prior Function Level of Independence: Independent with assistive device(s)         Comments: uses RW when feeling unsteady     Hand Dominance        Extremity/Trunk Assessment               Lower Extremity Assessment: Overall WFL for tasks assessed      Cervical / Trunk Assessment: Normal  Communication   Communication: No difficulties  Cognition Arousal/Alertness: Awake/alert Behavior During Therapy: WFL for tasks assessed/performed Overall Cognitive Status: Within Functional Limits for tasks assessed                      General Comments      Exercises        Assessment/Plan    PT Assessment Patient needs continued PT services  PT Diagnosis Difficulty walking   PT Problem List Decreased balance;Decreased mobility;Decreased knowledge of use  of DME  PT Treatment Interventions DME instruction;Gait training;Functional mobility training;Patient/family education;Therapeutic activities;Therapeutic exercise;Stair training;Balance training   PT Goals (Current goals can be found in the Care Plan section) Acute Rehab PT Goals PT Goal Formulation: With patient Time For Goal Achievement: 12/26/14 Potential to Achieve Goals: Good    Frequency Min 3X/week   Barriers to discharge        Co-evaluation               End of Session Equipment Utilized During Treatment: Gait belt Activity Tolerance: Patient tolerated treatment well Patient left: in chair;with call bell/phone within reach;with chair alarm set           Time: 0951-1006 PT Time  Calculation (min) (ACUTE ONLY): 15 min   Charges:   PT Evaluation $Initial PT Evaluation Tier I: 1 Procedure     PT G Codes:        Marymargaret Kirker,KATHrine E 12/19/2014, 12:32 PM Carmelia Bake, PT, DPT 12/19/2014 Pager: [phone]

## 2014-12-19 NOTE — Progress Notes (Signed)
Patient is slightly confused. Patient thought that it was 4pm.  Patient was re-oriented to time, place and situation.

## 2014-12-19 NOTE — Progress Notes (Signed)
Went over discharge instructions with pt.  All questions answered.  Explained importance of going to follow up appointment and taking medications as prescribed.  VSS.  Will wheel pt out when ride arrives.

## 2014-12-24 ENCOUNTER — Encounter: Payer: Self-pay | Admitting: Internal Medicine

## 2014-12-24 ENCOUNTER — Ambulatory Visit: Payer: Medicaid Other | Attending: Internal Medicine | Admitting: Internal Medicine

## 2014-12-24 VITALS — BP 137/91 | HR 102 | Temp 98.5°F | Resp 16 | Ht 63.0 in | Wt 131.6 lb

## 2014-12-24 DIAGNOSIS — R945 Abnormal results of liver function studies: Secondary | ICD-10-CM | POA: Insufficient documentation

## 2014-12-24 DIAGNOSIS — E872 Acidosis: Secondary | ICD-10-CM

## 2014-12-24 DIAGNOSIS — K7010 Alcoholic hepatitis without ascites: Secondary | ICD-10-CM | POA: Diagnosis not present

## 2014-12-24 DIAGNOSIS — D539 Nutritional anemia, unspecified: Secondary | ICD-10-CM | POA: Insufficient documentation

## 2014-12-24 DIAGNOSIS — E876 Hypokalemia: Secondary | ICD-10-CM | POA: Diagnosis not present

## 2014-12-24 DIAGNOSIS — Z791 Long term (current) use of non-steroidal anti-inflammatories (NSAID): Secondary | ICD-10-CM | POA: Insufficient documentation

## 2014-12-24 DIAGNOSIS — G894 Chronic pain syndrome: Secondary | ICD-10-CM | POA: Diagnosis not present

## 2014-12-24 DIAGNOSIS — F17210 Nicotine dependence, cigarettes, uncomplicated: Secondary | ICD-10-CM | POA: Diagnosis not present

## 2014-12-24 DIAGNOSIS — Z7982 Long term (current) use of aspirin: Secondary | ICD-10-CM | POA: Diagnosis not present

## 2014-12-24 DIAGNOSIS — F17200 Nicotine dependence, unspecified, uncomplicated: Secondary | ICD-10-CM

## 2014-12-24 DIAGNOSIS — Z720 Tobacco use: Secondary | ICD-10-CM

## 2014-12-24 DIAGNOSIS — F1020 Alcohol dependence, uncomplicated: Secondary | ICD-10-CM | POA: Insufficient documentation

## 2014-12-24 DIAGNOSIS — E8729 Other acidosis: Secondary | ICD-10-CM

## 2014-12-24 DIAGNOSIS — M1990 Unspecified osteoarthritis, unspecified site: Secondary | ICD-10-CM | POA: Diagnosis not present

## 2014-12-24 DIAGNOSIS — Z96641 Presence of right artificial hip joint: Secondary | ICD-10-CM | POA: Diagnosis not present

## 2014-12-24 DIAGNOSIS — R7989 Other specified abnormal findings of blood chemistry: Secondary | ICD-10-CM

## 2014-12-24 NOTE — Progress Notes (Signed)
MRN: [id number] Name: Jaime Phillips  Sex: female Age: 54 y.o. DOB: [birth year]  Allergies: Codeine and Morphine and related  Chief Complaint  Patient presents with  . Hospitalization Follow-up    HPI: Patient is 54 y.o. female who  has history of alcohol abuse peptic ulcer disease, recently hospitalized with symptoms of nausea vomiting abdominal pain, EMR reviewed patient found to have AQ to quit hepatitis, supportive care was provided IV fluids antiemetic's, subsequently her LFTs improved, she did not require steroids, she was continued with thiamine folate and multivitamin, she was advised to follow with GI, patient had alcoholic ketoacidosis which resolved with IV hydration. Patient also is smoking cigarettes, counseled patient to quit smoking as per patient she has cut down on drinking, she also has history of arthritis status post hip surgery in the past, she needs the form to be filled out for handicap parking.  Past Medical History  Diagnosis Date  . Abnormal uterine bleeding   . Depression   . Arthritis   . Rectal bleeding     minor   . Headache(784.0)   . Macrocytic anemia   . PUD (peptic ulcer disease)   . Diverticulosis   . Internal hemorrhoids   . Mallory-Weiss tear   . Alcoholism   . Chronic pain syndrome   . Hyperplastic colon polyp   . LFT elevation   . Hypopotassemia     Past Surgical History  Procedure Laterality Date  . Appendectomy    . Oophorectomy    . Shoulder surgery      left shoulder  . Colonoscopy    . 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 Orif periprosthetic fracture Right 05/28/2013    Procedure: OPEN REDUCTION INTERNAL FIXATION (ORIF) PERIPROSTHETIC FRACTURE;  Surgeon: Mauri Pole, MD;  Location: [facility];  Service: Orthopedics;  Laterality: Right;  . Esophagogastroduodenoscopy N/A  02/06/2014    Procedure: ESOPHAGOGASTRODUODENOSCOPY (EGD);  Surgeon: Milus Banister, MD;  Location: [facility];  Service: Endoscopy;  Laterality: N/A;  . Total hip arthroplasty      left hip      Medication List       This list is accurate as of: 12/24/14  6:10 PM.  Always use your most recent med list.               aspirin EC 81 MG tablet  Take 81 mg by mouth daily.     CO Q 10 PO  Take 5 mg by mouth daily.     docusate sodium 100 MG capsule  Commonly known as:  COLACE  Take 2 capsules (200 mg total) by mouth daily as needed for mild constipation.     feeding supplement (ENSURE ENLIVE) Liqd  Take 237 mLs by mouth 2 (two) times daily between meals.     folic acid 1 MG tablet  Commonly known as:  FOLVITE  Take 1 tablet (1 mg total) by mouth daily.     MULTI-VITAMINS Tabs  Take 1 tablet by mouth daily.     naproxen sodium 220 MG tablet  Commonly known as:  ANAPROX  Take 220 mg by mouth 2 (two) times daily as needed (pain).     polyethylene glycol packet  Commonly known as:  MIRALAX / GLYCOLAX  Take 17 g by mouth daily.     Potassium Chloride  ER 20 MEQ Tbcr  Take 20 mEq by mouth daily.     ranitidine 150 MG capsule  Commonly known as:  ZANTAC  Take 150 mg by mouth daily as needed for heartburn.     thiamine 100 MG tablet  Take 1 tablet (100 mg total) by mouth daily.        No orders of the defined types were placed in this encounter.     There is no immunization history on file for this patient.  Family History  Problem Relation Age of Onset  . Diabetes Mother   . Kidney disease Mother   . Heart disease Father   . Colon cancer Father     questionable  . Breast cancer Sister     History  Substance Use Topics  . Smoking status: Current Every Day Smoker -- 0.50 packs/day for 25 years    Types: Cigarettes  . Smokeless tobacco: Never Used     Comment: Tobacco info given to pt. 08/16/12  . Alcohol Use: 1.8 oz/week    3 Shots of liquor per week     Review of Systems   As noted in HPI  Filed Vitals:   12/24/14 1637  BP: 137/91  Pulse: 102  Temp: 98.5 F (36.9 C)  Resp: 16    Physical Exam  Physical Exam  Constitutional: No distress.  Eyes: EOM are normal. Pupils are equal, round, and reactive to light.  Cardiovascular: Normal rate and regular rhythm.   Pulmonary/Chest: Breath sounds normal. No respiratory distress. She has no wheezes. She has no rales.  Abdominal: Soft. There is no tenderness. There is no rebound.    CBC    Component Value Date/Time   WBC 4.6 12/18/2014 0437   RBC 3.26* 12/18/2014 0437   RBC 2.45* 05/28/2013 0500   HGB 10.4* 12/18/2014 0437   HCT 32.7* 12/18/2014 0437   PLT 121* 12/18/2014 0437   MCV 100.3* 12/18/2014 0437   LYMPHSABS 1.3 12/17/2014 1712   MONOABS 0.3 12/17/2014 1712   EOSABS 0.0 12/17/2014 1712   BASOSABS 0.0 12/17/2014 1712    CMP     Component Value Date/Time   NA 136 12/19/2014 0420   K 3.1* 12/19/2014 0420   CL 97* 12/19/2014 0420   CO2 24 12/19/2014 0420   GLUCOSE 117* 12/19/2014 0420   BUN <5* 12/19/2014 0420   CREATININE 0.42* 12/19/2014 0420   CREATININE 0.67 01/17/2014 1638   CALCIUM 8.8* 12/19/2014 0420   PROT 6.2* 12/19/2014 0420   ALBUMIN 3.3* 12/19/2014 0420   AST 294* 12/19/2014 0420   ALT 56* 12/19/2014 0420   ALKPHOS 256* 12/19/2014 0420   BILITOT 2.0* 12/19/2014 0420   GFRNONAA >60 12/19/2014 0420   GFRNONAA >89 01/17/2014 1638   GFRAA >60 12/19/2014 0420   GFRAA >89 01/17/2014 1638    Lab Results  Component Value Date/Time   CHOL 224* 01/17/2014 04:38 PM    Lab Results  Component Value Date/Time   HGBA1C 4.9 01/17/2014 05:52 PM    Lab Results  Component Value Date/Time   AST 294* 12/19/2014 04:20 AM    Assessment and Plan  Arthritis Pain medication when necessary  Alcoholic ketoacidosis Resolved status post hydration  Abnormal LFTs Will repeat her LFTs . Alcoholic hepatitis without ascites Counseled patient for  abstinence from drinking alcohol, patient is scheduled to follow with GI  Hypokalemia - Plan: COMPLETE METABOLIC PANEL WITH GFR  Smoking Consults patient to quit smoking.  Follow up in 3 months, as  needed  This note has been created with Surveyor, quantity. Any transcriptional errors are unintentional.    [name], MD

## 2014-12-24 NOTE — Patient Instructions (Signed)
Smoking Cessation Quitting smoking is important to your health and has many advantages. However, it is not always easy to quit since nicotine is a very addictive drug. Oftentimes, people try 3 times or more before being able to quit. This document explains the best ways for you to prepare to quit smoking. Quitting takes hard work and a lot of effort, but you can do it. ADVANTAGES OF QUITTING SMOKING  You will live longer, feel better, and live better.  Your body will feel the impact of quitting smoking almost immediately.  Within 20 minutes, blood pressure decreases. Your pulse returns to its normal level.  After 8 hours, carbon monoxide levels in the blood return to normal. Your oxygen level increases.  After 24 hours, the chance of having a heart attack starts to decrease. Your breath, hair, and body stop smelling like smoke.  After 48 hours, damaged nerve endings begin to recover. Your sense of taste and smell improve.  After 72 hours, the body is virtually free of nicotine. Your bronchial tubes relax and breathing becomes easier.  After 2 to 12 weeks, lungs can hold more air. Exercise becomes easier and circulation improves.  The risk of having a heart attack, stroke, cancer, or lung disease is greatly reduced.  After 1 year, the risk of coronary heart disease is cut in half.  After 5 years, the risk of stroke falls to the same as a nonsmoker.  After 10 years, the risk of lung cancer is cut in half and the risk of other cancers decreases significantly.  After 15 years, the risk of coronary heart disease drops, usually to the level of a nonsmoker.  If you are pregnant, quitting smoking will improve your chances of having a healthy baby.  The people you live with, especially any children, will be healthier.  You will have extra money to spend on things other than cigarettes. QUESTIONS TO THINK ABOUT BEFORE ATTEMPTING TO QUIT You may want to talk about your answers with your  health care provider.  Why do you want to quit?  If you tried to quit in the past, what helped and what did not?  What will be the most difficult situations for you after you quit? How will you plan to handle them?  Who can help you through the tough times? Your family? Friends? A health care provider?  What pleasures do you get from smoking? What ways can you still get pleasure if you quit? Here are some questions to ask your health care provider:  How can you help me to be successful at quitting?  What medicine do you think would be best for me and how should I take it?  What should I do if I need more help?  What is smoking withdrawal like? How can I get information on withdrawal? GET READY  Set a quit date.  Change your environment by getting rid of all cigarettes, ashtrays, matches, and lighters in your home, car, or work. Do not let people smoke in your home.  Review your past attempts to quit. Think about what worked and what did not. GET SUPPORT AND ENCOURAGEMENT You have a better chance of being successful if you have help. You can get support in many ways.  Tell your family, friends, and coworkers that you are going to quit and need their support. Ask them not to smoke around you.  Get individual, group, or telephone counseling and support. Programs are available at local hospitals and health centers. Call   your local health department for information about programs in your area.  Spiritual beliefs and practices may help some smokers quit.  Download a "quit meter" on your computer to keep track of quit statistics, such as how long you have gone without smoking, cigarettes not smoked, and money saved.  Get a self-help book about quitting smoking and staying off tobacco. LEARN NEW SKILLS AND BEHAVIORS  Distract yourself from urges to smoke. Talk to someone, go for a walk, or occupy your time with a task.  Change your normal routine. Take a different route to work.  Drink tea instead of coffee. Eat breakfast in a different place.  Reduce your stress. Take a hot bath, exercise, or read a book.  Plan something enjoyable to do every day. Reward yourself for not smoking.  Explore interactive web-based programs that specialize in helping you quit. GET MEDICINE AND USE IT CORRECTLY Medicines can help you stop smoking and decrease the urge to smoke. Combining medicine with the above behavioral methods and support can greatly increase your chances of successfully quitting smoking.  Nicotine replacement therapy helps deliver nicotine to your body without the negative effects and risks of smoking. Nicotine replacement therapy includes nicotine gum, lozenges, inhalers, nasal sprays, and skin patches. Some may be available over-the-counter and others require a prescription.  Antidepressant medicine helps people abstain from smoking, but how this works is unknown. This medicine is available by prescription.  Nicotinic receptor partial agonist medicine simulates the effect of nicotine in your brain. This medicine is available by prescription. Ask your health care provider for advice about which medicines to use and how to use them based on your health history. Your health care provider will tell you what side effects to look out for if you choose to be on a medicine or therapy. Carefully read the information on the package. Do not use any other product containing nicotine while using a nicotine replacement product.  RELAPSE OR DIFFICULT SITUATIONS Most relapses occur within the first 3 months after quitting. Do not be discouraged if you start smoking again. Remember, most people try several times before finally quitting. You may have symptoms of withdrawal because your body is used to nicotine. You may crave cigarettes, be irritable, feel very hungry, cough often, get headaches, or have difficulty concentrating. The withdrawal symptoms are only temporary. They are strongest  when you first quit, but they will go away within 10-14 days. To reduce the chances of relapse, try to:  Avoid drinking alcohol. Drinking lowers your chances of successfully quitting.  Reduce the amount of caffeine you consume. Once you quit smoking, the amount of caffeine in your body increases and can give you symptoms, such as a rapid heartbeat, sweating, and anxiety.  Avoid smokers because they can make you want to smoke.  Do not let weight gain distract you. Many smokers will gain weight when they quit, usually less than 10 pounds. Eat a healthy diet and stay active. You can always lose the weight gained after you quit.  Find ways to improve your mood other than smoking. FOR MORE INFORMATION  www.smokefree.gov  Document Released: 07/13/2001 Document Revised: 12/03/2013 Document Reviewed: 10/28/2011 Exit[employer] Patient Information 2015 [employer]. This information is not intended to replace advice given to you by your health care provider. Make sure you discuss any questions you have with your health care provider.  

## 2014-12-24 NOTE — Progress Notes (Signed)
Patient states she is here for follow up from hospital Patient states she was diagnosed with ketoacidosis Patient is still smoking and having some drinks on a daily basis Patient is still feeling nervous and weak

## 2014-12-25 LAB — COMPLETE METABOLIC PANEL WITH GFR
ALBUMIN: 3.5 g/dL (ref 3.5–5.2)
ALK PHOS: 265 U/L — AB (ref 39–117)
ALT: 99 U/L — ABNORMAL HIGH (ref 0–35)
AST: 452 U/L — AB (ref 0–37)
BILIRUBIN TOTAL: 1.5 mg/dL — AB (ref 0.2–1.2)
BUN: 10 mg/dL (ref 6–23)
CALCIUM: 8.7 mg/dL (ref 8.4–10.5)
CO2: 25 mEq/L (ref 19–32)
Chloride: 102 mEq/L (ref 96–112)
Creat: 0.47 mg/dL — ABNORMAL LOW (ref 0.50–1.10)
GFR, Est African American: >89 mL/min
GFR, Est Non African American: >89 mL/min
Glucose, Bld: 84 mg/dL (ref 70–99)
POTASSIUM: 4.4 meq/L (ref 3.5–5.3)
Sodium: 139 mEq/L (ref 135–145)
Total Protein: 6.3 g/dL (ref 6.0–8.3)

## 2015-01-01 ENCOUNTER — Telehealth: Payer: Self-pay

## 2015-01-01 NOTE — Telephone Encounter (Signed)
Patient is aware of her lab results 

## 2015-01-01 NOTE — Telephone Encounter (Signed)
-----   Message from [name], MD sent at 12/25/2014 10:32 AM EDT ----- Blood work reviewed, call and let the patient know that her potassium level is in normal range, there is worsening of  liver enzymes, advise  patient not to drink alcohol and avoid tylenol. Follow with GI.

## 2015-01-10 ENCOUNTER — Ambulatory Visit: Payer: Medicaid Other | Admitting: Internal Medicine

## 2015-01-29 ENCOUNTER — Ambulatory Visit (INDEPENDENT_AMBULATORY_CARE_PROVIDER_SITE_OTHER): Payer: Medicaid Other | Admitting: Gastroenterology

## 2015-01-29 ENCOUNTER — Encounter: Payer: Self-pay | Admitting: Gastroenterology

## 2015-01-29 VITALS — BP 136/76 | HR 110 | Ht 63.0 in | Wt 128.0 lb

## 2015-01-29 DIAGNOSIS — R1314 Dysphagia, pharyngoesophageal phase: Secondary | ICD-10-CM | POA: Diagnosis not present

## 2015-01-29 DIAGNOSIS — K2210 Ulcer of esophagus without bleeding: Secondary | ICD-10-CM

## 2015-01-29 DIAGNOSIS — R1013 Epigastric pain: Secondary | ICD-10-CM | POA: Diagnosis not present

## 2015-01-29 DIAGNOSIS — K7010 Alcoholic hepatitis without ascites: Secondary | ICD-10-CM

## 2015-01-29 DIAGNOSIS — R112 Nausea with vomiting, unspecified: Secondary | ICD-10-CM | POA: Diagnosis not present

## 2015-01-29 DIAGNOSIS — R1310 Dysphagia, unspecified: Secondary | ICD-10-CM | POA: Insufficient documentation

## 2015-01-29 MED ORDER — OMEPRAZOLE 40 MG PO CPDR: 40.0000 mg | DELAYED_RELEASE_CAPSULE | Freq: Two times a day (BID) | ORAL | Status: DC

## 2015-01-29 NOTE — Progress Notes (Signed)
     01/29/2015 Dorothee Napierkowski [id number] 1960-12-03   History of Present Illness:  This is a 54 year old female who is known to Dr. [name].  She has a history of diverticulosis, anxiety/depression, alcohol use/ETOH hepatitis, chronic pain, arthritis status post left total hip replacement. She came for colonoscopy on 08/21/2012 this revealed a poor right-sided preparation. Two-day bowel prep was recommended with a followup colonoscopy which she canceled. She was hospitalized in early July 2015 and evaluated by the inpatient GI consult team for hematemesis. She had upper endoscopy performed by Dr. Ardis Hughs on 02/06/2014. 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 She was treated with twice a day PPI and at follow-up in 03/2014 she was feeling well.  Repeat colonoscopy in 10/2014 showed moderate diverticulosis only.  She presents to our office today with complaints of epigastric abdominal pain, nausea, vomiting, and some sensation that food is getting stuck.  She says that she cannot keep anything down and she has in fact lost 16 pounds since her visit her visit here 11 months ago, but she does continue to drink ETOH.  Her LFT's are elevated.  She had full liver serology evaluation by Dr. [name] that was otherwise normal/negative.  She is no longer on PPI and is only taking Zantac prn, but has not taken any recently.  She denies taking any NSAID's.   Current Medications, Allergies, Past Medical History, Past Surgical History, Family History and Social History were reviewed in [employer] record.   Physical Exam: BP 136/76 mmHg  Pulse 110  Ht 5\' 3"  (1.6 m)  Wt 128 lb (58.06 kg)  BMI 22.68 kg/m2 General: Well developed female in no acute distress, but appears somewhat anxious and tearful Head: Normocephalic and atraumatic Eyes:  Sclerae anicteric, conjunctiva pink  Ears: Normal auditory acuity Lungs: Clear  throughout to auscultation Heart: Tachycardic Abdomen: Soft, non-distended.  BS present.  Epigastric TTP without R/R/G. Musculoskeletal: Symmetrical with no gross deformities  Extremities: No edema  Neurological: Alert oriented x 4, grossly non-focal Psychological:  Alert and cooperative. Normal mood and affect  Assessment and Recommendations: -Epigastric abdominal pain, nausea, vomiting, dysphagia in a patient with history of ulcerative reflux esophagitis on EGD one year ago.  No longer on PPI.  Will restart omeprazole 40 mg BID.  I have discussed with her possible repeat EGD, however, she became tearful when I spoke about the procedure and would like to hold off for now.  She will return in 2-3 weeks for reassessment and see if there is improvement on PPI as there was in the past.   -ETOH abuse/ETOH hepatitis:  Needs to discontinue ETOH use and spoke with patient about this extensively.  Total bili and INR ok right now, although transaminases increased recently.  Previous liver serology evaluation unremarkable.    Addendum: Reviewed and agree with management. Very concerning she continues to drink ETOH in the setting of liver inflammation and prior decompensated ETOH liver disease. We have stressed the importance of cessation and offered counseling which she has declined Close followup advised Jerene Bears, MD

## 2015-01-29 NOTE — Patient Instructions (Signed)
We have sent the following medications to your pharmacy for you to pick up at your convenience: Omeprazole  Please Follow back up with Jaime Phillips on July 25 at 10:30

## 2015-02-20 ENCOUNTER — Encounter: Payer: Self-pay | Admitting: Gastroenterology

## 2015-02-24 ENCOUNTER — Encounter: Payer: Self-pay | Admitting: Gastroenterology

## 2015-02-24 ENCOUNTER — Other Ambulatory Visit (INDEPENDENT_AMBULATORY_CARE_PROVIDER_SITE_OTHER): Payer: Medicaid Other

## 2015-02-24 ENCOUNTER — Ambulatory Visit (INDEPENDENT_AMBULATORY_CARE_PROVIDER_SITE_OTHER): Payer: Medicaid Other | Admitting: Gastroenterology

## 2015-02-24 VITALS — BP 106/58 | HR 110 | Ht 63.0 in | Wt 122.5 lb

## 2015-02-24 DIAGNOSIS — K7460 Unspecified cirrhosis of liver: Secondary | ICD-10-CM

## 2015-02-24 DIAGNOSIS — F1010 Alcohol abuse, uncomplicated: Secondary | ICD-10-CM

## 2015-02-24 DIAGNOSIS — R112 Nausea with vomiting, unspecified: Secondary | ICD-10-CM

## 2015-02-24 DIAGNOSIS — K921 Melena: Secondary | ICD-10-CM

## 2015-02-24 LAB — HEPATIC FUNCTION PANEL
ALT: 46 U/L — ABNORMAL HIGH (ref 0–35)
AST: 214 U/L — ABNORMAL HIGH (ref 0–37)
Albumin: 3.8 g/dL (ref 3.5–5.2)
Alkaline Phosphatase: 375 U/L — ABNORMAL HIGH (ref 39–117)
BILIRUBIN DIRECT: 2.8 mg/dL — AB (ref 0.0–0.3)
BILIRUBIN TOTAL: 4.4 mg/dL — AB (ref 0.2–1.2)
Total Protein: 6.6 g/dL (ref 6.0–8.3)

## 2015-02-24 LAB — PROTIME-INR
INR: 1.2 ratio — AB (ref 0.8–1.0)
Prothrombin Time: 13.1 s (ref 9.6–13.1)

## 2015-02-24 LAB — CBC WITH DIFFERENTIAL/PLATELET
BASOS ABS: 0 10*3/uL (ref 0.0–0.1)
BASOS PCT: 0.3 % (ref 0.0–3.0)
EOS ABS: 0 10*3/uL (ref 0.0–0.7)
Eosinophils Relative: 0.1 % (ref 0.0–5.0)
HCT: 29.2 % — ABNORMAL LOW (ref 36.0–46.0)
HEMOGLOBIN: 10.1 g/dL — AB (ref 12.0–15.0)
LYMPHS ABS: 1.5 10*3/uL (ref 0.7–4.0)
LYMPHS PCT: 25.8 % (ref 12.0–46.0)
MCHC: 34.5 g/dL (ref 30.0–36.0)
MCV: 102.9 fl — ABNORMAL HIGH (ref 78.0–100.0)
Monocytes Absolute: 0.5 10*3/uL (ref 0.1–1.0)
Monocytes Relative: 9 % (ref 3.0–12.0)
Neutro Abs: 3.7 10*3/uL (ref 1.4–7.7)
Neutrophils Relative %: 64.8 % (ref 43.0–77.0)
Platelets: 223 10*3/uL (ref 150.0–400.0)
RBC: 2.84 Mil/uL — ABNORMAL LOW (ref 3.87–5.11)
RDW: 15.2 % (ref 11.5–15.5)
WBC: 5.8 10*3/uL (ref 4.0–10.5)

## 2015-02-24 LAB — BASIC METABOLIC PANEL
BUN: 7 mg/dL (ref 6–23)
CHLORIDE: 92 meq/L — AB (ref 96–112)
CO2: 21 meq/L (ref 19–32)
CREATININE: 0.61 mg/dL (ref 0.40–1.20)
Calcium: 9.5 mg/dL (ref 8.4–10.5)
GFR: 131.1 mL/min (ref >60.00)
Glucose, Bld: 120 mg/dL — ABNORMAL HIGH (ref 70–99)
POTASSIUM: 3 meq/L — AB (ref 3.5–5.1)
Sodium: 133 mEq/L — ABNORMAL LOW (ref 135–145)

## 2015-02-24 NOTE — Progress Notes (Addendum)
02/24/2015 Jaime Phillips [id number] 04/10/1961   History of Present Illness:  This is a 54 year old female who is known to Dr. [name].  She has a history of diverticulosis, anxiety/depression, alcohol use/ETOH hepatitis, chronic pain, arthritis status post left total hip replacement. She came for colonoscopy on 08/21/2012 this revealed a poor right-sided preparation. Two-day bowel prep was recommended with a followup colonoscopy which she canceled. She was hospitalized in early July 2015 and evaluated by the inpatient GI consult team for hematemesis. She had upper endoscopy performed by Dr. Ardis Hughs on 02/06/2014. 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 She was treated with twice a day PPI and at follow-up in 03/2014 she was feeling well.  Repeat colonoscopy in 10/2014 showed moderate diverticulosis only.  She was in our office a few weeks ago with complaints of nausea, vomiting, unable to eat, sensation of food getting stuck, and upper abdominal pain.  We discussed repeat EGD, however, the patient decided to try PPI again first since she had improvement with that in the past.    She comes in today for follow-up.  She says that the omeprazole made her feel worse so she is no longer taking it.  She says that overall she is feeling a little better, but is still having vomiting.  Her weight is down 6 pounds since we saw her a few weeks ago.  Has black/darker colored stools on occasion but is not seeing blood with her vomiting.  She is still drinking ETOH (about 3 airplane sized bottles of liquor a day).  She says that she has intentions to quit but when asked if she would like help she said "I can quit by myself".  I told her and her friend/significant other that she will die if she continues to drink.     Current Medications, Allergies, Past Medical History, Past Surgical History, Family History and Social History were reviewed in  [employer] record.   Physical Exam: Ht 5\' 3"  (1.6 m)  Wt 122 lb 8 oz (55.566 kg)  BMI 21.71 kg/m2 General:  Chronically ill-appearing female in no acute distress; appears jaundiced Head: Normocephalic and atraumatic Eyes:  Scleral icterus noted  Ears: Normal auditory acuity Lungs: Clear throughout to auscultation Heart: Tachy.  No murmurs noted. Abdomen: Soft, non-distended.  BS present.  Hepatomegaly noted with tenderness over liver. Musculoskeletal: Symmetrical with no gross deformities  Extremities: No edema  Neurological: Alert oriented x 4, grossly non-focal; slow with responses.  Unsteady gait. Psychological:  Alert and cooperative. Normal mood and affect  Assessment and Recommendations: -Epigastric abdominal pain, nausea, vomiting, and intermittent dark stools in a patient with history of ulcerative reflux esophagitis on EGD one year ago and ETOH liver disease.  No longer on PPI (said the omeprazole made her feel worse although she had improvement on it in the past.  Will schedule her for EGD.  The risks, benefits, and alternatives for EGD were discussed with the patient and she consents to proceed.  Will check CBC today. -ETOH abuse/ETOH hepatitis:  Needs to discontinue ETOH use and spoke with patient about this extensively.  I offered to refer her to rehab/counseling and she declined despite understanding the gravity of the situation.  Said "I can quit by myself".  Previous liver serology evaluation unremarkable.  Will check BMP and hepatic function panel today.  *Follow-up appt to be scheduled with Dr. [name] for September as well.  Addendum: Reviewed and agree with ongoing management. Jerene Bears, MD

## 2015-02-24 NOTE — Patient Instructions (Signed)
You have been scheduled for an endoscopy. Please follow written instructions given to you at your visit today. If you use inhalers (even only as needed), please bring them with you on the day of your procedure.  Your physician has requested that you go to the basement for lab work before leaving today.

## 2015-02-27 ENCOUNTER — Other Ambulatory Visit: Payer: Self-pay

## 2015-02-27 DIAGNOSIS — R112 Nausea with vomiting, unspecified: Secondary | ICD-10-CM

## 2015-02-27 DIAGNOSIS — K7460 Unspecified cirrhosis of liver: Secondary | ICD-10-CM

## 2015-02-27 DIAGNOSIS — K921 Melena: Secondary | ICD-10-CM

## 2015-02-28 ENCOUNTER — Telehealth: Payer: Self-pay | Admitting: *Deleted

## 2015-02-28 DIAGNOSIS — E876 Hypokalemia: Secondary | ICD-10-CM

## 2015-02-28 NOTE — Telephone Encounter (Signed)
-----   Message from Loralie Champagne, PA-C sent at 02/28/2015 10:44 AM EDT ----- Please let patient know that potassium is low.  Please try to eat some foods high in potassium such as bananas, etc.  Liver tests are worse as suspected.  Needs to stop drinking ETOH as we discussed at her visit.  Please have her get a repeat BMP in one week.  Thank you,  Jess

## 2015-02-28 NOTE — Telephone Encounter (Signed)
Patient advised of low potassium as well as Jessica's recommendations to get foods high in potassium. Also advised of worsening liver tests and asked that patient refrain from alcohol. Patient has also been asked to have repeat labs 03/10/15. She verbalizes understanding. Orders in EPIC.

## 2015-03-10 ENCOUNTER — Other Ambulatory Visit (INDEPENDENT_AMBULATORY_CARE_PROVIDER_SITE_OTHER): Payer: Medicaid Other

## 2015-03-10 DIAGNOSIS — E876 Hypokalemia: Secondary | ICD-10-CM

## 2015-03-10 LAB — BASIC METABOLIC PANEL
BUN: 9 mg/dL (ref 6–23)
CALCIUM: 9.3 mg/dL (ref 8.4–10.5)
CO2: 23 mEq/L (ref 19–32)
Chloride: 106 mEq/L (ref 96–112)
Creatinine, Ser: 0.45 mg/dL (ref 0.40–1.20)
GFR: 186.21 mL/min (ref >60.00)
GLUCOSE: 93 mg/dL (ref 70–99)
Potassium: 3.9 mEq/L (ref 3.5–5.1)
SODIUM: 139 meq/L (ref 135–145)

## 2015-03-11 ENCOUNTER — Encounter (HOSPITAL_COMMUNITY): Payer: Self-pay | Admitting: Emergency Medicine

## 2015-03-11 ENCOUNTER — Emergency Department (HOSPITAL_COMMUNITY): Payer: Medicaid Other

## 2015-03-11 ENCOUNTER — Emergency Department (HOSPITAL_COMMUNITY)
Admission: EM | Admit: 2015-03-11 | Discharge: 2015-03-11 | Disposition: A | Discharge status: Nursing Home (Includes ICF) | Payer: Medicaid Other | Source: Home / Self Care

## 2015-03-11 ENCOUNTER — Emergency Department (HOSPITAL_COMMUNITY)
Admission: EM | Admit: 2015-03-11 | Discharge: 2015-03-11 | Disposition: A | Discharge status: Home / Self Care | Payer: Medicaid Other | Attending: Emergency Medicine | Admitting: Emergency Medicine

## 2015-03-11 ENCOUNTER — Encounter (HOSPITAL_COMMUNITY): Payer: Self-pay | Admitting: *Deleted

## 2015-03-11 DIAGNOSIS — G894 Chronic pain syndrome: Secondary | ICD-10-CM | POA: Insufficient documentation

## 2015-03-11 DIAGNOSIS — R0789 Other chest pain: Secondary | ICD-10-CM | POA: Insufficient documentation

## 2015-03-11 DIAGNOSIS — R609 Edema, unspecified: Secondary | ICD-10-CM

## 2015-03-11 DIAGNOSIS — R0602 Shortness of breath: Secondary | ICD-10-CM

## 2015-03-11 DIAGNOSIS — Z8742 Personal history of other diseases of the female genital tract: Secondary | ICD-10-CM | POA: Insufficient documentation

## 2015-03-11 DIAGNOSIS — Z79899 Other long term (current) drug therapy: Secondary | ICD-10-CM | POA: Diagnosis not present

## 2015-03-11 DIAGNOSIS — R079 Chest pain, unspecified: Secondary | ICD-10-CM | POA: Diagnosis not present

## 2015-03-11 DIAGNOSIS — M1990 Unspecified osteoarthritis, unspecified site: Secondary | ICD-10-CM | POA: Insufficient documentation

## 2015-03-11 DIAGNOSIS — Z720 Tobacco use: Secondary | ICD-10-CM | POA: Insufficient documentation

## 2015-03-11 DIAGNOSIS — Z8719 Personal history of other diseases of the digestive system: Secondary | ICD-10-CM | POA: Diagnosis not present

## 2015-03-11 DIAGNOSIS — Z7982 Long term (current) use of aspirin: Secondary | ICD-10-CM | POA: Insufficient documentation

## 2015-03-11 DIAGNOSIS — Z8639 Personal history of other endocrine, nutritional and metabolic disease: Secondary | ICD-10-CM | POA: Diagnosis not present

## 2015-03-11 DIAGNOSIS — R600 Localized edema: Secondary | ICD-10-CM | POA: Diagnosis not present

## 2015-03-11 DIAGNOSIS — Z8659 Personal history of other mental and behavioral disorders: Secondary | ICD-10-CM | POA: Diagnosis not present

## 2015-03-11 DIAGNOSIS — Z862 Personal history of diseases of the blood and blood-forming organs and certain disorders involving the immune mechanism: Secondary | ICD-10-CM | POA: Diagnosis not present

## 2015-03-11 DIAGNOSIS — R2243 Localized swelling, mass and lump, lower limb, bilateral: Secondary | ICD-10-CM | POA: Diagnosis present

## 2015-03-11 DIAGNOSIS — Z86010 Personal history of colonic polyps: Secondary | ICD-10-CM | POA: Diagnosis not present

## 2015-03-11 LAB — COMPREHENSIVE METABOLIC PANEL
ALT: 45 U/L (ref 14–54)
AST: 80 U/L — ABNORMAL HIGH (ref 15–41)
Albumin: 3.1 g/dL — ABNORMAL LOW (ref 3.5–5.0)
Alkaline Phosphatase: 158 U/L — ABNORMAL HIGH (ref 38–126)
Anion gap: 8 (ref 5–15)
BUN: 8 mg/dL (ref 6–20)
CO2: 22 mmol/L (ref 22–32)
Calcium: 9 mg/dL (ref 8.9–10.3)
Chloride: 110 mmol/L (ref 101–111)
Creatinine, Ser: 0.64 mg/dL (ref 0.44–1.00)
GFR calc Af Amer: >60 mL/min (ref >60)
Glucose, Bld: 88 mg/dL (ref 65–99)
Potassium: 4 mmol/L (ref 3.5–5.1)
Sodium: 140 mmol/L (ref 135–145)
TOTAL PROTEIN: 6.1 g/dL — AB (ref 6.5–8.1)
Total Bilirubin: 0.8 mg/dL (ref 0.3–1.2)

## 2015-03-11 LAB — URINALYSIS, ROUTINE W REFLEX MICROSCOPIC
BILIRUBIN URINE: NEGATIVE
Glucose, UA: NEGATIVE mg/dL
HGB URINE DIPSTICK: NEGATIVE
Ketones, ur: NEGATIVE mg/dL
Leukocytes, UA: NEGATIVE
Nitrite: NEGATIVE
PROTEIN: NEGATIVE mg/dL
Specific Gravity, Urine: 1.021 (ref 1.005–1.030)
Urobilinogen, UA: 0.2 mg/dL (ref 0.0–1.0)
pH: 6 (ref 5.0–8.0)

## 2015-03-11 LAB — CBC WITH DIFFERENTIAL/PLATELET
Basophils Absolute: 0 10*3/uL (ref 0.0–0.1)
Basophils Relative: 1 % (ref 0–1)
Eosinophils Absolute: 0 10*3/uL (ref 0.0–0.7)
Eosinophils Relative: 1 % (ref 0–5)
HEMATOCRIT: 31.6 % — AB (ref 36.0–46.0)
HEMOGLOBIN: 10.1 g/dL — AB (ref 12.0–15.0)
Lymphocytes Relative: 42 % (ref 12–46)
Lymphs Abs: 2.8 10*3/uL (ref 0.7–4.0)
MCH: 33.9 pg (ref 26.0–34.0)
MCHC: 32 g/dL (ref 30.0–36.0)
MCV: 106 fL — ABNORMAL HIGH (ref 78.0–100.0)
Monocytes Absolute: 0.3 10*3/uL (ref 0.1–1.0)
Monocytes Relative: 4 % (ref 3–12)
NEUTROS ABS: 3.6 10*3/uL (ref 1.7–7.7)
Neutrophils Relative %: 52 % (ref 43–77)
Platelets: 278 10*3/uL (ref 150–400)
RBC: 2.98 MIL/uL — AB (ref 3.87–5.11)
RDW: 16.3 % — ABNORMAL HIGH (ref 11.5–15.5)
WBC: 6.8 10*3/uL (ref 4.0–10.5)

## 2015-03-11 LAB — TROPONIN I: Troponin I: <0.03 ng/mL (ref <0.031)

## 2015-03-11 LAB — BRAIN NATRIURETIC PEPTIDE: B Natriuretic Peptide: 281.2 pg/mL — ABNORMAL HIGH (ref 0.0–100.0)

## 2015-03-11 MED ORDER — FUROSEMIDE 20 MG PO TABS: 20.0000 mg | ORAL_TABLET | Freq: Every day | ORAL | Status: DC

## 2015-03-11 NOTE — ED Notes (Signed)
Pt arrives from urgent care via [facility] c/o bilat leg and abd swelling. Pt states that the swelling has been happening intermittently for a while but has been constant for 6 days. Also c/o acid reflux. EKG from UC shown to Dr Wyvonnia Dusky. No need to repeat. Denies CP.

## 2015-03-11 NOTE — ED Notes (Signed)
Ambulated pt, pulse started at 57 at rest. While walking pt's pulse picked up to 87.

## 2015-03-11 NOTE — ED Notes (Signed)
Pt has 2+ edema in both legs and feet all the way up to her knees.  Pt states they are not as swollen as they were a few days ago.  She states the swelling started about a week ago.  Pt recently quit drinking about two months ago and was told "there was something wrong with her liver."  Pt is also scheduled for an EGD with dilation on August 29 for issues she has been having with swallowing, N&V.

## 2015-03-11 NOTE — ED Provider Notes (Signed)
CSN: [id number]     Arrival date & time 03/11/15  1956 History   First MD Initiated Contact with Patient 03/11/15 2034     Chief Complaint  Patient presents with  . Leg Swelling     (Consider location/radiation/quality/duration/timing/severity/associated sxs/prior Treatment) HPI Comments: Patient from urgent care with a six-day history of bilateral leg swelling that is constant and persistent. Patient denies any history of similar problem. She is a alcohol abuser and states she has not had a drink in about a month. She denies some shortness of breath. Her chest feels "tight" similar to her previous heartburn. She denies any history of heart failure. She denies any kidney problems. She does not take any diaphoretic.   The history is provided by the patient.    Past Medical History  Diagnosis Date  . Abnormal uterine bleeding   . Depression   . Arthritis   . Rectal bleeding     minor   . Headache(784.0)   . Macrocytic anemia   . PUD (peptic ulcer disease)   . Diverticulosis   . Internal hemorrhoids   . Mallory-Weiss tear   . Alcoholism   . Chronic pain syndrome   . Hyperplastic colon polyp   . LFT elevation   . Hypopotassemia    Past Surgical History  Procedure Laterality Date  . Appendectomy    . Oophorectomy    . Shoulder surgery Left   . Colonoscopy    . 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 Orif periprosthetic fracture Right 05/28/2013    Procedure: OPEN REDUCTION INTERNAL FIXATION (ORIF) PERIPROSTHETIC FRACTURE;  Surgeon: Mauri Pole, MD;  Location: [facility];  Service: Orthopedics;  Laterality: Right;  . Esophagogastroduodenoscopy N/A 02/06/2014    Procedure: ESOPHAGOGASTRODUODENOSCOPY (EGD);  Surgeon: Milus Banister, MD;  Location: [place];  Service: Endoscopy;  Laterality: N/A;  . Total hip arthroplasty      left hip   Family History  Problem Relation Age of Onset  . Diabetes Mother   . Kidney disease Mother   . Heart disease Father   . Colon cancer Father     questionable  . Breast cancer Sister    Social History  Substance Use Topics  . Smoking status: Current Every Day Smoker -- 0.50 packs/day for 25 years    Types: Cigarettes  . Smokeless tobacco: Never Used     Comment: Tobacco info given to pt. 08/16/12  . Alcohol Use: No     Comment: pt quit drinking 2 months ago (today is 03/11/15)   OB History    Gravida Para Term Preterm AB TAB SAB Ectopic Multiple Living   2 1 1  1  1   1      Review of Systems  Constitutional: Negative for fever, activity change and appetite change.  HENT: Negative for congestion and rhinorrhea.   Respiratory: Positive for chest tightness and shortness of breath. Negative for cough.   Cardiovascular: Positive for leg swelling. Negative for chest pain.  Gastrointestinal: Negative for nausea, vomiting and abdominal pain.  Genitourinary: Negative for dysuria, hematuria, vaginal bleeding and vaginal discharge.  Musculoskeletal: Negative for myalgias and arthralgias.  Skin: Negative for rash.  Neurological: Negative for dizziness, weakness, light-headedness and headaches.  A complete 10 system review of systems was obtained and all systems are negative except as noted in the  HPI and PMH.      Allergies  Codeine and Morphine and related  Home Medications   Prior to Admission medications   Medication Sig Start Date End Date Taking? Authorizing Provider  aspirin EC 81 MG tablet Take 81 mg by mouth daily.    Historical Provider, MD  Coenzyme Q10 (CO Q 10 PO) Take 5 mg by mouth daily.     Historical Provider, MD  furosemide (LASIX) 20 MG tablet Take 1 tablet (20 mg total) by mouth daily. 03/11/15   Ezequiel Essex, MD  polyethylene glycol (MIRALAX / GLYCOLAX) packet Take 17 g by mouth daily. 12/19/14   Nishant Dhungel, MD   BP 136/85 mmHg  Pulse 60   Temp(Src) 98.2 F (36.8 C) (Oral)  Resp 18  Ht 5\' 3"  (1.6 m)  Wt 122 lb (55.339 kg)  BMI 21.62 kg/m2  SpO2 100% Physical Exam  Constitutional: She is oriented to person, place, and time. She appears well-developed and well-nourished. No distress.  HENT:  Head: Normocephalic and atraumatic.  Mouth/Throat: Oropharynx is clear and moist. No oropharyngeal exudate.  Eyes: Conjunctivae and EOM are normal. Pupils are equal, round, and reactive to light.  Neck: Normal range of motion. Neck supple.  No meningismus.  Cardiovascular: Normal rate, regular rhythm, normal heart sounds and intact distal pulses.   No murmur heard. Pulmonary/Chest: Effort normal and breath sounds normal. No respiratory distress. She exhibits no tenderness.  Abdominal: Soft. There is no tenderness. There is no rebound and no guarding.  Musculoskeletal: Normal range of motion. She exhibits tenderness. She exhibits no edema.  +2 pitting edema to knees bilaterally.  Intact DP and PT pulses.  No calf tenderness  Neurological: She is alert and oriented to person, place, and time. No cranial nerve deficit. She exhibits normal muscle tone. Coordination normal.  No ataxia on finger to nose bilaterally. No pronator drift. 5/5 strength throughout. CN 2-12 intact. Negative Romberg. Equal grip strength. Sensation intact. Gait is normal.   Skin: Skin is warm.  Psychiatric: She has a normal mood and affect. Her behavior is normal.  Nursing note and vitals reviewed.   ED Course  Procedures (including critical care time) Labs Review Labs Reviewed  CBC WITH DIFFERENTIAL/PLATELET - Abnormal; Notable for the following:    RBC 2.98 (*)    Hemoglobin 10.1 (*)    HCT 31.6 (*)    MCV 106.0 (*)    RDW 16.3 (*)    All other components within normal limits  COMPREHENSIVE METABOLIC PANEL - Abnormal; Notable for the following:    Total Protein 6.1 (*)    Albumin 3.1 (*)    AST 80 (*)    Alkaline Phosphatase 158 (*)    All other  components within normal limits  BRAIN NATRIURETIC PEPTIDE - Abnormal; Notable for the following:    B Natriuretic Peptide 281.2 (*)    All other components within normal limits  TROPONIN I  URINALYSIS, ROUTINE W REFLEX MICROSCOPIC (NOT AT [facility])    Imaging Review Dg Chest 2 View  03/11/2015   CLINICAL DATA:  Chest pain, leg swelling  EXAM: CHEST  2 VIEW  COMPARISON:  12/17/2014  FINDINGS: Cardiomediastinal silhouette is stable. No acute infiltrate or pleural effusion. No pulmonary edema. Bony thorax is unremarkable.  IMPRESSION: No active cardiopulmonary disease.   Electronically Signed   By: Lahoma Crocker M.D.   On: 03/11/2015 22:16     EKG Interpretation   Date/Time:  Tuesday March 11 2015 21:47:19 EDT Ventricular  Rate:  61 PR Interval:  136 QRS Duration: 83 QT Interval:  439 QTC Calculation: 442 R Axis:   87 Text Interpretation:  Sinus rhythm No significant change was found  Confirmed by Wyvonnia Dusky  MD, Buford Gayler ([id number]) on 03/11/2015 10:02:54 PM      MDM   Final diagnoses:  Peripheral edema   1 week of peripheral edema. No chest pain or significant shortness of breath. No hypoxia. Lungs are clear. Hx alcohol abuse and probable cirrhosis.  EKG nsr. Troponin negative. LFTs improved. Likely chronically elevated due to alcohol abuse. No evidence of heart failure. Creatinine is normal.  Suspect peripheral third spacing due to cirrhosis. Doubt bilateral DVTs.  We'll start on low-dose Lasix. Need to follow up with PCP for echocardiogram and further evaluation of peripheral edema. SHe is ambulatory without desaturation.  Stable for discharge.    Ezequiel Essex, MD 03/12/15 [phone]

## 2015-03-11 NOTE — ED Notes (Signed)
Pt being transferred to the ED via [place] for further evaluation for CP, SOB, N&V, and 2+ pitting edema in her legs and feet.  Report was called to [facility] and the ED Charge RN.

## 2015-03-11 NOTE — Discharge Instructions (Signed)
Peripheral Edema Follow-up with your doctor for an echocardiogram of your heart. Take the diuretic medicine as prescribed. You need to have a recheck of your kidney and liver function next week. Return to the ED if you develop chest pain, shortness of breath or any other concerns. You have swelling in your legs (peripheral edema). This swelling is due to excess accumulation of salt and water in your body. Edema may be a sign of heart, kidney or liver disease, or a side effect of a medication. It may also be due to problems in the leg veins. Elevating your legs and using special support stockings may be very helpful, if the cause of the swelling is due to poor venous circulation. Avoid long periods of standing, whatever the cause. Treatment of edema depends on identifying the cause. Chips, pretzels, pickles and other salty foods should be avoided. Restricting salt in your diet is almost always needed. Water pills (diuretics) are often used to remove the excess salt and water from your body via urine. These medicines prevent the kidney from reabsorbing sodium. This increases urine flow. Diuretic treatment may also result in lowering of potassium levels in your body. Potassium supplements may be needed if you have to use diuretics daily. Daily weights can help you keep track of your progress in clearing your edema. You should call your caregiver for follow up care as recommended. SEEK IMMEDIATE MEDICAL CARE IF:   You have increased swelling, pain, redness, or heat in your legs.  You develop shortness of breath, especially when lying down.  You develop chest or abdominal pain, weakness, or fainting.  You have a fever. Document Released: 08/26/2004 Document Revised: 10/11/2011 Document Reviewed: 08/06/2009 [facility] Patient Information 2015 [place], [place]. This information is not intended to replace advice given to you by your health care provider. Make sure you discuss any questions you have with your  health care provider.

## 2015-03-11 NOTE — ED Notes (Signed)
Pt. Stable, ambulatory, and stated understanding of discharge instructions.

## 2015-03-11 NOTE — ED Provider Notes (Signed)
CSN: [id number]     Arrival date & time 03/11/15  1520 History   None    Chief Complaint  Patient presents with  . Leg Swelling   (Consider location/radiation/quality/duration/timing/severity/associated sxs/prior Treatment)  HPI   She is a 54 year old female presenting today with complaints of bilateral "leg swelling", shortness of breath and chest tightness.  The patient states she has had severe N/V for the past several days which she attributed to GI issues.  She is schedule for scope in a few days.  Hx significant for PUD and chronic alcoholism.   Pedal edema started approximately 1 week ago.   Past Medical History  Diagnosis Date  . Abnormal uterine bleeding   . Depression   . Arthritis   . Rectal bleeding     minor   . Headache(784.0)   . Macrocytic anemia   . PUD (peptic ulcer disease)   . Diverticulosis   . Internal hemorrhoids   . Mallory-Weiss tear   . Alcoholism   . Chronic pain syndrome   . Hyperplastic colon polyp   . LFT elevation   . Hypopotassemia    Past Surgical History  Procedure Laterality Date  . Appendectomy    . Oophorectomy    . Shoulder surgery Left   . Colonoscopy    . 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 Orif periprosthetic fracture Right 05/28/2013    Procedure: OPEN REDUCTION INTERNAL FIXATION (ORIF) PERIPROSTHETIC FRACTURE;  Surgeon: Mauri Pole, MD;  Location: [facility];  Service: Orthopedics;  Laterality: Right;  . Esophagogastroduodenoscopy N/A 02/06/2014    Procedure: ESOPHAGOGASTRODUODENOSCOPY (EGD);  Surgeon: Milus Banister, MD;  Location: [facility];  Service: Endoscopy;  Laterality: N/A;  . Total hip arthroplasty      left hip   Family History  Problem Relation Age of Onset  . Diabetes Mother   . Kidney disease Mother   . Heart disease Father   . Colon cancer Father    questionable  . Breast cancer Sister    History  Substance Use Topics  . Smoking status: Current Every Day Smoker -- 0.50 packs/day for 25 years    Types: Cigarettes  . Smokeless tobacco: Never Used     Comment: Tobacco info given to pt. 08/16/12  . Alcohol Use: No     Comment: pt quit drinking 2 months ago (today is 03/11/15)   OB History    Gravida Para Term Preterm AB TAB SAB Ectopic Multiple Living   2 1 1  1  1   1      Review of Systems  Constitutional: Negative.  Negative for fever and chills.  HENT: Negative.  Negative for sinus pressure, sneezing and sore throat.   Eyes: Negative.   Respiratory: Positive for chest tightness and shortness of breath. Negative for cough and wheezing.   Cardiovascular: Positive for chest pain and leg swelling.  Gastrointestinal: Positive for nausea and vomiting.  Endocrine: Negative.   Genitourinary: Negative.   Musculoskeletal: Negative.   Skin: Negative.  Negative for pallor and rash.  Allergic/Immunologic: Negative for environmental allergies, food allergies and immunocompromised state.  Neurological: Negative.   Hematological: Negative.   Psychiatric/Behavioral: Negative.     Allergies  Codeine and Morphine and related  Home Medications   Prior to Admission medications   Medication Sig Start Date End Date Taking? Authorizing  Provider  Coenzyme Q10 (CO Q 10 PO) Take 5 mg by mouth daily.    Yes Historical Provider, MD  aspirin EC 81 MG tablet Take 81 mg by mouth daily.    Historical Provider, MD  polyethylene glycol (MIRALAX / GLYCOLAX) packet Take 17 g by mouth daily. 12/19/14   Nishant Dhungel, MD   BP 152/89 mmHg  Pulse 76  Temp(Src) 98.2 F (36.8 C) (Oral)  Resp 16  SpO2 100%   Physical Exam  Constitutional: She is oriented to person, place, and time. She appears well-developed and well-nourished. No distress.  Eyes: Conjunctivae are normal. Pupils are equal, round, and reactive to light. Right eye exhibits no discharge.  Left eye exhibits no discharge. No scleral icterus.  Neck: Normal range of motion. Neck supple. No thyromegaly present.  Cardiovascular: Normal rate, normal heart sounds and intact distal pulses.  Exam reveals no gallop and no friction rub.   No murmur heard. Regularly irregular.   Pulmonary/Chest: Effort normal and breath sounds normal. No respiratory distress. She has no wheezes. She has no rales. She exhibits no tenderness.  Adventitious breath sounds noted in right lower lobe, clear to cough.  Abdominal: Soft. Bowel sounds are normal. She exhibits distension. She exhibits no mass. There is tenderness. There is no rebound and no guarding.  Musculoskeletal: She exhibits edema.       Legs: 1-2+ pitting edema bilaterally.    Neurological: She is alert and oriented to person, place, and time.  Skin: Skin is warm and dry. She is not diaphoretic. No pallor.  Nursing note and vitals reviewed.   ED Course  Procedures (including critical care time) Labs Review Labs Reviewed - No data to display  Imaging Review No results found.  EKG ordered.  Hep lock started and patient placed on 2L 02 via nasal cannula.    EKG Interpretation  Date/Time:    Ventricular Rate:    PR Interval:    QRS Duration:   QT Interval:    QTC Calculation:   R Axis:     Text Interpretation:     Sinus rhythm with marked sinus arrhythmia.  T waves present.  Vent rate 61 bpm.  PR interval 61bpm, QRS duration 72 ms, QT/QTc 402/404 ms; P-R-T axes 27 69 74.    MDM   1. Chest pain, unspecified chest pain type   2. Shortness of breath   3. Edema    Plan of care was discussed with Dr. Maryruth Eve and patient.  The patient to be transferred to [facility] ED for further workup and evaluation via ambulance.  The patient verbalizes understanding and agrees to plan of care.       Nehemiah Settle, NP 03/11/15 1906

## 2015-03-17 ENCOUNTER — Encounter: Payer: Self-pay | Admitting: Family Medicine

## 2015-03-17 ENCOUNTER — Ambulatory Visit: Payer: Medicaid Other | Attending: Family Medicine | Admitting: Family Medicine

## 2015-03-17 VITALS — BP 133/86 | HR 88 | Temp 98.0°F | Resp 16 | Wt 133.6 lb

## 2015-03-17 DIAGNOSIS — K7460 Unspecified cirrhosis of liver: Secondary | ICD-10-CM

## 2015-03-17 DIAGNOSIS — R609 Edema, unspecified: Secondary | ICD-10-CM

## 2015-03-17 DIAGNOSIS — F17210 Nicotine dependence, cigarettes, uncomplicated: Secondary | ICD-10-CM | POA: Diagnosis not present

## 2015-03-17 DIAGNOSIS — F1011 Alcohol abuse, in remission: Secondary | ICD-10-CM

## 2015-03-17 DIAGNOSIS — F1010 Alcohol abuse, uncomplicated: Secondary | ICD-10-CM

## 2015-03-17 DIAGNOSIS — M25473 Effusion, unspecified ankle: Secondary | ICD-10-CM | POA: Insufficient documentation

## 2015-03-17 LAB — COMPLETE METABOLIC PANEL WITH GFR
ALT: 27 U/L (ref 6–29)
AST: 39 U/L — ABNORMAL HIGH (ref 10–35)
Albumin: 3.7 g/dL (ref 3.6–5.1)
Alkaline Phosphatase: 124 U/L (ref 33–130)
BUN: 6 mg/dL — ABNORMAL LOW (ref 7–25)
CALCIUM: 9.3 mg/dL (ref 8.6–10.4)
CHLORIDE: 106 mmol/L (ref 98–110)
CO2: 21 mmol/L (ref 20–31)
Creat: 0.46 mg/dL — ABNORMAL LOW (ref 0.50–1.05)
Glucose, Bld: 90 mg/dL (ref 65–99)
POTASSIUM: 4.6 mmol/L (ref 3.5–5.3)
Sodium: 140 mmol/L (ref 135–146)
Total Bilirubin: 0.7 mg/dL (ref 0.2–1.2)
Total Protein: 6.6 g/dL (ref 6.1–8.1)

## 2015-03-17 MED ORDER — SPIRONOLACTONE 25 MG PO TABS: 25.0000 mg | ORAL_TABLET | Freq: Two times a day (BID) | ORAL | Status: DC

## 2015-03-17 MED ORDER — FUROSEMIDE 20 MG PO TABS: 20.0000 mg | ORAL_TABLET | Freq: Two times a day (BID) | ORAL | Status: DC

## 2015-03-17 NOTE — Assessment & Plan Note (Signed)
A: per patient no ETOH x 2 months. BAL elevate one month ago on 02/05/2014.  P: Advised ongoing ETOH cessation

## 2015-03-17 NOTE — Progress Notes (Signed)
   Subjective:    Patient ID: Jaime Phillips, female    DOB: 22-Sep-1960, 54 y.o.   MRN: [id number] CC: ED f/u ankle swelling  HPI 54 yo F with hx of ETOH abuse and hepatitis  1. Ankle swelling: x 3 weeks. Patient has hx of ETOH abuse and hepatitis. She reports no  ETOH x 2 months. Has ankle swelling that improved following lasix. She was seen in ED on 03/11/15 and given a 5 day course of lasix.  In ED proBNP was elevated. She has hx of hypokalemia with K+ down to 3.0.   Social History  Substance Use Topics  . Smoking status: Current Every Day Smoker -- 0.50 packs/day for 25 years    Types: Cigarettes  . Smokeless tobacco: Never Used     Comment: Tobacco info given to pt. 08/16/12  . Alcohol Use: No     Comment: pt quit drinking 2 months ago (today is 03/11/15)   Review of Systems  Constitutional: Negative for fever and chills.  HENT: Positive for trouble swallowing.   Eyes: Negative for visual disturbance.  Respiratory: Negative for shortness of breath.   Cardiovascular: Positive for leg swelling. Negative for chest pain.       Edema in ankles b/l   Gastrointestinal: Negative for nausea, vomiting, abdominal pain, diarrhea, constipation, blood in stool, abdominal distention, anal bleeding and rectal pain.  Musculoskeletal: Negative for back pain and arthralgias.  Skin: Negative for rash.       Generalized pruritus   Allergic/Immunologic: Negative for immunocompromised state.  Hematological: Negative for adenopathy. Does not bruise/bleed easily.  Psychiatric/Behavioral: Negative for suicidal ideas and dysphoric mood.      Objective:   Physical Exam  Constitutional: She is oriented to person, place, and time. She appears well-developed and well-nourished. No distress.  Eyes: Conjunctivae are normal. Pupils are equal, round, and reactive to light.  Cardiovascular: Normal rate, regular rhythm, normal heart sounds and intact distal pulses.   Pulmonary/Chest: Effort normal and breath  sounds normal.  Abdominal: Guarding: 1 + edema at ankles b/l   Musculoskeletal: She exhibits edema.  Neurological: She is alert and oriented to person, place, and time.  Skin: Skin is warm and dry. No rash noted.  Psychiatric: She has a normal mood and affect.  BP 133/86 mmHg  Pulse 88  Temp(Src) 98 F (36.7 C)  Resp 16  Wt 133 lb 9.6 oz (60.601 kg)  SpO2 100%  Wt Readings from Last 3 Encounters:  03/17/15 133 lb 9.6 oz (60.601 kg)  03/11/15 122 lb (55.339 kg)  02/24/15 122 lb 8 oz (55.566 kg)   BP Readings from Last 3 Encounters:  03/17/15 133/86  03/11/15 136/85  03/11/15 152/89     Chemistry      Component Value Date/Time   NA 140 03/11/2015 2110   K 4.0 03/11/2015 2110   CL 110 03/11/2015 2110   CO2 22 03/11/2015 2110   BUN 8 03/11/2015 2110   CREATININE 0.64 03/11/2015 2110   CREATININE 0.47* 12/24/2014 1710      Component Value Date/Time   CALCIUM 9.0 03/11/2015 2110   ALKPHOS 158* 03/11/2015 2110   AST 80* 03/11/2015 2110   ALT 45 03/11/2015 2110   BILITOT 0.8 03/11/2015 2110             Assessment & Plan:

## 2015-03-17 NOTE — Progress Notes (Signed)
Patient here for follow up Patient was seen in the ED for bilateral foot swelling Patient states her feet have been swollen for the past three weeks Patient also complains of feeling itchy all over

## 2015-03-17 NOTE — Patient Instructions (Addendum)
Jaime Phillips,  Thank you for coming in today. It was a pleasure meeting you. I look forward to being your primary doctor.  1. Ankle swelling in setting of history of alcohol abuse and hepatitis  Continue not to drink alcohol Taking lasix and spirolactone to keep fluid level down Checking CMP today ECHO or heart will be schedule to check on heart pumping and rule out congestive heart failure  F/u in 2 weeks for BMP, lab visit   F/u with me in 6 weeks for edema  Dr. Adrian Blackwater

## 2015-03-17 NOTE — Assessment & Plan Note (Signed)
A: suspected etiology hepatic impairment, rule out CHF/cardiomyopathy P: ECHO ordered

## 2015-03-25 ENCOUNTER — Other Ambulatory Visit: Payer: Self-pay

## 2015-03-25 ENCOUNTER — Ambulatory Visit (HOSPITAL_COMMUNITY): Payer: Medicaid Other | Attending: Cardiovascular Disease

## 2015-03-25 DIAGNOSIS — F1011 Alcohol abuse, in remission: Secondary | ICD-10-CM

## 2015-03-25 DIAGNOSIS — F17200 Nicotine dependence, unspecified, uncomplicated: Secondary | ICD-10-CM | POA: Diagnosis not present

## 2015-03-25 DIAGNOSIS — M25473 Effusion, unspecified ankle: Secondary | ICD-10-CM

## 2015-03-25 DIAGNOSIS — Z8249 Family history of ischemic heart disease and other diseases of the circulatory system: Secondary | ICD-10-CM | POA: Diagnosis not present

## 2015-03-25 DIAGNOSIS — R609 Edema, unspecified: Secondary | ICD-10-CM | POA: Diagnosis not present

## 2015-03-25 DIAGNOSIS — I340 Nonrheumatic mitral (valve) insufficiency: Secondary | ICD-10-CM | POA: Diagnosis not present

## 2015-03-25 DIAGNOSIS — K7460 Unspecified cirrhosis of liver: Secondary | ICD-10-CM

## 2015-03-28 ENCOUNTER — Encounter: Payer: Self-pay | Admitting: *Deleted

## 2015-03-31 ENCOUNTER — Encounter: Payer: Self-pay | Admitting: Internal Medicine

## 2015-03-31 ENCOUNTER — Ambulatory Visit (AMBULATORY_SURGERY_CENTER): Payer: Medicaid Other | Admitting: Internal Medicine

## 2015-03-31 VITALS — BP 160/101 | HR 105 | Temp 98.5°F | Resp 31 | Ht 63.0 in | Wt 122.0 lb

## 2015-03-31 DIAGNOSIS — R1013 Epigastric pain: Secondary | ICD-10-CM | POA: Diagnosis not present

## 2015-03-31 DIAGNOSIS — R112 Nausea with vomiting, unspecified: Secondary | ICD-10-CM | POA: Diagnosis not present

## 2015-03-31 DIAGNOSIS — K209 Esophagitis, unspecified without bleeding: Secondary | ICD-10-CM

## 2015-03-31 DIAGNOSIS — K921 Melena: Secondary | ICD-10-CM | POA: Diagnosis not present

## 2015-03-31 DIAGNOSIS — K2990 Gastroduodenitis, unspecified, without bleeding: Secondary | ICD-10-CM

## 2015-03-31 DIAGNOSIS — K2970 Gastritis, unspecified, without bleeding: Secondary | ICD-10-CM

## 2015-03-31 MED ORDER — PANTOPRAZOLE SODIUM 40 MG PO TBEC: 40.0000 mg | DELAYED_RELEASE_TABLET | Freq: Every day | ORAL | Status: DC

## 2015-03-31 MED ORDER — SODIUM CHLORIDE 0.9 % IV SOLN: 500.0000 mL | INTRAVENOUS | Status: DC

## 2015-03-31 MED ORDER — ONDANSETRON HCL 4 MG PO TABS
8.0000 mg | ORAL_TABLET | Freq: Once | ORAL | Status: AC
  Administered 2015-03-31: 8 mg via ORAL

## 2015-03-31 NOTE — Progress Notes (Signed)
Report to PACU, RN, vss, BBS= Clear.  

## 2015-03-31 NOTE — Patient Instructions (Signed)
Discharge instructions given. Biopsies taken. Handouts on gastritis,esophagitis  And a hiatal hernia. Resume previous medications. YOU HAD AN ENDOSCOPIC PROCEDURE TODAY AT [facility]:   Refer to the procedure report that was given to you for any specific questions about what was found during the examination.  If the procedure report does not answer your questions, please call your gastroenterologist to clarify.  If you requested that your care partner not be given the details of your procedure findings, then the procedure report has been included in a sealed envelope for you to review at your convenience later.  YOU SHOULD EXPECT: Some feelings of bloating in the abdomen. Passage of more gas than usual.  Walking can help get rid of the air that was put into your GI tract during the procedure and reduce the bloating. If you had a lower endoscopy (such as a colonoscopy or flexible sigmoidoscopy) you may notice spotting of blood in your stool or on the toilet paper. If you underwent a bowel prep for your procedure, you may not have a normal bowel movement for a few days.  Please Note:  You might notice some irritation and congestion in your nose or some drainage.  This is from the oxygen used during your procedure.  There is no need for concern and it should clear up in a day or so.  SYMPTOMS TO REPORT IMMEDIATELY:    Following upper endoscopy (EGD)  Vomiting of blood or coffee ground material  New chest pain or pain under the shoulder blades  Painful or persistently difficult swallowing  New shortness of breath  Fever of 100F or higher  Black, tarry-looking stools  For urgent or emergent issues, a gastroenterologist can be reached at any hour by calling [phone].   DIET: Your first meal following the procedure should be a small meal and then it is ok to progress to your normal diet. Heavy or fried foods are harder to digest and may make you feel nauseous or bloated.   Likewise, meals heavy in dairy and vegetables can increase bloating.  Drink plenty of fluids but you should avoid alcoholic beverages for 24 hours.  ACTIVITY:  You should plan to take it easy for the rest of today and you should NOT DRIVE or use heavy machinery until tomorrow (because of the sedation medicines used during the test).    FOLLOW UP: Our staff will call the number listed on your records the next business day following your procedure to check on you and address any questions or concerns that you may have regarding the information given to you following your procedure. If we do not reach you, we will leave a message.  However, if you are feeling well and you are not experiencing any problems, there is no need to return our call.  We will assume that you have returned to your regular daily activities without incident.  If any biopsies were taken you will be contacted by phone or by letter within the next 1-3 weeks.  Please call us at [phone] if you have not heard about the biopsies in 3 weeks.    SIGNATURES/CONFIDENTIALITY: You and/or your care partner have signed paperwork which will be entered into your electronic medical record.  These signatures attest to the fact that that the information above on your After Visit Summary has been reviewed and is understood.  Full responsibility of the confidentiality of this discharge information lies with you and/or your care-partner.

## 2015-03-31 NOTE — Progress Notes (Signed)
Questioned pt as to whether or not on potassium the patient pt said no then said she did not know.

## 2015-03-31 NOTE — Progress Notes (Signed)
Resident with moderate amount of vomiting.  Medicated times one with zofran 8mg  sg.

## 2015-03-31 NOTE — Progress Notes (Signed)
Called to room to assist during endoscopic procedure.  Patient ID and intended procedure confirmed with present staff. Received instructions for my participation in the procedure from the performing physician.  

## 2015-03-31 NOTE — Op Note (Signed)
[facility]  [address] [place], [id number]   ENDOSCOPY PROCEDURE REPORT  PATIENT: Jaime Phillips, Jaime Phillips  MR#: [id number] BIRTHDATE: 1961/07/05 , 27  yrs. old GENDER: female ENDOSCOPIST: Jerene Bears, MD PROCEDURE DATE:  03/31/2015 PROCEDURE:  EGD, diagnostic and EGD w/ biopsy ASA CLASS:     Class III INDICATIONS:  epigastric pain, nausea, vomiting, and intermittent black stools. MEDICATIONS: Monitored anesthesia care and Propofol 230 mg IV TOPICAL ANESTHETIC: none  DESCRIPTION OF PROCEDURE: After the risks benefits and alternatives of the procedure were thoroughly explained, informed consent was obtained.  The LB [id number] endoscope was introduced through the mouth and advanced to the second portion of the duodenum , Without limitations.  The instrument was slowly withdrawn as the mucosa was fully examined.   ESOPHAGUS: There was LA Class D reflux esophagitis noted in the distal esophagus.  Free reflux was noted during the exam into the lower esophagus. Multiple biopsies performed in the distal esophagus. No esophageal varices seen  STOMACH: A 3 cm hiatal hernia was noted.   Moderate gastritis (inflammation) was found in the gastric antrum.  There were erosions present.  Cold forcep biopsies were taken at the gastric body, antrum and angularis to evaluate for h.  pylori. Gastropathy was seen in the more proximal stomach which was mild.  DUODENUM: Mild duodenal inflammation was found in the duodenal bulb. The second portion of the duodenum revealed  scattered lymphangiectasias, but was otherwise unremarkable.  Retroflexed views revealed a hiatal hernia.     The scope was then withdrawn from the patient and the procedure completed.  COMPLICATIONS: There were no immediate complications.    ENDOSCOPIC IMPRESSION: 1.   Moderate severe reflux esophagitis in the distal esophagus; no varices seen 2.   3 cm hiatal hernia 3.   Gastritis (inflammation)  was found in the gastric antrum and gastropathy in the proximal stomach; multiple biopsies 4.   Duodenal inflammation was found in the duodenal bulb  RECOMMENDATIONS: 1.  Await biopsy results 2.  Anti-reflux regimen to be followed 3.  Begin pantoprazole 40 mg daily 4.  Strict alcohol avoidance 5.  Office follow-up  eSigned:  Jerene Bears, MD 03/31/2015 10:25 AM    TX:MIWOEHO Adrian Blackwater, MD and The Patient  PATIENT NAME:  Jaime Phillips, Jaime Phillips MR#: [id number]

## 2015-04-01 ENCOUNTER — Telehealth: Payer: Self-pay | Admitting: *Deleted

## 2015-04-01 NOTE — Telephone Encounter (Signed)
Left message that we called for f/u 

## 2015-04-02 ENCOUNTER — Ambulatory Visit: Payer: Medicaid Other | Admitting: Family Medicine

## 2015-04-03 ENCOUNTER — Encounter: Payer: Self-pay | Admitting: Internal Medicine

## 2015-04-09 ENCOUNTER — Telehealth: Payer: Self-pay | Admitting: *Deleted

## 2015-04-09 NOTE — Telephone Encounter (Signed)
LVM to return call.

## 2015-04-09 NOTE — Telephone Encounter (Signed)
-----   Message from Boykin Nearing, MD sent at 03/25/2015 12:59 PM EDT ----- ECHO with normal EF Grade 1 diastolic dysfunction, plan to control BP

## 2015-04-09 NOTE — Telephone Encounter (Signed)
-----   Message from Boykin Nearing, MD sent at 03/18/2015  8:50 AM EDT ----- CMP normal  Continue current treatment

## 2015-04-17 ENCOUNTER — Telehealth: Payer: Self-pay | Admitting: Internal Medicine

## 2015-04-17 NOTE — Telephone Encounter (Signed)
Patient called stating that she stopped taking her BP med b/c it was giving her head aches and dizziness. Please f/u

## 2015-04-21 ENCOUNTER — Telehealth: Payer: Self-pay

## 2015-04-21 NOTE — Telephone Encounter (Signed)
Returned patient phone call Patient was prescribed aldactone and lasix last month Patient stated the medications make her feel dizzy and sees little specks Before  Her eyes Patient stopped taking the medications yesterday Please advise

## 2015-04-22 NOTE — Telephone Encounter (Signed)
Patient taking the combo to control fluid Patient advised to keep a low salt diet 2000 mg or less of salt to help prevent swelling  Advised to schedule f/u appt

## 2015-05-06 ENCOUNTER — Ambulatory Visit: Payer: Medicaid Other | Admitting: Internal Medicine

## 2015-06-05 ENCOUNTER — Encounter: Payer: Self-pay | Admitting: *Deleted

## 2015-07-08 ENCOUNTER — Ambulatory Visit (INDEPENDENT_AMBULATORY_CARE_PROVIDER_SITE_OTHER): Payer: Medicaid Other | Admitting: Internal Medicine

## 2015-07-08 ENCOUNTER — Encounter: Payer: Self-pay | Admitting: Internal Medicine

## 2015-07-08 ENCOUNTER — Other Ambulatory Visit (INDEPENDENT_AMBULATORY_CARE_PROVIDER_SITE_OTHER): Payer: Medicaid Other

## 2015-07-08 VITALS — BP 130/90 | HR 92 | Ht 63.0 in | Wt 132.2 lb

## 2015-07-08 DIAGNOSIS — K219 Gastro-esophageal reflux disease without esophagitis: Secondary | ICD-10-CM | POA: Diagnosis not present

## 2015-07-08 DIAGNOSIS — K5900 Constipation, unspecified: Secondary | ICD-10-CM

## 2015-07-08 DIAGNOSIS — F1010 Alcohol abuse, uncomplicated: Secondary | ICD-10-CM

## 2015-07-08 DIAGNOSIS — K2100 Gastro-esophageal reflux disease with esophagitis, without bleeding: Secondary | ICD-10-CM

## 2015-07-08 DIAGNOSIS — R1013 Epigastric pain: Secondary | ICD-10-CM | POA: Diagnosis not present

## 2015-07-08 DIAGNOSIS — K209 Esophagitis, unspecified without bleeding: Secondary | ICD-10-CM

## 2015-07-08 LAB — COMPREHENSIVE METABOLIC PANEL
ALT: 58 U/L — AB (ref 0–35)
AST: 110 U/L — ABNORMAL HIGH (ref 0–37)
Albumin: 4 g/dL (ref 3.5–5.2)
Alkaline Phosphatase: 254 U/L — ABNORMAL HIGH (ref 39–117)
BUN: 10 mg/dL (ref 6–23)
CHLORIDE: 105 meq/L (ref 96–112)
CO2: 25 meq/L (ref 19–32)
CREATININE: 0.68 mg/dL (ref 0.40–1.20)
Calcium: 9.5 mg/dL (ref 8.4–10.5)
GFR: 115.49 mL/min (ref >60.00)
GLUCOSE: 97 mg/dL (ref 70–99)
Potassium: 4.2 mEq/L (ref 3.5–5.1)
Sodium: 139 mEq/L (ref 135–145)
Total Bilirubin: 0.7 mg/dL (ref 0.2–1.2)
Total Protein: 7.3 g/dL (ref 6.0–8.3)

## 2015-07-08 LAB — CBC WITH DIFFERENTIAL/PLATELET
BASOS PCT: 0.5 % (ref 0.0–3.0)
Basophils Absolute: 0 10*3/uL (ref 0.0–0.1)
Eosinophils Absolute: 0 10*3/uL (ref 0.0–0.7)
Eosinophils Relative: 0.7 % (ref 0.0–5.0)
HCT: 35.3 % — ABNORMAL LOW (ref 36.0–46.0)
Hemoglobin: 11.5 g/dL — ABNORMAL LOW (ref 12.0–15.0)
LYMPHS ABS: 2.1 10*3/uL (ref 0.7–4.0)
Lymphocytes Relative: 34.6 % (ref 12.0–46.0)
MCHC: 32.4 g/dL (ref 30.0–36.0)
MCV: 99.3 fl (ref 78.0–100.0)
MONO ABS: 0.3 10*3/uL (ref 0.1–1.0)
Monocytes Relative: 5.2 % (ref 3.0–12.0)
NEUTROS ABS: 3.5 10*3/uL (ref 1.4–7.7)
NEUTROS PCT: 59 % (ref 43.0–77.0)
Platelets: 233 10*3/uL (ref 150.0–400.0)
RBC: 3.56 Mil/uL — ABNORMAL LOW (ref 3.87–5.11)
RDW: 17.6 % — AB (ref 11.5–15.5)
WBC: 5.9 10*3/uL (ref 4.0–10.5)

## 2015-07-08 LAB — PROTIME-INR
INR: 1.1 ratio — ABNORMAL HIGH (ref 0.8–1.0)
Prothrombin Time: 11.4 s (ref 9.6–13.1)

## 2015-07-08 MED ORDER — RANITIDINE HCL 75 MG PO TABS: 75.0000 mg | ORAL_TABLET | Freq: Every evening | ORAL | Status: DC

## 2015-07-08 MED ORDER — POLYETHYLENE GLYCOL 3350 17 GM/SCOOP PO POWD: ORAL | Status: DC

## 2015-07-08 MED ORDER — PANTOPRAZOLE SODIUM 40 MG PO TBEC: 40.0000 mg | DELAYED_RELEASE_TABLET | Freq: Every day | ORAL | Status: DC

## 2015-07-08 NOTE — Progress Notes (Signed)
   Subjective:    Patient ID: Jaime Phillips, female    DOB: 06/14/1961, 54 y.o.   MRN: [id number]  HPI Jaime Phillips is a 54 year old female with a history of alcohol abuse, alcoholic hepatitis, GERD with reflux esophagitis, internal hemorrhoids who is seen in follow-up. She also has a history of anxiety, depression, chronic pain and arthritis. She is here today with her husband. She reports on the whole she is feeling better from a GI perspective. She had an upper endoscopy performed on 03/31/2015 to evaluate epigastric pain, nausea vomiting and intermittent black stool. This showed severe reflux esophagitis without esophageal varices. 3 senna meter hiatal hernia and moderate gastritis was found in the stomach. Biopsies performed. There was mild bulbar duodenitis. Biopsies showed unremarkable gastric mucosa and inflamed squamous mucosa in the esophagus consistent with reflux. She was started on pantoprazole. This helped dramatically. She has run out. She's taking ranitidine 75 mg twice daily currently. Having rare reflux and denies dysphagia or odynophagia. Appetite has been good. Bowel movements have been more regular though occasionally hard. She is trying milk of magnesia for constipation. Black stools have resolved. She reports she is drinking less alcohol than ever before possibly to airplane bottles of gin but not daily. Prior to this she was having 4-5 airplane bottles daily. She does report occasionally epigastric discomfort which does not radiate.   Review of Systems As per history of present illness, otherwise negative  Current Medications, Allergies, Past Medical History, Past Surgical History, Family History and Social History were reviewed in [employer] record.     Objective:   Physical Exam BP 130/90 mmHg  Pulse 92  Ht 5\' 3"  (1.6 m)  Wt 132 lb 3.2 oz (59.966 kg)  BMI 23.42 kg/m2 Constitutional: Chronically ill-appearing. No distress. HEENT:  Normocephalic and atraumatic. Oropharynx is clear and moist. No oropharyngeal exudate. Conjunctivae are normal.  No scleral icterus. Neck: Neck supple. Trachea midline. Cardiovascular: Normal rate, regular rhythm and intact distal pulses.  Pulmonary/chest: Effort normal and breath sounds normal. No wheezing, rales or rhonchi.  Abdominal: Soft, nontender, mild distention. Bowel sounds active throughout. Extremities: no clubbing, cyanosis, or edema Neurological: Alert and oriented to person place and time. Skin: Skin is warm and dry.  Psychiatric: Normal mood and affect. Behavior is normal.  CMP, CBC, INR today    Assessment & Plan:  54 year old female with a history of alcohol abuse, alcoholic hepatitis, GERD with reflux esophagitis, internal hemorrhoids who is seen in follow-up.  1. GERD with reflux esophagitis -- history of esophagitis which improves when taking PPI. Her PPI prescription has expired. I will resume pantoprazole 40 mg daily. She can use Zantac 75 mg-150 mg each evening on an as-needed basis for breakthrough. GERD diet and alcohol avoidance  2. Constipation -- mild. Trial of MiraLAX 17 g daily  3.  Alcohol abuse/history of elevated liver enzymes -- she is drinking alcohol in the form of gin on most days. I've encouraged that she try to cut all alcohol out given her history of alcohol abuse and also elevated liver enzymes. No definite evidence of cirrhosis but I recommended repeat abdominal ultrasound with elastography.  4. CRC screening -- up-to-date with colonoscopy 25 minutes spent with the patient today. Greater than 50% was spent in counseling and coordination of care with the patient

## 2015-07-08 NOTE — Patient Instructions (Addendum)
Your physician has requested that you go to the basement for the following lab work before leaving today: CBC, CMP, INR  Please follow up with Dr [name] in 6 months.  We have sent the following medications to your pharmacy for you to pick up at your convenience: Miralax 17 grams (1 capful) daily Ranitidine 75 mg every evening as needed Pantoprazole 40 mg daily  You have been scheduled for an abdominal ultrasound elastography at [facility] (1st floor of hospital) on 07/31/15 at 7:00 am. Please arrive 15 minutes prior to your appointment for registration. Make certain not to have anything to eat or drink 6 hours prior to your appointment. Should you need to reschedule your appointment, please contact radiology at [phone]. This test typically takes about 30 minutes to perform.

## 2015-07-10 ENCOUNTER — Other Ambulatory Visit: Payer: Self-pay

## 2015-07-10 DIAGNOSIS — K7030 Alcoholic cirrhosis of liver without ascites: Secondary | ICD-10-CM

## 2015-07-31 ENCOUNTER — Ambulatory Visit (HOSPITAL_COMMUNITY)
Admission: RE | Admit: 2015-07-31 | Discharge: 2015-07-31 | Disposition: A | Discharge status: Home / Self Care | Payer: Medicaid Other | Source: Ambulatory Visit | Attending: Internal Medicine | Admitting: Internal Medicine

## 2015-07-31 DIAGNOSIS — F1010 Alcohol abuse, uncomplicated: Secondary | ICD-10-CM

## 2015-07-31 DIAGNOSIS — R1013 Epigastric pain: Secondary | ICD-10-CM

## 2015-07-31 DIAGNOSIS — K2100 Gastro-esophageal reflux disease with esophagitis, without bleeding: Secondary | ICD-10-CM

## 2015-08-05 ENCOUNTER — Telehealth: Payer: Self-pay | Admitting: *Deleted

## 2015-08-05 NOTE — Telephone Encounter (Signed)
-----   Message from Jerene Bears, MD sent at 08/05/2015  9:15 AM EST ----- Did she no-show this test? If so, we need to try to reschedule it Thanks JMP  ----- Message -----    From: SYSTEM    Sent: 08/05/2015  12:04 AM      To: Jerene Bears, MD

## 2015-08-05 NOTE — Telephone Encounter (Signed)
Patient's U/S with elastography was cancelled from 07/31/15 because she "ate breakfast." I have contacted patient who says that she will schedule the test when she can find a ride. She states she will call us back.

## 2015-08-07 ENCOUNTER — Telehealth: Payer: Self-pay | Admitting: Internal Medicine

## 2015-08-08 NOTE — Telephone Encounter (Signed)
Pt called and scheduled her [place] at [facility] 08/22/15@7am . Pt aware of appt.

## 2015-08-22 ENCOUNTER — Ambulatory Visit (HOSPITAL_COMMUNITY): Payer: Medicaid Other

## 2015-09-02 ENCOUNTER — Ambulatory Visit (HOSPITAL_COMMUNITY)
Admission: RE | Admit: 2015-09-02 | Discharge: 2015-09-02 | Disposition: A | Discharge status: Home / Self Care | Payer: Medicaid Other | Source: Ambulatory Visit | Attending: Internal Medicine | Admitting: Internal Medicine

## 2015-09-02 DIAGNOSIS — R932 Abnormal findings on diagnostic imaging of liver and biliary tract: Secondary | ICD-10-CM | POA: Diagnosis not present

## 2015-09-02 DIAGNOSIS — F1010 Alcohol abuse, uncomplicated: Secondary | ICD-10-CM | POA: Insufficient documentation

## 2015-09-02 DIAGNOSIS — K210 Gastro-esophageal reflux disease with esophagitis: Secondary | ICD-10-CM | POA: Diagnosis not present

## 2015-09-02 DIAGNOSIS — R1013 Epigastric pain: Secondary | ICD-10-CM | POA: Diagnosis not present

## 2015-10-02 ENCOUNTER — Encounter (HOSPITAL_COMMUNITY): Payer: Self-pay | Admitting: Emergency Medicine

## 2015-10-02 ENCOUNTER — Emergency Department (HOSPITAL_COMMUNITY)
Admission: EM | Admit: 2015-10-02 | Discharge: 2015-10-04 | Disposition: A | Discharge status: Home / Self Care | Payer: Medicaid Other | Attending: Emergency Medicine | Admitting: Emergency Medicine

## 2015-10-02 DIAGNOSIS — Z7982 Long term (current) use of aspirin: Secondary | ICD-10-CM | POA: Diagnosis not present

## 2015-10-02 DIAGNOSIS — Z86010 Personal history of colonic polyps: Secondary | ICD-10-CM | POA: Diagnosis not present

## 2015-10-02 DIAGNOSIS — R45851 Suicidal ideations: Secondary | ICD-10-CM | POA: Insufficient documentation

## 2015-10-02 DIAGNOSIS — Z862 Personal history of diseases of the blood and blood-forming organs and certain disorders involving the immune mechanism: Secondary | ICD-10-CM | POA: Diagnosis not present

## 2015-10-02 DIAGNOSIS — Z046 Encounter for general psychiatric examination, requested by authority: Secondary | ICD-10-CM | POA: Diagnosis present

## 2015-10-02 DIAGNOSIS — F329 Major depressive disorder, single episode, unspecified: Secondary | ICD-10-CM | POA: Diagnosis not present

## 2015-10-02 DIAGNOSIS — F17210 Nicotine dependence, cigarettes, uncomplicated: Secondary | ICD-10-CM | POA: Diagnosis not present

## 2015-10-02 DIAGNOSIS — Z8719 Personal history of other diseases of the digestive system: Secondary | ICD-10-CM | POA: Insufficient documentation

## 2015-10-02 DIAGNOSIS — F1020 Alcohol dependence, uncomplicated: Secondary | ICD-10-CM | POA: Diagnosis present

## 2015-10-02 DIAGNOSIS — G894 Chronic pain syndrome: Secondary | ICD-10-CM | POA: Diagnosis not present

## 2015-10-02 DIAGNOSIS — Z8711 Personal history of peptic ulcer disease: Secondary | ICD-10-CM | POA: Insufficient documentation

## 2015-10-02 DIAGNOSIS — M1990 Unspecified osteoarthritis, unspecified site: Secondary | ICD-10-CM | POA: Diagnosis not present

## 2015-10-02 DIAGNOSIS — Z79899 Other long term (current) drug therapy: Secondary | ICD-10-CM | POA: Insufficient documentation

## 2015-10-02 DIAGNOSIS — F333 Major depressive disorder, recurrent, severe with psychotic symptoms: Secondary | ICD-10-CM | POA: Diagnosis present

## 2015-10-02 DIAGNOSIS — Z8639 Personal history of other endocrine, nutritional and metabolic disease: Secondary | ICD-10-CM | POA: Insufficient documentation

## 2015-10-02 DIAGNOSIS — R269 Unspecified abnormalities of gait and mobility: Secondary | ICD-10-CM | POA: Diagnosis not present

## 2015-10-02 LAB — CBC
HEMATOCRIT: 36.6 % (ref 36.0–46.0)
HEMOGLOBIN: 11.5 g/dL — AB (ref 12.0–15.0)
MCH: 32.4 pg (ref 26.0–34.0)
MCHC: 31.4 g/dL (ref 30.0–36.0)
MCV: 103.1 fL — AB (ref 78.0–100.0)
Platelets: 226 10*3/uL (ref 150–400)
RBC: 3.55 MIL/uL — ABNORMAL LOW (ref 3.87–5.11)
RDW: 17 % — AB (ref 11.5–15.5)
WBC: 7.6 10*3/uL (ref 4.0–10.5)

## 2015-10-02 LAB — COMPREHENSIVE METABOLIC PANEL
ALT: 92 U/L — AB (ref 14–54)
AST: 341 U/L — AB (ref 15–41)
Albumin: 4.5 g/dL (ref 3.5–5.0)
Alkaline Phosphatase: 226 U/L — ABNORMAL HIGH (ref 38–126)
Anion gap: 17 — ABNORMAL HIGH (ref 5–15)
BILIRUBIN TOTAL: 0.5 mg/dL (ref 0.3–1.2)
BUN: 9 mg/dL (ref 6–20)
CALCIUM: 9.3 mg/dL (ref 8.9–10.3)
CO2: 34 mmol/L — AB (ref 22–32)
Chloride: 99 mmol/L — ABNORMAL LOW (ref 101–111)
Creatinine, Ser: 0.4 mg/dL — ABNORMAL LOW (ref 0.44–1.00)
GFR calc Af Amer: >60 mL/min (ref >60)
GFR calc non Af Amer: >60 mL/min (ref >60)
GLUCOSE: 96 mg/dL (ref 65–99)
Potassium: 3.1 mmol/L — ABNORMAL LOW (ref 3.5–5.1)
Sodium: 150 mmol/L — ABNORMAL HIGH (ref 135–145)
TOTAL PROTEIN: 8 g/dL (ref 6.5–8.1)

## 2015-10-02 LAB — RAPID URINE DRUG SCREEN, HOSP PERFORMED
Amphetamines: NOT DETECTED
BARBITURATES: NOT DETECTED
BENZODIAZEPINES: NOT DETECTED
Cocaine: NOT DETECTED
Opiates: NOT DETECTED
TETRAHYDROCANNABINOL: NOT DETECTED

## 2015-10-02 LAB — ETHANOL: Alcohol, Ethyl (B): 319 mg/dL (ref <5)

## 2015-10-02 LAB — SALICYLATE LEVEL: Salicylate Lvl: <4 mg/dL (ref 2.8–30.0)

## 2015-10-02 LAB — ACETAMINOPHEN LEVEL: Acetaminophen (Tylenol), Serum: <10 ug/mL — ABNORMAL LOW (ref 10–30)

## 2015-10-02 MED ORDER — IBUPROFEN 200 MG PO TABS
400.0000 mg | ORAL_TABLET | Freq: Once | ORAL | Status: AC
  Administered 2015-10-02: 400 mg via ORAL
  Filled 2015-10-02: qty 2

## 2015-10-02 MED ORDER — PANTOPRAZOLE SODIUM 40 MG PO TBEC
40.0000 mg | DELAYED_RELEASE_TABLET | Freq: Every day | ORAL | Status: DC
  Administered 2015-10-02 – 2015-10-04 (×3): 40 mg via ORAL
  Filled 2015-10-02 (×3): qty 1

## 2015-10-02 MED ORDER — LORAZEPAM 1 MG PO TABS: 0.0000 mg | ORAL_TABLET | Freq: Two times a day (BID) | ORAL | Status: DC

## 2015-10-02 MED ORDER — FUROSEMIDE 40 MG PO TABS
20.0000 mg | ORAL_TABLET | Freq: Two times a day (BID) | ORAL | Status: DC
  Administered 2015-10-02 – 2015-10-04 (×4): 20 mg via ORAL
  Filled 2015-10-02 (×4): qty 1

## 2015-10-02 MED ORDER — POTASSIUM CHLORIDE CRYS ER 20 MEQ PO TBCR
40.0000 meq | EXTENDED_RELEASE_TABLET | Freq: Once | ORAL | Status: AC
  Administered 2015-10-02: 40 meq via ORAL
  Filled 2015-10-02: qty 2

## 2015-10-02 MED ORDER — CO Q 10 10 MG PO CAPS: 5.0000 mg | ORAL_CAPSULE | Freq: Every day | ORAL | Status: DC

## 2015-10-02 MED ORDER — ASPIRIN EC 81 MG PO TBEC
81.0000 mg | DELAYED_RELEASE_TABLET | Freq: Every day | ORAL | Status: DC
  Administered 2015-10-03 – 2015-10-04 (×2): 81 mg via ORAL
  Filled 2015-10-02 (×2): qty 1

## 2015-10-02 MED ORDER — POLYETHYLENE GLYCOL 3350 17 GM/SCOOP PO POWD: 17.0000 g | ORAL | Status: DC

## 2015-10-02 MED ORDER — POLYETHYLENE GLYCOL 3350 17 G PO PACK
17.0000 g | PACK | Freq: Every day | ORAL | Status: DC
  Administered 2015-10-03 – 2015-10-04 (×2): 17 g via ORAL
  Filled 2015-10-02 (×2): qty 1

## 2015-10-02 MED ORDER — LORAZEPAM 1 MG PO TABS
0.0000 mg | ORAL_TABLET | Freq: Four times a day (QID) | ORAL | Status: DC
  Administered 2015-10-03 – 2015-10-04 (×4): 1 mg via ORAL
  Filled 2015-10-02 (×4): qty 1

## 2015-10-02 MED ORDER — NICOTINE 21 MG/24HR TD PT24
21.0000 mg | MEDICATED_PATCH | Freq: Every day | TRANSDERMAL | Status: DC
Start: 2015-10-02 — End: 2015-10-04
  Administered 2015-10-03 – 2015-10-04 (×2): 21 mg via TRANSDERMAL
  Filled 2015-10-02 (×2): qty 1

## 2015-10-02 MED ORDER — SPIRONOLACTONE 25 MG PO TABS
25.0000 mg | ORAL_TABLET | Freq: Two times a day (BID) | ORAL | Status: DC
  Administered 2015-10-02 – 2015-10-04 (×4): 25 mg via ORAL
  Filled 2015-10-02 (×6): qty 1

## 2015-10-02 MED ORDER — FAMOTIDINE 20 MG PO TABS
10.0000 mg | ORAL_TABLET | Freq: Every day | ORAL | Status: DC
  Administered 2015-10-02 – 2015-10-04 (×3): 10 mg via ORAL
  Filled 2015-10-02 (×3): qty 1

## 2015-10-02 NOTE — ED Notes (Signed)
Bed: [id number] Expected date:  Expected time:  Means of arrival:  Comments: TR 5
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
## 2015-10-02 NOTE — ED Provider Notes (Signed)
CSN: [id number]     Arrival date & time 10/02/15  1418 History   First MD Initiated Contact with Patient 10/02/15 1538     No chief complaint on file.  Chief complaint depressed  (Consider location/radiation/quality/duration/timing/severity/associated sxs/prior Treatment) HPI Level V caveat psychiatric illness. History is obtained from her son and from patient. Patient is under involuntary commitment for psychiatric evaluation. Involuntary commitment papers taken out by her son. Patient reports she's been depressed since her friend with whom she lived died one month ago. She's been drinking copious amounts of alcohol despite history of cirrhosis she also speaks about killing herself and that she is ready to die. On questioning patient she states that she is not suicidal however states "I can die at any time". She's been drinking alcohol earlier today.. Patient denies pain anywhere. No other associated symptoms. Past Medical History  Diagnosis Date  . Abnormal uterine bleeding   . Depression   . Arthritis   . Rectal bleeding     minor   . Headache(784.0)   . Macrocytic anemia   . PUD (peptic ulcer disease)   . Diverticulosis   . Internal hemorrhoids   . Mallory-Weiss tear   . Alcoholism ([place])   . Chronic pain syndrome   . Hyperplastic colon polyp   . LFT elevation   . Hypopotassemia   . Potassium (K) deficiency   . Hiatal hernia    Past Surgical History  Procedure Laterality Date  . Appendectomy    . Oophorectomy    . Shoulder surgery Left   . Colonoscopy    . 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 Orif periprosthetic fracture Right 05/28/2013    Procedure: OPEN REDUCTION INTERNAL FIXATION (ORIF) PERIPROSTHETIC FRACTURE;  Surgeon: Mauri Pole, MD;  [facility];  Service: Orthopedics;  Laterality: Right;  .  Esophagogastroduodenoscopy N/A 02/06/2014    Procedure: ESOPHAGOGASTRODUODENOSCOPY (EGD);  Surgeon: Milus Banister, MD;  Location: [place];  Service: Endoscopy;  Laterality: N/A;  . Total hip arthroplasty      left hip   Family History  Problem Relation Age of Onset  . Diabetes Mother   . Kidney disease Mother   . Heart disease Father   . Colon cancer Father     questionable  . Breast cancer Sister    Social History  Substance Use Topics  . Smoking status: Current Every Day Smoker -- 0.50 packs/day for 25 years    Types: Cigarettes  . Smokeless tobacco: Never Used     Comment: Tobacco info given to pt. 08/16/12  . Alcohol Use: 1.2 - 1.8 oz/week    2-3 Shots of liquor per week     Comment: pt quit drinking 2 months ago (today is 03/11/15)  Denies drug use OB History    Gravida Para Term Preterm AB TAB SAB Ectopic Multiple Living   2 1 1  1  1   1      Review of Systems  Unable to perform ROS: Psychiatric disorder  Musculoskeletal: Positive for gait problem.       Walks with cane sometimes. Not always  Psychiatric/Behavioral: Positive for suicidal ideas.      Allergies  Codeine and Morphine and related  Home Medications   Prior to Admission medications   Medication Sig Start Date End Date Taking? Authorizing Provider  aspirin  EC 81 MG tablet Take 81 mg by mouth daily.    Historical Provider, MD  Coenzyme Q10 (CO Q 10 PO) Take 5 mg by mouth daily.     Historical Provider, MD  furosemide (LASIX) 20 MG tablet Take 1 tablet (20 mg total) by mouth 2 (two) times daily. 03/17/15   Josalyn Funches, MD  pantoprazole (PROTONIX) 40 MG tablet Take 1 tablet (40 mg total) by mouth daily. 07/08/15   Jerene Bears, MD  polyethylene glycol powder (GLYCOLAX/MIRALAX) powder Take 1 capful (17 grams) dissolved in at least 8 ounces water/juice once daily. 07/08/15   Jerene Bears, MD  ranitidine (ZANTAC) 75 MG tablet Take 1 tablet (75 mg total) by mouth every evening. As needed 07/08/15   Jerene Bears, MD  spironolactone (ALDACTONE) 25 MG tablet Take 1 tablet (25 mg total) by mouth 2 (two) times daily. 03/17/15   Josalyn Funches, MD   BP 108/82 mmHg  Pulse 92  Temp(Src) 98.2 F (36.8 C) (Oral)  Resp 18  SpO2 92% Physical Exam  Constitutional: She is oriented to person, place, and time. She appears well-developed and well-nourished.  HENT:  Head: Normocephalic and atraumatic.  Eyes: Conjunctivae are normal. Pupils are equal, round, and reactive to light.  Neck: Neck supple. No tracheal deviation present. No thyromegaly present.  Cardiovascular: Normal rate and regular rhythm.   No murmur heard. Pulmonary/Chest: Effort normal and breath sounds normal.  Abdominal: Soft. Bowel sounds are normal. She exhibits no distension. There is no tenderness.  Musculoskeletal: Normal range of motion. She exhibits no edema or tenderness.  Neurological: She is alert and oriented to person, place, and time. No cranial nerve deficit. Coordination normal.  Gait normal and steady walks unassisted without cane  Skin: Skin is warm and dry. No rash noted.  Psychiatric: She has a normal mood and affect.  Nursing note and vitals reviewed.   ED Course  Procedures (including critical care time) Labs Review Labs Reviewed  COMPREHENSIVE METABOLIC PANEL  ETHANOL  SALICYLATE LEVEL  ACETAMINOPHEN LEVEL  CBC  URINE RAPID DRUG SCREEN, HOSP PERFORMED    Imaging Review No results found. I have personally reviewed and evaluated these images and lab results as part of my medical decision-making.   EKG Interpretation None     9:30 PM requesting medication for bilateral hip pain which is chronic. Advil ordered 1135 pm I went the patient's bedside. I was informed by the nurse that she complained of chest pain. Patient was asleep, easily arousable. She reports that she had chest pain which lasted 1 minute at anterior chest. She's had similar pain several times per week for the past 5 years, unchanged. No  further workup needed for chest pain as it is chronic Results for orders placed or performed during the hospital encounter of 10/02/15  Comprehensive metabolic panel  Result Value Ref Range   Sodium 150 (H) 135 - 145 mmol/L   Potassium 3.1 (L) 3.5 - 5.1 mmol/L   Chloride 99 (L) 101 - 111 mmol/L   CO2 34 (H) 22 - 32 mmol/L   Glucose, Bld 96 65 - 99 mg/dL   BUN 9 6 - 20 mg/dL   Creatinine, Ser 0.40 (L) 0.44 - 1.00 mg/dL   Calcium 9.3 8.9 - 10.3 mg/dL   Total Protein 8.0 6.5 - 8.1 g/dL   Albumin 4.5 3.5 - 5.0 g/dL   AST 341 (H) 15 - 41 U/L   ALT 92 (H) 14 - 54 U/L  Alkaline Phosphatase 226 (H) 38 - 126 U/L   Total Bilirubin 0.5 0.3 - 1.2 mg/dL   GFR calc non Af Amer >60 >60 mL/min   GFR calc Af Amer >60 >60 mL/min   Anion gap 17 (H) 5 - 15  Ethanol (ETOH)  Result Value Ref Range   Alcohol, Ethyl (B) 319 (HH) <5 mg/dL  Salicylate level  Result Value Ref Range   Salicylate Lvl 123456 2.8 - 30.0 mg/dL  Acetaminophen level  Result Value Ref Range   Acetaminophen (Tylenol), Serum <10 (L) 10 - 30 ug/mL  CBC  Result Value Ref Range   WBC 7.6 4.0 - 10.5 K/uL   RBC 3.55 (L) 3.87 - 5.11 MIL/uL   Hemoglobin 11.5 (L) 12.0 - 15.0 g/dL   HCT 36.6 36.0 - 46.0 %   MCV 103.1 (H) 78.0 - 100.0 fL   MCH 32.4 26.0 - 34.0 pg   MCHC 31.4 30.0 - 36.0 g/dL   RDW 17.0 (H) 11.5 - 15.5 %   Platelets 226 150 - 400 K/uL  Urine rapid drug screen (hosp performed) (Not at [facility])  Result Value Ref Range   Opiates NONE DETECTED NONE DETECTED   Cocaine NONE DETECTED NONE DETECTED   Benzodiazepines NONE DETECTED NONE DETECTED   Amphetamines NONE DETECTED NONE DETECTED   Tetrahydrocannabinol NONE DETECTED NONE DETECTED   Barbiturates NONE DETECTED NONE DETECTED   No results found.  MDM   First exam form filled out by me. TTS consult. Patient will be observed overnight to see psychiatrist in the morning. Elevated LFTs likely secondary to alcohol abuse.  Final diagnoses:  None   Dx#1 suicidal  ideation #2 alcohol abuse  #3 hypokalemia #4 chronic chest pain     Orlie Dakin, MD 10/02/15 2344

## 2015-10-02 NOTE — ED Notes (Signed)
Patient denies SI/HI/AVH, denies c/c. Reports she is depressed since her friend passed one month ago. Patient endorses drinking approximately three airplane bottles a day, last drink today. Ambulates with cane.

## 2015-10-02 NOTE — ED Notes (Addendum)
Sitter came out of room stating "she's c/o c/p."  In to room to question pt r/t same.  Pt stated "I don't have it now.  It comes and goes and I just take an 81 mg ASA.  It was a sharp pain."  Informed pt will inform MD of complaint.

## 2015-10-02 NOTE — ED Notes (Signed)
Dr. Winfred Leeds informed of pt's c/o chest pain to sitter & when RN assessed, pt denied stating "it only hurt once and I take a baby ASA for it."  No new orders received.

## 2015-10-02 NOTE — ED Notes (Addendum)
Jaime Phillips- Son  [phone]

## 2015-10-02 NOTE — ED Notes (Signed)
Spoke to Dr. Winfred Leeds r/t order for furosemide and spironolactone.  No new orders received.

## 2015-10-02 NOTE — ED Notes (Signed)
Patient and belongings have been wanded by security 

## 2015-10-02 NOTE — ED Notes (Signed)
Bed: [id number] Expected date:  Expected time:  Means of arrival:  Comments: TR 5

## 2015-10-02 NOTE — ED Notes (Signed)
Pt informed need urine specimen.  Pt assisted to BR, unable to provide specimen @ this time.

## 2015-10-03 DIAGNOSIS — F333 Major depressive disorder, recurrent, severe with psychotic symptoms: Secondary | ICD-10-CM | POA: Diagnosis not present

## 2015-10-03 DIAGNOSIS — F1020 Alcohol dependence, uncomplicated: Secondary | ICD-10-CM | POA: Diagnosis not present

## 2015-10-03 MED ORDER — FLUOXETINE HCL 10 MG PO CAPS
10.0000 mg | ORAL_CAPSULE | Freq: Every day | ORAL | Status: DC
  Administered 2015-10-03 – 2015-10-04 (×2): 10 mg via ORAL
  Filled 2015-10-03 (×2): qty 1
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
## 2015-10-03 NOTE — Consult Note (Signed)
[facility] Consult   Reason for Consult: depression, suicidal thoughts, alcohol abuse Referring Physician:  EDP Patient Identification: Georga Stys MRN:  [id number] Principal Diagnosis: Alcohol use disorder, severe, dependence ([place]) Diagnosis:   Patient Active Problem List   Diagnosis Date Noted  . Alcohol use disorder, severe, dependence ([place]) [F10.20] 10/03/2015    Priority: High  . Major psychotic depression, recurrent ([place]) [F33.3] 10/03/2015    Priority: High  . Ankle edema [R60.9] 03/17/2015  . Cirrhosis ([place]) [K74.60] 02/24/2015  . Black stools [K92.1] 02/24/2015  . Abdominal pain, epigastric [R10.13] 01/29/2015  . Nausea with vomiting [R11.2] 01/29/2015  . Ulcerative esophagitis [K22.10] 01/29/2015  . Dysphagia, pharyngoesophageal phase [R13.14] 01/29/2015  . Hypokalemia [E87.6] 12/19/2014  . Hypomagnesemia [E83.42] 12/19/2014  . Malnutrition of moderate degree ([place]) [E44.0] 12/18/2014  . Alcoholic ketoacidosis [Z30.8] 12/17/2014  . Alcohol abuse, in remission [F10.10] 02/14/2014  . Essential hypertension, benign [I10] 02/14/2014  . Smoking [Z72.0] 02/14/2014  . Upper GI bleed [K92.2] 02/06/2014  . Chronic pain syndrome [G89.4] 02/06/2014  . Gastro-esophageal reflux [K21.9] 02/06/2014  . Dental caries [K02.9] 01/17/2014  . Periprosthetic fracture of hip ([place]) [id number].Higginson, Gem 05/31/2013  . Hip fracture ([place]) [S72.009A] 05/27/2013  . Acute blood loss anemia [D62] 04/11/2013  . S/P right THA, AA [Z96.60] 04/10/2013    Total Time spent with patient: 45 minutes  Subjective:   Alzena Gerber is a 55 y.o. female patient admitted with alcohol abuse and depression.  HPI: Patient with prior history of alcohol use disorder and depression who was involuntary commitment for psychiatric evaluation by her son. Patient  reports she's been getting increasingly more  depressed since her boyfriend with whom she lived died over one month ago. Also, she has   been drinking copious amounts of alcohol despite history of cirrhosis she also speaks about killing herself and constantly says she is ready to die especially when she is intoxicated. Patient denies psychosis, delusional thinking or other drugs of abuse.  Past Psychiatric History: Alcohol use diasorder  Risk to Self: Suicidal Ideation:passive Suicidal Intent: No Is patient at risk for suicide?: No Suicidal Plan?: No Access to Means: No What has been your use of drugs/alcohol within the last 12 months?:  (n/a) How many times?:  (n/a) Other Self Harm Risks:  (n/a) Triggers for Past Attempts: Other (Comment) Intentional Self Injurious Behavior: None Risk to Others: Homicidal Ideation: No Thoughts of Harm to Others: No Current Homicidal Intent: No Current Homicidal Plan: No Access to Homicidal Means: No Identified Victim:  (n/a) History of harm to others?: No Assessment of Violence: None Noted Violent Behavior Description:  (patient aggitated and angry ) Does patient have access to weapons?: No Criminal Charges Pending?: No Does patient have a court date: No Prior Inpatient Therapy: Prior Inpatient Therapy: No Prior Therapy Dates:  (n/a) Prior Therapy Facilty/Provider(s):  (n/a) Reason for Treatment:  (n/a) Prior Outpatient Therapy: Prior Outpatient Therapy: No Prior Therapy Dates:  (n/a) Prior Therapy Facilty/Provider(s):  (n/a) Reason for Treatment:  (n/a) Does patient have an ACCT team?: No Does patient have Intensive In-House Services?  : No Does patient have [facility] services? : No Does patient have P4CC services?: No  Past Medical History:  Past Medical History  Diagnosis Date  . Abnormal uterine bleeding   . Depression   . Arthritis   . Rectal bleeding     minor   . Headache(784.0)   . Macrocytic anemia   . PUD (peptic ulcer disease)   . Diverticulosis   .  Internal hemorrhoids   . Mallory-Weiss tear   . Alcoholism ([place])   . Chronic pain syndrome   .  Hyperplastic colon polyp   . LFT elevation   . Hypopotassemia   . Potassium (K) deficiency   . Hiatal hernia     Past Surgical History  Procedure Laterality Date  . Appendectomy    . Oophorectomy    . Shoulder surgery Left   . Colonoscopy    . 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 Orif periprosthetic fracture Right 05/28/2013    Procedure: OPEN REDUCTION INTERNAL FIXATION (ORIF) PERIPROSTHETIC FRACTURE;  Surgeon: Mauri Pole, MD;  Location: [facility];  Service: Orthopedics;  Laterality: Right;  . Esophagogastroduodenoscopy N/A 02/06/2014    Procedure: ESOPHAGOGASTRODUODENOSCOPY (EGD);  Surgeon: Milus Banister, MD;  Location: [facility];  Service: Endoscopy;  Laterality: N/A;  . Total hip arthroplasty      left hip   Family History:  Family History  Problem Relation Age of Onset  . Diabetes Mother   . Kidney disease Mother   . Heart disease Father   . Colon cancer Father     questionable  . Breast cancer Sister    Family Psychiatric  History: Social History:  History  Alcohol Use  . 1.2 - 1.8 oz/week  . 2-3 Shots of liquor per week    Comment: pt quit drinking 2 months ago (today is 03/11/15)     History  Drug Use No    Social History   Social History  . Marital Status: Single    Spouse Name: N/A  . Number of Children: 1  . Years of Education: N/A   Occupational History  . disabled    Social History Main Topics  . Smoking status: Current Every Day Smoker -- 0.50 packs/day for 25 years    Types: Cigarettes  . Smokeless tobacco: Never Used     Comment: Tobacco info given to pt. 08/16/12  . Alcohol Use: 1.2 - 1.8 oz/week    2-3 Shots of liquor per week     Comment: pt quit drinking 2 months ago (today is 03/11/15)  . Drug Use: No  . Sexual Activity: Not Currently   Other Topics Concern  . None   Social  History Narrative   Additional Social History:    Allergies:   Allergies  Allergen Reactions  . Codeine Other (See Comments)    hallucinating  . Morphine And Related     Made head feel funny    Labs:  Results for orders placed or performed during the hospital encounter of 10/02/15 (from the past 48 hour(s))  Comprehensive metabolic panel     Status: Abnormal   Collection Time: 10/02/15  4:22 PM  Result Value Ref Range   Sodium 150 (H) 135 - 145 mmol/L   Potassium 3.1 (L) 3.5 - 5.1 mmol/L   Chloride 99 (L) 101 - 111 mmol/L   CO2 34 (H) 22 - 32 mmol/L   Glucose, Bld 96 65 - 99 mg/dL   BUN 9 6 - 20 mg/dL   Creatinine, Ser 0.40 (L) 0.44 - 1.00 mg/dL   Calcium 9.3 8.9 - 10.3 mg/dL   Total Protein 8.0 6.5 - 8.1 g/dL   Albumin 4.5 3.5 - 5.0 g/dL   AST 341 (H) 15 - 41 U/L   ALT 92 (  H) 14 - 54 U/L   Alkaline Phosphatase 226 (H) 38 - 126 U/L   Total Bilirubin 0.5 0.3 - 1.2 mg/dL   GFR calc non Af Amer >60 >60 mL/min   GFR calc Af Amer >60 >60 mL/min    Comment: (NOTE) The eGFR has been calculated using the CKD EPI equation. This calculation has not been validated in all clinical situations. eGFR's persistently <60 mL/min signify possible Chronic Kidney Disease.    Anion gap 17 (H) 5 - 15  Ethanol (ETOH)     Status: Abnormal   Collection Time: 10/02/15  4:22 PM  Result Value Ref Range   Alcohol, Ethyl (B) 319 (HH) <5 mg/dL    Comment:        LOWEST DETECTABLE LIMIT FOR SERUM ALCOHOL IS 5 mg/dL FOR MEDICAL PURPOSES ONLY CRITICAL RESULT CALLED TO, READ BACK BY AND VERIFIED WITH: NATALIE DAVIS,RN [id number] @ 1448 BY J SCOTTON   Salicylate level     Status: None   Collection Time: 10/02/15  4:22 PM  Result Value Ref Range   Salicylate Lvl <1.8 2.8 - 30.0 mg/dL  Acetaminophen level     Status: Abnormal   Collection Time: 10/02/15  4:22 PM  Result Value Ref Range   Acetaminophen (Tylenol), Serum <10 (L) 10 - 30 ug/mL    Comment:        THERAPEUTIC CONCENTRATIONS  VARY SIGNIFICANTLY. A RANGE OF 10-30 ug/mL MAY BE AN EFFECTIVE CONCENTRATION FOR MANY PATIENTS. HOWEVER, SOME ARE BEST TREATED AT CONCENTRATIONS OUTSIDE THIS RANGE. ACETAMINOPHEN CONCENTRATIONS >150 ug/mL AT 4 HOURS AFTER INGESTION AND >50 ug/mL AT 12 HOURS AFTER INGESTION ARE OFTEN ASSOCIATED WITH TOXIC REACTIONS.   CBC     Status: Abnormal   Collection Time: 10/02/15  4:22 PM  Result Value Ref Range   WBC 7.6 4.0 - 10.5 K/uL   RBC 3.55 (L) 3.87 - 5.11 MIL/uL   Hemoglobin 11.5 (L) 12.0 - 15.0 g/dL   HCT 36.6 36.0 - 46.0 %   MCV 103.1 (H) 78.0 - 100.0 fL   MCH 32.4 26.0 - 34.0 pg   MCHC 31.4 30.0 - 36.0 g/dL   RDW 17.0 (H) 11.5 - 15.5 %   Platelets 226 150 - 400 K/uL  Urine rapid drug screen (hosp performed) (Not at [facility])     Status: None   Collection Time: 10/02/15  7:31 PM  Result Value Ref Range   Opiates NONE DETECTED NONE DETECTED   Cocaine NONE DETECTED NONE DETECTED   Benzodiazepines NONE DETECTED NONE DETECTED   Amphetamines NONE DETECTED NONE DETECTED   Tetrahydrocannabinol NONE DETECTED NONE DETECTED   Barbiturates NONE DETECTED NONE DETECTED    Comment:        DRUG SCREEN FOR MEDICAL PURPOSES ONLY.  IF CONFIRMATION IS NEEDED FOR ANY PURPOSE, NOTIFY LAB WITHIN 5 DAYS.        LOWEST DETECTABLE LIMITS FOR URINE DRUG SCREEN Drug Class       Cutoff (ng/mL) Amphetamine      1000 Barbiturate      200 Benzodiazepine   563 Tricyclics       149 Opiates          300 Cocaine          300 THC              50     Current Facility-Administered Medications  Medication Dose Route Frequency Provider Last Rate Last Dose  . aspirin EC tablet 81 mg  81 mg  Oral Daily Orlie Dakin, MD   81 mg at 10/03/15 0935  . famotidine (PEPCID) tablet 10 mg  10 mg Oral Daily Orlie Dakin, MD   10 mg at 10/03/15 0934  . FLUoxetine (PROZAC) capsule 10 mg  10 mg Oral Daily Emilie Carp, MD      . furosemide (LASIX) tablet 20 mg  20 mg Oral BID Orlie Dakin, MD   20 mg at  10/03/15 0935  . LORazepam (ATIVAN) tablet 0-4 mg  0-4 mg Oral 4 times per day Orlie Dakin, MD   1 mg at 10/03/15 0549   Followed by  . [START ON 10/04/2015] LORazepam (ATIVAN) tablet 0-4 mg  0-4 mg Oral Q12H Orlie Dakin, MD      . nicotine (NICODERM CQ - dosed in mg/24 hours) patch 21 mg  21 mg Transdermal Daily Orlie Dakin, MD   21 mg at 10/03/15 0935  . pantoprazole (PROTONIX) EC tablet 40 mg  40 mg Oral Daily Orlie Dakin, MD   40 mg at 10/03/15 0935  . polyethylene glycol (MIRALAX / GLYCOLAX) packet 17 g  17 g Oral Daily Orlie Dakin, MD   17 g at 10/03/15 0935  . spironolactone (ALDACTONE) tablet 25 mg  25 mg Oral BID Orlie Dakin, MD   25 mg at 10/03/15 0935   Current Outpatient Prescriptions  Medication Sig Dispense Refill  . aspirin 325 MG tablet Take 325 mg by mouth daily as needed for mild pain or moderate pain.    . Coenzyme Q10 (CO Q 10 PO) Take 5 mg by mouth daily.     Marland Kitchen ibuprofen (ADVIL,MOTRIN) 800 MG tablet Take 800 mg by mouth every 8 (eight) hours as needed for headache, mild pain or moderate pain.    Marland Kitchen thiamine (VITAMIN B-1) 100 MG tablet Take 100 mg by mouth daily.    . vitamin E 400 UNIT capsule Take 400 Units by mouth daily.    . furosemide (LASIX) 20 MG tablet Take 1 tablet (20 mg total) by mouth 2 (two) times daily. (Patient not taking: Reported on 10/02/2015) 60 tablet 2  . pantoprazole (PROTONIX) 40 MG tablet Take 1 tablet (40 mg total) by mouth daily. (Patient not taking: Reported on 10/02/2015) 90 tablet 1  . polyethylene glycol powder (GLYCOLAX/MIRALAX) powder Take 1 capful (17 grams) dissolved in at least 8 ounces water/juice once daily. (Patient not taking: Reported on 10/02/2015) 1581 g 1  . ranitidine (ZANTAC) 75 MG tablet Take 1 tablet (75 mg total) by mouth every evening. As needed (Patient not taking: Reported on 10/02/2015) 90 tablet 0  . spironolactone (ALDACTONE) 25 MG tablet Take 1 tablet (25 mg total) by mouth 2 (two) times daily. (Patient not taking:  Reported on 10/02/2015) 60 tablet 2    Musculoskeletal: Strength & Muscle Tone: within normal limits Gait & Station: unsteady Patient leans: Right  Psychiatric Specialty Exam: Review of Systems  Constitutional: Positive for malaise/fatigue.  HENT: Negative.   Eyes: Negative.   Respiratory: Negative.   Cardiovascular: Negative.   Gastrointestinal: Positive for nausea.  Genitourinary: Negative.   Musculoskeletal: Positive for myalgias.  Skin: Negative.   Neurological: Positive for tremors and weakness.  Endo/Heme/Allergies: Negative.   Psychiatric/Behavioral: Positive for depression, suicidal ideas and substance abuse. The patient is nervous/anxious and has insomnia.     Blood pressure 160/79, pulse 100, temperature 97.9 F (36.6 C), temperature source Oral, resp. rate 20, SpO2 95 %.There is no weight on file to calculate BMI.  General Appearance: Casual  Eye Contact::  Good  Speech:  Clear and Coherent  Volume:  Normal  Mood:  Depressed and Dysphoric  Affect:  Constricted  Thought Process:  Goal Directed  Orientation:  Full (Time, Place, and Person)  Thought Content:  Negative  Suicidal Thoughts:  passive thoughts  Homicidal Thoughts:  No  Memory:  Immediate;   Good Recent;   Good Remote;   Good  Judgement:  Impaired  Insight:  Shallow  Psychomotor Activity:  Normal  Concentration:  Fair  Recall:  Good  Fund of Knowledge:Good  Language: Good  Akathisia:  No  Handed:  Right  AIMS (if indicated):     Assets:  Communication Skills  ADL's:  Intact  Cognition: WNL  Sleep:   poor   Treatment Plan Summary: Daily contact with patient to assess and evaluate symptoms and progress in treatment. Medication management: Prozac 71m daily for depression.  Continue Ativan alcohol detoxification  Disposition: Recommend psychiatric Inpatient admission when medically cleared. Supportive therapy provided about ongoing stressors. Will benefit from placement in dual diagnosis  psychiatric Unit  ACorena Pilgrim MD 10/03/2015 10:56 AM

## 2015-10-03 NOTE — Progress Notes (Signed)
Patient does not meet criteria for admission. Pt is not voicing suicidal ideation (several noted from nursing indicate pt has been denying SI).  Main issue appears to be alcohol dep.  Consider referral to detox.

## 2015-10-03 NOTE — ED Provider Notes (Signed)
Pt questioning lasix. Per PCP notes, pt is to be on both lasix and aldactone.   Virgel Manifold, MD 10/03/15 1325

## 2015-10-03 NOTE — Progress Notes (Addendum)
Entered in d/c instructions  Medicine, [facility] Family Schedule an appointment as soon as possible for a visit [address] [place], [place] [address] [phone] This is your assigned Medicaid [place] access doctor If you prefer another contact [place] assigned your dr*You may receive a bill if you go to any family Dr not assigned to you   Medicaid [place] Access Covered Patient [place]: [place], [place] [address] [email or web address] Use this website to assist with understanding your coverage & to renew application As a Medicaid client you MUST contact DSS/SSI each time you change address, move to another [place] or another state to keep your address updated  [place] Medicaid Transportation to Dr appts if you are have full Medicaid: [id number], [place], [place] Go on 11/03/2015 You have an upcoming appt on 11/03/15 at 71 with Dr [name] for follow up [address] [place] [address] [phone]

## 2015-10-04 NOTE — ED Notes (Signed)
Pt resting comfortably with sitter at bedside.  Pt is ambulatory to the BR with steady gait.

## 2015-10-04 NOTE — ED Notes (Signed)
Family at bedside.  friend

## 2015-10-04 NOTE — ED Notes (Signed)
Patient's friend at bedside to transport patient.

## 2015-10-04 NOTE — ED Notes (Signed)
Per [facility], pt is discharged.  [facility] EDP notified and will put pt up for discharge.  Spoke to pt's son whom will be picking pt up.  He will take 3 hours to get here.  Pt made aware.  She reports that her friend is coming to pick her up instead.

## 2015-10-04 NOTE — ED Notes (Signed)
Teague Posten (son) [phone]

## 2015-10-04 NOTE — Progress Notes (Signed)
Reassessment:  Patient denies SI/HI, A/VH, and other self-injurious behaviors.  Patient reports compliance with medications and unit rules.    Chesley Noon, MSW, Darlyn Read [facility] Triage Specialist [phone]

## 2015-11-01 ENCOUNTER — Emergency Department (HOSPITAL_COMMUNITY)
Admission: EM | Admit: 2015-11-01 | Discharge: 2015-11-03 | Disposition: A | Discharge status: Other Acute Inpatient Hospital | Payer: Medicaid Other | Attending: Emergency Medicine | Admitting: Emergency Medicine

## 2015-11-01 ENCOUNTER — Encounter (HOSPITAL_COMMUNITY): Payer: Self-pay | Admitting: Emergency Medicine

## 2015-11-01 DIAGNOSIS — M1990 Unspecified osteoarthritis, unspecified site: Secondary | ICD-10-CM | POA: Insufficient documentation

## 2015-11-01 DIAGNOSIS — F32A Depression, unspecified: Secondary | ICD-10-CM

## 2015-11-01 DIAGNOSIS — Z8711 Personal history of peptic ulcer disease: Secondary | ICD-10-CM | POA: Insufficient documentation

## 2015-11-01 DIAGNOSIS — Z8719 Personal history of other diseases of the digestive system: Secondary | ICD-10-CM | POA: Insufficient documentation

## 2015-11-01 DIAGNOSIS — Z8639 Personal history of other endocrine, nutritional and metabolic disease: Secondary | ICD-10-CM | POA: Insufficient documentation

## 2015-11-01 DIAGNOSIS — F1020 Alcohol dependence, uncomplicated: Secondary | ICD-10-CM

## 2015-11-01 DIAGNOSIS — F1010 Alcohol abuse, uncomplicated: Secondary | ICD-10-CM

## 2015-11-01 DIAGNOSIS — F17210 Nicotine dependence, cigarettes, uncomplicated: Secondary | ICD-10-CM | POA: Insufficient documentation

## 2015-11-01 DIAGNOSIS — Z86010 Personal history of colonic polyps: Secondary | ICD-10-CM | POA: Insufficient documentation

## 2015-11-01 DIAGNOSIS — F1094 Alcohol use, unspecified with alcohol-induced mood disorder: Secondary | ICD-10-CM

## 2015-11-01 DIAGNOSIS — F329 Major depressive disorder, single episode, unspecified: Secondary | ICD-10-CM

## 2015-11-01 DIAGNOSIS — F333 Major depressive disorder, recurrent, severe with psychotic symptoms: Secondary | ICD-10-CM | POA: Insufficient documentation

## 2015-11-01 DIAGNOSIS — G894 Chronic pain syndrome: Secondary | ICD-10-CM | POA: Insufficient documentation

## 2015-11-01 DIAGNOSIS — F1024 Alcohol dependence with alcohol-induced mood disorder: Secondary | ICD-10-CM | POA: Insufficient documentation

## 2015-11-01 DIAGNOSIS — Z79899 Other long term (current) drug therapy: Secondary | ICD-10-CM | POA: Insufficient documentation

## 2015-11-01 DIAGNOSIS — Z7982 Long term (current) use of aspirin: Secondary | ICD-10-CM | POA: Insufficient documentation

## 2015-11-01 DIAGNOSIS — Z862 Personal history of diseases of the blood and blood-forming organs and certain disorders involving the immune mechanism: Secondary | ICD-10-CM | POA: Insufficient documentation

## 2015-11-01 LAB — COMPREHENSIVE METABOLIC PANEL
ALT: 54 U/L (ref 14–54)
ANION GAP: 15 (ref 5–15)
AST: 172 U/L — ABNORMAL HIGH (ref 15–41)
Albumin: 3.9 g/dL (ref 3.5–5.0)
Alkaline Phosphatase: 143 U/L — ABNORMAL HIGH (ref 38–126)
BUN: 8 mg/dL (ref 6–20)
CALCIUM: 9 mg/dL (ref 8.9–10.3)
CHLORIDE: 107 mmol/L (ref 101–111)
CO2: 29 mmol/L (ref 22–32)
Creatinine, Ser: 0.44 mg/dL (ref 0.44–1.00)
GFR calc non Af Amer: >60 mL/min (ref >60)
Glucose, Bld: 91 mg/dL (ref 65–99)
Potassium: 2.5 mmol/L — CL (ref 3.5–5.1)
SODIUM: 151 mmol/L — AB (ref 135–145)
Total Bilirubin: 0.5 mg/dL (ref 0.3–1.2)
Total Protein: 7.2 g/dL (ref 6.5–8.1)

## 2015-11-01 LAB — CBC
HCT: 33.3 % — ABNORMAL LOW (ref 36.0–46.0)
HEMOGLOBIN: 10.7 g/dL — AB (ref 12.0–15.0)
MCH: 30.3 pg (ref 26.0–34.0)
MCHC: 32.1 g/dL (ref 30.0–36.0)
MCV: 94.3 fL (ref 78.0–100.0)
PLATELETS: 284 10*3/uL (ref 150–400)
RBC: 3.53 MIL/uL — AB (ref 3.87–5.11)
RDW: 15.9 % — ABNORMAL HIGH (ref 11.5–15.5)
WBC: 9 10*3/uL (ref 4.0–10.5)

## 2015-11-01 LAB — ACETAMINOPHEN LEVEL

## 2015-11-01 LAB — MAGNESIUM: MAGNESIUM: 1.6 mg/dL — AB (ref 1.7–2.4)

## 2015-11-01 LAB — SALICYLATE LEVEL

## 2015-11-01 LAB — ETHANOL: ALCOHOL ETHYL (B): 332 mg/dL — AB (ref <5)

## 2015-11-01 MED ORDER — POTASSIUM CHLORIDE CRYS ER 20 MEQ PO TBCR
40.0000 meq | EXTENDED_RELEASE_TABLET | Freq: Once | ORAL | Status: AC
  Administered 2015-11-01: 40 meq via ORAL
  Filled 2015-11-01: qty 2

## 2015-11-01 MED ORDER — LORAZEPAM 1 MG PO TABS
0.0000 mg | ORAL_TABLET | Freq: Four times a day (QID) | ORAL | Status: DC
  Administered 2015-11-02: 1 mg via ORAL
  Filled 2015-11-01: qty 1

## 2015-11-01 MED ORDER — MAGNESIUM SULFATE 2 GM/50ML IV SOLN
2.0000 g | Freq: Once | INTRAVENOUS | Status: AC
  Administered 2015-11-02: 2 g via INTRAVENOUS
  Filled 2015-11-01: qty 50

## 2015-11-01 MED ORDER — LORAZEPAM 1 MG PO TABS: 0.0000 mg | ORAL_TABLET | Freq: Two times a day (BID) | ORAL | Status: DC

## 2015-11-01 MED ORDER — POTASSIUM CHLORIDE CRYS ER 20 MEQ PO TBCR
20.0000 meq | EXTENDED_RELEASE_TABLET | Freq: Two times a day (BID) | ORAL | Status: DC
  Administered 2015-11-02 – 2015-11-03 (×3): 20 meq via ORAL
  Filled 2015-11-01 (×3): qty 1

## 2015-11-01 MED ORDER — POTASSIUM CHLORIDE 10 MEQ/100ML IV SOLN
10.0000 meq | INTRAVENOUS | Status: AC
  Administered 2015-11-01: 10 meq via INTRAVENOUS
  Filled 2015-11-01: qty 100

## 2015-11-01 NOTE — ED Notes (Signed)
MAURICE Hibberd [phone]. PLEASE CALL HIM WITH ANY QUESTIONS OR CONCERNS.

## 2015-11-01 NOTE — BH Assessment (Addendum)
Tele Assessment Note   Jaime Phillips is a single 55 y.o. African-American female who presents unaccompanied to [facility] ED after being petitioned for involuntary commitment by her son, Jaime Phillips [phone]. [employer] and Petition states: "Danger to self and others. Respondent has unknown diagnosis and is under doctor's care. According to petitioner, respondent is not sleeping or tending to personal hygiene. Respondent is expressing desire to commit suicide stating "she wants to kill herself." Respondent is apparently hallucinating as well stating she sees the dead father of her son all the time. Also makes attempts to fight her son. Respondent is also abusing alcohol and maybe crack cocaine. Every time petitioner finds his mother she is drunk laying in the yard or on the floor."  Pt acknowledges she is depressed due to multiple stressors. She says her significant other of twenty seven years died 2015-10-01 and she is grieving this loss. She is tearful as she explains in detail the circumstances of his death. She states that her son is taking things from her house. She says she cannot afford her residence. Pt reports symptoms including crying spells, loss of interest in usual pleasures, fatigue, irritability, decreased concentration, decreased sleep and feelings of hopelessness. She says her appetites is poor and she has lost approximately ten pounds in the past month. She denies current suicidal ideation or any history of suicide attempts. She denies any intentional self-injurious behavior. Pt says she is unsteady on her feet and uses a cane or Vertz because she has history of falls. She denies homicidal ideation or history of violence. She denies any auditory or visual hallucinations.   Pt reports she drinks alcohol "occasionally." Pt's medical record indicates she drinks daily. Pt says she drank three airplane-size bottles of gin today. She denies any other substance use. Pt's blood  alcohol level is 332, urine drug screen is in process.  Pt is dressed in hospital scrubs. She alert, oriented x4 but appears intoxicated with slurred speech and normal motor behavior. Eye contact is good and Pt is tearful. Pt's mood is depressed and affect is congruent with mood. Thought process is coherent and relevant. There is no indication Pt is currently responding to internal stimuli or experiencing delusional thought content. Pt was cooperative throughout assessment.   Diagnosis: Alcohol Use Disorder, Severe; Unspecified Depressive Disorder  Past Medical History:  Past Medical History  Diagnosis Date  . Abnormal uterine bleeding   . Depression   . Arthritis   . Rectal bleeding     minor   . Headache(784.0)   . Macrocytic anemia   . PUD (peptic ulcer disease)   . Diverticulosis   . Internal hemorrhoids   . Mallory-Weiss tear   . Alcoholism ([place])   . Chronic pain syndrome   . Hyperplastic colon polyp   . LFT elevation   . Hypopotassemia   . Potassium (K) deficiency   . Hiatal hernia     Past Surgical History  Procedure Laterality Date  . Appendectomy    . Oophorectomy    . Shoulder surgery Left   . Colonoscopy    . 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 Orif periprosthetic fracture Right 05/28/2013    Procedure: OPEN REDUCTION INTERNAL FIXATION (ORIF) PERIPROSTHETIC FRACTURE;  Surgeon: Mauri Pole, MD;  Location: [facility];  Service: Orthopedics;  Laterality: Right;  .  Esophagogastroduodenoscopy N/A 02/06/2014    Procedure: ESOPHAGOGASTRODUODENOSCOPY (EGD);  Surgeon: Milus Banister, MD;  Location: [facility];  Service: Endoscopy;  Laterality: N/A;  . Total hip arthroplasty      left hip    Family History:  Family History  Problem Relation Age of Onset  . Diabetes Mother   . Kidney disease Mother   . Heart  disease Father   . Colon cancer Father     questionable  . Breast cancer Sister     Social History:  reports that she has been smoking Cigarettes.  She has a 12.5 pack-year smoking history. She has never used smokeless tobacco. She reports that she drinks about 1.2 - 1.8 oz of alcohol per week. She reports that she does not use illicit drugs.  Additional Social History:  Alcohol / Drug Use Pain Medications: SEE MAR Prescriptions: SEE MAR Over the Counter: SEE MAR History of alcohol / drug use?: Yes Longest period of sobriety (when/how long): Unknown Negative Consequences of Use: Personal relationships Substance #1 Name of Substance 1: alcohol  1 - Age of First Use: 55 yrs old  1 - Amount (size/oz): "4-5 airplane bottles of Vodka" 1 - Frequency: daily  1 - Duration: "Many yrs" 1 - Last Use / Amount: 11/01/15, 3 airplane bottles of gin.  CIWA: CIWA-Ar BP: 133/85 mmHg Pulse Rate: 101 COWS:    PATIENT STRENGTHS: (choose at least two) Ability for insight Average or above average intelligence Capable of independent living Communication skills General fund of knowledge Religious Affiliation Supportive family/friends  Allergies:  Allergies  Allergen Reactions  . Codeine Other (See Comments)    hallucinating  . Morphine And Related     Made head feel funny    Home Medications:  (Not in a hospital admission)  OB/GYN Status:  No LMP recorded. Patient is postmenopausal.  General Assessment Data Location of Assessment: [facility] TTS Assessment: In system Is this a Tele or Face-to-Face Assessment?: Tele Assessment Is this an Initial Assessment or a Re-assessment for this encounter?: Initial Assessment Marital status: Single Maiden name: NA Is patient pregnant?: No Pregnancy Status: No Living Arrangements: Alone Can pt return to current living arrangement?: Yes Admission Status: Involuntary Is patient capable of signing voluntary admission?: Yes Referral Source:  Self/Family/Friend Insurance type: Medicaid     Crisis Care Plan Living Arrangements: Alone Legal Guardian: Other: (None) Name of Psychiatrist: None Name of Therapist: none  Education Status Is patient currently in school?: No Current Grade: NA Highest grade of school patient has completed: NA Name of school: NA Contact person: NA  Risk to self with the past 6 months Suicidal Ideation: No Has patient been a risk to self within the past 6 months prior to admission? : No Suicidal Intent: No Has patient had any suicidal intent within the past 6 months prior to admission? : No Is patient at risk for suicide?: No Suicidal Plan?: No Has patient had any suicidal plan within the past 6 months prior to admission? : No Access to Means: No What has been your use of drugs/alcohol within the last 12 months?: Pt is abusing alcohol Previous Attempts/Gestures: No How many times?: 0 Other Self Harm Risks: None identified Triggers for Past Attempts: None known Intentional Self Injurious Behavior: None Family Suicide History: No Recent stressful life event(s): Loss (Comment), Financial Problems, Other (Comment) (Boyfriend died in 17-Sep-2022) Persecutory voices/beliefs?: No Depression: Yes Depression Symptoms: Despondent, Tearfulness, Fatigue Substance abuse history and/or treatment for substance abuse?: No Suicide prevention  information given to non-admitted patients: Not applicable  Risk to Others within the past 6 months Homicidal Ideation: No Does patient have any lifetime risk of violence toward others beyond the six months prior to admission? : No Thoughts of Harm to Others: No Current Homicidal Intent: No Current Homicidal Plan: No Access to Homicidal Means: No Identified Victim: None History of harm to others?: No Assessment of Violence: None Noted Violent Behavior Description: Pt denies history of violence Does patient have access to weapons?: No Criminal Charges Pending?:  No Does patient have a court date: No Is patient on probation?: No  Psychosis Hallucinations: None noted Delusions: None noted  Mental Status Report Appearance/Hygiene: In hospital gown Eye Contact: Good Motor Activity: Unremarkable Speech: Logical/coherent Level of Consciousness: Alert, Crying Mood: Depressed, Sad Affect: Depressed Anxiety Level: None Thought Processes: Coherent, Relevant Judgement: Partial Orientation: Person, Place, Time, Situation Obsessive Compulsive Thoughts/Behaviors: None  Cognitive Functioning Concentration: Decreased Memory: Recent Intact, Remote Intact IQ: Average Insight: Fair Impulse Control: Fair Appetite: Poor Weight Loss: 10 Weight Gain: 0 Sleep: Decreased Total Hours of Sleep: 5 Vegetative Symptoms: None  ADLScreening [facility]) Patient's cognitive ability adequate to safely complete daily activities?: Yes Patient able to express need for assistance with ADLs?: Yes Independently performs ADLs?: Yes (appropriate for developmental age)  Prior Inpatient Therapy Prior Inpatient Therapy: No Prior Therapy Dates: NA Prior Therapy Facilty/Provider(s): NA Reason for Treatment: NA  Prior Outpatient Therapy Prior Outpatient Therapy: No Prior Therapy Dates: NA Prior Therapy Facilty/Provider(s): NA Reason for Treatment: NA Does patient have an ACCT team?: No Does patient have Intensive In-House Services?  : No Does patient have [facility] services? : No Does patient have P4CC services?: No  ADL Screening (condition at time of admission) Patient's cognitive ability adequate to safely complete daily activities?: Yes Is the patient deaf or have difficulty hearing?: No Does the patient have difficulty seeing, even when wearing glasses/contacts?: No Does the patient have difficulty concentrating, remembering, or making decisions?: No Patient able to express need for assistance with ADLs?: Yes Does the patient have difficulty  dressing or bathing?: No Independently performs ADLs?: Yes (appropriate for developmental age) Communication: Independent Dressing (OT): Independent Grooming: Independent Feeding: Independent Bathing: Independent Toileting: Independent In/Out Bed: Independent Walks in Home: Independent Does the patient have difficulty walking or climbing stairs?: No  Home Assistive Devices/Equipment Home Assistive Devices/Equipment: Cane (specify quad or straight), Schweickert (specify type)    Abuse/Neglect Assessment (Assessment to be complete while patient is alone) Physical Abuse: Denies Verbal Abuse: Denies Sexual Abuse: Denies Exploitation of patient/patient's resources: Denies Self-Neglect: Denies     Regulatory affairs officer (For Healthcare) Does patient have an advance directive?: No Would patient like information on creating an advanced directive?: No - patient declined information    Additional Information 1:1 In Past 12 Months?: No CIRT Risk: No Elopement Risk: No Does patient have medical clearance?: No     Disposition: Lavell Luster, AC at [facility], confirms adult unit is at capacity. Gave clinical report to Serena Colonel, NP who said Pt meets criteria for inpatient dual-diagnosis treatment. TTS will contact other facilities for placement after Pt is medically cleared. Notified Dr. Lajean Saver of recommendation.  Disposition Initial Assessment Completed for this Encounter: Yes Disposition of Patient: Other dispositions Other disposition(s): Other (Comment)   Evelena Peat, [facility], [facility], [facility] Triage Specialist [phone]   Anson Fret, Orpah Greek 11/01/2015 10:22 PM

## 2015-11-01 NOTE — ED Notes (Signed)
Critical ETOH 332  Primary RN notified  EDP notified

## 2015-11-01 NOTE — ED Notes (Addendum)
Pt transported by [employer] under IVC by Son Jannifer Rodney) for SI, Hallucinations, poor self care and possible polysubstance abuse.  Pt denies SI, denies hallucinations. Pt does admit to drinking etoh today, death of husband in 09/14/2022. Denies drug use. Pt states her son is upset at her because she refuses to babysit grandkids.

## 2015-11-01 NOTE — ED Notes (Signed)
Staffing notified of sitter need 

## 2015-11-01 NOTE — ED Provider Notes (Addendum)
CSN: [id number]     Arrival date & time 11/01/15  1949 History   First MD Initiated Contact with Patient 11/01/15 2014     Chief Complaint  Patient presents with  . Suicidal     (Consider location/radiation/quality/duration/timing/severity/associated sxs/prior Treatment) The history is provided by the patient.  Patient presents indicating depressed since death of friend 2 months ago. Family notes patient with auditory hallucinations, and etoh/substance abuse. Patient arrives with ivc, family indicating patient has voiced suicidal ideation.  Pt indicates her son is just upset because she wont babysit kids.  Pt does admit to feeling depressed.  Pt denies SI currently.   Pt denies hallucinations. Pt states health at baseline. Denies recent illness or fevers.  Pt indicates poor appetite. Pt indicates heavy etoh abuse. Denies hx complicated etoh withdrawal, dts or seizures.       Past Medical History  Diagnosis Date  . Abnormal uterine bleeding   . Depression   . Arthritis   . Rectal bleeding     minor   . Headache(784.0)   . Macrocytic anemia   . PUD (peptic ulcer disease)   . Diverticulosis   . Internal hemorrhoids   . Mallory-Weiss tear   . Alcoholism ([place])   . Chronic pain syndrome   . Hyperplastic colon polyp   . LFT elevation   . Hypopotassemia   . Potassium (K) deficiency   . Hiatal hernia    Past Surgical History  Procedure Laterality Date  . Appendectomy    . Oophorectomy    . Shoulder surgery Left   . Colonoscopy    . 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 Orif periprosthetic fracture Right 05/28/2013    Procedure: OPEN REDUCTION INTERNAL FIXATION (ORIF) PERIPROSTHETIC FRACTURE;  Surgeon: Mauri Pole, MD;  Location[facility];  Service: Orthopedics;  Laterality: Right;  . Esophagogastroduodenoscopy N/A 02/06/2014     Procedure: ESOPHAGOGASTRODUODENOSCOPY (EGD);  Surgeon: Milus Banister, MD;  Location: [facility];  Service: Endoscopy;  Laterality: N/A;  . Total hip arthroplasty      left hip   Family History  Problem Relation Age of Onset  . Diabetes Mother   . Kidney disease Mother   . Heart disease Father   . Colon cancer Father     questionable  . Breast cancer Sister    Social History  Substance Use Topics  . Smoking status: Current Every Day Smoker -- 0.50 packs/day for 25 years    Types: Cigarettes  . Smokeless tobacco: Never Used     Comment: Tobacco info given to pt. 08/16/12  . Alcohol Use: 1.2 - 1.8 oz/week    2-3 Shots of liquor per week     Comment: pt quit drinking 2 months ago (today is 03/11/15)   OB History    Gravida Para Term Preterm AB TAB SAB Ectopic Multiple Living   2 1 1  1  1   1      Review of Systems  Constitutional: Negative for fever and chills.  HENT: Negative for sore throat.   Eyes: Negative for redness.  Respiratory: Negative for shortness of breath.   Cardiovascular: Negative for chest pain.  Gastrointestinal: Negative for vomiting, abdominal pain and diarrhea.  Genitourinary: Negative for flank pain.  Musculoskeletal: Negative for back pain and neck pain.  Skin: Negative for rash.  Neurological:  Negative for headaches.  Hematological: Does not bruise/bleed easily.  Psychiatric/Behavioral: Positive for dysphoric mood.      Allergies  Codeine and Morphine and related  Home Medications   Prior to Admission medications   Medication Sig Start Date End Date Taking? Authorizing Provider  aspirin 325 MG tablet Take 325 mg by mouth daily as needed for mild pain or moderate pain.    Historical Provider, MD  Coenzyme Q10 (CO Q 10 PO) Take 5 mg by mouth daily.     Historical Provider, MD  furosemide (LASIX) 20 MG tablet Take 1 tablet (20 mg total) by mouth 2 (two) times daily. Patient not taking: Reported on 10/02/2015 03/17/15   Boykin Nearing, MD   ibuprofen (ADVIL,MOTRIN) 800 MG tablet Take 800 mg by mouth every 8 (eight) hours as needed for headache, mild pain or moderate pain.    Historical Provider, MD  pantoprazole (PROTONIX) 40 MG tablet Take 1 tablet (40 mg total) by mouth daily. Patient not taking: Reported on 10/02/2015 07/08/15   Jerene Bears, MD  polyethylene glycol powder (GLYCOLAX/MIRALAX) powder Take 1 capful (17 grams) dissolved in at least 8 ounces water/juice once daily. Patient not taking: Reported on 10/02/2015 07/08/15   Jerene Bears, MD  ranitidine (ZANTAC) 75 MG tablet Take 1 tablet (75 mg total) by mouth every evening. As needed Patient not taking: Reported on 10/02/2015 07/08/15   Jerene Bears, MD  spironolactone (ALDACTONE) 25 MG tablet Take 1 tablet (25 mg total) by mouth 2 (two) times daily. Patient not taking: Reported on 10/02/2015 03/17/15   Boykin Nearing, MD  thiamine (VITAMIN B-1) 100 MG tablet Take 100 mg by mouth daily.    Historical Provider, MD  vitamin E 400 UNIT capsule Take 400 Units by mouth daily.    Historical Provider, MD   BP 98/66 mmHg  Pulse 115  Temp(Src) 98.2 F (36.8 C) (Oral)  Resp 20  SpO2 91% Physical Exam  Constitutional: She appears well-developed and well-nourished.  Appears intoxicated.   HENT:  Head: Atraumatic.  Mouth/Throat: Oropharynx is clear and moist.  Eyes: Conjunctivae are normal. Pupils are equal, round, and reactive to light. No scleral icterus.  Neck: Neck supple. No tracheal deviation present.  Cardiovascular: Normal rate, regular rhythm, normal heart sounds and intact distal pulses.   Pulmonary/Chest: Effort normal and breath sounds normal. No respiratory distress.  Abdominal: Soft. Normal appearance. She exhibits no distension. There is no tenderness.  Musculoskeletal: She exhibits no edema.  Neurological: She is alert.  Speech fluent. Steady gait.   Skin: Skin is warm and dry. No rash noted.  Psychiatric:  Intoxicated. Depressed mood.   Nursing note and vitals  reviewed.   ED Course  Procedures (including critical care time) Labs Review  Results for orders placed or performed during the hospital encounter of 11/01/15  Comprehensive metabolic panel  Result Value Ref Range   Sodium 151 (H) 135 - 145 mmol/L   Potassium 2.5 (LL) 3.5 - 5.1 mmol/L   Chloride 107 101 - 111 mmol/L   CO2 29 22 - 32 mmol/L   Glucose, Bld 91 65 - 99 mg/dL   BUN 8 6 - 20 mg/dL   Creatinine, Ser 0.44 0.44 - 1.00 mg/dL   Calcium 9.0 8.9 - 10.3 mg/dL   Total Protein 7.2 6.5 - 8.1 g/dL   Albumin 3.9 3.5 - 5.0 g/dL   AST 172 (H) 15 - 41 U/L   ALT 54 14 - 54 U/L   Alkaline  Phosphatase 143 (H) 38 - 126 U/L   Total Bilirubin 0.5 0.3 - 1.2 mg/dL   GFR calc non Af Amer >60 >60 mL/min   GFR calc Af Amer >60 >60 mL/min   Anion gap 15 5 - 15  CBC  Result Value Ref Range   WBC 9.0 4.0 - 10.5 K/uL   RBC 3.53 (L) 3.87 - 5.11 MIL/uL   Hemoglobin 10.7 (L) 12.0 - 15.0 g/dL   HCT 33.3 (L) 36.0 - 46.0 %   MCV 94.3 78.0 - 100.0 fL   MCH 30.3 26.0 - 34.0 pg   MCHC 32.1 30.0 - 36.0 g/dL   RDW 15.9 (H) 11.5 - 15.5 %   Platelets 284 150 - 400 K/uL       I have personally reviewed and evaluated these images and lab results as part of my medical decision-making.    MDM   Labs sent.  Reviewed nursing notes and prior charts for additional history.   Behavioral health team consulted.  k very low. Monitor. Ecg.  Mg added to labs.  kcl 10 meq iv, 2 runs total. kcl po.  Will plan recheck k.   Disposition per bhh team.   [facility] indicates they are working on inpatient psych placement.   Repeat bmet/k for first thing in AM - signed out plan to Dr Christy Gentles.      Lajean Saver, MD 11/01/15 [phone]

## 2015-11-02 DIAGNOSIS — F333 Major depressive disorder, recurrent, severe with psychotic symptoms: Secondary | ICD-10-CM | POA: Diagnosis not present

## 2015-11-02 DIAGNOSIS — R45851 Suicidal ideations: Secondary | ICD-10-CM | POA: Diagnosis not present

## 2015-11-02 DIAGNOSIS — F1020 Alcohol dependence, uncomplicated: Secondary | ICD-10-CM | POA: Diagnosis not present

## 2015-11-02 DIAGNOSIS — F1094 Alcohol use, unspecified with alcohol-induced mood disorder: Secondary | ICD-10-CM | POA: Diagnosis not present

## 2015-11-02 LAB — BASIC METABOLIC PANEL
ANION GAP: 9 (ref 5–15)
Anion gap: 12 (ref 5–15)
BUN: 5 mg/dL — AB (ref 6–20)
BUN: 5 mg/dL — AB (ref 6–20)
CALCIUM: 8.4 mg/dL — AB (ref 8.9–10.3)
CALCIUM: 8.1 mg/dL — AB (ref 8.9–10.3)
CO2: 28 mmol/L (ref 22–32)
CO2: 26 mmol/L (ref 22–32)
Chloride: 103 mmol/L (ref 101–111)
Chloride: 106 mmol/L (ref 101–111)
Creatinine, Ser: 0.49 mg/dL (ref 0.44–1.00)
GFR calc Af Amer: >60 mL/min (ref >60)
GLUCOSE: 77 mg/dL (ref 65–99)
GLUCOSE: 118 mg/dL — AB (ref 65–99)
Potassium: 2.6 mmol/L — CL (ref 3.5–5.1)
Potassium: 3.7 mmol/L (ref 3.5–5.1)
Sodium: 143 mmol/L (ref 135–145)
Sodium: 141 mmol/L (ref 135–145)

## 2015-11-02 LAB — RAPID URINE DRUG SCREEN, HOSP PERFORMED
AMPHETAMINES: NOT DETECTED
BARBITURATES: NOT DETECTED
BENZODIAZEPINES: NOT DETECTED
COCAINE: NOT DETECTED
Opiates: NOT DETECTED
Tetrahydrocannabinol: NOT DETECTED

## 2015-11-02 LAB — MAGNESIUM: Magnesium: 1.5 mg/dL — ABNORMAL LOW (ref 1.7–2.4)

## 2015-11-02 MED ORDER — MAGNESIUM SULFATE 50 % IJ SOLN: 2.0000 g | Freq: Once | INTRAMUSCULAR | Status: DC

## 2015-11-02 MED ORDER — ALUM & MAG HYDROXIDE-SIMETH 200-200-20 MG/5ML PO SUSP
15.0000 mL | Freq: Four times a day (QID) | ORAL | Status: DC | PRN
  Administered 2015-11-03: 15 mL via ORAL
  Filled 2015-11-02 (×2): qty 30

## 2015-11-02 MED ORDER — VITAMIN B-1 100 MG PO TABS
100.0000 mg | ORAL_TABLET | Freq: Every day | ORAL | Status: DC
  Administered 2015-11-02 – 2015-11-03 (×2): 100 mg via ORAL
  Filled 2015-11-02 (×2): qty 1

## 2015-11-02 MED ORDER — ACETAMINOPHEN 500 MG PO TABS
1000.0000 mg | ORAL_TABLET | Freq: Once | ORAL | Status: AC
  Administered 2015-11-02: 1000 mg via ORAL
  Filled 2015-11-02: qty 2

## 2015-11-02 MED ORDER — ACETAMINOPHEN 325 MG PO TABS: 650.0000 mg | ORAL_TABLET | ORAL | Status: DC | PRN

## 2015-11-02 MED ORDER — NICOTINE 14 MG/24HR TD PT24
14.0000 mg | MEDICATED_PATCH | Freq: Once | TRANSDERMAL | Status: AC
  Administered 2015-11-02: 14 mg via TRANSDERMAL
  Filled 2015-11-02: qty 1

## 2015-11-02 MED ORDER — MAGNESIUM SULFATE 2 GM/50ML IV SOLN
2.0000 g | Freq: Once | INTRAVENOUS | Status: AC
Start: 2015-11-02 — End: 2015-11-02
  Administered 2015-11-02: 2 g via INTRAVENOUS
  Filled 2015-11-02: qty 50

## 2015-11-02 MED ORDER — POTASSIUM CHLORIDE 10 MEQ/100ML IV SOLN
10.0000 meq | INTRAVENOUS | Status: AC
  Administered 2015-11-02 (×3): 10 meq via INTRAVENOUS
  Filled 2015-11-02 (×3): qty 100

## 2015-11-02 MED ORDER — POTASSIUM CHLORIDE CRYS ER 20 MEQ PO TBCR
40.0000 meq | EXTENDED_RELEASE_TABLET | ORAL | Status: AC
  Administered 2015-11-02 (×2): 40 meq via ORAL
  Filled 2015-11-02 (×2): qty 2

## 2015-11-02 MED ORDER — IBUPROFEN 800 MG PO TABS
800.0000 mg | ORAL_TABLET | Freq: Three times a day (TID) | ORAL | Status: DC | PRN
Start: 2015-11-02 — End: 2015-11-03
  Filled 2015-11-02: qty 1

## 2015-11-02 MED ORDER — ALUM & MAG HYDROXIDE-SIMETH 200-200-20 MG/5ML PO SUSP
30.0000 mL | Freq: Once | ORAL | Status: AC
  Administered 2015-11-02: 30 mL via ORAL
  Filled 2015-11-02: qty 30

## 2015-11-02 MED ORDER — POTASSIUM CHLORIDE CRYS ER 20 MEQ PO TBCR
40.0000 meq | EXTENDED_RELEASE_TABLET | Freq: Three times a day (TID) | ORAL | Status: DC
  Administered 2015-11-02: 40 meq via ORAL
  Filled 2015-11-02 (×2): qty 2

## 2015-11-02 NOTE — ED Notes (Signed)
Awake. Verbally responsive. Resp even and unlabored. ABC's intact. No behavior problems noted. NAD noted. Sitter at bedside. 

## 2015-11-02 NOTE — Clinical Social Work Note (Signed)
CSW provided first examination paperwork per psychiatry request to uphold pt IVC status.    Dede Query, LCSW [employer] Worker - Weekend Coverage cell #: [phone]

## 2015-11-02 NOTE — BH Assessment (Signed)
TC to pt's son who took out IVC, Yoltzin Starkey [phone]. Son reports pt has lived with her past 2 weeks. He says she has been an alcoholic for at least 32 yrs. He says she hasn't been eating and has been having bowel movements on herself. Linton Rump says pt says, "I'd be better off killing myself." He reports pt has cirrhosis of the liver and recently had hip surgery. Linton Rump says pt has recently been seeing pt's dead husband.  Arnold Long, [place] Therapeutic Triage Specialist
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
## 2015-11-02 NOTE — ED Notes (Signed)
The lab calls K+ of 2.6 of which I relay to [place], RN her primary care nurse.

## 2015-11-02 NOTE — ED Notes (Signed)
Psych, NP at bedside.

## 2015-11-02 NOTE — ED Notes (Signed)
Bed: [id number] Expected date:  Expected time:  Means of arrival:  Comments: Hold for room 2

## 2015-11-02 NOTE — ED Notes (Signed)
Resting quietly with eye closed. Easily arousable. Verbally responsive. Resp even and unlabored. ABC's intact. No behavior problems noted. NAD noted. Sitter at bedside.  
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
## 2015-11-02 NOTE — Progress Notes (Signed)
Disposition CSW completed patient referrals to the following inpatient psych facilities:  [facility]  CSW will continue to follow patient for placement needs.  [facility] Disposition CSW [phone]

## 2015-11-03 ENCOUNTER — Ambulatory Visit: Payer: Medicaid Other | Admitting: Internal Medicine

## 2015-11-03 ENCOUNTER — Encounter (HOSPITAL_COMMUNITY): Payer: Self-pay

## 2015-11-03 ENCOUNTER — Inpatient Hospital Stay (HOSPITAL_COMMUNITY)
Admission: AD | Admit: 2015-11-03 | Discharge: 2015-11-07 | DRG: 897 | Disposition: A | Discharge status: Home / Self Care | Payer: Medicaid Other | Attending: Psychiatry | Admitting: Psychiatry

## 2015-11-03 DIAGNOSIS — F1024 Alcohol dependence with alcohol-induced mood disorder: Principal | ICD-10-CM | POA: Diagnosis present

## 2015-11-03 DIAGNOSIS — F329 Major depressive disorder, single episode, unspecified: Secondary | ICD-10-CM | POA: Diagnosis present

## 2015-11-03 DIAGNOSIS — F333 Major depressive disorder, recurrent, severe with psychotic symptoms: Secondary | ICD-10-CM

## 2015-11-03 DIAGNOSIS — F1010 Alcohol abuse, uncomplicated: Secondary | ICD-10-CM | POA: Insufficient documentation

## 2015-11-03 DIAGNOSIS — F10229 Alcohol dependence with intoxication, unspecified: Secondary | ICD-10-CM | POA: Diagnosis present

## 2015-11-03 DIAGNOSIS — R45851 Suicidal ideations: Secondary | ICD-10-CM | POA: Diagnosis present

## 2015-11-03 DIAGNOSIS — F1094 Alcohol use, unspecified with alcohol-induced mood disorder: Secondary | ICD-10-CM | POA: Diagnosis present

## 2015-11-03 DIAGNOSIS — Y908 Blood alcohol level of 240 mg/100 ml or more: Secondary | ICD-10-CM | POA: Diagnosis present

## 2015-11-03 DIAGNOSIS — K209 Esophagitis, unspecified without bleeding: Secondary | ICD-10-CM

## 2015-11-03 DIAGNOSIS — F17210 Nicotine dependence, cigarettes, uncomplicated: Secondary | ICD-10-CM | POA: Diagnosis present

## 2015-11-03 DIAGNOSIS — F1020 Alcohol dependence, uncomplicated: Secondary | ICD-10-CM | POA: Diagnosis present

## 2015-11-03 DIAGNOSIS — F1994 Other psychoactive substance use, unspecified with psychoactive substance-induced mood disorder: Secondary | ICD-10-CM | POA: Diagnosis present

## 2015-11-03 LAB — BASIC METABOLIC PANEL
Anion gap: 9 (ref 5–15)
CHLORIDE: 107 mmol/L (ref 101–111)
CO2: 23 mmol/L (ref 22–32)
CREATININE: 0.46 mg/dL (ref 0.44–1.00)
Calcium: 8.8 mg/dL — ABNORMAL LOW (ref 8.9–10.3)
GFR calc Af Amer: >60 mL/min (ref >60)
GFR calc non Af Amer: >60 mL/min (ref >60)
Glucose, Bld: 149 mg/dL — ABNORMAL HIGH (ref 65–99)
POTASSIUM: 3.8 mmol/L (ref 3.5–5.1)
Sodium: 139 mmol/L (ref 135–145)

## 2015-11-03 MED ORDER — VITAMIN E 180 MG (400 UNIT) PO CAPS
400.0000 [IU] | ORAL_CAPSULE | Freq: Every day | ORAL | Status: DC
  Administered 2015-11-04 – 2015-11-07 (×4): 400 [IU] via ORAL
  Filled 2015-11-03 (×5): qty 1

## 2015-11-03 MED ORDER — IBUPROFEN 800 MG PO TABS
800.0000 mg | ORAL_TABLET | Freq: Three times a day (TID) | ORAL | Status: DC | PRN
  Administered 2015-11-03 – 2015-11-07 (×8): 800 mg via ORAL
  Filled 2015-11-03 (×9): qty 1

## 2015-11-03 MED ORDER — LORAZEPAM 1 MG PO TABS: 0.0000 mg | ORAL_TABLET | Freq: Four times a day (QID) | ORAL | Status: DC

## 2015-11-03 MED ORDER — MAGNESIUM HYDROXIDE 400 MG/5ML PO SUSP: 30.0000 mL | Freq: Every day | ORAL | Status: DC | PRN

## 2015-11-03 MED ORDER — GI COCKTAIL ~~LOC~~
30.0000 mL | Freq: Once | ORAL | Status: AC
  Administered 2015-11-03: 30 mL via ORAL
  Filled 2015-11-03: qty 30

## 2015-11-03 MED ORDER — ADULT MULTIVITAMIN W/MINERALS CH
1.0000 | ORAL_TABLET | Freq: Every day | ORAL | Status: DC
  Administered 2015-11-04 – 2015-11-07 (×4): 1 via ORAL
  Filled 2015-11-03 (×5): qty 1

## 2015-11-03 MED ORDER — NICOTINE 14 MG/24HR TD PT24
14.0000 mg | MEDICATED_PATCH | Freq: Once | TRANSDERMAL | Status: DC
  Administered 2015-11-03: 14 mg via TRANSDERMAL

## 2015-11-03 MED ORDER — ONDANSETRON 4 MG PO TBDP: 4.0000 mg | ORAL_TABLET | Freq: Four times a day (QID) | ORAL | Status: DC | PRN

## 2015-11-03 MED ORDER — IBUPROFEN 600 MG PO TABS
600.0000 mg | ORAL_TABLET | Freq: Four times a day (QID) | ORAL | Status: DC | PRN
Start: 2015-11-03 — End: 2015-11-03

## 2015-11-03 MED ORDER — FLUOXETINE HCL 10 MG PO CAPS
10.0000 mg | ORAL_CAPSULE | Freq: Every day | ORAL | Status: DC
  Filled 2015-11-03: qty 1

## 2015-11-03 MED ORDER — IBUPROFEN 200 MG PO TABS
600.0000 mg | ORAL_TABLET | Freq: Four times a day (QID) | ORAL | Status: DC | PRN
  Administered 2015-11-03: 600 mg via ORAL
  Filled 2015-11-03: qty 3

## 2015-11-03 MED ORDER — TRAZODONE HCL 50 MG PO TABS
50.0000 mg | ORAL_TABLET | Freq: Every evening | ORAL | Status: DC | PRN
  Administered 2015-11-03 – 2015-11-06 (×2): 50 mg via ORAL
  Filled 2015-11-03 (×10): qty 1

## 2015-11-03 MED ORDER — ASPIRIN EC 81 MG PO TBEC
81.0000 mg | DELAYED_RELEASE_TABLET | Freq: Every day | ORAL | Status: DC | PRN
  Administered 2015-11-05: 81 mg via ORAL
  Filled 2015-11-03: qty 1

## 2015-11-03 MED ORDER — NICOTINE 21 MG/24HR TD PT24
21.0000 mg | MEDICATED_PATCH | Freq: Every day | TRANSDERMAL | Status: DC
  Administered 2015-11-04 – 2015-11-05 (×2): 21 mg via TRANSDERMAL
  Filled 2015-11-03 (×4): qty 1

## 2015-11-03 MED ORDER — ALUM & MAG HYDROXIDE-SIMETH 200-200-20 MG/5ML PO SUSP: 15.0000 mL | Freq: Four times a day (QID) | ORAL | Status: DC | PRN

## 2015-11-03 MED ORDER — ALUM & MAG HYDROXIDE-SIMETH 200-200-20 MG/5ML PO SUSP: 30.0000 mL | ORAL | Status: DC | PRN

## 2015-11-03 MED ORDER — ACETAMINOPHEN 325 MG PO TABS: 650.0000 mg | ORAL_TABLET | Freq: Four times a day (QID) | ORAL | Status: DC | PRN

## 2015-11-03 MED ORDER — LORAZEPAM 1 MG PO TABS
1.0000 mg | ORAL_TABLET | Freq: Four times a day (QID) | ORAL | Status: DC | PRN
  Administered 2015-11-03 – 2015-11-04 (×2): 1 mg via ORAL
  Filled 2015-11-03 (×2): qty 1

## 2015-11-03 MED ORDER — FLUOXETINE HCL 10 MG PO CAPS
10.0000 mg | ORAL_CAPSULE | Freq: Every day | ORAL | Status: DC
Start: 2015-11-03 — End: 2015-11-03
  Administered 2015-11-03: 10 mg via ORAL
  Filled 2015-11-03: qty 1

## 2015-11-03 MED ORDER — LOPERAMIDE HCL 2 MG PO CAPS: 2.0000 mg | ORAL_CAPSULE | ORAL | Status: DC | PRN

## 2015-11-03 MED ORDER — HYDROXYZINE HCL 25 MG PO TABS: 25.0000 mg | ORAL_TABLET | Freq: Four times a day (QID) | ORAL | Status: DC | PRN

## 2015-11-03 MED ORDER — POTASSIUM CHLORIDE CRYS ER 20 MEQ PO TBCR
20.0000 meq | EXTENDED_RELEASE_TABLET | Freq: Two times a day (BID) | ORAL | Status: DC
  Filled 2015-11-03: qty 1

## 2015-11-03 MED ORDER — VITAMIN B-1 100 MG PO TABS
100.0000 mg | ORAL_TABLET | Freq: Every day | ORAL | Status: DC
  Filled 2015-11-03: qty 1

## 2015-11-03 MED ORDER — NICOTINE 14 MG/24HR TD PT24
MEDICATED_PATCH | TRANSDERMAL | Status: AC
  Administered 2015-11-03: 14 mg via TRANSDERMAL
  Filled 2015-11-03: qty 1

## 2015-11-03 MED ORDER — PANTOPRAZOLE SODIUM 40 MG PO TBEC
40.0000 mg | DELAYED_RELEASE_TABLET | Freq: Every day | ORAL | Status: DC
Start: 2015-11-04 — End: 2015-11-07
  Administered 2015-11-04 – 2015-11-07 (×4): 40 mg via ORAL
  Filled 2015-11-03 (×6): qty 1

## 2015-11-03 MED ORDER — VITAMIN B-1 100 MG PO TABS
100.0000 mg | ORAL_TABLET | Freq: Every day | ORAL | Status: DC
  Administered 2015-11-04: 100 mg via ORAL
  Filled 2015-11-03 (×2): qty 1

## 2015-11-03 MED ORDER — LORAZEPAM 1 MG PO TABS: 0.0000 mg | ORAL_TABLET | Freq: Two times a day (BID) | ORAL | Status: DC

## 2015-11-03 NOTE — Progress Notes (Signed)
Jaime Phillips is 56 year old female being admitted involuntarily to [facility] from [facility].  She was IVC'd by her son for suicidal statements, poor sleep, not taking care of personal hygiene and hallucinations of her son's father.  Her son also reports that she is abusing alcohol and crack cocaine.  Her significant other of 20 plus years passed away 2015-09-22.  She reports multiple stressors including financial and trouble with her son.  She is diagnosed with Alcohol use disorder and Unspecified Depressive disorder.  She denies SI/HI or A/V hallucinations.  She states that she doesn't drink on a normal basis just had a bad weekend.  She reports that she has had hip surgery on her left leg and hip/femur surgery on the left side.  "I have trouble walking at times because my right leg is shorter than the other since the femur surgery."  She denies any major medical problems at this time.  Her BP was elevated and she states that she was going to see her medical doctor tomorrow about the problems.  Admission paperwork completed and signed.  Belongings searched and secured in locker # 10 (sneakers, necklace, sweat shirt with strings, [employer] black phone).  Skin assessment completed and noted surgery scars both hips, femur surgery right leg, left shoulder surgery scar and hysterectomy scar.  No other skin issues noted.  Q 15 minute checks initiated for safety.  We will monitor the progress towards her goals.

## 2015-11-03 NOTE — Progress Notes (Deleted)
1:1 note    Pt reports he is feeling better and his shakes are some improved   He continues to be weak and unsteady and uses a Rebel to ambulate   He remains on a 1:1 for safety   Safety maintained

## 2015-11-03 NOTE — ED Notes (Signed)
Pt transported to [facility] by [facility] for continuation of specialized care. He left in no acute distress. 

## 2015-11-03 NOTE — Tx Team (Signed)
Initial Interdisciplinary Treatment Plan   PATIENT STRESSORS: Loss of significant other of 20 plus years Marital or family conflict Substance abuse   PATIENT STRENGTHS: Active sense of humor Communication skills Motivation for treatment/growth   PROBLEM LIST: Problem List/Patient Goals Date to be addressed Date deferred Reason deferred Estimated date of resolution  Depression 11/03/15     Substance abuse 11/03/15     Anxiety 11/03/15     "I want my health back" 11/03/15     "Maybe try to quit smoking" 11/03/15                              DISCHARGE CRITERIA:  Improved stabilization in mood, thinking, and/or behavior Need for constant or close observation no longer present Verbal commitment to aftercare and medication compliance  PRELIMINARY DISCHARGE PLAN: Outpatient therapy Medication management  PATIENT/FAMIILY INVOLVEMENT: This treatment plan has been presented to and reviewed with the patient, Jaime Phillips.  The patient and family have been given the opportunity to ask questions and make suggestions.  Barbette Or Treasure Ingrum 11/03/2015, 11:08 PM

## 2015-11-03 NOTE — ED Notes (Signed)
[employer] called for patient transport to [facility].

## 2015-11-03 NOTE — BH Assessment (Signed)
[facility] Assessment Progress Note  Per Corena Pilgrim, MD, this pt requires psychiatric hospitalization at this time. Letitia Libra, RN, [facility] has assigned pt to Rm 506-1; pt is not to be transported until 16:00. Pt presents under IVC, and IVC documents have been faxed to [facility]. Pt's nurse has been notified, and agrees to call report to [phone]. Pt is to be transported via [facility].  Jalene Mullet, [place] Triage Specialist [phone]

## 2015-11-03 NOTE — ED Notes (Addendum)
Patient denies SI, HI and AVH at this time. Patient admits to being sad and depressed about admission to [facility]. Plan of care discussed. Patient is calm and cooperative at this time. Encouragement and support provided at this time. Q 15 min safety checks remain in place.

## 2015-11-03 NOTE — ED Notes (Signed)
Psychiatry at bedside.

## 2015-11-03 NOTE — Consult Note (Signed)
[place] Psychiatry Consult   Reason for Consult:  Alcohol dependence/detox Referring Physician:  EDP Patient Identification: Jaime Phillips MRN:  [id number] Principal Diagnosis: Alcohol-induced mood disorder [facility]) Diagnosis:   Patient Active Problem List   Diagnosis Date Noted  . Alcohol-induced mood disorder ([place]) [F10.94] 11/03/2015    Priority: High  . Alcohol use disorder, severe, dependence ([place]) [F10.20] 10/03/2015    Priority: High  . Major psychotic depression, recurrent ([place]) [F33.3] 10/03/2015  . Ankle edema [R60.9] 03/17/2015  . Cirrhosis ([place]) [K74.60] 02/24/2015  . Black stools [K92.1] 02/24/2015  . Abdominal pain, epigastric [R10.13] 01/29/2015  . Nausea with vomiting [R11.2] 01/29/2015  . Ulcerative esophagitis [K22.10] 01/29/2015  . Dysphagia, pharyngoesophageal phase [R13.14] 01/29/2015  . Hypokalemia [E87.6] 12/19/2014  . Hypomagnesemia [E83.42] 12/19/2014  . Malnutrition of moderate degree ([place]) [E44.0] 12/18/2014  . Alcoholic ketoacidosis [F81.8] 12/17/2014  . Alcohol abuse, in remission [F10.10] 02/14/2014  . Essential hypertension, benign [I10] 02/14/2014  . Smoking [Z72.0] 02/14/2014  . Upper GI bleed [K92.2] 02/06/2014  . Chronic pain syndrome [G89.4] 02/06/2014  . Gastro-esophageal reflux [K21.9] 02/06/2014  . Dental caries [K02.9] 01/17/2014  . Periprosthetic fracture of hip ([place]) [id number].[place], [place] 05/31/2013  . Hip fracture ([place]) [S72.009A] 05/27/2013  . Acute blood loss anemia [D62] 04/11/2013  . S/P right THA, AA [Z96.60] 04/10/2013    Total Time spent with patient: 45 minutes  Subjective:   Jaime Phillips is a 55 y.o. female patient admitted with .  HPI: Jaime Phillips is a single 55 y.o. African-American female who presents unaccompanied to [facility] after being petitioned for involuntary commitment by her son, Nkechi Linehan [phone]. [employer] and Petition states: "Danger to self and others. Respondent has unknown  diagnosis and is under doctor's care. According to petitioner, respondent is not sleeping or tending to personal hygiene. Respondent is expressing desire to commit suicide stating "she wants to kill herself." Respondent is apparently hallucinating as well stating she sees the dead father of her son all the time. Also makes attempts to fight her son. Respondent is also abusing alcohol and maybe crack cocaine. Every time petitioner finds his mother she is drunk laying in the yard or on the floor."  Pt acknowledges she is depressed due to multiple stressors. She says her significant other of twenty seven years died 09-23-15 and she is grieving this loss. She is tearful as she explains in detail the circumstances of his death. She states that her son is taking things from her house. She says she cannot afford her residence. Pt reports symptoms including crying spells, loss of interest in usual pleasures, fatigue, irritability, decreased concentration, decreased sleep and feelings of hopelessness. She says her appetites is poor and she has lost approximately ten pounds in the past month. She denies current suicidal ideation or any history of suicide attempts. She denies any intentional self-injurious behavior. Pt says she is unsteady on her feet and uses a cane or [place] because she has history of falls. She denies homicidal ideation or history of violence. She denies any auditory or visual hallucinations.  Today:  Patient endorses depression with suicidal ideations, vague plan.  Multiple stressors  Including the death of her significant other in February.  No hallucinations on assessment or homicidal ideations.    Past Psychiatric History: Alcohol abuse/dependence  Risk to Self: Suicidal Ideation: No Suicidal Intent: No Is patient at risk for suicide?: No Suicidal Plan?: No Access to Means: No What has been your use of drugs/alcohol  within the last 12 months?: Pt is abusing alcohol How many times?:  0 Other Self Harm Risks: None identified Triggers for Past Attempts: None known Intentional Self Injurious Behavior: None Risk to Others: Homicidal Ideation: No Thoughts of Harm to Others: No Current Homicidal Intent: No Current Homicidal Plan: No Access to Homicidal Means: No Identified Victim: None History of harm to others?: No Assessment of Violence: None Noted Violent Behavior Description: Pt denies history of violence Does patient have access to weapons?: No Criminal Charges Pending?: No Does patient have a court date: No Prior Inpatient Therapy: Prior Inpatient Therapy: No Prior Therapy Dates: NA Prior Therapy Facilty/Provider(s): NA Reason for Treatment: NA Prior Outpatient Therapy: Prior Outpatient Therapy: No Prior Therapy Dates: NA Prior Therapy Facilty/Provider(s): NA Reason for Treatment: NA Does patient have an ACCT team?: No Does patient have Intensive In-House Services?  : No Does patient have [facility] services? : No Does patient have P4CC services?: No  Past Medical History:  Past Medical History  Diagnosis Date  . Abnormal uterine bleeding   . Depression   . Arthritis   . Rectal bleeding     minor   . Headache(784.0)   . Macrocytic anemia   . PUD (peptic ulcer disease)   . Diverticulosis   . Internal hemorrhoids   . Mallory-Weiss tear   . Alcoholism ([place])   . Chronic pain syndrome   . Hyperplastic colon polyp   . LFT elevation   . Hypopotassemia   . Potassium (K) deficiency   . Hiatal hernia     Past Surgical History  Procedure Laterality Date  . Appendectomy    . Oophorectomy    . Shoulder surgery Left   . Colonoscopy    . 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 Orif periprosthetic fracture Right 05/28/2013    Procedure: OPEN REDUCTION INTERNAL FIXATION (ORIF) PERIPROSTHETIC FRACTURE;   Surgeon: Mauri Pole, MD;  Location: [facility];  Service: Orthopedics;  Laterality: Right;  . Esophagogastroduodenoscopy N/A 02/06/2014    Procedure: ESOPHAGOGASTRODUODENOSCOPY (EGD);  Surgeon: Milus Banister, MD;  Location: [facility];  Service: Endoscopy;  Laterality: N/A;  . Total hip arthroplasty      left hip   Family History:  Family History  Problem Relation Age of Onset  . Diabetes Mother   . Kidney disease Mother   . Heart disease Father   . Colon cancer Father     questionable  . Breast cancer Sister    Family Psychiatric  History: None Social History:  History  Alcohol Use  . 1.2 - 1.8 oz/week  . 2-3 Shots of liquor per week    Comment: pt quit drinking 2 months ago (today is 03/11/15)     History  Drug Use No    Social History   Social History  . Marital Status: Single    Spouse Name: N/A  . Number of Children: 1  . Years of Education: N/A   Occupational History  . disabled    Social History Main Topics  . Smoking status: Current Every Day Smoker -- 0.50 packs/day for 25 years    Types: Cigarettes  . Smokeless tobacco: Never Used     Comment: Tobacco info given to pt. 08/16/12  . Alcohol Use: 1.2 - 1.8 oz/week    2-3 Shots of liquor per week  Comment: pt quit drinking 2 months ago (today is 03/11/15)  . Drug Use: No  . Sexual Activity: Not Currently   Other Topics Concern  . None   Social History Narrative   Additional Social History:    Allergies:   Allergies  Allergen Reactions  . Codeine Other (See Comments)    hallucinating  . Morphine And Related     Made head feel funny    Labs:  Results for orders placed or performed during the hospital encounter of 11/01/15 (from the past 48 hour(s))  Ethanol (ETOH)     Status: Abnormal   Collection Time: 11/01/15  8:00 PM  Result Value Ref Range   Alcohol, Ethyl (B) 332 (HH) <5 mg/dL    Comment:        LOWEST DETECTABLE LIMIT FOR SERUM ALCOHOL IS 5 mg/dL FOR MEDICAL PURPOSES ONLY CRITICAL  RESULT CALLED TO, READ BACK BY AND VERIFIED WITH: A WARD RN @ 9924 ON 26/83/41 BY C DAVIS   Salicylate level     Status: None   Collection Time: 11/01/15  8:00 PM  Result Value Ref Range   Salicylate Lvl <9.6 2.8 - 30.0 mg/dL  Acetaminophen level     Status: Abnormal   Collection Time: 11/01/15  8:00 PM  Result Value Ref Range   Acetaminophen (Tylenol), Serum <10 (L) 10 - 30 ug/mL    Comment:        THERAPEUTIC CONCENTRATIONS VARY SIGNIFICANTLY. A RANGE OF 10-30 ug/mL MAY BE AN EFFECTIVE CONCENTRATION FOR MANY PATIENTS. HOWEVER, SOME ARE BEST TREATED AT CONCENTRATIONS OUTSIDE THIS RANGE. ACETAMINOPHEN CONCENTRATIONS >150 ug/mL AT 4 HOURS AFTER INGESTION AND >50 ug/mL AT 12 HOURS AFTER INGESTION ARE OFTEN ASSOCIATED WITH TOXIC REACTIONS.   Magnesium     Status: Abnormal   Collection Time: 11/01/15  8:00 PM  Result Value Ref Range   Magnesium 1.6 (L) 1.7 - 2.4 mg/dL  Comprehensive metabolic panel     Status: Abnormal   Collection Time: 11/01/15  8:01 PM  Result Value Ref Range   Sodium 151 (H) 135 - 145 mmol/L   Potassium 2.5 (LL) 3.5 - 5.1 mmol/L    Comment: CRITICAL RESULT CALLED TO, READ BACK BY AND VERIFIED WITH: NELSON,T RN [id number] C[place],N    Chloride 107 101 - 111 mmol/L   CO2 29 22 - 32 mmol/L   Glucose, Bld 91 65 - 99 mg/dL   BUN 8 6 - 20 mg/dL   Creatinine, Ser 0.44 0.44 - 1.00 mg/dL   Calcium 9.0 8.9 - 10.3 mg/dL   Total Protein 7.2 6.5 - 8.1 g/dL   Albumin 3.9 3.5 - 5.0 g/dL   AST 172 (H) 15 - 41 U/L   ALT 54 14 - 54 U/L   Alkaline Phosphatase 143 (H) 38 - 126 U/L   Total Bilirubin 0.5 0.3 - 1.2 mg/dL   GFR calc non Af Amer >60 >60 mL/min   GFR calc Af Amer >60 >60 mL/min    Comment: (NOTE) The eGFR has been calculated using the CKD EPI equation. This calculation has not been validated in all clinical situations. eGFR's persistently <60 mL/min signify possible Chronic Kidney Disease.    Anion gap 15 5 - 15  CBC     Status: Abnormal    Collection Time: 11/01/15  8:01 PM  Result Value Ref Range   WBC 9.0 4.0 - 10.5 K/uL   RBC 3.53 (L) 3.87 - 5.11 MIL/uL   Hemoglobin 10.7 (L) 12.0 - 15.0  g/dL   HCT 33.3 (L) 36.0 - 46.0 %   MCV 94.3 78.0 - 100.0 fL   MCH 30.3 26.0 - 34.0 pg   MCHC 32.1 30.0 - 36.0 g/dL   RDW 15.9 (H) 11.5 - 15.5 %   Platelets 284 150 - 400 K/uL  Urine rapid drug screen (hosp performed) (Not at [facility])     Status: None   Collection Time: 11/02/15  4:47 AM  Result Value Ref Range   Opiates NONE DETECTED NONE DETECTED   Cocaine NONE DETECTED NONE DETECTED   Benzodiazepines NONE DETECTED NONE DETECTED   Amphetamines NONE DETECTED NONE DETECTED   Tetrahydrocannabinol NONE DETECTED NONE DETECTED   Barbiturates NONE DETECTED NONE DETECTED    Comment:        DRUG SCREEN FOR MEDICAL PURPOSES ONLY.  IF CONFIRMATION IS NEEDED FOR ANY PURPOSE, NOTIFY LAB WITHIN 5 DAYS.        LOWEST DETECTABLE LIMITS FOR URINE DRUG SCREEN Drug Class       Cutoff (ng/mL) Amphetamine      1000 Barbiturate      200 Benzodiazepine   132 Tricyclics       440 Opiates          300 Cocaine          300 THC              50   Basic metabolic panel     Status: Abnormal   Collection Time: 11/02/15  7:25 AM  Result Value Ref Range   Sodium 143 135 - 145 mmol/L    Comment: DELTA CHECK NOTED   Potassium 2.6 (LL) 3.5 - 5.1 mmol/L    Comment: CRITICAL RESULT CALLED TO, READ BACK BY AND VERIFIED WITH: T.SMITH RN AT 1027 ON 11/02/15 BY S.VANHOORNE    Chloride 103 101 - 111 mmol/L   CO2 28 22 - 32 mmol/L   Glucose, Bld 77 65 - 99 mg/dL   BUN 5 (L) 6 - 20 mg/dL   Creatinine, Ser <0.30 (L) 0.44 - 1.00 mg/dL   Calcium 8.4 (L) 8.9 - 10.3 mg/dL   GFR calc non Af Amer NOT CALCULATED >60 mL/min   GFR calc Af Amer NOT CALCULATED >60 mL/min    Comment: (NOTE) The eGFR has been calculated using the CKD EPI equation. This calculation has not been validated in all clinical situations. eGFR's persistently <60 mL/min signify possible Chronic  Kidney Disease.    Anion gap 12 5 - 15  Basic metabolic panel     Status: Abnormal   Collection Time: 11/02/15  2:35 PM  Result Value Ref Range   Sodium 141 135 - 145 mmol/L   Potassium 3.7 3.5 - 5.1 mmol/L    Comment: DELTA CHECK NOTED   Chloride 106 101 - 111 mmol/L   CO2 26 22 - 32 mmol/L   Glucose, Bld 118 (H) 65 - 99 mg/dL   BUN 5 (L) 6 - 20 mg/dL   Creatinine, Ser 0.49 0.44 - 1.00 mg/dL   Calcium 8.1 (L) 8.9 - 10.3 mg/dL   GFR calc non Af Amer >60 >60 mL/min   GFR calc Af Amer >60 >60 mL/min    Comment: (NOTE) The eGFR has been calculated using the CKD EPI equation. This calculation has not been validated in all clinical situations. eGFR's persistently <60 mL/min signify possible Chronic Kidney Disease.    Anion gap 9 5 - 15  Magnesium     Status: Abnormal   Collection  Time: 11/02/15  2:35 PM  Result Value Ref Range   Magnesium 1.5 (L) 1.7 - 2.4 mg/dL  Basic metabolic panel     Status: Abnormal   Collection Time: 11/03/15  6:15 AM  Result Value Ref Range   Sodium 139 135 - 145 mmol/L   Potassium 3.8 3.5 - 5.1 mmol/L   Chloride 107 101 - 111 mmol/L   CO2 23 22 - 32 mmol/L   Glucose, Bld 149 (H) 65 - 99 mg/dL   BUN <5 (L) 6 - 20 mg/dL   Creatinine, Ser 0.46 0.44 - 1.00 mg/dL   Calcium 8.8 (L) 8.9 - 10.3 mg/dL   GFR calc non Af Amer >60 >60 mL/min   GFR calc Af Amer >60 >60 mL/min    Comment: (NOTE) The eGFR has been calculated using the CKD EPI equation. This calculation has not been validated in all clinical situations. eGFR's persistently <60 mL/min signify possible Chronic Kidney Disease.    Anion gap 9 5 - 15    Current Facility-Administered Medications  Medication Dose Route Frequency Provider Last Rate Last Dose  . alum & mag hydroxide-simeth (MAALOX/MYLANTA) 200-200-20 MG/5ML suspension 15 mL  15 mL Oral Q6H PRN Gareth Morgan, MD      . FLUoxetine (PROZAC) capsule 10 mg  10 mg Oral Daily Laverne Klugh, MD   10 mg at 11/03/15 1211  . ibuprofen  (ADVIL,MOTRIN) tablet 600 mg  600 mg Oral Q6H PRN Patrecia Pour, NP   600 mg at 11/03/15 1318  . LORazepam (ATIVAN) tablet 0-4 mg  0-4 mg Oral 4 times per day Lajean Saver, MD   1 mg at 11/02/15 1817   Followed by  . [START ON 11/04/2015] LORazepam (ATIVAN) tablet 0-4 mg  0-4 mg Oral Q12H Lajean Saver, MD      . nicotine (NICODERM CQ - dosed in mg/24 hours) patch 14 mg  14 mg Transdermal Once Gareth Morgan, MD   14 mg at 11/02/15 1817  . nicotine (NICODERM CQ - dosed in mg/24 hours) patch 14 mg  14 mg Transdermal Once Lacretia Leigh, MD   14 mg at 11/03/15 0729  . potassium chloride SA (K-DUR,KLOR-CON) CR tablet 20 mEq  20 mEq Oral BID Lajean Saver, MD   20 mEq at 11/03/15 1003  . thiamine (VITAMIN B-1) tablet 100 mg  100 mg Oral Daily Gareth Morgan, MD   100 mg at 11/03/15 1003   Current Outpatient Prescriptions  Medication Sig Dispense Refill  . aspirin EC 81 MG tablet Take 81 mg by mouth daily as needed for mild pain.    . Coenzyme Q10 (CO Q 10 PO) Take 5 mg by mouth daily.     Marland Kitchen ibuprofen (ADVIL,MOTRIN) 800 MG tablet Take 800 mg by mouth every 8 (eight) hours as needed for headache, mild pain or moderate pain.    Marland Kitchen thiamine (VITAMIN B-1) 100 MG tablet Take 100 mg by mouth daily.    . vitamin E 400 UNIT capsule Take 400 Units by mouth daily.    . furosemide (LASIX) 20 MG tablet Take 1 tablet (20 mg total) by mouth 2 (two) times daily. (Patient not taking: Reported on 10/02/2015) 60 tablet 2  . pantoprazole (PROTONIX) 40 MG tablet Take 1 tablet (40 mg total) by mouth daily. (Patient not taking: Reported on 10/02/2015) 90 tablet 1  . polyethylene glycol powder (GLYCOLAX/MIRALAX) powder Take 1 capful (17 grams) dissolved in at least 8 ounces water/juice once daily. (Patient not taking: Reported on  10/02/2015) 1581 g 1  . ranitidine (ZANTAC) 75 MG tablet Take 1 tablet (75 mg total) by mouth every evening. As needed (Patient not taking: Reported on 10/02/2015) 90 tablet 0  . spironolactone (ALDACTONE) 25  MG tablet Take 1 tablet (25 mg total) by mouth 2 (two) times daily. (Patient not taking: Reported on 10/02/2015) 60 tablet 2    Musculoskeletal: Strength & Muscle Tone: within normal limits Gait & Station: normal Patient leans: N/A  Psychiatric Specialty Exam: Review of Systems  Constitutional: Negative.   HENT: Negative.   Eyes: Negative.   Respiratory: Negative.   Cardiovascular: Negative.   Gastrointestinal: Negative.   Genitourinary: Negative.   Musculoskeletal: Negative.   Skin: Negative.   Neurological: Negative.   Endo/Heme/Allergies: Negative.   Psychiatric/Behavioral: Positive for hallucinations and substance abuse.    Blood pressure 149/85, pulse 87, temperature 98.5 F (36.9 C), temperature source Oral, resp. rate 18, SpO2 96 %.There is no weight on file to calculate BMI.  General Appearance: Disheveled  Eye Sport and exercise psychologist::  Fair  Speech:  Normal Rate  Volume:  Normal  Mood:  Depressed  Affect:  Blunt  Thought Process:  Coherent  Orientation:  Full (Time, Place, and Person)  Thought Content:  Rumination  Suicidal Thoughts:  Yes.  with intent/plan  Homicidal Thoughts:  No  Memory:  Immediate;   Fair Recent;   Fair Remote;   Fair  Judgement:  Impaired  Insight:  Lacking  Psychomotor Activity:  Decreased  Concentration:  Fair  Recall:  [employer] of Knowledge:Fair  Language: Fair  Akathisia:  No  Handed:  Right  AIMS (if indicated):     Assets:  Leisure Time Physical Health Resilience  ADL's:  Intact  Cognition: WNL  Sleep:      Treatment Plan Summary: Daily contact with patient to assess and evaluate symptoms and progress in treatment, Medication management and Plan alcohol induced mood disorder:  -Crisis stabilization -Medication management:  CIWA Alcohol Detox Protocol in place along with medical medications  -Individual and substance abuse counseling  Disposition: Recommend psychiatric Inpatient admission when medically cleared.  Waylan Boga,  NP 11/03/2015 5:22 PM Patient seen face-to-face for psychiatric evaluation, chart reviewed and case discussed with the physician extender and developed treatment plan. Reviewed the information documented and agree with the treatment plan. Corena Pilgrim, MD

## 2015-11-03 NOTE — ED Notes (Signed)
Pt reports having heartburn and the maalox given to her did not help.  Dr. Zenia Resides made aware.

## 2015-11-03 NOTE — ED Notes (Signed)
Patient transferred earlier in the shift from [place].  She has been pleasant and cooperative since admission.  Visited with son for a short time.  Son was requesting a note stating he had been at the hospital with his mother stating he needed this for his job.  I explained that we are not able to do this, spoke with Jalene Mullet who verified that we are unable to do this, also spoke with the assistant director who stated that we do not do this.  Explained this to son who demanded to speak with my supervisor.  Otila Kluver, [facility] was on the unit at the time and she went and spoke with him.  Patient complained of a headache earlier in the shift and was given ibuprofen with good relief.

## 2015-11-04 DIAGNOSIS — F1094 Alcohol use, unspecified with alcohol-induced mood disorder: Secondary | ICD-10-CM

## 2015-11-04 LAB — BASIC METABOLIC PANEL
ANION GAP: 7 (ref 5–15)
BUN: 9 mg/dL (ref 6–20)
CALCIUM: 9.4 mg/dL (ref 8.9–10.3)
CO2: 24 mmol/L (ref 22–32)
Chloride: 109 mmol/L (ref 101–111)
Creatinine, Ser: 0.55 mg/dL (ref 0.44–1.00)
Glucose, Bld: 97 mg/dL (ref 65–99)
Potassium: 4.4 mmol/L (ref 3.5–5.1)
Sodium: 140 mmol/L (ref 135–145)

## 2015-11-04 LAB — MAGNESIUM: Magnesium: 1.7 mg/dL (ref 1.7–2.4)

## 2015-11-04 MED ORDER — LORAZEPAM 1 MG PO TABS
1.0000 mg | ORAL_TABLET | Freq: Four times a day (QID) | ORAL | Status: AC
  Administered 2015-11-04 (×2): 1 mg via ORAL
  Filled 2015-11-04 (×2): qty 1

## 2015-11-04 MED ORDER — VITAMIN B-1 100 MG PO TABS
100.0000 mg | ORAL_TABLET | Freq: Every day | ORAL | Status: DC
  Administered 2015-11-05 – 2015-11-07 (×3): 100 mg via ORAL
  Filled 2015-11-04 (×4): qty 1

## 2015-11-04 MED ORDER — HYDROXYZINE HCL 25 MG PO TABS
25.0000 mg | ORAL_TABLET | Freq: Four times a day (QID) | ORAL | Status: AC | PRN
  Administered 2015-11-05: 25 mg via ORAL
  Filled 2015-11-04: qty 1

## 2015-11-04 MED ORDER — ADULT MULTIVITAMIN W/MINERALS CH
1.0000 | ORAL_TABLET | Freq: Every day | ORAL | Status: DC
  Filled 2015-11-04 (×2): qty 1

## 2015-11-04 MED ORDER — LOPERAMIDE HCL 2 MG PO CAPS: 2.0000 mg | ORAL_CAPSULE | ORAL | Status: AC | PRN

## 2015-11-04 MED ORDER — LORAZEPAM 1 MG PO TABS
1.0000 mg | ORAL_TABLET | Freq: Three times a day (TID) | ORAL | Status: AC
  Administered 2015-11-05 (×3): 1 mg via ORAL
  Filled 2015-11-04 (×3): qty 1

## 2015-11-04 MED ORDER — ONDANSETRON 4 MG PO TBDP: 4.0000 mg | ORAL_TABLET | Freq: Four times a day (QID) | ORAL | Status: AC | PRN

## 2015-11-04 MED ORDER — THIAMINE HCL 100 MG/ML IJ SOLN
100.0000 mg | Freq: Once | INTRAMUSCULAR | Status: DC
  Filled 2015-11-04: qty 2

## 2015-11-04 MED ORDER — LORAZEPAM 1 MG PO TABS: 1.0000 mg | ORAL_TABLET | Freq: Four times a day (QID) | ORAL | Status: AC | PRN

## 2015-11-04 MED ORDER — LORAZEPAM 1 MG PO TABS
1.0000 mg | ORAL_TABLET | Freq: Every day | ORAL | Status: AC
  Administered 2015-11-07: 1 mg via ORAL
  Filled 2015-11-04: qty 1

## 2015-11-04 MED ORDER — LORAZEPAM 1 MG PO TABS
1.0000 mg | ORAL_TABLET | Freq: Two times a day (BID) | ORAL | Status: AC
  Administered 2015-11-06 (×2): 1 mg via ORAL
  Filled 2015-11-04 (×2): qty 1

## 2015-11-04 NOTE — Tx Team (Signed)
Interdisciplinary Treatment Plan Update (Adult) Date: 11/04/2015    Time Reviewed: 9:30 AM  Progress in Treatment: Attending groups: Continuing to assess, patient new to milieu Participating in groups: Continuing to assess, patient new to milieu Taking medication as prescribed: Yes Tolerating medication: Yes Family/Significant other contact made: No, CSW assessing for appropriate contacts Patient understands diagnosis: Yes Discussing patient identified problems/goals with staff: Yes Medical problems stabilized or resolved: Yes Denies suicidal/homicidal ideation: Yes Issues/concerns per patient self-inventory: Yes Other:  New problem(s) identified: N/A  Discharge Plan or Barriers: CSW continuing to assess, patient new to milieu.  Reason for Continuation of Hospitalization:  Depression Anxiety Medication Stabilization   Comments: N/A  Estimated length of stay: 3-5 days    Patient is a 55 year old female with a diagnosis of Major Depressive Disorder and Alcohol Use Disorder. Pt presented to the hospital with depression and alcohol abuse. Pt reports primary trigger(s) for admission was the loss of her significant other, financial stressors, and relationship issues with her son. Patient will benefit from crisis stabilization, medication evaluation, group therapy and psycho education in addition to case management for discharge planning. At discharge, it is recommended that Pt remain compliant with established discharge plan and continued treatment.   Review of initial/current patient goals per problem list:  1. Goal(s): Patient will participate in aftercare plan   Met: No   Target date: 3-5 days post admission date   As evidenced by: Patient will participate within aftercare plan AEB aftercare provider and housing plan at discharge being identified.  4/4: Goal not met: CSW assessing for appropriate referrals for pt and will have follow up secured prior to d/c.    2.  Goal (s): Patient will exhibit decreased depressive symptoms and suicidal ideations.   Met: No   Target date: 3-5 days post admission date   As evidenced by: Patient will utilize self rating of depression at 3 or below and demonstrate decreased signs of depression or be deemed stable for discharge by MD.  4/4: Goal not met: Pt presents with flat affect and depressed mood.  Pt admitted with depression rating of 10.  Pt to show decreased sign of depression and a rating of 3 or less before d/c.      4. Goal(s): Patient will demonstrate decreased signs of withdrawal due to substance abuse   Met: No   Target date: 3-5 days post admission date   As evidenced by: Patient will produce a CIWA/COWS score of 0, have stable vitals signs, and no symptoms of withdrawal  4/4: Goal not met: Pt continues to have withdrawal symptoms of anxiety and a CIWA score of a 1.  Pt to show decrease withdrawal symptoms prior to d/c.    5. Goal(s): Patient will demonstrate decreased signs of psychosis  * Met: No  * Target date: 3-5 days post admission date  * As evidenced by: Patient will demonstrate decreased frequency of AVH or return to baseline function  4/4: Goal not met: Pt to take medication as prescribed to decrease psychosis to baseline.    Attendees: Patient:    Family:    Physician: Dr. Parke Poisson; Dr. Sabra Heck 11/04/2015 9:30 AM  Nursing: Grayland Ormond, Mayra Neer, Christa Resa Miner, RN 11/04/2015 9:30 AM  Clinical Social Worker: Erasmo Downer Maribel Hadley, LCSW 11/04/2015 9:30 AM  Other: Peri Maris, LCSWA; [place], LCSW  11/04/2015 9:30 AM  Other: Norberto Sorenson, P4CC 11/04/2015 9:30 AM  Other: Lars Pinks, Case Manager 11/04/2015 9:30 AM  Other: May Augustin,  Agustina Caroli, NP 11/04/2015 9:30 AM  Other:    Other:    Other:    Other:     Scribe for Treatment Team:  Tilden Fossa, [place]
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
## 2015-11-04 NOTE — BHH Group Notes (Signed)
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
## 2015-11-04 NOTE — Progress Notes (Signed)
Pt had a wonderful day even though she was disappointed she was not able to go home.

## 2015-11-04 NOTE — Progress Notes (Signed)
Recreation Therapy Notes  Animal-Assisted Activity (AAA) Program Checklist/Progress Notes Patient Eligibility Criteria Checklist & Daily Group note for Rec Tx Intervention  Date: 04.04.2017 Time: 2:45pm Location: [address]    AAA/T Program Assumption of Risk Form signed by Patient/ or Parent Legal Guardian Yes  Patient is free of allergies or sever asthma Yes  Patient reports no fear of animals Yes  Patient reports no history of cruelty to animals Yes  Patient understands his/her participation is voluntary Yes  Patient washes hands before animal contact Yes  Patient washes hands after animal contact Yes  Behavioral Response: Appropriate   Education: Hand Washing, Appropriate Animal Interaction   Education Outcome: Acknowledges education.   Clinical Observations/Feedback: Patient interacted appropriately with therapy dog and peers during session.    Laureen Ochs Alan Drummer, LRT/CTRS        Torrey Ballinas L 11/04/2015 3:06 PM

## 2015-11-04 NOTE — [facility] Group Notes (Signed)
Patient did not attend group. She was not admit at the time

## 2015-11-04 NOTE — Progress Notes (Addendum)
D:  Patient's self inventory sheet, patient has fair sleep, sleep medication is helpful.  Fair appetite, low energy level, good concentration.  Denied anxiety, hopeless and depression.  Denied withdrawals.  Denied SI.  Denied physical problems.  Denied pain.  Plan is to walk today.  Does have discharge plans. A:  Medications administered per MD orders.  Emotional support and encouragement given patient. R:  Denied SI and HI, contracts for safety.  Denied A/V hallucinations.  Safety maintained with 15 minute checks.

## 2015-11-04 NOTE — Progress Notes (Signed)
D   Pt is pleasant on approach and cooperative   She interacts appropriately with others and attends and participates in group    A    Verbal support given   Medications administered and effectiveness monitored    Q 15 min checks R   Pt is safe at present

## 2015-11-04 NOTE — Plan of Care (Signed)
Problem: Consults Goal: Depression Patient Education See Patient Education Module for education specifics.  Outcome: Progressing Nurse discussed depression/coping skills with patient.        

## 2015-11-04 NOTE — [facility] Group Notes (Signed)
Patient was not admit at the time of group.

## 2015-11-04 NOTE — BHH Group Notes (Signed)
[place] LCSW Group Therapy 11/04/2015  1:15 PM   Type of Therapy: Group Therapy  Participation Level: Did Not Attend as she was meeting with providers.   Tilden Fossa, MSW, [place] Clinical Social Worker [facility] [phone]

## 2015-11-05 MED ORDER — NICOTINE POLACRILEX 2 MG MT GUM
2.0000 mg | CHEWING_GUM | OROMUCOSAL | Status: DC | PRN
  Administered 2015-11-05: 2 mg via ORAL

## 2015-11-05 MED ORDER — GUAIFENESIN 100 MG/5ML PO SOLN
15.0000 mL | Freq: Four times a day (QID) | ORAL | Status: DC | PRN
  Administered 2015-11-05: 300 mg via ORAL
  Filled 2015-11-05: qty 15

## 2015-11-05 NOTE — BHH Group Notes (Signed)
[facility] LCSW Aftercare Discharge Planning Group Note  11/05/2015  8:45 AM  Participation Quality: Did Not Attend. Patient invited to participate but declined.  Tilden Fossa, MSW, [place] Clinical Social Worker [facility] [phone]

## 2015-11-05 NOTE — Progress Notes (Signed)
D:Patient in the hallway in approach.  Patient state she had a positive day today.  Patient states she has been being mindful of being unsteady.  Patient states her goal was to get out of bed and to interact with peers.  Patient visible on the unit.  Patient denies SI/HI and denies AVH.   A: Staff to monitor Q 15 mins for safety.  Encouragement and support offered.  No scheduled medications administered per orders.  R: Patient remains safe on the unit.  Patient attended group tonight.  Patient visible on unit and interacting with peers.  Patient refused bedtime medications tonight.

## 2015-11-05 NOTE — Plan of Care (Signed)
Problem: Consults Goal: Substance Abuse Patient Education See Patient Education Module for education specifics.  Outcome: Progressing Nurse discussed depression/coping skills/substance abuse with patient.

## 2015-11-05 NOTE — Progress Notes (Signed)
Pt attended NA meeting this evening.  
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
## 2015-11-05 NOTE — Progress Notes (Signed)
Recreation Therapy Notes  Date: 04.04.02017 Time: 9:30am Location: [address] Group Room   Group Topic: Stress Management  Goal Area(s) Addresses:  Patient will actively participate in stress management techniques presented during session.   Behavioral Response: Did not attend.   Laureen Ochs Jacquita Mulhearn, LRT/CTRS        [place] Tozer L 11/05/2015 2:19 PM

## 2015-11-05 NOTE — Progress Notes (Signed)
D:  Patient's self inventory sheet, patient has fair sleep, no sleep medication given.  Fair appetite, low energy level, good concentration.  Denied hopeless and anxiety.  Denied withdrawals.  Denied physical problems.  Stated she has experienced pain and dizziness in past 24 hours.  Goal is to keep warm.  No discharge plans. A:  Medications administered per MD orders.  Emotional support and encouragement given patient. R:  Patient denied SI and HI while talking to nurse this morning, contracted for safety.  Denied A/V hallucinations.  Safety maintained with 15 minute checks

## 2015-11-05 NOTE — BHH Suicide Risk Assessment (Signed)
[place] INPATIENT:  Family/Significant Other Suicide Prevention Education  Suicide Prevention Education:  Education Completed; daughter in law Wilhelmenia Blase [phone],  (name of family member/significant other) has been identified by the patient as the family member/significant other with whom the patient will be residing, and identified as the person(s) who will aid the patient in the event of a mental health crisis (suicidal ideations/suicide attempt).  With written consent from the patient, the family member/significant other has been provided the following suicide prevention education, prior to the and/or following the discharge of the patient.  The suicide prevention education provided includes the following:  Suicide risk factors  Suicide prevention and interventions  National Suicide Hotline telephone number  [facility] assessment telephone number  [facility] Emergency Assistance Deal and/or Residential Mobile Crisis Unit telephone number  Request made of family/significant other to:  Remove weapons (e.g., guns, rifles, knives), all items previously/currently identified as safety concern.    Remove drugs/medications (over-the-counter, prescriptions, illicit drugs), all items previously/currently identified as a safety concern.  The family member/significant other verbalizes understanding of the suicide prevention education information provided.  The family member/significant other agrees to remove the items of safety concern listed above.  Jaime Phillips, Casimiro Needle 11/05/2015, 3:33 PM

## 2015-11-06 LAB — GLUCOSE, CAPILLARY: GLUCOSE-CAPILLARY: 122 mg/dL — AB (ref 65–99)

## 2015-11-06 NOTE — Progress Notes (Signed)
[place] Group Notes:  (Nursing/MHT/Case Management/Adjunct)  Date:  11/06/2015  Time:  2100  Type of Therapy:  wrap up group  Participation Level:  Minimal  Participation Quality:  Attentive and Supportive  Affect:  Flat  Cognitive:  Appropriate  Insight:  Lacking  Engagement in Group:  Supportive  Modes of Intervention:  Clarification, Education and Support  Summary of Progress/Problems: Pt reported wanting to discharge tomorrow with plans to go home and check on her house and grandchildren while awaiting a bed at [facility]. When asked what brought her here, pt reports "to get away".   Winfield Rast S 11/06/2015, 10:06 PM

## 2015-11-06 NOTE — Progress Notes (Signed)
DAR NOTE: Patient presents with anxious affect and depressed mood.  Denies  auditory and visual hallucinations.  Rates depression at 0, hopelessness at 0, and anxiety at 0.  Maintained on routine safety checks.  Medications given as prescribed.  Support and encouragement offered as needed.  Attended group and participated.  Patient observed socializing with peers in the dayroom.  Complain of headache and back pain.  Motrin 800 mg given with good effect.

## 2015-11-06 NOTE — BHH Group Notes (Signed)
[place] LCSW Group Therapy  11/06/2015 12:42 PM  Type of Therapy:  Group Therapy  Participation Level:  Active  Participation Quality:  Attentive  Affect:  Appropriate  Cognitive:  Alert and Oriented  Insight:  Improving  Engagement in Therapy:  Improving  Modes of Intervention:  Discussion, Education, Exploration, Rapport Building, Socialization and Support  Summary of Progress/Problems: [name] came to talk about his personal journey with substance abuse and addiction. The pt processed ways by which to relate to the speaker. [name] provided handouts and educational information pertaining to groups and services offered by the [facility].   Smart, Jaime Baldwin LCSW 11/06/2015, 12:42 PM
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
## 2015-11-06 NOTE — BHH Group Notes (Signed)
Late Entry from 11/05/15:  [facility] LCSW Group Therapy 11/05/2015  1:15 PM Type of Therapy: Group Therapy Participation Level: Minimal  Participation Quality: Attentive  Affect: Depressed and Flat  Cognitive: Alert and Oriented  Insight: Developing/Improving and Engaged  Engagement in Therapy: Developing/Improving and Engaged  Modes of Intervention: Clarification, Confrontation, Discussion, Education, Exploration, Limit-setting, Orientation, Problem-solving, Rapport Building, Art therapist, Socialization and Support  Summary of Progress/Problems: The topic for group today was emotional regulation. This group focused on both positive and negative emotion identification and allowed group members to process ways to identify feelings, regulate negative emotions, and find healthy ways to manage internal/external emotions. Group members were asked to reflect on a time when their reaction to an emotion led to a negative outcome and explored how alternative responses using emotion regulation would have benefited them. Group members were also asked to discuss a time when emotion regulation was utilized when a negative emotion was experienced. Patient did not engage in discussion despite CSW encouragement. Patient left group early without returning.  Tilden Fossa, MSW, [place] Clinical Social Worker [facility] [phone]

## 2015-11-06 NOTE — BHH Group Notes (Signed)
[place] Group Notes:  (Nursing/MHT/Case Management/Adjunct)  Date:  11/06/2015  Time:  12:25 PM  Type of Therapy:  Nurse Education  Participation Level:  Did Not Attend    Mart Piggs 11/06/2015, 12:25 PM

## 2015-11-06 NOTE — Progress Notes (Signed)
D: Patient in the hallway on approach.  Patient states she had a good day.  Patient states she is ready for discharge tomorrow.  Patient states her goal for today was to attend all groups.  Patient states she met her goal for today. Patient denies SI/HI and denies AVH. A: Staff to monitor Q 15 mins for safety.  Encouragement and support offered.  Scheduled medications administered per orders.   R: Patient remains safe on the unit.  Patient attended group tonight.  Patient visible on unit and interacting with peers.  Patient taking administered medications.
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
## 2015-11-07 MED ORDER — VITAMIN B-1 100 MG PO TABS: 100.0000 mg | ORAL_TABLET | Freq: Every day | ORAL | Status: DC

## 2015-11-07 MED ORDER — NICOTINE POLACRILEX 2 MG MT GUM: 2.0000 mg | CHEWING_GUM | OROMUCOSAL | Status: DC | PRN

## 2015-11-07 MED ORDER — HYDROXYZINE HCL 25 MG PO TABS: ORAL_TABLET | ORAL | Status: DC

## 2015-11-07 MED ORDER — IBUPROFEN 800 MG PO TABS: 800.0000 mg | ORAL_TABLET | Freq: Three times a day (TID) | ORAL | Status: DC | PRN

## 2015-11-07 MED ORDER — TRAZODONE HCL 100 MG PO TABS: ORAL_TABLET | ORAL | Status: DC

## 2015-11-07 MED ORDER — TRAZODONE HCL 100 MG PO TABS
100.0000 mg | ORAL_TABLET | Freq: Every evening | ORAL | Status: DC | PRN
  Filled 2015-11-07 (×2): qty 1

## 2015-11-07 MED ORDER — PANTOPRAZOLE SODIUM 40 MG PO TBEC: 40.0000 mg | DELAYED_RELEASE_TABLET | Freq: Every day | ORAL | Status: DC

## 2015-11-07 MED ORDER — VITAMIN E 180 MG (400 UNIT) PO CAPS: 400.0000 [IU] | ORAL_CAPSULE | Freq: Every day | ORAL | Status: DC

## 2015-11-07 MED ORDER — ASPIRIN EC 81 MG PO TBEC: 81.0000 mg | DELAYED_RELEASE_TABLET | Freq: Every day | ORAL | Status: DC | PRN

## 2015-11-07 MED ORDER — TRAZODONE HCL 50 MG PO TABS: 50.0000 mg | ORAL_TABLET | Freq: Every evening | ORAL | Status: DC | PRN
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
## 2015-11-07 NOTE — Progress Notes (Signed)
  [facility] Adult Case Management Discharge Plan :  Will you be returning to the same living situation after discharge:  Yes,  home At discharge, do you have transportation home?: Yes,  son and daughter in law Do you have the ability to pay for your medications: Yes,  [facility]  Release of information consent forms completed and submitted by CSW.  Patient to Follow up at: Follow-up Information    Follow up with [facility] On 11/11/2015.   Why:  Screening for possible admission on this date at 8:00AM. Please bring Photo ID/proof of [place] residence/Medicaid card. Thank you.    Contact information:   [address] [place], [place] [address] [phone]      Follow up with [facility].   Why:  Referral made on 4/5. You are currently on waitlist.    Contact information:   [place] [phone]      [facility]   Specialty:  Behavioral Health   Why:  Walk-in clinic Mondays, Wednesdays, and Fridays from 9am to 11:30am for assessment for outpatient therapy and medication management services. Please arrive early and bring your insurance card in order to be seen as quickly as possible.    Contact information:   [place] [address] [phone]       Next level of care provider has access to [place] and Suicide Prevention discussed: Yes,  SPE completed with pt's daughter in law. SPI pamphlet and mobile crisis information provided to pt and she was encouraged to share this information with support network.    Have you used any form of tobacco in the last 30 days? (Cigarettes, Smokeless Tobacco, Cigars, and/or Pipes): Yes  Has patient been referred to the Quitline?: Patient refused referral  Patient has been referred for addiction treatment: Yes-see above.   Smart, Larsen Dungan LCSW 11/07/2015, 9:25 AM

## 2015-11-07 NOTE — Tx Team (Signed)
Interdisciplinary Treatment Plan Update (Adult) Date: 11/07/2015    Time Reviewed: 9:30 AM  Progress in Treatment: Attending groups: Yes Participating in groups: Yes Taking medication as prescribed: Yes Tolerating medication: Yes Family/Significant other contact made: SPE completed with pt's daughter in law. Collateral information also obtained.  Patient understands diagnosis: Yes Discussing patient identified problems/goals with staff: Yes Medical problems stabilized or resolved: Yes Denies suicidal/homicidal ideation: Yes Issues/concerns per patient self-inventory: Yes Other:  New problem(s) identified: N/A  Discharge Plan or Barriers: Pt referred to [facility] (on waitlist) and [facility] (screening on Tuesday at 8am). Pt plans to seek outpatient treatment through [facility] in [place]. She will return home in the meantime and her son/daughter in law will pick her up.   Reason for Continuation of Hospitalization:  none  Comments: N/A  Estimated length of stay: d/c today   Patient is a 55 year old female with a diagnosis of Major Depressive Disorder and Alcohol Use Disorder. Pt presented to the hospital with depression and alcohol abuse. Pt reports primary trigger(s) for admission was the loss of her significant other, financial stressors, and relationship issues with her son. Patient will benefit from crisis stabilization, medication evaluation, group therapy and psycho education in addition to case management for discharge planning. At discharge, it is recommended that Pt remain compliant with established discharge plan and continued treatment.   Review of initial/current patient goals per problem list:  1. Goal(s): Patient will participate in aftercare plan   Met: Yes   Target date: 3-5 days post admission date   As evidenced by: Patient will participate within aftercare plan AEB aftercare provider and housing plan at discharge being identified.  4/4: Goal not met: CSW  assessing for appropriate referrals for pt and will have follow up secured prior to d/c.  4/7: Goal met. Pt to return home; follow-up at [facility]. Pt has [facility] screening Tuesday and is on [facility] waitlist.     2. Goal (s): Patient will exhibit decreased depressive symptoms and suicidal ideations.   Met: Yes   Target date: 3-5 days post admission date   As evidenced by: Patient will utilize self rating of depression at 3 or below and demonstrate decreased signs of depression or be deemed stable for discharge by MD.  4/4: Goal not met: Pt presents with flat affect and depressed mood.  Pt admitted with depression rating of 10.  Pt to show decreased sign of depression and a rating of 3 or less before d/c.    4/7: Goal met. Pt rates depression as 2/10 and presents with pleasant mood/calm affect. Denies SI/HI/AVH.   4. Goal(s): Patient will demonstrate decreased signs of withdrawal due to substance abuse   Met: Yes   Target date: 3-5 days post admission date   As evidenced by: Patient will produce a CIWA/COWS score of 0, have stable vitals signs, and no symptoms of withdrawal  4/4: Goal not met: Pt continues to have withdrawal symptoms of anxiety and a CIWA score of a 1.  Pt to show decrease withdrawal symptoms prior to d/c.   4/7: Goal met. Pt reports no signs of withdrawal with CIWA score of 0 and high pulse. All other vitals are stable. Per MD, pt is medically stable for discharge today.    5. Goal(s): Patient will demonstrate decreased signs of psychosis  * Met: No  * Target date: 3-5 days post admission date  * As evidenced by: Patient will demonstrate decreased frequency of AVH or return to baseline  function  4/4: Goal not met: Pt to take medication as prescribed to decrease psychosis to baseline.    Attendees: Patient:    Family:    Physician: Dr. Sabra Heck 11/07/2015 9:31 AM   Nursing: Jasmine Awe RN 11/07/2015 9:31 AM   Clinical Social Worker: Tilden Fossa, LCSW  11/07/2015 9:31 AM   Other: Peri Maris, LCSWA; Baker, LCSW  11/07/2015 9:31 AM   Other: Norberto Sorenson, P4CC 11/07/2015 9:31 AM   Other: Lars Pinks, Case Manager 11/07/2015 9:31 AM   Other: Larose Kells, NP 11/07/2015 9:31 AM   Other:  11/07/2015 9:31 AM   Other:    Other:    Other:     Scribe for Treatment Team:  Maxie Better, MSW, LCSW Clinical Social Worker 11/07/2015 9:31 AM

## 2015-11-07 NOTE — [facility] Suicide Risk Assessment (Signed)
[facility] Discharge Suicide Risk Assessment   Principal Problem: Alcohol-induced mood disorder [facility]) Discharge Diagnoses:  Patient Active Problem List   Diagnosis Date Noted  . Alcohol-induced mood disorder ([place]) [F10.94] 11/03/2015  . Alcohol abuse [F10.10]   . Alcohol use disorder, severe, dependence ([place]) [F10.20] 10/03/2015  . Major psychotic depression, recurrent ([place]) [F33.3] 10/03/2015  . Ankle edema [R60.9] 03/17/2015  . Cirrhosis ([place]) [K74.60] 02/24/2015  . Black stools [K92.1] 02/24/2015  . Abdominal pain, epigastric [R10.13] 01/29/2015  . Nausea with vomiting [R11.2] 01/29/2015  . Ulcerative esophagitis [K22.10] 01/29/2015  . Dysphagia, pharyngoesophageal phase [R13.14] 01/29/2015  . Hypokalemia [E87.6] 12/19/2014  . Hypomagnesemia [E83.42] 12/19/2014  . Malnutrition of moderate degree ([place]) [E44.0] 12/18/2014  . Alcoholic ketoacidosis [id number] 12/17/2014  . Alcohol abuse, in remission [F10.10] 02/14/2014  . Essential hypertension, benign [I10] 02/14/2014  . Smoking [Z72.0] 02/14/2014  . Upper GI bleed [K92.2] 02/06/2014  . Chronic pain syndrome [G89.4] 02/06/2014  . Gastro-esophageal reflux [K21.9] 02/06/2014  . Dental caries [K02.9] 01/17/2014  . Periprosthetic fracture of hip ([place]) [id number], [place] 05/31/2013  . Hip fracture ([place]) [S72.009A] 05/27/2013  . Acute blood loss anemia [D62] 04/11/2013  . S/P right THA, AA [Z96.60] 04/10/2013    Total Time spent with patient: 20 minutes  Musculoskeletal: Strength & Muscle Tone: within normal limits Gait & Station: normal Patient leans: normal  Psychiatric Specialty Exam: Review of Systems  Constitutional: Negative.   Eyes: Negative.   Respiratory: Negative.   Cardiovascular: Negative.   Gastrointestinal: Positive for heartburn.  Genitourinary: Negative.   Musculoskeletal: Negative.   Skin: Negative.   Neurological: Positive for headaches.  Endo/Heme/Allergies: Negative.   Psychiatric/Behavioral: Positive for  substance abuse.    Blood pressure 139/83, pulse 115, temperature 98.8 F (37.1 C), temperature source Oral, resp. rate 16, height 5\' 3"  (1.6 m), weight 57.607 kg (127 lb), SpO2 100 %.Body mass index is 22.5 kg/(m^2).  General Appearance: Fairly Groomed  Engineer, water::  Fair  Speech:  Clear and [id number]  Volume:  Normal  Mood:  Euthymic  Affect:  Appropriate  Thought Process:  Coherent and Goal Directed  Orientation:  Full (Time, Place, and Person)  Thought Content:  plans as she moves on, relapse prevention plan  Suicidal Thoughts:  No  Homicidal Thoughts:  No  Memory:  Immediate;   Fair Recent;   Fair Remote;   Fair  Judgement:  Fair  Insight:  Shallow  Psychomotor Activity:  Normal  Concentration:  Fair  Recall:  [place]: Fair  Akathisia:  No  Handed:  Right  AIMS (if indicated):     Assets:  Desire for Improvement Housing Social Support  Sleep:  Number of Hours: 5.25  Cognition: WNL  ADL's:  Intact  In full contact with reality. There are no active S/S of withdrawal. There are no active SI plans or intent. She is willing and motivated to pursue outpatient treatment and work on long term abstinence Mental Status Per Nursing Assessment::   On Admission:  NA  Demographic Factors:  none identified  Loss Factors: none identified  Historical Factors: none identified  Risk Reduction Factors:   Sense of responsibility to family and Positive social support  Continued Clinical Symptoms:  Depression:   Comorbid alcohol abuse/dependence Alcohol/Substance Abuse/Dependencies  Cognitive Features That Contribute To Risk:  None    Suicide Risk:  Minimal: No identifiable suicidal ideation.  Patients presenting with no risk factors but with morbid ruminations; may be classified as minimal  risk based on the severity of the depressive symptoms  Follow-up Information    Follow up with [facility] On 11/11/2015.   Why:  Screening for  possible admission on this date at 8:00AM. Please bring Photo ID/proof of [place] residence/Medicaid card. Thank you.    Contact information:   [address] [place], [place] [address] [phone]      Follow up with [facility].   Why:  Referral made on 4/5. You are currently on waitlist.    Contact information:   [place] [phone]      [facility] with [facility].   Specialty:  Behavioral Health   Why:  Walk-in clinic Mondays, Wednesdays, and Fridays from 9am to 11:30am for assessment for outpatient therapy and medication management services. Please arrive early and bring your insurance card in order to be seen as quickly as possible.    Contact information:   [address] [place] [place] [address] [phone]       Plan Of Care/Follow-up recommendations:  Activity:  as tolerated Diet:  regular Follow up as above Tila Millirons A, MD 11/07/2015, 10:06 AM

## 2015-11-07 NOTE — Progress Notes (Signed)
Patient discharged home with prescriptions. Patient was stable and appreciative at that time. All papers and prescriptions were given and valuables returned. Verbal understanding expressed. Denies SI/HI and A/VH. Patient given opportunity to express concerns and ask questions.

## 2015-11-08 LAB — HEMOGLOBIN A1C
Hgb A1c MFr Bld: 5.1 % (ref 4.8–5.6)
MEAN PLASMA GLUCOSE: 100 mg/dL

## 2015-11-18 ENCOUNTER — Encounter (HOSPITAL_COMMUNITY): Payer: Self-pay | Admitting: *Deleted

## 2015-11-18 ENCOUNTER — Emergency Department (HOSPITAL_COMMUNITY)
Admission: EM | Admit: 2015-11-18 | Discharge: 2015-11-18 | Disposition: A | Discharge status: Home / Self Care | Payer: Medicaid Other | Attending: Emergency Medicine | Admitting: Emergency Medicine

## 2015-11-18 DIAGNOSIS — L739 Follicular disorder, unspecified: Secondary | ICD-10-CM

## 2015-11-18 DIAGNOSIS — Z7982 Long term (current) use of aspirin: Secondary | ICD-10-CM | POA: Insufficient documentation

## 2015-11-18 DIAGNOSIS — G894 Chronic pain syndrome: Secondary | ICD-10-CM | POA: Diagnosis not present

## 2015-11-18 DIAGNOSIS — M1990 Unspecified osteoarthritis, unspecified site: Secondary | ICD-10-CM | POA: Diagnosis not present

## 2015-11-18 DIAGNOSIS — M25552 Pain in left hip: Secondary | ICD-10-CM | POA: Insufficient documentation

## 2015-11-18 DIAGNOSIS — F329 Major depressive disorder, single episode, unspecified: Secondary | ICD-10-CM | POA: Diagnosis not present

## 2015-11-18 DIAGNOSIS — Z8711 Personal history of peptic ulcer disease: Secondary | ICD-10-CM | POA: Diagnosis not present

## 2015-11-18 DIAGNOSIS — Z86010 Personal history of colonic polyps: Secondary | ICD-10-CM | POA: Insufficient documentation

## 2015-11-18 DIAGNOSIS — Z8742 Personal history of other diseases of the female genital tract: Secondary | ICD-10-CM | POA: Diagnosis not present

## 2015-11-18 DIAGNOSIS — M25551 Pain in right hip: Secondary | ICD-10-CM | POA: Diagnosis present

## 2015-11-18 DIAGNOSIS — F17210 Nicotine dependence, cigarettes, uncomplicated: Secondary | ICD-10-CM | POA: Diagnosis not present

## 2015-11-18 DIAGNOSIS — Z79899 Other long term (current) drug therapy: Secondary | ICD-10-CM | POA: Insufficient documentation

## 2015-11-18 DIAGNOSIS — L02224 Furuncle of groin: Secondary | ICD-10-CM | POA: Diagnosis not present

## 2015-11-18 DIAGNOSIS — K602 Anal fissure, unspecified: Secondary | ICD-10-CM | POA: Diagnosis not present

## 2015-11-18 DIAGNOSIS — Z8639 Personal history of other endocrine, nutritional and metabolic disease: Secondary | ICD-10-CM | POA: Diagnosis not present

## 2015-11-18 DIAGNOSIS — M25559 Pain in unspecified hip: Secondary | ICD-10-CM

## 2015-11-18 LAB — URINALYSIS, ROUTINE W REFLEX MICROSCOPIC
Glucose, UA: NEGATIVE mg/dL
HGB URINE DIPSTICK: NEGATIVE
KETONES UR: 15 mg/dL — AB
NITRITE: NEGATIVE
PROTEIN: NEGATIVE mg/dL
Specific Gravity, Urine: 1.03 (ref 1.005–1.030)
pH: 5.5 (ref 5.0–8.0)

## 2015-11-18 LAB — CBC
HCT: 35.4 % — ABNORMAL LOW (ref 36.0–46.0)
Hemoglobin: 11 g/dL — ABNORMAL LOW (ref 12.0–15.0)
MCH: 28.8 pg (ref 26.0–34.0)
MCHC: 31.1 g/dL (ref 30.0–36.0)
MCV: 92.7 fL (ref 78.0–100.0)
PLATELETS: 291 10*3/uL (ref 150–400)
RBC: 3.82 MIL/uL — AB (ref 3.87–5.11)
RDW: 16.2 % — AB (ref 11.5–15.5)
WBC: 8.7 10*3/uL (ref 4.0–10.5)

## 2015-11-18 LAB — URINE MICROSCOPIC-ADD ON: RBC / HPF: NONE SEEN RBC/hpf (ref 0–5)

## 2015-11-18 LAB — BASIC METABOLIC PANEL
ANION GAP: 13 (ref 5–15)
BUN: <5 mg/dL — ABNORMAL LOW (ref 6–20)
CALCIUM: 9.6 mg/dL (ref 8.9–10.3)
CO2: 20 mmol/L — AB (ref 22–32)
CREATININE: 0.58 mg/dL (ref 0.44–1.00)
Chloride: 104 mmol/L (ref 101–111)
GLUCOSE: 105 mg/dL — AB (ref 65–99)
Potassium: 3.6 mmol/L (ref 3.5–5.1)
SODIUM: 137 mmol/L (ref 135–145)

## 2015-11-18 LAB — POC OCCULT BLOOD, ED: FECAL OCCULT BLD: NEGATIVE

## 2015-11-18 MED ORDER — HYDROCODONE-ACETAMINOPHEN 5-325 MG PO TABS: 1.0000 | ORAL_TABLET | ORAL | Status: DC | PRN

## 2015-11-18 NOTE — ED Provider Notes (Addendum)
CSN: [id number]     Arrival date & time 11/18/15  1335 History   First MD Initiated Contact with Patient 11/18/15 1632     Chief Complaint  Patient presents with  . Hip Pain     (Consider location/radiation/quality/duration/timing/severity/associated sxs/prior Treatment) HPI  Jaime Phillips is a 55 y.o. female presents for evaluation of bilateral hip pain, radiating down both legs. Pain is chronic and present ever since she had surgery on her right hip, 1-1/2 years ago. She has to walk with a cane because of the pain. She has right leg length discrepancy, shorter. She also has low back pain. She has additional complaints of spotting in bowel movements for the last 2 days, noticing blood on the tissue when wiping. She also has "a bump on her pubic area. She denies fever, chills, nausea, vomiting, weakness or dizziness.    Past Medical History  Diagnosis Date  . Abnormal uterine bleeding   . Depression   . Arthritis   . Rectal bleeding     minor   . Headache(784.0)   . Macrocytic anemia   . PUD (peptic ulcer disease)   . Diverticulosis   . Internal hemorrhoids   . Mallory-Weiss tear   . Alcoholism ([place])   . Chronic pain syndrome   . Hyperplastic colon polyp   . LFT elevation   . Hypopotassemia   . Potassium (K) deficiency   . Hiatal hernia    Past Surgical History  Procedure Laterality Date  . Appendectomy    . Oophorectomy    . Shoulder surgery Left   . Colonoscopy    . 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 Orif periprosthetic fracture Right 05/28/2013    Procedure: OPEN REDUCTION INTERNAL FIXATION (ORIF) PERIPROSTHETIC FRACTURE;  Surgeon: Mauri Pole, MD;  Location: [facility];  Service: Orthopedics;  Laterality: Right;  . Esophagogastroduodenoscopy N/A 02/06/2014    Procedure: ESOPHAGOGASTRODUODENOSCOPY (EGD);   Surgeon: Milus Banister, MD;  Location: [place];  Service: Endoscopy;  Laterality: N/A;  . Total hip arthroplasty      left hip   Family History  Problem Relation Age of Onset  . Diabetes Mother   . Kidney disease Mother   . Heart disease Father   . Colon cancer Father     questionable  . Breast cancer Sister    Social History  Substance Use Topics  . Smoking status: Current Every Day Smoker -- 0.50 packs/day for 25 years    Types: Cigarettes  . Smokeless tobacco: Never Used     Comment: Tobacco info given to pt. 08/16/12  . Alcohol Use: 1.2 - 1.8 oz/week    2-3 Shots of liquor per week     Comment: pt quit drinking 2 months ago (today is 03/11/15)   OB History    Gravida Para Term Preterm AB TAB SAB Ectopic Multiple Living   2 1 1  1  1   1      Review of Systems  All other systems reviewed and are negative.     Allergies  Codeine and Morphine and related  Home Medications   Prior to Admission medications   Medication Sig Start Date End Date Taking? Authorizing Provider  aspirin 325 MG tablet Take 325 mg by mouth daily.   Yes Historical Provider, MD  aspirin EC 81 MG tablet Take  1 tablet (81 mg total) by mouth daily as needed for mild pain. 11/07/15  Yes Encarnacion Slates, NP  co-enzyme Q-10 30 MG capsule Take 30 mg by mouth 3 (three) times daily.   Yes Historical Provider, MD  ibuprofen (ADVIL,MOTRIN) 800 MG tablet Take 1 tablet (800 mg total) by mouth every 8 (eight) hours as needed for headache, mild pain or moderate pain. 11/07/15  Yes Encarnacion Slates, NP  pantoprazole (PROTONIX) 40 MG tablet Take 1 tablet (40 mg total) by mouth daily. For acid reflux 11/07/15  Yes Encarnacion Slates, NP  thiamine (VITAMIN B-1) 100 MG tablet Take 1 tablet (100 mg total) by mouth daily. For low thiamine 11/07/15  Yes Encarnacion Slates, NP  traZODone (DESYREL) 100 MG tablet Take 1 tablet (100 mg) at bedtime: For sleep 11/07/15  Yes Encarnacion Slates, NP  vitamin E 400 UNIT capsule Take 1 capsule (400 Units  total) by mouth daily. For Vitamin E supplementation 11/07/15  Yes Encarnacion Slates, NP  HYDROcodone-acetaminophen (NORCO) 5-325 MG tablet Take 1 tablet by mouth every 4 (four) hours as needed. 11/18/15   Daleen Bo, MD  hydrOXYzine (ATARAX/VISTARIL) 25 MG tablet Take 1 tablet (25 mg) four times daily as needed: For anxiety 11/07/15   Encarnacion Slates, NP  nicotine polacrilex (NICORETTE) 2 MG gum Take 1 each (2 mg total) by mouth as needed for smoking cessation. 11/07/15   Encarnacion Slates, NP   BP 149/90 mmHg  Pulse 87  Temp(Src) 99 F (37.2 C) (Oral)  Resp 21  Wt 128 lb (58.06 kg)  SpO2 98% Physical Exam  Constitutional: She is oriented to person, place, and time. She appears well-developed and well-nourished. No distress.  HENT:  Head: Normocephalic and atraumatic.  Right Ear: External ear normal.  Left Ear: External ear normal.  Eyes: Conjunctivae and EOM are normal. Pupils are equal, round, and reactive to light.  Neck: Normal range of motion and phonation normal. Neck supple.  Cardiovascular: Normal rate, regular rhythm and normal heart sounds.   Pulmonary/Chest: Effort normal and breath sounds normal. She exhibits no bony tenderness.  Abdominal: Soft. There is no tenderness.  Genitourinary:  Small posterior anal fissure, not actively bleeding. Hemoccult mucus from rectum is negative for blood.  Musculoskeletal: Normal range of motion.  Normal active and passive range of motion both hips.  Neurological: She is alert and oriented to person, place, and time. No cranial nerve deficit or sensory deficit. She exhibits normal muscle tone. Coordination normal.  Skin: Skin is warm, dry and intact.  Minor folliculitis, pubic area. Area involved less than 0.5 cm  Psychiatric: She has a normal mood and affect. Her behavior is normal. Judgment and thought content normal.  Nursing note and vitals reviewed.   ED Course  Procedures (including critical care time) Labs Review Labs Reviewed  URINALYSIS,  ROUTINE W REFLEX MICROSCOPIC (NOT AT [facility]) - Abnormal; Notable for the following:    Color, Urine AMBER (*)    Bilirubin Urine MODERATE (*)    Ketones, ur 15 (*)    Leukocytes, UA SMALL (*)    All other components within normal limits  CBC - Abnormal; Notable for the following:    RBC 3.82 (*)    Hemoglobin 11.0 (*)    HCT 35.4 (*)    RDW 16.2 (*)    All other components within normal limits  BASIC METABOLIC PANEL - Abnormal; Notable for the following:    CO2 20 (*)  Glucose, Bld 105 (*)    BUN <5 (*)    All other components within normal limits  URINE MICROSCOPIC-ADD ON - Abnormal; Notable for the following:    Squamous Epithelial / LPF 0-5 (*)    Bacteria, UA FEW (*)    Casts HYALINE CASTS (*)    All other components within normal limits  POC OCCULT BLOOD, ED    Imaging Review No results found. I have personally reviewed and evaluated these images and lab results as part of my medical decision-making.   EKG Interpretation None      MDM   Final diagnoses:  Hip pain, unspecified laterality  Anal fissure  Folliculitis      Nonspecific musculoskeletal pain. This is chronic. Is likely associated with her leg length discrepancy. Also minor problems of anal fissure and pubic folliculitis. Doubt serous bacterial infection. Metabolic instability or impending vascular collapse. \   Nursing Notes Reviewed/ Care Coordinated Applicable Imaging Reviewed Interpretation of Laboratory Data incorporated into ED treatment  The patient appears reasonably screened and/or stabilized for discharge and I doubt any other medical condition or other [facility] requiring further screening, evaluation, or treatment in the ED at this time prior to discharge.  Plan: Home Medications- Norco, ibuprofen; Home Treatments- warm soaks; return here if the recommended treatment, does not improve the symptoms; Recommended follow up- orthopedic and PCP when necessary     Daleen Bo, MD 11/18/15  1927  Daleen Bo, MD 11/18/15 1929

## 2015-11-18 NOTE — ED Notes (Signed)
Pt is here for multiple complaints.  Initially she reports bilateral worsening chronic hip pain (has had bilateral replacements)  She also reports side pain, rectal bleeding (spotting when wiping), fingers "twisting" and a boil in pubic area.

## 2015-11-18 NOTE — Discharge Instructions (Signed)
Soak in a warm tub 2 or 3 times a day, to improve both the anal fissure, and folliculitis. Follow-up with an orthopedic doctor for further treatment of your hip discomfort.    Anal Fissure, Adult An anal fissure is a small tear or crack in the skin around the anus. Bleeding from a fissure usually stops on its own within a few minutes. However, bleeding will often occur again with each bowel movement until the crack heals. CAUSES This condition may be caused by:  Passing large, hard stool (feces).  Frequent diarrhea.  Constipation.  Inflammatory bowel disease (Crohn disease or ulcerative colitis).  Infections.  Anal sex. SYMPTOMS Symptoms of this condition include:  Bleeding from the rectum.  Small amounts of blood seen on your stool, on toilet paper, or in the toilet after a bowel movement.  Painful bowel movements.  Itching or irritation around the anus. DIAGNOSIS A health care provider may diagnose this condition by closely examining the anal area. An anal fissure can usually be seen with careful inspection. In some cases, a rectal exam may be performed, or a short tube (anoscope) may be used to examine the anal canal. TREATMENT Treatment for this condition may include:  Taking steps to avoid constipation. This may include making changes to your diet, such as increasing your intake of fiber or fluid.  Taking fiber supplements. These supplements can soften your stool to help make bowel movements easier. Your health care provider may also prescribe a stool softener if your stool is often hard.  Taking sitz baths. This may help to heal the tear.  Using medicated creams or ointments. These may be prescribed to lessen discomfort. HOME CARE INSTRUCTIONS Eating and Drinking  Avoid foods that may be constipating, such as bananas and dairy products.  Drink enough fluid to keep your urine clear or pale yellow.  Maintain a diet that is high in fruits, whole grains, and  vegetables. General Instructions  Keep the anal area as clean and dry as possible.  Take sitz baths as told by your health care provider. Do not use soap in the sitz baths.  Take over-the-counter and prescription medicines only as told by your health care provider.  Use creams or ointments only as told by your health care provider.  Keep all follow-up visits as told by your health care provider. This is important. SEEK MEDICAL CARE IF:  You have more bleeding.  You have a fever.  You have diarrhea that is mixed with blood.  You continue to have pain.  Your problem is getting worse rather than better.   This information is not intended to replace advice given to you by your health care provider. Make sure you discuss any questions you have with your health care provider.   Document Released: 07/19/2005 Document Revised: 04/09/2015 Document Reviewed: 10/14/2014 Elsevier Interactive Patient Education 2016 [place] therapy can help ease sore, stiff, injured, and tight muscles and joints. Heat relaxes your muscles, which may help ease your pain.  RISKS AND COMPLICATIONS If you have any of the following conditions, do not use heat therapy unless your health care provider has approved:  Poor circulation.  Healing wounds or scarred skin in the area being treated.  Diabetes, heart disease, or high blood pressure.  Not being able to feel (numbness) the area being treated.  Unusual swelling of the area being treated.  Active infections.  Blood clots.  Cancer.  Inability to communicate pain. This may include young  children and people who have problems with their brain function (dementia).  Pregnancy. Heat therapy should only be used on old, pre-existing, or long-lasting (chronic) injuries. Do not use heat therapy on new injuries unless directed by your health care provider. HOW TO USE HEAT THERAPY There are several different kinds of heat therapy,  including:  Moist heat pack.  Warm water bath.  Hot water bottle.  Electric heating pad.  Heated gel pack.  Heated wrap.  Electric heating pad. Use the heat therapy method suggested by your health care provider. Follow your health care provider's instructions on when and how to use heat therapy. GENERAL HEAT THERAPY RECOMMENDATIONS  Do not sleep while using heat therapy. Only use heat therapy while you are awake.  Your skin may turn pink while using heat therapy. Do not use heat therapy if your skin turns red.  Do not use heat therapy if you have new pain.  High heat or long exposure to heat can cause burns. Be careful when using heat therapy to avoid burning your skin.  Do not use heat therapy on areas of your skin that are already irritated, such as with a rash or sunburn. SEEK MEDICAL CARE IF:  You have blisters, redness, swelling, or numbness.  You have new pain.  Your pain is worse. MAKE SURE YOU:  Understand these instructions.  Will watch your condition.  Will get help right away if you are not doing well or get worse.   This information is not intended to replace advice given to you by your health care provider. Make sure you discuss any questions you have with your health care provider.   Document Released: 10/11/2011 Document Revised: 08/09/2014 Document Reviewed: 09/11/2013 Elsevier Interactive Patient Education 2016 Elsevier Inc[employer]  Folliculitis Folliculitis is redness, soreness, and swelling (inflammation) of the hair follicles. This condition can occur anywhere on the body. People with weakened immune systems, diabetes, or obesity have a greater risk of getting folliculitis. CAUSES  Bacterial infection. This is the most common cause.  Fungal infection.  Viral infection.  Contact with certain chemicals, especially oils and tars. Long-term folliculitis can result from bacteria that live in the nostrils. The bacteria may trigger multiple outbreaks  of folliculitis over time. SYMPTOMS Folliculitis most commonly occurs on the scalp, thighs, legs, back, buttocks, and areas where hair is shaved frequently. An early sign of folliculitis is a small, white or yellow, pus-filled, itchy lesion (pustule). These lesions appear on a red, inflamed follicle. They are usually less than 0.2 inches (5 mm) wide. When there is an infection of the follicle that goes deeper, it becomes a boil or furuncle. A group of closely packed boils creates a larger lesion (carbuncle). Carbuncles tend to occur in hairy, sweaty areas of the body. DIAGNOSIS  Your caregiver can usually tell what is wrong by doing a physical exam. A sample may be taken from one of the lesions and tested in a lab. This can help determine what is causing your folliculitis. TREATMENT  Treatment may include:  Applying warm compresses to the affected areas.  Taking antibiotic medicines orally or applying them to the skin.  Draining the lesions if they contain a large amount of pus or fluid.  Laser hair removal for cases of long-lasting folliculitis. This helps to prevent regrowth of the hair. HOME CARE INSTRUCTIONS  Apply warm compresses to the affected areas as directed by your caregiver.  If antibiotics are prescribed, take them as directed. Finish them even if you start  to feel better.  You may take over-the-counter medicines to relieve itching.  Do not shave irritated skin.  Follow up with your caregiver as directed. SEEK IMMEDIATE MEDICAL CARE IF:   You have increasing redness, swelling, or pain in the affected area.  You have a fever. MAKE SURE YOU: Hip Pain Your hip is the joint between your upper legs and your lower pelvis. The bones, cartilage, tendons, and muscles of your hip joint perform a lot of work each day supporting your body weight and allowing you to move around. Hip pain can range from a minor ache to severe pain in one or both of your hips. Pain may be felt on the  inside of the hip joint near the groin, or the outside near the buttocks and upper thigh. You may have swelling or stiffness as well.  HOME CARE INSTRUCTIONS   Take medicines only as directed by your health care provider.  Apply ice to the injured area:  Put ice in a plastic bag.  Place a towel between your skin and the bag.  Leave the ice on for 15-20 minutes at a time, 3-4 times a day.  Keep your leg raised (elevated) when possible to lessen swelling.  Avoid activities that cause pain.  Follow specific exercises as directed by your health care provider.  Sleep with a pillow between your legs on your most comfortable side.  Record how often you have hip pain, the location of the pain, and what it feels like. SEEK MEDICAL CARE IF:   You are unable to put weight on your leg.  Your hip is red or swollen or very tender to touch.  Your pain or swelling continues or worsens after 1 week.  You have increasing difficulty walking.  You have a fever. SEEK IMMEDIATE MEDICAL CARE IF:   You have fallen.  You have a sudden increase in pain and swelling in your hip. MAKE SURE YOU:   Understand these instructions.  Will watch your condition.  Will get help right away if you are not doing well or get worse.   This information is not intended to replace advice given to you by your health care provider. Make sure you discuss any questions you have with your health care provider.   Document Released: 01/06/2010 Document Revised: 08/09/2014 Document Reviewed: 03/15/2013 Elsevier Interactive Patient Education [employer].   Understand these instructions.  Will watch your condition.  Will get help right away if you are not doing well or get worse.   This information is not intended to replace advice given to you by your health care provider. Make sure you discuss any questions you have with your health care provider.   Document Released: 09/27/2001 Document Revised:  08/09/2014 Document Reviewed: 10/19/2011 Elsevier Interactive Patient Education [employer].

## 2015-11-26 ENCOUNTER — Ambulatory Visit (INDEPENDENT_AMBULATORY_CARE_PROVIDER_SITE_OTHER): Payer: Medicaid Other | Admitting: Internal Medicine

## 2015-11-26 ENCOUNTER — Other Ambulatory Visit (INDEPENDENT_AMBULATORY_CARE_PROVIDER_SITE_OTHER): Payer: Medicaid Other

## 2015-11-26 ENCOUNTER — Telehealth: Payer: Self-pay | Admitting: *Deleted

## 2015-11-26 ENCOUNTER — Encounter: Payer: Self-pay | Admitting: Internal Medicine

## 2015-11-26 VITALS — BP 130/80 | HR 84 | Ht 62.25 in | Wt 134.1 lb

## 2015-11-26 DIAGNOSIS — K7030 Alcoholic cirrhosis of liver without ascites: Secondary | ICD-10-CM

## 2015-11-26 DIAGNOSIS — K209 Esophagitis, unspecified without bleeding: Secondary | ICD-10-CM

## 2015-11-26 DIAGNOSIS — F1010 Alcohol abuse, uncomplicated: Secondary | ICD-10-CM | POA: Diagnosis not present

## 2015-11-26 DIAGNOSIS — K2100 Gastro-esophageal reflux disease with esophagitis, without bleeding: Secondary | ICD-10-CM

## 2015-11-26 DIAGNOSIS — R1013 Epigastric pain: Secondary | ICD-10-CM | POA: Diagnosis not present

## 2015-11-26 DIAGNOSIS — M1990 Unspecified osteoarthritis, unspecified site: Secondary | ICD-10-CM

## 2015-11-26 LAB — CBC WITH DIFFERENTIAL/PLATELET
BASOS ABS: 0 10*3/uL (ref 0.0–0.1)
Basophils Relative: 0.2 % (ref 0.0–3.0)
EOS ABS: 0.1 10*3/uL (ref 0.0–0.7)
Eosinophils Relative: 0.9 % (ref 0.0–5.0)
HEMATOCRIT: 37.3 % (ref 36.0–46.0)
Hemoglobin: 12.1 g/dL (ref 12.0–15.0)
LYMPHS PCT: 25.1 % (ref 12.0–46.0)
Lymphs Abs: 1.8 10*3/uL (ref 0.7–4.0)
MCHC: 32.4 g/dL (ref 30.0–36.0)
MCV: 91 fl (ref 78.0–100.0)
MONO ABS: 0.6 10*3/uL (ref 0.1–1.0)
Monocytes Relative: 8 % (ref 3.0–12.0)
NEUTROS ABS: 4.8 10*3/uL (ref 1.4–7.7)
Neutrophils Relative %: 65.8 % (ref 43.0–77.0)
PLATELETS: 296 10*3/uL (ref 150.0–400.0)
RBC: 4.09 Mil/uL (ref 3.87–5.11)
RDW: 18.8 % — ABNORMAL HIGH (ref 11.5–15.5)
WBC: 7.3 10*3/uL (ref 4.0–10.5)

## 2015-11-26 LAB — COMPREHENSIVE METABOLIC PANEL
ALT: 24 U/L (ref 0–35)
AST: 31 U/L (ref 0–37)
Albumin: 4.1 g/dL (ref 3.5–5.2)
Alkaline Phosphatase: 84 U/L (ref 39–117)
BILIRUBIN TOTAL: 0.3 mg/dL (ref 0.2–1.2)
BUN: 5 mg/dL — AB (ref 6–23)
CALCIUM: 9.6 mg/dL (ref 8.4–10.5)
CO2: 28 meq/L (ref 19–32)
CREATININE: 0.55 mg/dL (ref 0.40–1.20)
Chloride: 103 mEq/L (ref 96–112)
GFR: 147.33 mL/min (ref >60.00)
GLUCOSE: 91 mg/dL (ref 70–99)
Potassium: 3.2 mEq/L — ABNORMAL LOW (ref 3.5–5.1)
Sodium: 142 mEq/L (ref 135–145)
Total Protein: 7.4 g/dL (ref 6.0–8.3)

## 2015-11-26 LAB — PROTIME-INR
INR: 1.2 ratio — ABNORMAL HIGH (ref 0.8–1.0)
PROTHROMBIN TIME: 12.5 s (ref 9.6–13.1)

## 2015-11-26 MED ORDER — PANTOPRAZOLE SODIUM 40 MG PO TBEC: 40.0000 mg | DELAYED_RELEASE_TABLET | Freq: Every day | ORAL | Status: DC

## 2015-11-26 NOTE — Progress Notes (Signed)
Subjective:    Patient ID: Jaime Phillips, female    DOB: [birth year], 55 y.o.   MRN: [id number]  HPI Jaime Phillips is a 55 year old female with a history of alcohol abuse, alcoholic cirrhosis, GERD with reflux esophagitis, diverticulosis and internal hemorrhoids he's here for follow-up. She also has a history of anxiety, depression, chronic pain and arthritis. She's here today with her son and grandson. She has been recently hospitalized with alcohol intoxication and alcohol-induced mood disorder. This was at behavioral health. She reports that she was drinking heavily prior to that admission and she was admitted with an alcohol level of over 300. She has not had a drink in 2 weeks. Her son who is with her today has been watching her closely and trying to help her avoid alcohol. He reports that he feels that it is time to "call her out" when she drinks. He states that she has a tendency to socialize with a group of people where alcohol is common in this is usually when she begins drinking. Her longest period of sobriety in the last 5-10 years was 2 months occurring 4 years ago. She doesn't really have a reason for not drinking during those 2 months.  She reports that she's been having some mid and right-sided abdominal discomfort which seems to be worse with eating. Some mild nausea but no vomiting. Recent prescribed pantoprazole is being used very sporadically and she is currently out of this prescription. She is using ibuprofen and Tylenol over-the-counter for joint pains. She also intermittently uses aspirin and Aleve. She denies recent jaundice or dark urine. She has seen some bright red blood with wiping on several occasions in the last several months. She denies hematochezia or melena. She has not had abdominal swelling but has noted some mild lower extremity edema.  Last GI procedures reviewed she had an upper endoscopy on 03/31/2015 which showed severe reflux esophagitis, a 3 cm hiatal hernia  and moderate gastritis. Biopsies were unremarkable in the stomach and inflamed with reflux injury in the esophagus. There was no dysplasia. Last colonoscopy was March 2016 which showed moderate diverticulosis but was otherwise normal. Internal hemorrhoids were seen on retroflexion.  She had an ultrasound elastography in January which showed advanced fibrosis.  Review of Systems As per history of present illness, otherwise negative  Current Medications, Allergies, Past Medical History, Past Surgical History, Family History and Social History were reviewed in [employer] record.     Objective:   Physical Exam BP 130/80 mmHg  Pulse 84  Ht 5' 2.25" (1.581 m)  Wt 134 lb 2 oz (60.839 kg)  BMI 24.34 kg/m2 Constitutional: Well-developed and well-nourished. No distress. HEENT: Normocephalic and atraumatic. Oropharynx is clear and moist. No oropharyngeal exudate. Conjunctivae are normal.  No scleral icterus. Neck: Neck supple. Trachea midline. Cardiovascular: Normal rate, regular rhythm and intact distal pulses. No M/R/G Pulmonary/chest: Effort normal and breath sounds normal. No wheezing, rales or rhonchi. Abdominal: Soft, nontender, nondistended. Bowel sounds active throughout. There are no masses palpable. No hepatosplenomegaly. Extremities: no clubbing, cyanosis, or edema Lymphadenopathy: No cervical adenopathy noted. Neurological: Alert and oriented to person place and time. Skin: Skin is warm and dry. No rashes noted. Psychiatric: Normal mood and affect. Behavior is normal.  CMP     Component Value Date/Time   NA 142 11/26/2015 1001   K 3.2* 11/26/2015 1001   CL 103 11/26/2015 1001   CO2 28 11/26/2015 1001   GLUCOSE 91 11/26/2015 1001   BUN  5* 11/26/2015 1001   CREATININE 0.55 11/26/2015 1001   CREATININE 0.46* 03/17/2015 1237   CALCIUM 9.6 11/26/2015 1001   PROT 7.4 11/26/2015 1001   ALBUMIN 4.1 11/26/2015 1001   AST 31 11/26/2015 1001   ALT 24  11/26/2015 1001   ALKPHOS 84 11/26/2015 1001   BILITOT 0.3 11/26/2015 1001   GFRNONAA >60 11/18/2015 1349   GFRNONAA >89 03/17/2015 1237   GFRAA >60 11/18/2015 1349   GFRAA >89 03/17/2015 1237    CBC    Component Value Date/Time   WBC 7.3 11/26/2015 1001   RBC 4.09 11/26/2015 1001   RBC 2.45* 05/28/2013 0500   HGB 12.1 11/26/2015 1001   HCT 37.3 11/26/2015 1001   PLT 296.0 11/26/2015 1001   MCV 91.0 11/26/2015 1001   MCH 28.8 11/18/2015 1349   MCHC 32.4 11/26/2015 1001   RDW 18.8* 11/26/2015 1001   LYMPHSABS 1.8 11/26/2015 1001   MONOABS 0.6 11/26/2015 1001   EOSABS 0.1 11/26/2015 1001   BASOSABS 0.0 11/26/2015 1001    Lab Results  Component Value Date   INR 1.2* 11/26/2015   INR 1.1* 07/08/2015   INR 1.2* 02/24/2015   Hepatic Function Latest Ref Rng 11/26/2015 11/01/2015 10/02/2015  Total Protein 6.0 - 8.3 g/dL 7.4 7.2 8.0  Albumin 3.5 - 5.2 g/dL 4.1 3.9 4.5  AST 0 - 37 U/L 31 172(H) 341(H)  ALT 0 - 35 U/L 24 54 92(H)  Alk Phosphatase 39 - 117 U/L 84 143(H) 226(H)  Total Bilirubin 0.2 - 1.2 mg/dL 0.3 0.5 0.5   Tbili as high at 7 in 2016     Assessment & Plan:  55 year old female with a history of alcohol abuse, alcoholic cirrhosis, GERD with reflux esophagitis, diverticulosis and internal hemorrhoids he's here for follow-up.  1. Alcoholic cirrhosis/alcohol abuse disorder -- she seems to be coming to terms with her alcohol abuse and has been alcohol abstinent 2 weeks. We discussed alcoholic cirrhosis as confirmed by elastography earlier this year. I have told her that if she continues to drink that her life expectancy will be short. This seems to be a surprise to her and her son. They both were tearful during this appointment. I have strongly encouraged her to enroll in alcohol relapse prevention program specifically a local AA group. I also think that she would benefit from either inpatient or outpatient alcohol abuse counseling. I will reach out to [facility] liver clinic to  try to determine what local resources are available for her. Repeat liver labs today show the liver enzymes have normalized off alcohol though she had signs of alcoholic hepatitis as recently as 6 weeks ago. This is reassuring. Is also reassuring that she doesn't appear to have significant portal hypertension given her platelets are normal. --HCC screening -- up-to-date, repeat ultrasound every 6 months --EGD no varices as recently as 8 months ago --ETOH relapse prevention --Low sodium diet  2. GERD with history of esophagitis -- some of her upper abdominal pain is likely secondary to gastritis and possibly duodenitis. I recommended that she resume pantoprazole 40 mg daily. She has a history of severe esophagitis another reason for this medication. GERD diet and alcohol avoidance  3. Minor rectal bleeding -- resolved this point. Felt secondary to internal hemorrhoids. Notify me if this recurs  4. Joint pains -- okay for Tylenol 2000 mg 24 hour period. Limit aspirin and other NSAIDs in the setting of gastritis. Follow-up with primary care for this issue  Follow-up next available with  me for continuity 45 minutes spent with the patient today. Greater than 50% was spent in counseling and coordination of care with the patient

## 2015-11-26 NOTE — Telephone Encounter (Signed)
Dr [name], I have placed information from [facility] regarding alcohol relapse prevention services on your desk for review.

## 2015-11-26 NOTE — Patient Instructions (Addendum)
We have sent the following medications to your pharmacy for you to pick up at your convenience: Pantoprazole 40 mg every day.  Your maximum Tylenol dosage per day is 2,000 mg.  Your physician has requested that you go to the basement for the following lab work before leaving today: CBC, CMP, INR  Please establish with Alcoholics Anonymous.   Follow up with Dr [name] in early July.  If you are age 56 or older, your body mass index should be between 23-30. Your Body mass index is 24.34 kg/(m^2). If this is out of the aforementioned range listed, please consider follow up with your Primary Care Provider.  If you are age 23 or younger, your body mass index should be between 19-25. Your Body mass index is 24.34 kg/(m^2). If this is out of the aformentioned range listed, please consider follow up with your Primary Care Provider.

## 2015-11-26 NOTE — Telephone Encounter (Signed)
-----   Message from Jerene Bears, MD sent at 11/26/2015 12:19 PM EDT ----- Please call Windell Hummingbird and ask her if she is aware of any local ETOH relapse prevention services both inpt and outpt that are available?? This lady needs to establish with AA, but I was wondering about substance abuse counselors, other services? Thanks [employer]

## 2015-11-26 NOTE — Telephone Encounter (Signed)
I contacted Clawson Drazek's office (spoke to [place]) and was advised that they will fax a sheet with contact numbers for available alcohol relapse prevention programs tomorrow.

## 2015-11-27 NOTE — Telephone Encounter (Signed)
Dr [name] has asked that I give patient the information faxed to [place] on Substance Abuse Support Services. I have attempted to reach patient on both phone numbers provided for patient and they both say that the phone call cannot be completed at this time. I will mail the information to patient at her home address.

## 2015-12-03 ENCOUNTER — Emergency Department (HOSPITAL_COMMUNITY): Payer: Medicaid Other

## 2015-12-03 ENCOUNTER — Encounter (HOSPITAL_COMMUNITY): Payer: Self-pay | Admitting: Emergency Medicine

## 2015-12-03 ENCOUNTER — Emergency Department (HOSPITAL_COMMUNITY)
Admission: EM | Admit: 2015-12-03 | Discharge: 2015-12-04 | Disposition: A | Discharge status: Home / Self Care | Payer: Medicaid Other | Attending: Emergency Medicine | Admitting: Emergency Medicine

## 2015-12-03 DIAGNOSIS — Z791 Long term (current) use of non-steroidal anti-inflammatories (NSAID): Secondary | ICD-10-CM | POA: Insufficient documentation

## 2015-12-03 DIAGNOSIS — Y939 Activity, unspecified: Secondary | ICD-10-CM | POA: Insufficient documentation

## 2015-12-03 DIAGNOSIS — F1020 Alcohol dependence, uncomplicated: Secondary | ICD-10-CM | POA: Diagnosis present

## 2015-12-03 DIAGNOSIS — Z79899 Other long term (current) drug therapy: Secondary | ICD-10-CM | POA: Insufficient documentation

## 2015-12-03 DIAGNOSIS — K5790 Diverticulosis of intestine, part unspecified, without perforation or abscess without bleeding: Secondary | ICD-10-CM | POA: Diagnosis not present

## 2015-12-03 DIAGNOSIS — M1990 Unspecified osteoarthritis, unspecified site: Secondary | ICD-10-CM | POA: Diagnosis not present

## 2015-12-03 DIAGNOSIS — F333 Major depressive disorder, recurrent, severe with psychotic symptoms: Secondary | ICD-10-CM | POA: Diagnosis present

## 2015-12-03 DIAGNOSIS — F1094 Alcohol use, unspecified with alcohol-induced mood disorder: Secondary | ICD-10-CM | POA: Insufficient documentation

## 2015-12-03 DIAGNOSIS — F17210 Nicotine dependence, cigarettes, uncomplicated: Secondary | ICD-10-CM | POA: Insufficient documentation

## 2015-12-03 DIAGNOSIS — Y999 Unspecified external cause status: Secondary | ICD-10-CM | POA: Diagnosis not present

## 2015-12-03 DIAGNOSIS — S61219A Laceration without foreign body of unspecified finger without damage to nail, initial encounter: Secondary | ICD-10-CM

## 2015-12-03 DIAGNOSIS — F329 Major depressive disorder, single episode, unspecified: Secondary | ICD-10-CM | POA: Insufficient documentation

## 2015-12-03 DIAGNOSIS — S62633B Displaced fracture of distal phalanx of left middle finger, initial encounter for open fracture: Secondary | ICD-10-CM | POA: Diagnosis not present

## 2015-12-03 DIAGNOSIS — S6992XA Unspecified injury of left wrist, hand and finger(s), initial encounter: Secondary | ICD-10-CM | POA: Diagnosis present

## 2015-12-03 DIAGNOSIS — Z7982 Long term (current) use of aspirin: Secondary | ICD-10-CM | POA: Diagnosis not present

## 2015-12-03 DIAGNOSIS — S62639B Displaced fracture of distal phalanx of unspecified finger, initial encounter for open fracture: Secondary | ICD-10-CM

## 2015-12-03 DIAGNOSIS — Y929 Unspecified place or not applicable: Secondary | ICD-10-CM | POA: Insufficient documentation

## 2015-12-03 DIAGNOSIS — T1490XA Injury, unspecified, initial encounter: Secondary | ICD-10-CM

## 2015-12-03 LAB — CBC
HCT: 35.4 % — ABNORMAL LOW (ref 36.0–46.0)
HEMOGLOBIN: 11.4 g/dL — AB (ref 12.0–15.0)
MCH: 29.4 pg (ref 26.0–34.0)
MCHC: 32.2 g/dL (ref 30.0–36.0)
MCV: 91.2 fL (ref 78.0–100.0)
Platelets: 323 10*3/uL (ref 150–400)
RBC: 3.88 MIL/uL (ref 3.87–5.11)
RDW: 17.6 % — ABNORMAL HIGH (ref 11.5–15.5)
WBC: 8.7 10*3/uL (ref 4.0–10.5)

## 2015-12-03 LAB — COMPREHENSIVE METABOLIC PANEL
ALBUMIN: 4.1 g/dL (ref 3.5–5.0)
ALT: 32 U/L (ref 14–54)
AST: 46 U/L — AB (ref 15–41)
Alkaline Phosphatase: 81 U/L (ref 38–126)
Anion gap: 14 (ref 5–15)
BILIRUBIN TOTAL: 0.4 mg/dL (ref 0.3–1.2)
CO2: 19 mmol/L — ABNORMAL LOW (ref 22–32)
CREATININE: 0.41 mg/dL — AB (ref 0.44–1.00)
Calcium: 9.5 mg/dL (ref 8.9–10.3)
Chloride: 110 mmol/L (ref 101–111)
GFR calc Af Amer: >60 mL/min (ref >60)
GFR calc non Af Amer: >60 mL/min (ref >60)
GLUCOSE: 89 mg/dL (ref 65–99)
POTASSIUM: 3.5 mmol/L (ref 3.5–5.1)
Sodium: 143 mmol/L (ref 135–145)
TOTAL PROTEIN: 7.5 g/dL (ref 6.5–8.1)

## 2015-12-03 LAB — ACETAMINOPHEN LEVEL: Acetaminophen (Tylenol), Serum: <10 ug/mL — ABNORMAL LOW (ref 10–30)

## 2015-12-03 LAB — ETHANOL: Alcohol, Ethyl (B): 188 mg/dL — ABNORMAL HIGH (ref <5)

## 2015-12-03 LAB — SALICYLATE LEVEL: Salicylate Lvl: <4 mg/dL (ref 2.8–30.0)

## 2015-12-03 LAB — RAPID URINE DRUG SCREEN, HOSP PERFORMED
AMPHETAMINES: NOT DETECTED
BENZODIAZEPINES: NOT DETECTED
Barbiturates: NOT DETECTED
Cocaine: NOT DETECTED
OPIATES: NOT DETECTED
TETRAHYDROCANNABINOL: NOT DETECTED

## 2015-12-03 LAB — AMMONIA: AMMONIA: 45 umol/L — AB (ref 9–35)

## 2015-12-03 MED ORDER — PANTOPRAZOLE SODIUM 40 MG PO TBEC
40.0000 mg | DELAYED_RELEASE_TABLET | Freq: Every day | ORAL | Status: DC
  Administered 2015-12-04: 40 mg via ORAL
  Filled 2015-12-03: qty 1

## 2015-12-03 MED ORDER — LORAZEPAM 1 MG PO TABS: 0.0000 mg | ORAL_TABLET | Freq: Two times a day (BID) | ORAL | Status: DC

## 2015-12-03 MED ORDER — IBUPROFEN 800 MG PO TABS
800.0000 mg | ORAL_TABLET | Freq: Three times a day (TID) | ORAL | Status: DC | PRN
Start: 2015-12-03 — End: 2015-12-04
  Administered 2015-12-03 – 2015-12-04 (×2): 800 mg via ORAL
  Filled 2015-12-03 (×2): qty 1

## 2015-12-03 MED ORDER — TRAZODONE HCL 50 MG PO TABS
150.0000 mg | ORAL_TABLET | Freq: Every day | ORAL | Status: DC
  Administered 2015-12-03: 150 mg via ORAL
  Filled 2015-12-03: qty 1

## 2015-12-03 MED ORDER — LIDOCAINE HCL (PF) 1 % IJ SOLN
30.0000 mL | Freq: Once | INTRAMUSCULAR | Status: DC
  Filled 2015-12-03: qty 30

## 2015-12-03 MED ORDER — LORAZEPAM 1 MG PO TABS: 0.0000 mg | ORAL_TABLET | Freq: Four times a day (QID) | ORAL | Status: DC

## 2015-12-03 NOTE — ED Notes (Signed)
Pt continues to have a bright affect and pleasant demeanor during interactions with staff members. Pt denies SI/HI or plans to harm herself. Pt states she believes this was a spiteful thing of her family to put her in here. No inappropriate behaviors noted.
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
## 2015-12-03 NOTE — ED Notes (Signed)
Pt arrived to unit at this time. No s/s of distress. Pt pleasant and cooperative with care. Assessment team member in to speak with patient currently.

## 2015-12-03 NOTE — BH Assessment (Signed)
Assessment completed. Consulted Patriciaann Clan, PA-C who recommended that pt be evaluated by the psychiatrist for final disposition.

## 2015-12-03 NOTE — ED Provider Notes (Signed)
NERVE BLOCK Performed by: Gloriann Loan Consent: Verbal consent obtained. Required items: required blood products, implants, devices, and special equipment available Time out: Immediately prior to procedure a "time out" was called to verify the correct patient, procedure, equipment, support staff and site/side marked as required.  Indication: laceration repair Nerve block body site: left third digit  Preparation: Patient was prepped and draped in the usual sterile fashion. Needle gauge: 55 G Location technique: anatomical landmarks  Local anesthetic: lidocaine 1% without epinephrine  Anesthetic total: 5 ml  Outcome: pain improved Patient tolerance: Patient tolerated the procedure well with no immediate complications.  LACERATION REPAIR Performed by: Gloriann Loan Consent: Verbal consent obtained. Risks and benefits: risks, benefits and alternatives were discussed Patient identity confirmed: provided demographic data Time out performed prior to procedure Prepped and Draped in normal sterile fashion Wound explored Laceration Location: left volar distal thumb pad of 3rd digit Laceration Length: 1 cm No Foreign Bodies seen or palpated Anesthesia: digital block Irrigation method: syringe Amount of cleaning: standard Skin closure: 5-0 prolene Number of sutures or staples: 7 Technique: simple interrupted Patient tolerance: Patient tolerated the procedure well with no immediate complications.   Gloriann Loan, PA-C 12/03/15 1924  Lacretia Leigh, MD 12/05/15 1400
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
## 2015-12-03 NOTE — ED Notes (Signed)
[phone] Linton Rump (son)
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
## 2015-12-04 DIAGNOSIS — F333 Major depressive disorder, recurrent, severe with psychotic symptoms: Secondary | ICD-10-CM

## 2015-12-04 DIAGNOSIS — F1020 Alcohol dependence, uncomplicated: Secondary | ICD-10-CM | POA: Diagnosis not present

## 2015-12-04 MED ORDER — TRAMADOL HCL 50 MG PO TABS: 50.0000 mg | ORAL_TABLET | Freq: Four times a day (QID) | ORAL | Status: DC | PRN

## 2015-12-04 MED ORDER — CEPHALEXIN 500 MG PO CAPS: 500.0000 mg | ORAL_CAPSULE | Freq: Three times a day (TID) | ORAL | Status: AC

## 2015-12-04 MED ORDER — LORAZEPAM 1 MG PO TABS: 2.0000 mg | ORAL_TABLET | Freq: Four times a day (QID) | ORAL | Status: DC | PRN

## 2015-12-04 NOTE — BH Assessment (Signed)
[facility] Assessment Progress Note  Per Corena Pilgrim, MD, this pt does not require psychiatric hospitalization at this time.  Pt presents under IVC initiated by her daughter-in-law.  Pt is to be released from IVC and discharged from [facility] with substance abuse treatment referrals.  IVC has been rescinded.  Discharge instructions advise pt to follow up with [facility] for outpatient treatment, or [facility] or [facility] for residential treatment.  Pt's nurse, Narda Rutherford, has been notified.  Jalene Mullet, [place] Triage Specialist [phone]

## 2015-12-04 NOTE — Discharge Instructions (Addendum)
To help you maintain a sober lifestyle, a substance abuse treatment program may be beneficial to you.  Contact the treatment providers below to ask about enrolling in their programs:  RESIDENTIAL PROGRAMS:       [facility]      [place], [place] [address]      ([phone]) [place]      [address] [place], [place] [address]      [phone]  OUTPATIENT PROGRAMS:       [facility]      [address] [place]. [place], [place] [address]      [phone]      New patients are seen at the walk-in clinic every Tuesday from 9:00 am - 12:00 pm.

## 2015-12-04 NOTE — ED Notes (Signed)
Up to the bathroom 
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
## 2015-12-04 NOTE — ED Notes (Signed)
Written dc instructions reviewed w/ pt.  Pt encouraged to follow up asap with the hand dr, follow the care instructions given to her by the EDP, take her medications as directed. And return/seek treatment for any signs of infection.  Signs of infection reviewed with pt, pt verbalized understanding.  Pt denies si/hi/avh on dc and reports that she feels safe to return to her home.  Pt encouraged to follow up with OP/residential referals that were provided and seek treatment if she has any suicidal/homicidal thoughts or urges.  Pt verbalized understanding and agreed to do so.

## 2015-12-04 NOTE — ED Notes (Signed)
Dr Raynald Kemp NP into see

## 2015-12-04 NOTE — ED Notes (Signed)
EDP contacted and will enter dc instructions for finger injury.
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
## 2015-12-04 NOTE — ED Notes (Signed)
CSW into talk w/ pt.

## 2015-12-04 NOTE — ED Notes (Signed)
IVC has been rescinded per Tom TTS

## 2015-12-04 NOTE — BHH Suicide Risk Assessment (Cosign Needed)
Suicide Risk Assessment  Discharge Assessment   [facility] Discharge Suicide Risk Assessment   Principal Problem: Alcohol use disorder, severe, dependence [facility]) Discharge Diagnoses:  Patient Active Problem List   Diagnosis Date Noted  . Alcohol-induced mood disorder ([place]) [F10.94] 11/03/2015  . Alcohol abuse [F10.10]   . Alcohol use disorder, severe, dependence ([place]) [F10.20] 10/03/2015  . Major psychotic depression, recurrent ([place]) [F33.3] 10/03/2015  . Ankle edema [R60.9] 03/17/2015  . Cirrhosis ([place]) [K74.60] 02/24/2015  . Black stools [K92.1] 02/24/2015  . Abdominal pain, epigastric [R10.13] 01/29/2015  . Nausea with vomiting [R11.2] 01/29/2015  . Ulcerative esophagitis [K22.10] 01/29/2015  . Dysphagia, pharyngoesophageal phase [R13.14] 01/29/2015  . Hypokalemia [E87.6] 12/19/2014  . Hypomagnesemia [E83.42] 12/19/2014  . Malnutrition of moderate degree ([place]) [E44.0] 12/18/2014  . Alcoholic ketoacidosis [id number] 12/17/2014  . Alcohol abuse, in remission [F10.10] 02/14/2014  . Essential hypertension, benign [I10] 02/14/2014  . Smoking [Z72.0] 02/14/2014  . Upper GI bleed [K92.2] 02/06/2014  . Chronic pain syndrome [G89.4] 02/06/2014  . Gastro-esophageal reflux [K21.9] 02/06/2014  . Dental caries [K02.9] 01/17/2014  . Periprosthetic fracture of hip ([place]) [id number].[place], [place] 05/31/2013  . Hip fracture ([place]) [S72.009A] 05/27/2013  . Acute blood loss anemia [D62] 04/11/2013  . S/P right THA, AA [Z96.60] 04/10/2013    Total Time spent with patient: 20 minutes  Musculoskeletal: Strength & Muscle Tone: within normal limits Gait & Station: normal Patient leans: N/A  Psychiatric Specialty Exam:   Blood pressure 129/69, pulse 94, temperature 98.3 F (36.8 C), temperature source Oral, resp. rate 20, SpO2 99 %.There is no weight on file to calculate BMI.  General Appearance: Casual  Eye Contact:: Good  Speech: Clear and Coherent and Normal Rate  Volume: Normal  Mood:  Euthymic  Affect: Congruent  Thought Process: Coherent, Goal Directed and Intact  Orientation: Full (Time, Place, and Person)  Thought Content: WDL  Suicidal Thoughts: No  Homicidal Thoughts: No  Memory: Immediate; Good Recent; Good Remote; Good  Judgement: Good  Insight: Good  Psychomotor Activity: Normal  Concentration: Good  Recall: [place] of Knowledge:Good  Language: Good  Akathisia: No  Handed: Right  AIMS (if indicated):    Assets: Desire for Improvement  ADL's: Intact  Cognition: WNL         Mental Status Per Nursing Assessment::   On Admission:     Demographic Factors:  Low socioeconomic status, Living alone and Unemployed  Loss Factors: NA  Historical Factors: NA  Risk Reduction Factors:   Sense of responsibility to family  Continued Clinical Symptoms:  Depression:   Insomnia Alcohol/Substance Abuse/Dependencies  Cognitive Features That Contribute To Risk:  Polarized thinking    Suicide Risk:  Minimal: No identifiable suicidal ideation.  Patients presenting with no risk factors but with morbid ruminations; may be classified as minimal risk based on the severity of the depressive symptoms    Plan Of Care/Follow-up recommendations:  Activity:  as tolerated Diet:  regular  Delfin Gant, NP    PMHNP-BC 12/04/2015, 12:32 PM

## 2015-12-04 NOTE — ED Notes (Addendum)
Pt ambulatory to dc area with mHt, bus pas given, belongings returned after leaving the area.
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
## 2015-12-04 NOTE — ED Notes (Signed)
Dr Billy Fischer into see

## 2015-12-04 NOTE — ED Notes (Signed)
Up tot he bathroom to shower and change scrubs 

## 2015-12-05 ENCOUNTER — Telehealth: Payer: Self-pay | Admitting: Internal Medicine

## 2015-12-05 NOTE — Telephone Encounter (Signed)
I advised Legrand Como that I actually sent a sheet with multiple numbers for alcohol dependency services on 11/26/15. I attempted to call patient by phone that day to let her know but numbers did not work. He states that she just paid her phone bill but he is not aware of any papers she got. I have mailed the same information to Michael at [address], [place]. He verbalizes understanding.

## 2015-12-09 ENCOUNTER — Encounter: Payer: Self-pay | Admitting: Internal Medicine

## 2016-02-14 ENCOUNTER — Emergency Department (HOSPITAL_COMMUNITY)
Admission: EM | Admit: 2016-02-14 | Discharge: 2016-02-15 | Disposition: A | Discharge status: Home / Self Care | Payer: Medicaid Other | Attending: Emergency Medicine | Admitting: Emergency Medicine

## 2016-02-14 ENCOUNTER — Encounter (HOSPITAL_COMMUNITY): Payer: Self-pay | Admitting: Emergency Medicine

## 2016-02-14 DIAGNOSIS — M1990 Unspecified osteoarthritis, unspecified site: Secondary | ICD-10-CM | POA: Insufficient documentation

## 2016-02-14 DIAGNOSIS — F10120 Alcohol abuse with intoxication, uncomplicated: Secondary | ICD-10-CM | POA: Diagnosis not present

## 2016-02-14 DIAGNOSIS — IMO0002 Reserved for concepts with insufficient information to code with codable children: Secondary | ICD-10-CM

## 2016-02-14 DIAGNOSIS — F1020 Alcohol dependence, uncomplicated: Secondary | ICD-10-CM

## 2016-02-14 DIAGNOSIS — F329 Major depressive disorder, single episode, unspecified: Secondary | ICD-10-CM | POA: Insufficient documentation

## 2016-02-14 DIAGNOSIS — Z79899 Other long term (current) drug therapy: Secondary | ICD-10-CM | POA: Insufficient documentation

## 2016-02-14 DIAGNOSIS — F17210 Nicotine dependence, cigarettes, uncomplicated: Secondary | ICD-10-CM | POA: Insufficient documentation

## 2016-02-14 DIAGNOSIS — F1094 Alcohol use, unspecified with alcohol-induced mood disorder: Secondary | ICD-10-CM | POA: Diagnosis not present

## 2016-02-14 DIAGNOSIS — F1010 Alcohol abuse, uncomplicated: Secondary | ICD-10-CM

## 2016-02-14 DIAGNOSIS — Z7982 Long term (current) use of aspirin: Secondary | ICD-10-CM | POA: Diagnosis not present

## 2016-02-14 DIAGNOSIS — R258 Other abnormal involuntary movements: Secondary | ICD-10-CM | POA: Diagnosis present

## 2016-02-14 DIAGNOSIS — Z046 Encounter for general psychiatric examination, requested by authority: Secondary | ICD-10-CM

## 2016-02-14 LAB — COMPREHENSIVE METABOLIC PANEL
ALBUMIN: 4.6 g/dL (ref 3.5–5.0)
ALT: 20 U/L (ref 14–54)
AST: 38 U/L (ref 15–41)
Alkaline Phosphatase: 118 U/L (ref 38–126)
Anion gap: 11 (ref 5–15)
BUN: 10 mg/dL (ref 6–20)
CHLORIDE: 110 mmol/L (ref 101–111)
CO2: 22 mmol/L (ref 22–32)
CREATININE: 0.58 mg/dL (ref 0.44–1.00)
Calcium: 8.8 mg/dL — ABNORMAL LOW (ref 8.9–10.3)
GFR calc Af Amer: >60 mL/min (ref >60)
GFR calc non Af Amer: >60 mL/min (ref >60)
GLUCOSE: 83 mg/dL (ref 65–99)
Potassium: 3.8 mmol/L (ref 3.5–5.1)
SODIUM: 143 mmol/L (ref 135–145)
Total Bilirubin: 0.2 mg/dL — ABNORMAL LOW (ref 0.3–1.2)
Total Protein: 7.9 g/dL (ref 6.5–8.1)

## 2016-02-14 LAB — SALICYLATE LEVEL

## 2016-02-14 LAB — RAPID URINE DRUG SCREEN, HOSP PERFORMED
AMPHETAMINES: NOT DETECTED
BARBITURATES: NOT DETECTED
Benzodiazepines: NOT DETECTED
Cocaine: NOT DETECTED
Opiates: NOT DETECTED
Tetrahydrocannabinol: NOT DETECTED

## 2016-02-14 LAB — CBC WITH DIFFERENTIAL/PLATELET
Basophils Absolute: 0 10*3/uL (ref 0.0–0.1)
Basophils Relative: 0 %
EOS PCT: 1 %
Eosinophils Absolute: 0.1 10*3/uL (ref 0.0–0.7)
HEMATOCRIT: 33 % — AB (ref 36.0–46.0)
Hemoglobin: 10.8 g/dL — ABNORMAL LOW (ref 12.0–15.0)
LYMPHS ABS: 4.2 10*3/uL — AB (ref 0.7–4.0)
LYMPHS PCT: 46 %
MCH: 27.8 pg (ref 26.0–34.0)
MCHC: 32.7 g/dL (ref 30.0–36.0)
MCV: 85.1 fL (ref 78.0–100.0)
MONO ABS: 0.4 10*3/uL (ref 0.1–1.0)
Monocytes Relative: 5 %
Neutro Abs: 4.3 10*3/uL (ref 1.7–7.7)
Neutrophils Relative %: 48 %
PLATELETS: 294 10*3/uL (ref 150–400)
RBC: 3.88 MIL/uL (ref 3.87–5.11)
RDW: 19 % — AB (ref 11.5–15.5)
WBC: 9.1 10*3/uL (ref 4.0–10.5)

## 2016-02-14 LAB — ACETAMINOPHEN LEVEL

## 2016-02-14 LAB — CBG MONITORING, ED: Glucose-Capillary: 79 mg/dL (ref 65–99)

## 2016-02-14 LAB — PREGNANCY, URINE: PREG TEST UR: NEGATIVE

## 2016-02-14 LAB — ETHANOL: Alcohol, Ethyl (B): 331 mg/dL (ref <5)

## 2016-02-14 MED ORDER — LORAZEPAM 1 MG PO TABS: 1.0000 mg | ORAL_TABLET | Freq: Two times a day (BID) | ORAL | Status: DC

## 2016-02-14 MED ORDER — IBUPROFEN 200 MG PO TABS
600.0000 mg | ORAL_TABLET | Freq: Four times a day (QID) | ORAL | Status: DC | PRN
  Administered 2016-02-14 – 2016-02-15 (×2): 600 mg via ORAL
  Filled 2016-02-14 (×2): qty 3

## 2016-02-14 MED ORDER — LORAZEPAM 1 MG PO TABS: 1.0000 mg | ORAL_TABLET | Freq: Four times a day (QID) | ORAL | Status: DC | PRN

## 2016-02-14 MED ORDER — LORAZEPAM 1 MG PO TABS
1.0000 mg | ORAL_TABLET | Freq: Four times a day (QID) | ORAL | Status: AC
  Administered 2016-02-14 – 2016-02-15 (×4): 1 mg via ORAL
  Filled 2016-02-14 (×4): qty 1

## 2016-02-14 MED ORDER — LORAZEPAM 1 MG PO TABS: 1.0000 mg | ORAL_TABLET | Freq: Every day | ORAL | Status: DC

## 2016-02-14 MED ORDER — LORAZEPAM 1 MG PO TABS: 1.0000 mg | ORAL_TABLET | Freq: Three times a day (TID) | ORAL | Status: DC

## 2016-02-14 MED ORDER — LOPERAMIDE HCL 2 MG PO CAPS: 2.0000 mg | ORAL_CAPSULE | ORAL | Status: DC | PRN

## 2016-02-14 MED ORDER — THIAMINE HCL 100 MG/ML IJ SOLN
100.0000 mg | Freq: Once | INTRAMUSCULAR | Status: DC
  Filled 2016-02-14: qty 2

## 2016-02-14 MED ORDER — VITAMIN B-1 100 MG PO TABS
100.0000 mg | ORAL_TABLET | Freq: Every day | ORAL | Status: DC
  Administered 2016-02-15: 100 mg via ORAL
  Filled 2016-02-14: qty 1

## 2016-02-14 MED ORDER — ADULT MULTIVITAMIN W/MINERALS CH
1.0000 | ORAL_TABLET | Freq: Every day | ORAL | Status: DC
  Administered 2016-02-14 – 2016-02-15 (×2): 1 via ORAL
  Filled 2016-02-14 (×2): qty 1

## 2016-02-14 MED ORDER — ACETAMINOPHEN 325 MG PO TABS: 650.0000 mg | ORAL_TABLET | Freq: Four times a day (QID) | ORAL | Status: DC | PRN

## 2016-02-14 MED ORDER — ONDANSETRON 4 MG PO TBDP: 4.0000 mg | ORAL_TABLET | Freq: Four times a day (QID) | ORAL | Status: DC | PRN

## 2016-02-14 MED ORDER — PANTOPRAZOLE SODIUM 40 MG PO TBEC
40.0000 mg | DELAYED_RELEASE_TABLET | Freq: Two times a day (BID) | ORAL | Status: DC
  Administered 2016-02-14 – 2016-02-15 (×2): 40 mg via ORAL
  Filled 2016-02-14 (×2): qty 1

## 2016-02-14 MED ORDER — IBUPROFEN 200 MG PO TABS
400.0000 mg | ORAL_TABLET | Freq: Once | ORAL | Status: AC
  Administered 2016-02-14: 400 mg via ORAL
  Filled 2016-02-14: qty 2

## 2016-02-14 MED ORDER — HYDROXYZINE HCL 25 MG PO TABS: 25.0000 mg | ORAL_TABLET | Freq: Four times a day (QID) | ORAL | Status: DC | PRN
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
## 2016-02-14 NOTE — ED Provider Notes (Signed)
Patient care signed off at shift change by previous provider pending clinical sobriety and TTS consult. Upon my evaluation patient seemed slightly intoxicated, requesting food and drink. Patient was given food. Upon subsequent evaluation patient appeared to be clinically sober, TTS consult. Patient is medically cleared at this time. Pt transferred to [facility] in stable condition.   Okey Regal, PA-C 02/14/16 [place], MD 02/23/16 [phone]

## 2016-02-14 NOTE — ED Notes (Signed)
Lolita Rieger, RN and [place], [place] both made aware of critical ETOH of 331.

## 2016-02-14 NOTE — ED Notes (Signed)
Patient noted in room asleep. No complaints, stable, in no acute distress. Q15 minute rounds and monitoring via security cameras continue for safety.

## 2016-02-14 NOTE — ED Notes (Signed)
TTS at bedside. 
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
## 2016-02-14 NOTE — ED Notes (Signed)
Patient voided without giving urine sample, made aware we needed sample next time. Patient verbalized not understanding why she was here, after PA Merry Proud went in to talk to her.  Explained situation again, told her breakfast would be provided and offered something to drink. Ginger ale and pillow given. Patient is calm and cooperative at this time.

## 2016-02-14 NOTE — ED Notes (Signed)
Made Merry Proud PA aware that patient is requesting pain meds for her right hand and that she would also like to see him.   PA at bedside.
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
## 2016-02-14 NOTE — ED Notes (Signed)
Patient used restroom, but wasn't able to get enough urine to send down for specimen.
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
## 2016-02-14 NOTE — ED Notes (Signed)
Bed: [id number] Expected date:  Expected time:  Means of arrival:  Comments: IVC

## 2016-02-14 NOTE — BH Assessment (Addendum)
Clinician attempted to reach pt's son, Linton Rump at [phone] but he did not pick up. Clinician left a message asking him to call back for collateral information.  Ruben Reason, MA, [facility], [facility]

## 2016-02-15 DIAGNOSIS — F1094 Alcohol use, unspecified with alcohol-induced mood disorder: Secondary | ICD-10-CM

## 2016-02-15 MED ORDER — COENZYME Q10 30 MG PO CAPS: 30.0000 mg | ORAL_CAPSULE | Freq: Every day | ORAL | Status: DC

## 2016-02-15 MED ORDER — TRAZODONE HCL 100 MG PO TABS: 100.0000 mg | ORAL_TABLET | Freq: Every day | ORAL | Status: DC

## 2016-02-15 MED ORDER — ASPIRIN 325 MG PO TABS
325.0000 mg | ORAL_TABLET | ORAL | Status: DC
  Administered 2016-02-15: 325 mg via ORAL
  Filled 2016-02-15: qty 1

## 2016-02-15 NOTE — ED Notes (Signed)
Pt told that she is going to be discharged home,  she cupped her face in her hands and started crying, "I am so relieved."
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
## 2016-02-15 NOTE — ED Notes (Signed)
Pt discharged from [facility].  Discharge AVS given, belongings returned and bus passes given for ride home.  Pt states that she does not want to bother her family and plans to go stay with her sister for a couple days.

## 2016-02-15 NOTE — [facility] Suicide Risk Assessment (Signed)
Suicide Risk Assessment  Discharge Assessment   [facility] Discharge Suicide Risk Assessment   Principal Problem: Alcohol-induced mood disorder [facility]) Discharge Diagnoses:  Patient Active Problem List   Diagnosis Date Noted  . Alcohol-induced mood disorder ([place]) [F10.94] 11/03/2015    Priority: High  . Alcohol use disorder, severe, dependence ([place]) [F10.20] 10/03/2015    Priority: High  . Alcohol abuse [F10.10]   . Major psychotic depression, recurrent ([place]) [F33.3] 10/03/2015  . Ankle edema [R60.9] 03/17/2015  . Cirrhosis ([place]) [K74.60] 02/24/2015  . Black stools [K92.1] 02/24/2015  . Abdominal pain, epigastric [R10.13] 01/29/2015  . Nausea with vomiting [R11.2] 01/29/2015  . Ulcerative esophagitis [K22.10] 01/29/2015  . Dysphagia, pharyngoesophageal phase [R13.14] 01/29/2015  . Hypokalemia [E87.6] 12/19/2014  . Hypomagnesemia [E83.42] 12/19/2014  . Malnutrition of moderate degree ([place]) [E44.0] 12/18/2014  . Alcoholic ketoacidosis [id number] 12/17/2014  . Alcohol abuse, in remission [F10.10] 02/14/2014  . Essential hypertension, benign [I10] 02/14/2014  . Smoking [Z72.0] 02/14/2014  . Upper GI bleed [K92.2] 02/06/2014  . Chronic pain syndrome [G89.4] 02/06/2014  . Gastro-esophageal reflux [K21.9] 02/06/2014  . Dental caries [K02.9] 01/17/2014  . Periprosthetic fracture of hip ([place]) [id number].[place], [place] 05/31/2013  . Hip fracture ([place]) [S72.009A] 05/27/2013  . Acute blood loss anemia [D62] 04/11/2013  . S/P right THA, AA [Z96.60] 04/10/2013    Total Time spent with patient: 45 minutes  Musculoskeletal: Strength & Muscle Tone: within normal limits Gait & Station: normal Patient leans: N/A  Psychiatric Specialty Exam: Physical Exam  Constitutional: She is oriented to person, place, and time. She appears well-developed and well-nourished.  HENT:  Head: Normocephalic.  Neck: Normal range of motion.  Respiratory: Effort normal.  Musculoskeletal: Normal range of motion.   Neurological: She is alert and oriented to person, place, and time.  Skin: Skin is warm and dry.  Psychiatric: She has a normal mood and affect. Her speech is normal and behavior is normal. Judgment and thought content normal. Cognition and memory are normal.    Review of Systems  Constitutional: Negative.   HENT: Negative.   Eyes: Negative.   Respiratory: Negative.   Cardiovascular: Negative.   Gastrointestinal: Negative.   Genitourinary: Negative.   Musculoskeletal: Negative.   Skin: Negative.   Neurological: Negative.   Endo/Heme/Allergies: Negative.   Psychiatric/Behavioral: Positive for substance abuse.    Blood pressure 148/84, pulse 81, temperature 98.1 F (36.7 C), temperature source Oral, resp. rate 18, SpO2 95 %.There is no weight on file to calculate BMI.  General Appearance: Casual  Eye Contact:  Good  Speech:  Normal Rate  Volume:  Normal  Mood:  Euthymic  Affect:  Congruent  Thought Process:  Coherent and Descriptions of Associations: Intact  Orientation:  Full (Time, Place, and Person)  Thought Content:  WDL  Suicidal Thoughts:  No  Homicidal Thoughts:  No  Memory:  Immediate;   Good Recent;   Good Remote;   Good  Judgement:  Fair  Insight:  Fair  Psychomotor Activity:  Normal  Concentration:  Concentration: Good and Attention Span: Good  Recall:  Good  Fund of Knowledge:  Good  Language:  Good  Akathisia:  No  Handed:  Right  AIMS (if indicated):     Assets:  Housing Leisure Time Physical Health Resilience Social Support  ADL's:  Intact  Cognition:  WNL  Sleep:       Mental Status Per Nursing Assessment::   On Admission:   alcohol abuse with homicidal ideations  Demographic Factors:  NA  Loss Factors: NA  Historical Factors: NA  Risk Reduction Factors:   Sense of responsibility to family, Living with another person, especially a relative and Positive social support  Continued Clinical Symptoms:  None   Cognitive Features That  Contribute To Risk:  None    Suicide Risk:  Minimal: No identifiable suicidal ideation.  Patients presenting with no risk factors but with morbid ruminations; may be classified as minimal risk based on the severity of the depressive symptoms    Plan Of Care/Follow-up recommendations:  Activity:  as tolerated Diet:  heart healthy diet  Azaleah Usman, NP 02/15/2016, 10:38 AM

## 2016-02-19 ENCOUNTER — Telehealth: Payer: Self-pay

## 2016-02-19 NOTE — Telephone Encounter (Signed)
-----   Message from Algernon Huxley, RN sent at 01/07/2016  4:44 PM EDT ----- Regarding: FW: [place] due in July  ----- Message -----    From: Algernon Huxley, RN    Sent: 01/05/2016      To: Algernon Huxley, RN Subject: [place]                                             Pt needs [place] in June

## 2016-02-19 NOTE — Telephone Encounter (Signed)
Called pt to schedule [address] [place]. Pt states she will call back to schedule this when she can make sure she will have transportation.

## 2016-04-12 ENCOUNTER — Emergency Department (HOSPITAL_COMMUNITY)
Admission: EM | Admit: 2016-04-12 | Discharge: 2016-04-12 | Disposition: A | Discharge status: Home / Self Care | Payer: Medicaid Other | Attending: Emergency Medicine | Admitting: Emergency Medicine

## 2016-04-12 ENCOUNTER — Encounter (HOSPITAL_COMMUNITY): Payer: Self-pay | Admitting: Emergency Medicine

## 2016-04-12 DIAGNOSIS — F1014 Alcohol abuse with alcohol-induced mood disorder: Secondary | ICD-10-CM | POA: Insufficient documentation

## 2016-04-12 DIAGNOSIS — R45851 Suicidal ideations: Secondary | ICD-10-CM | POA: Diagnosis present

## 2016-04-12 DIAGNOSIS — Z791 Long term (current) use of non-steroidal anti-inflammatories (NSAID): Secondary | ICD-10-CM | POA: Diagnosis not present

## 2016-04-12 DIAGNOSIS — F10920 Alcohol use, unspecified with intoxication, uncomplicated: Secondary | ICD-10-CM

## 2016-04-12 DIAGNOSIS — F17210 Nicotine dependence, cigarettes, uncomplicated: Secondary | ICD-10-CM | POA: Diagnosis not present

## 2016-04-12 DIAGNOSIS — X58XXXA Exposure to other specified factors, initial encounter: Secondary | ICD-10-CM | POA: Diagnosis not present

## 2016-04-12 DIAGNOSIS — S5011XA Contusion of right forearm, initial encounter: Secondary | ICD-10-CM | POA: Diagnosis not present

## 2016-04-12 DIAGNOSIS — Z79899 Other long term (current) drug therapy: Secondary | ICD-10-CM | POA: Diagnosis not present

## 2016-04-12 DIAGNOSIS — I10 Essential (primary) hypertension: Secondary | ICD-10-CM | POA: Diagnosis not present

## 2016-04-12 DIAGNOSIS — Y939 Activity, unspecified: Secondary | ICD-10-CM | POA: Insufficient documentation

## 2016-04-12 DIAGNOSIS — Y929 Unspecified place or not applicable: Secondary | ICD-10-CM | POA: Diagnosis not present

## 2016-04-12 DIAGNOSIS — Y999 Unspecified external cause status: Secondary | ICD-10-CM | POA: Insufficient documentation

## 2016-04-12 LAB — CBC WITH DIFFERENTIAL/PLATELET
BASOS PCT: 0 %
Basophils Absolute: 0 10*3/uL (ref 0.0–0.1)
Eosinophils Absolute: 0 10*3/uL (ref 0.0–0.7)
Eosinophils Relative: 0 %
HEMATOCRIT: 33.5 % — AB (ref 36.0–46.0)
HEMOGLOBIN: 11.3 g/dL — AB (ref 12.0–15.0)
LYMPHS ABS: 3.4 10*3/uL (ref 0.7–4.0)
LYMPHS PCT: 50 %
MCH: 29.4 pg (ref 26.0–34.0)
MCHC: 33.7 g/dL (ref 30.0–36.0)
MCV: 87.2 fL (ref 78.0–100.0)
MONO ABS: 0.4 10*3/uL (ref 0.1–1.0)
MONOS PCT: 5 %
NEUTROS ABS: 3.1 10*3/uL (ref 1.7–7.7)
Neutrophils Relative %: 45 %
Platelets: 210 10*3/uL (ref 150–400)
RBC: 3.84 MIL/uL — ABNORMAL LOW (ref 3.87–5.11)
RDW: 18.9 % — AB (ref 11.5–15.5)
WBC: 6.9 10*3/uL (ref 4.0–10.5)

## 2016-04-12 LAB — BASIC METABOLIC PANEL
Anion gap: 12 (ref 5–15)
BUN: 9 mg/dL (ref 6–20)
CALCIUM: 8.7 mg/dL — AB (ref 8.9–10.3)
CHLORIDE: 105 mmol/L (ref 101–111)
CO2: 25 mmol/L (ref 22–32)
CREATININE: 0.49 mg/dL (ref 0.44–1.00)
GFR calc Af Amer: >60 mL/min (ref >60)
GFR calc non Af Amer: >60 mL/min (ref >60)
GLUCOSE: 89 mg/dL (ref 65–99)
Potassium: 3.7 mmol/L (ref 3.5–5.1)
Sodium: 142 mmol/L (ref 135–145)

## 2016-04-12 LAB — RAPID URINE DRUG SCREEN, HOSP PERFORMED
Amphetamines: NOT DETECTED
BARBITURATES: NOT DETECTED
Benzodiazepines: NOT DETECTED
Cocaine: NOT DETECTED
Opiates: NOT DETECTED
TETRAHYDROCANNABINOL: NOT DETECTED

## 2016-04-12 LAB — ETHANOL: ALCOHOL ETHYL (B): 308 mg/dL — AB (ref <5)
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
## 2016-04-12 NOTE — ED Notes (Addendum)
Dr. Dina Rich and Loma Sousa, RN notified of critical ETOH 308.
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
## 2016-04-12 NOTE — ED Notes (Signed)
Bed: WTR5 Expected date:  Expected time:  Means of arrival:  Comments: 
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
## 2016-05-16 IMAGING — CR DG CHEST 2V
2 series · 2 of 2 positions shown · non-contrast
Comparison: Prior radiograph from 05/29/2013

CLINICAL DATA: Chest pain

EXAM:
CHEST  2 VIEW

[w chest pa]
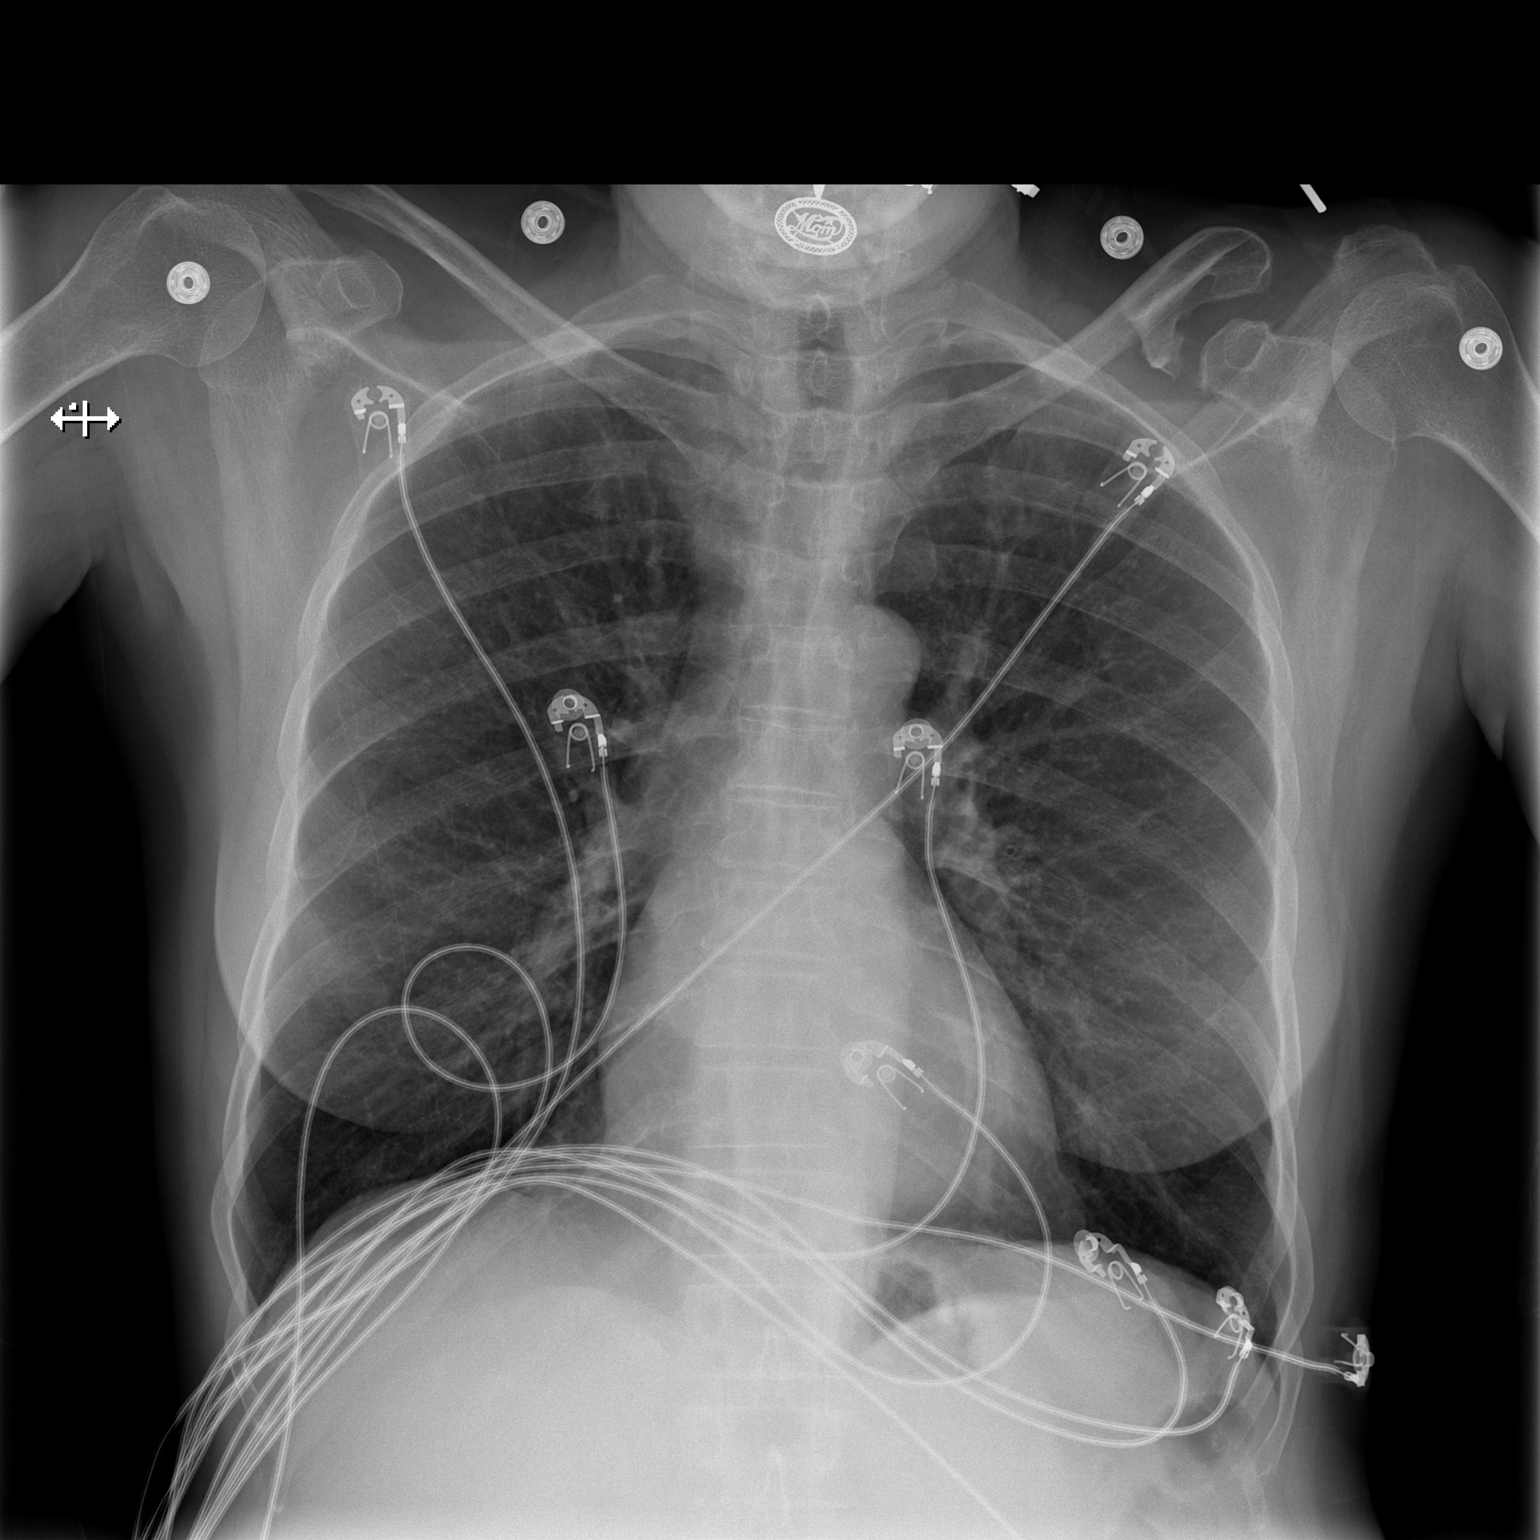

[w chest lat]
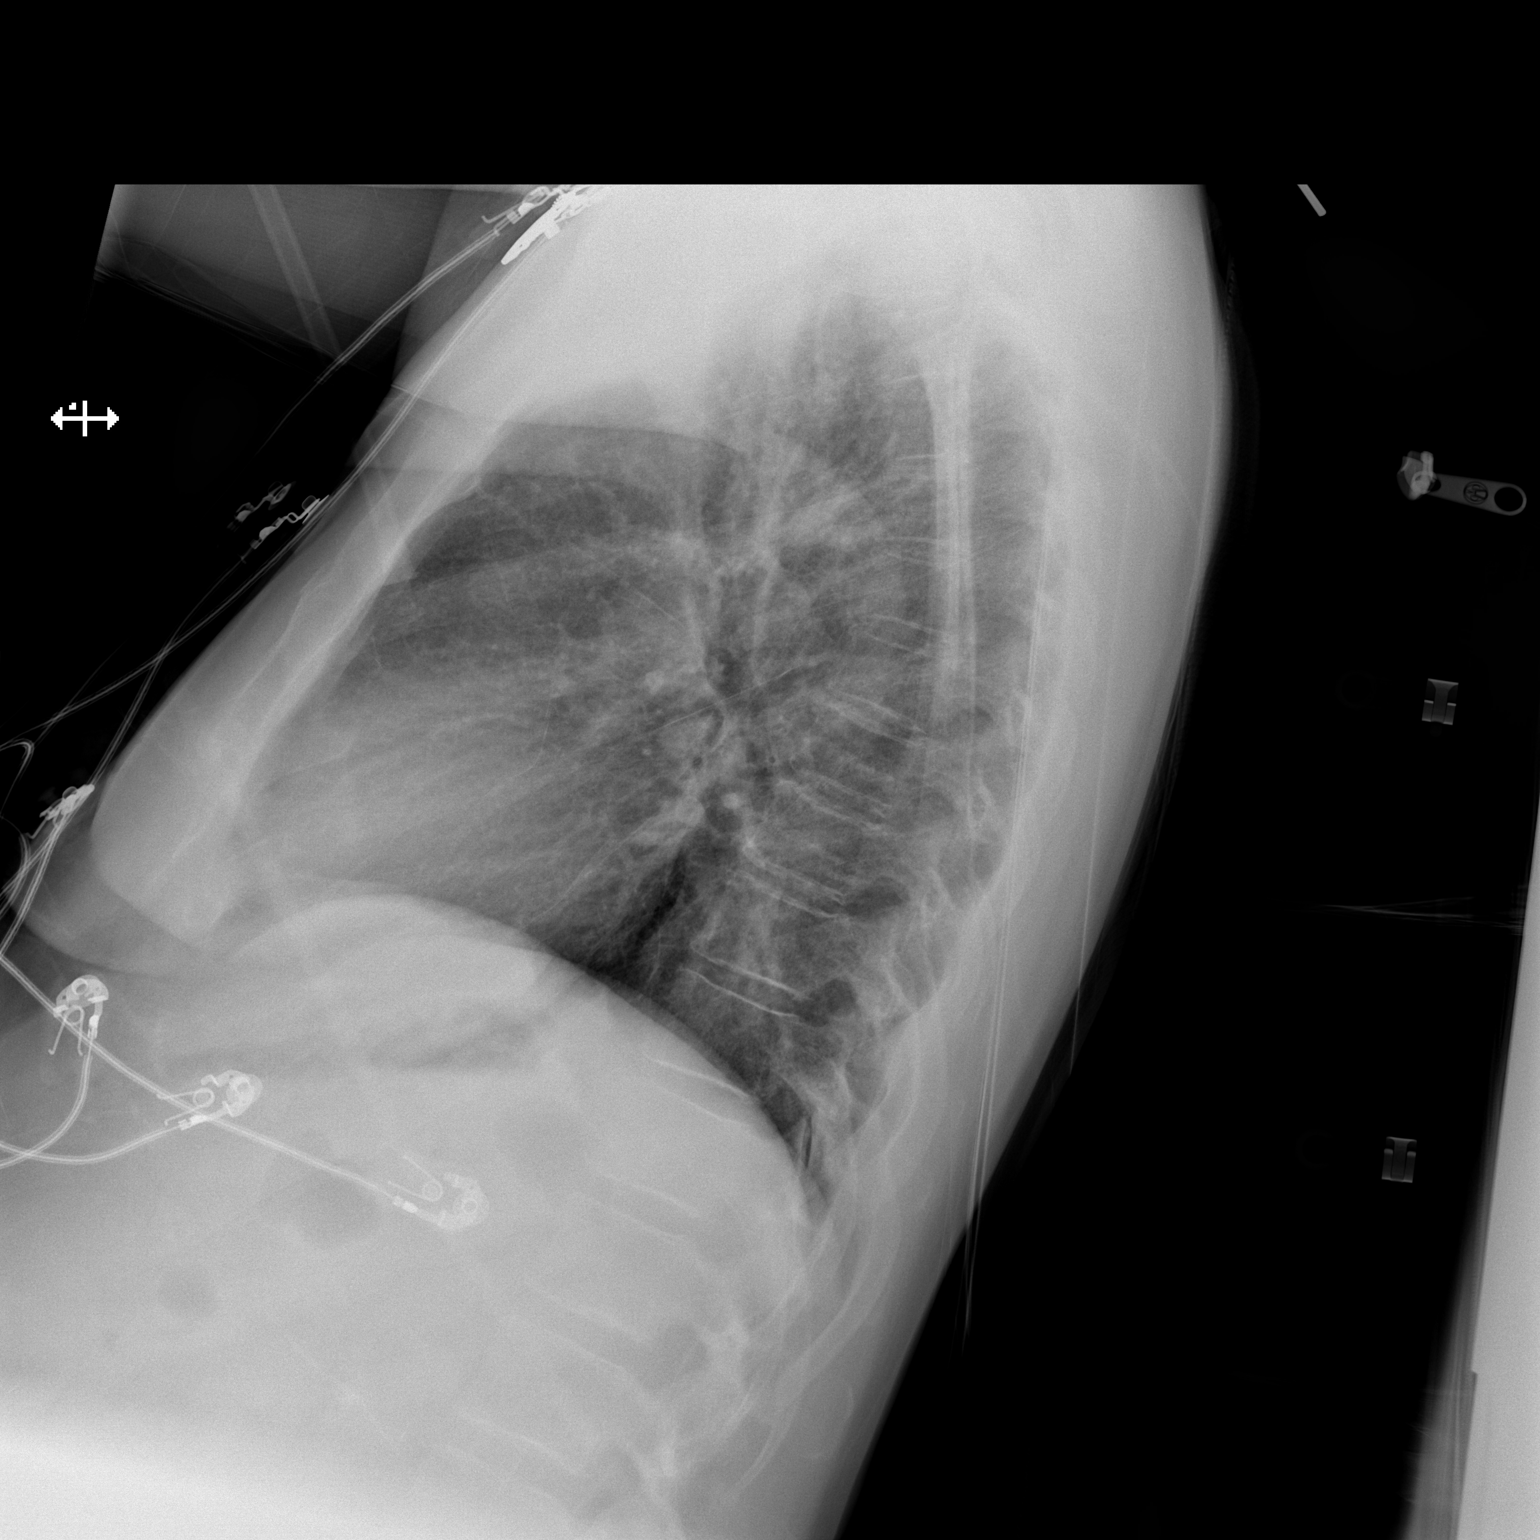

[2 of 2 positions shown; findings below may reference images not displayed]

FINDINGS: The cardiac and mediastinal silhouettes are stable in size and
contour, and remain within normal limits.

The lungs are normally inflated. No airspace consolidation, pleural
effusion, or pulmonary edema is identified. There is no
pneumothorax.

No acute osseous abnormality identified. Deformity of the distal
left clavicle is stable.
IMPRESSION: No active cardiopulmonary disease.

## 2016-08-07 ENCOUNTER — Inpatient Hospital Stay (HOSPITAL_COMMUNITY)
Admission: EM | Admit: 2016-08-07 | Discharge: 2016-08-12 | DRG: 493 | Disposition: A | Discharge status: Home Health | Payer: Medicaid Other | Attending: Internal Medicine | Admitting: Internal Medicine

## 2016-08-07 ENCOUNTER — Emergency Department (HOSPITAL_COMMUNITY): Payer: Medicaid Other

## 2016-08-07 ENCOUNTER — Encounter (HOSPITAL_COMMUNITY): Payer: Self-pay

## 2016-08-07 DIAGNOSIS — E871 Hypo-osmolality and hyponatremia: Secondary | ICD-10-CM | POA: Diagnosis present

## 2016-08-07 DIAGNOSIS — G894 Chronic pain syndrome: Secondary | ICD-10-CM | POA: Diagnosis present

## 2016-08-07 DIAGNOSIS — Z7982 Long term (current) use of aspirin: Secondary | ICD-10-CM

## 2016-08-07 DIAGNOSIS — M2170 Unequal limb length (acquired), unspecified site: Secondary | ICD-10-CM | POA: Diagnosis present

## 2016-08-07 DIAGNOSIS — R072 Precordial pain: Secondary | ICD-10-CM

## 2016-08-07 DIAGNOSIS — S42201A Unspecified fracture of upper end of right humerus, initial encounter for closed fracture: Secondary | ICD-10-CM

## 2016-08-07 DIAGNOSIS — R05 Cough: Secondary | ICD-10-CM

## 2016-08-07 DIAGNOSIS — S42211A Unspecified displaced fracture of surgical neck of right humerus, initial encounter for closed fracture: Principal | ICD-10-CM | POA: Diagnosis present

## 2016-08-07 DIAGNOSIS — W19XXXA Unspecified fall, initial encounter: Secondary | ICD-10-CM

## 2016-08-07 DIAGNOSIS — R059 Cough, unspecified: Secondary | ICD-10-CM

## 2016-08-07 DIAGNOSIS — R079 Chest pain, unspecified: Secondary | ICD-10-CM

## 2016-08-07 DIAGNOSIS — D649 Anemia, unspecified: Secondary | ICD-10-CM | POA: Diagnosis present

## 2016-08-07 DIAGNOSIS — Z79899 Other long term (current) drug therapy: Secondary | ICD-10-CM

## 2016-08-07 DIAGNOSIS — K7030 Alcoholic cirrhosis of liver without ascites: Secondary | ICD-10-CM | POA: Diagnosis present

## 2016-08-07 DIAGNOSIS — K7460 Unspecified cirrhosis of liver: Secondary | ICD-10-CM | POA: Diagnosis present

## 2016-08-07 DIAGNOSIS — S42291A Other displaced fracture of upper end of right humerus, initial encounter for closed fracture: Secondary | ICD-10-CM | POA: Diagnosis present

## 2016-08-07 DIAGNOSIS — Z419 Encounter for procedure for purposes other than remedying health state, unspecified: Secondary | ICD-10-CM

## 2016-08-07 DIAGNOSIS — Z8711 Personal history of peptic ulcer disease: Secondary | ICD-10-CM

## 2016-08-07 DIAGNOSIS — Y92009 Unspecified place in unspecified non-institutional (private) residence as the place of occurrence of the external cause: Secondary | ICD-10-CM

## 2016-08-07 DIAGNOSIS — E876 Hypokalemia: Secondary | ICD-10-CM | POA: Diagnosis present

## 2016-08-07 DIAGNOSIS — D62 Acute posthemorrhagic anemia: Secondary | ICD-10-CM | POA: Diagnosis not present

## 2016-08-07 DIAGNOSIS — R531 Weakness: Secondary | ICD-10-CM

## 2016-08-07 DIAGNOSIS — F17210 Nicotine dependence, cigarettes, uncomplicated: Secondary | ICD-10-CM | POA: Diagnosis present

## 2016-08-07 DIAGNOSIS — Z96643 Presence of artificial hip joint, bilateral: Secondary | ICD-10-CM | POA: Diagnosis present

## 2016-08-07 DIAGNOSIS — K210 Gastro-esophageal reflux disease with esophagitis: Secondary | ICD-10-CM | POA: Diagnosis present

## 2016-08-07 DIAGNOSIS — J069 Acute upper respiratory infection, unspecified: Secondary | ICD-10-CM | POA: Diagnosis present

## 2016-08-07 DIAGNOSIS — R091 Pleurisy: Secondary | ICD-10-CM | POA: Diagnosis present

## 2016-08-07 DIAGNOSIS — R509 Fever, unspecified: Secondary | ICD-10-CM

## 2016-08-07 DIAGNOSIS — S42209A Unspecified fracture of upper end of unspecified humerus, initial encounter for closed fracture: Secondary | ICD-10-CM

## 2016-08-07 DIAGNOSIS — W1830XA Fall on same level, unspecified, initial encounter: Secondary | ICD-10-CM | POA: Diagnosis present

## 2016-08-07 DIAGNOSIS — K219 Gastro-esophageal reflux disease without esophagitis: Secondary | ICD-10-CM | POA: Diagnosis present

## 2016-08-07 DIAGNOSIS — F329 Major depressive disorder, single episode, unspecified: Secondary | ICD-10-CM | POA: Diagnosis present

## 2016-08-07 DIAGNOSIS — F1020 Alcohol dependence, uncomplicated: Secondary | ICD-10-CM | POA: Diagnosis present

## 2016-08-07 LAB — URINALYSIS, ROUTINE W REFLEX MICROSCOPIC
Glucose, UA: NEGATIVE mg/dL
Hgb urine dipstick: NEGATIVE
KETONES UR: 5 mg/dL — AB
Nitrite: NEGATIVE
PROTEIN: 100 mg/dL — AB
Specific Gravity, Urine: 1.03 (ref 1.005–1.030)
pH: 5 (ref 5.0–8.0)

## 2016-08-07 LAB — CBC
HEMATOCRIT: 33.3 % — AB (ref 36.0–46.0)
HEMOGLOBIN: 11.8 g/dL — AB (ref 12.0–15.0)
MCH: 34.4 pg — AB (ref 26.0–34.0)
MCHC: 35.4 g/dL (ref 30.0–36.0)
MCV: 97.1 fL (ref 78.0–100.0)
Platelets: 165 10*3/uL (ref 150–400)
RBC: 3.43 MIL/uL — AB (ref 3.87–5.11)
RDW: 13.4 % (ref 11.5–15.5)
WBC: 13.1 10*3/uL — ABNORMAL HIGH (ref 4.0–10.5)

## 2016-08-07 LAB — BASIC METABOLIC PANEL
ANION GAP: 16 — AB (ref 5–15)
BUN: 11 mg/dL (ref 6–20)
CHLORIDE: 93 mmol/L — AB (ref 101–111)
CO2: 24 mmol/L (ref 22–32)
Calcium: 9.3 mg/dL (ref 8.9–10.3)
Creatinine, Ser: 0.72 mg/dL (ref 0.44–1.00)
GFR calc non Af Amer: >60 mL/min (ref >60)
Glucose, Bld: 125 mg/dL — ABNORMAL HIGH (ref 65–99)
Potassium: 3.7 mmol/L (ref 3.5–5.1)
Sodium: 133 mmol/L — ABNORMAL LOW (ref 135–145)

## 2016-08-07 LAB — HEPATIC FUNCTION PANEL
ALBUMIN: 4.6 g/dL (ref 3.5–5.0)
ALK PHOS: 88 U/L (ref 38–126)
ALT: 28 U/L (ref 14–54)
AST: 71 U/L — AB (ref 15–41)
BILIRUBIN TOTAL: 1.8 mg/dL — AB (ref 0.3–1.2)
Bilirubin, Direct: 0.4 mg/dL (ref 0.1–0.5)
Indirect Bilirubin: 1.4 mg/dL — ABNORMAL HIGH (ref 0.3–0.9)
Total Protein: 7.7 g/dL (ref 6.5–8.1)

## 2016-08-07 LAB — I-STAT TROPONIN, ED: Troponin i, poc: 0.03 ng/mL (ref 0.00–0.08)

## 2016-08-07 MED ORDER — DEXTROSE 5 % IV SOLN
1.0000 g | Freq: Once | INTRAVENOUS | Status: AC
  Administered 2016-08-07: 1 g via INTRAVENOUS
  Filled 2016-08-07: qty 10

## 2016-08-07 MED ORDER — ONDANSETRON HCL 4 MG/2ML IJ SOLN
4.0000 mg | Freq: Once | INTRAMUSCULAR | Status: AC
  Administered 2016-08-07: 4 mg via INTRAVENOUS
  Filled 2016-08-07: qty 2

## 2016-08-07 MED ORDER — HYDROMORPHONE HCL 2 MG/ML IJ SOLN
1.0000 mg | Freq: Once | INTRAMUSCULAR | Status: AC
  Administered 2016-08-07: 1 mg via INTRAVENOUS

## 2016-08-07 MED ORDER — SODIUM CHLORIDE 0.9 % IV BOLUS (SEPSIS)
500.0000 mL | Freq: Once | INTRAVENOUS | Status: AC
  Administered 2016-08-07: 500 mL via INTRAVENOUS

## 2016-08-07 MED ORDER — DEXTROSE 5 % IV SOLN
500.0000 mg | Freq: Once | INTRAVENOUS | Status: AC
  Administered 2016-08-08: 500 mg via INTRAVENOUS
  Filled 2016-08-07: qty 500

## 2016-08-07 MED ORDER — SODIUM CHLORIDE 0.9 % IV SOLN: INTRAVENOUS | Status: DC

## 2016-08-07 MED ORDER — ACETAMINOPHEN 500 MG PO TABS
1000.0000 mg | ORAL_TABLET | Freq: Once | ORAL | Status: AC
  Administered 2016-08-07: 1000 mg via ORAL
  Filled 2016-08-07: qty 2

## 2016-08-07 MED ORDER — SODIUM CHLORIDE 0.9 % IV BOLUS (SEPSIS)
1000.0000 mL | Freq: Once | INTRAVENOUS | Status: AC
  Administered 2016-08-07: 1000 mL via INTRAVENOUS

## 2016-08-07 MED ORDER — HYDROMORPHONE HCL 2 MG/ML IJ SOLN
1.0000 mg | Freq: Once | INTRAMUSCULAR | Status: DC
  Filled 2016-08-07: qty 1

## 2016-08-07 NOTE — ED Notes (Signed)
Sent label to main lab to add on HFP 

## 2016-08-07 NOTE — ED Triage Notes (Signed)
Pt reports hip pian, leg pain, arm pain, chest pain and fingers "twisting" for 2 days. sts she did fall recently and then the chest pain came also and thought it may be a heart attack.

## 2016-08-07 NOTE — ED Provider Notes (Addendum)
[facility] DEPT Provider Note   CSN: [id number] Arrival date & time: 08/07/16  1847     History   Chief Complaint Chief Complaint  Patient presents with  . Leg Pain  . Arm Pain  . Fall    HPI Jaime Phillips is a 56 y.o. female.  Patient c/o fall yesterday at home, and states today generally hasnt felt well, and is having persistent pain in right shoulder.  Right shoulder pain constant, dull, mod-severe, worse w movement. No associated arm numbness/weakness. Denies head injury or loc.  States hx prior lower ext joint surgery, and periodically a leg will just give way on her causing her to fall. Also states has felt generally weak in past 1-2 days.  Denies syncope. Patient also c/o on and off mid chest pain in past day. At rest. Lasts several minutes at a time. Denies specific exacerbating or alleviating factors. Not pleuritic. Located mid sternal.  Denies hx cad. +smoker. Also notes a 'bad cold' in past week with prod cough, nasal congestion, scratchy throat, and fever.Denies leg pain or swelling. Denies neck or back pain.    The history is provided by the patient.  Chest Pain   Associated symptoms include cough, a fever and leg pain. Pertinent negatives include no abdominal pain, no back pain, no headaches, no shortness of breath and no vomiting.  Leg Pain    Arm Pain  Pertinent negatives include no abdominal pain, no headaches and no shortness of breath.  Fall  Pertinent negatives include no abdominal pain, no headaches and no shortness of breath.    Past Medical History:  Diagnosis Date  . Abnormal uterine bleeding   . Alcoholism ([place])   . Arthritis   . Chronic pain syndrome   . Depression   . Diverticulosis   . Headache(784.0)   . Hiatal hernia   . Hyperplastic colon polyp   . Hypopotassemia   . Internal hemorrhoids   . LFT elevation   . Macrocytic anemia   . Mallory-Weiss tear   . Potassium (K) deficiency   . PUD (peptic ulcer disease)   . Rectal bleeding      minor     Patient Active Problem List   Diagnosis Date Noted  . 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 Alcohol abuse   . Alcohol use disorder, severe, dependence ([place]) 10/03/2015  . Major psychotic depression, recurrent ([place]) 10/03/2015  . Ankle edema 03/17/2015  . Cirrhosis ([place]) 02/24/2015  . Black stools 02/24/2015  . Abdominal pain, epigastric 01/29/2015  . Nausea with vomiting 01/29/2015  . 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 Dysphagia, pharyngoesophageal phase 01/29/2015  . Hypokalemia 12/19/2014  . Hypomagnesemia 12/19/2014  . Malnutrition of moderate degree ([place]) 12/18/2014  . Alcoholic ketoacidosis [id number]  . Alcohol abuse, in remission 02/14/2014  . Essential hypertension, benign 02/14/2014  . Smoking 02/14/2014  . Upper GI bleed 02/06/2014  . Chronic pain syndrome 02/06/2014  . Gastro-esophageal reflux 02/06/2014  . Dental caries 01/17/2014  . Periprosthetic fracture of hip 05/31/2013  . Hip fracture ([place]) 05/27/2013  . Acute blood loss anemia 04/11/2013  . S/P right THA, AA 04/10/2013    Past Surgical History:  Procedure Laterality Date  . APPENDECTOMY    . COLONOSCOPY    . ESOPHAGOGASTRODUODENOSCOPY N/A 02/06/2014   Procedure: ESOPHAGOGASTRODUODENOSCOPY (EGD);  Surgeon: Milus Banister, MD;  Location: [facility];  Service: Endoscopy;  Laterality: N/A;  . OOPHORECTOMY    . ORIF PERIPROSTHETIC FRACTURE Right 05/28/2013   Procedure: OPEN REDUCTION  INTERNAL FIXATION (ORIF) PERIPROSTHETIC FRACTURE;  Surgeon: Mauri Pole, MD;  Location: [facility];  Service: Orthopedics;  Laterality: Right;  . SHOULDER SURGERY Left   . TOTAL HIP ARTHROPLASTY Right 04/10/2013   Procedure: RIGHT TOTAL HIP ARTHROPLASTY ANTERIOR APPROACH;  Surgeon: Mauri Pole, MD;  Location: [facility];  Service: Orthopedics;  Laterality: Right;  . TOTAL HIP ARTHROPLASTY     left hip    OB History    Gravida Para Term Preterm AB Living   2 1 1   1 1    SAB TAB Ectopic Multiple Live  Births   1               Home Medications    Prior to Admission medications   Medication Sig Start Date End Date Taking? Authorizing Provider  ibuprofen (ADVIL,MOTRIN) 200 MG tablet Take 400 mg by mouth every 6 (six) hours as needed for moderate pain.    Historical Provider, MD  ibuprofen (ADVIL,MOTRIN) 800 MG tablet Take 1 tablet (800 mg total) by mouth every 8 (eight) hours as needed for headache, mild pain or moderate pain. Patient not taking: Reported on 02/14/2016 11/07/15   Encarnacion Slates, NP  pantoprazole (PROTONIX) 40 MG tablet Take 1 tablet (40 mg total) by mouth daily. For acid reflux Patient not taking: Reported on 04/12/2016 11/26/15   Jerene Bears, MD  thiamine (VITAMIN B-1) 100 MG tablet Take 1 tablet (100 mg total) by mouth daily. For low thiamine Patient not taking: Reported on 04/12/2016 11/07/15   Encarnacion Slates, NP  traMADol (ULTRAM) 50 MG tablet Take 1 tablet (50 mg total) by mouth every 6 (six) hours as needed. Patient not taking: Reported on 02/14/2016 12/04/15   Gareth Morgan, MD  traZODone (DESYREL) 100 MG tablet Take 1 tablet (100 mg) at bedtime: For sleep Patient not taking: Reported on 04/12/2016 11/07/15   Encarnacion Slates, NP    Family History Family History  Problem Relation Age of Onset  . Breast cancer Sister   . Diabetes Mother   . Kidney disease Mother   . Heart disease Father   . Colon cancer Father     questionable    Social History Social History  Substance Use Topics  . Smoking status: Current Every Day Smoker    Packs/day: 0.50    Years: 25.00    Types: Cigarettes  . Smokeless tobacco: Never Used     Comment: Tobacco info given to pt. 08/16/12  . Alcohol use 1.2 - 1.8 oz/week    2 - 3 Shots of liquor per week     Comment: pt quit drinking 2 months ago (today is 03/11/15)     Allergies   Morphine and related   Review of Systems Review of Systems  Constitutional: Positive for fever.  HENT: Positive for congestion and sore throat.   Eyes:  Negative for pain and redness.  Respiratory: Positive for cough. Negative for shortness of breath.   Cardiovascular: Negative for leg swelling.  Gastrointestinal: Negative for abdominal pain, diarrhea and vomiting.  Genitourinary: Negative for dysuria and flank pain.  Musculoskeletal: Negative for back pain and neck pain.  Skin: Negative for rash.  Neurological: Negative for headaches.  Hematological: Does not bruise/bleed easily.  Psychiatric/Behavioral: Negative for confusion.     Physical Exam Updated Vital Signs BP 119/83   Pulse (!) 133   Temp 100 F (37.8 C)   Resp 20   Ht 5\' 3"  (1.6 m)   Wt 49.9  kg   SpO2 100%   BMI 19.49 kg/m   Physical Exam  Constitutional: She appears well-developed and well-nourished. No distress.  HENT:  Mouth/Throat: Oropharynx is clear and moist.  Eyes: Conjunctivae are normal. Pupils are equal, round, and reactive to light. No scleral icterus.  Neck: Neck supple. No tracheal deviation present.  No stiffness or rigidity  Cardiovascular: Normal rate, normal heart sounds and intact distal pulses.  Exam reveals no friction rub.   No murmur heard. Tachycardic.   Pulmonary/Chest: Effort normal and breath sounds normal. No respiratory distress.  +chest wall tenderness bil, no crepitus.   Abdominal: Soft. Normal appearance and bowel sounds are normal. She exhibits no distension. There is no tenderness.  Genitourinary:  Genitourinary Comments: No cva tenderness  Musculoskeletal: She exhibits no edema.  CTLS spine, non tender, aligned, no step off. Tenderness and soft tissue swelling to proximal right humerus and shoulder.  Radial pulse 2+. Skin is intact.   Lymphadenopathy:    She has no cervical adenopathy.  Neurological: She is alert.  Skin: Skin is warm and dry. No rash noted. She is not diaphoretic.  Psychiatric: She has a normal mood and affect.  Nursing note and vitals reviewed.    ED Treatments / Results  Labs (all labs ordered are  listed, but only abnormal results are displayed) Results for orders placed or performed during the hospital encounter of [id number]  Basic metabolic panel  Result Value Ref Range   Sodium 133 (L) 135 - 145 mmol/L   Potassium 3.7 3.5 - 5.1 mmol/L   Chloride 93 (L) 101 - 111 mmol/L   CO2 24 22 - 32 mmol/L   Glucose, Bld 125 (H) 65 - 99 mg/dL   BUN 11 6 - 20 mg/dL   Creatinine, Ser 0.72 0.44 - 1.00 mg/dL   Calcium 9.3 8.9 - 10.3 mg/dL   GFR calc non Af Amer >60 >60 mL/min   GFR calc Af Amer >60 >60 mL/min   Anion gap 16 (H) 5 - 15  CBC  Result Value Ref Range   WBC 13.1 (H) 4.0 - 10.5 K/uL   RBC 3.43 (L) 3.87 - 5.11 MIL/uL   Hemoglobin 11.8 (L) 12.0 - 15.0 g/dL   HCT 33.3 (L) 36.0 - 46.0 %   MCV 97.1 78.0 - 100.0 fL   MCH 34.4 (H) 26.0 - 34.0 pg   MCHC 35.4 30.0 - 36.0 g/dL   RDW 13.4 11.5 - 15.5 %   Platelets 165 150 - 400 K/uL  Hepatic function panel  Result Value Ref Range   Total Protein 7.7 6.5 - 8.1 g/dL   Albumin 4.6 3.5 - 5.0 g/dL   AST 71 (H) 15 - 41 U/L   ALT 28 14 - 54 U/L   Alkaline Phosphatase 88 38 - 126 U/L   Total Bilirubin 1.8 (H) 0.3 - 1.2 mg/dL   Bilirubin, Direct 0.4 0.1 - 0.5 mg/dL   Indirect Bilirubin 1.4 (H) 0.3 - 0.9 mg/dL  Urinalysis, Routine w reflex microscopic  Result Value Ref Range   Color, Urine AMBER (A) YELLOW   APPearance CLOUDY (A) CLEAR   Specific Gravity, Urine 1.030 1.005 - 1.030   pH 5.0 5.0 - 8.0   Glucose, UA NEGATIVE NEGATIVE mg/dL   Hgb urine dipstick NEGATIVE NEGATIVE   Bilirubin Urine SMALL (A) NEGATIVE   Ketones, ur 5 (A) NEGATIVE mg/dL   Protein, ur 100 (A) NEGATIVE mg/dL   Nitrite NEGATIVE NEGATIVE   Leukocytes, UA SMALL (A)  NEGATIVE   RBC / HPF 0-5 0 - 5 RBC/hpf   WBC, UA 6-30 0 - 5 WBC/hpf   Bacteria, UA RARE (A) NONE SEEN   Squamous Epithelial / LPF 6-30 (A) NONE SEEN   Mucous PRESENT    Hyaline Casts, UA PRESENT   I-stat troponin, ED  Result Value Ref Range   Troponin i, poc 0.03 0.00 - 0.08 ng/mL   Comment 3            Dg Chest 2 View  Result Date: 08/07/2016 CLINICAL DATA:  Fall yesterday, right shoulder pain since fall, chest pain today. Smoker. EXAM: CHEST  2 VIEW COMPARISON:  03/11/2015 FINDINGS: Normal mediastinum and cardiac silhouette. Normal pulmonary vasculature. No evidence of effusion, infiltrate, or pneumothorax. There is acute fracture through the proximal humerus at the surgical neck. Multiple comminuted fragments. The humeral head is displaced inferior consistent with hemarthrosis in the glenohumeral joint. No dislocation evident. IMPRESSION: Comminuted fracture of the proximal RIGHT humerus. No acute cardiopulmonary process. Electronically Signed   By: Suzy Bouchard M.D.   On: 08/07/2016 20:09   Dg Shoulder Right  Result Date: 08/07/2016 CLINICAL DATA:  56 year old who fell yesterday and injured the right shoulder. She presents with pain. Initial encounter. EXAM: RIGHT SHOULDER - 2+ VIEW COMPARISON:  None. FINDINGS: Comminuted multipart fracture involving the right humeral head and neck. The greater tuberosity is present as a free fragment. Large joint effusion/hemarthrosis likely accounts for slight inferior subluxation of the glenohumeral joint. Acromioclavicular joint intact. IMPRESSION: Comminuted multipart fracture involving the right humeral head and neck. Electronically Signed   By: Evangeline Dakin M.D.   On: 08/07/2016 20:07    EKG  EKG Interpretation  Date/Time:  Saturday August 07 2016 19:16:50 EST Ventricular Rate:  144 PR Interval:  126 QRS Duration: 72 QT Interval:  288 QTC Calculation: 445 R Axis:   88 Text Interpretation:  Narrow QRS tachycardia Non-specific ST-t changes Confirmed by Ashok Cordia  MD, Lennette Bihari ([id number]) on 08/07/2016 7:30:18 PM       Radiology Dg Shoulder Right  Result Date: 08/07/2016 CLINICAL DATA:  56 year old who fell yesterday and injured the right shoulder. She presents with pain. Initial encounter. EXAM: RIGHT SHOULDER - 2+ VIEW COMPARISON:  None.  FINDINGS: Comminuted multipart fracture involving the right humeral head and neck. The greater tuberosity is present as a free fragment. Large joint effusion/hemarthrosis likely accounts for slight inferior subluxation of the glenohumeral joint. Acromioclavicular joint intact. IMPRESSION: Comminuted multipart fracture involving the right humeral head and neck. Electronically Signed   By: Evangeline Dakin M.D.   On: 08/07/2016 20:07    Procedures Procedures (including critical care time)  Medications Ordered in ED Medications  sodium chloride 0.9 % bolus 1,000 mL (not administered)  HYDROmorphone (DILAUDID) injection 1 mg (not administered)  ondansetron (ZOFRAN) injection 4 mg (not administered)     Initial Impression / Assessment and Plan / ED Course  I have reviewed the triage vital signs and the nursing notes.  Pertinent labs & imaging results that were available during my care of the patient were reviewed by me and considered in my medical decision making (see chart for details).  Clinical Course     Iv ns bolus. Labs. Cxr and shoulder xray.  Reviewed nursing notes and prior charts for additional history.   Dilaudid 1 mg iv for pain.   Low grade fever in ED.  Initial tachy, heart rate improves w ivf, pain control, and med for fever.   Patient states  feels too weak to go home.   Given productive cough, fever, chest congestion on exam, elev wbc - will give abx for possible resp infxn/early pna.    Given chest pain, tachy, persistent weakness, will admit.   Discussed right shoulder fx with Dr Doran Durand, on call for orthopedics - from that standpoint, he requests ct, and indicates can f/u in office to discuss tx/surgery.   Recheck shoulder pain improved.   No current cp. abd soft nt.   On review old visits, ?hx etoh abuse, pt denies etoh abuse or daily etoh use.   Hospitalists consulted for admission.      Final Clinical Impressions(s) / ED Diagnoses   Final diagnoses:   Fall    New Prescriptions New Prescriptions   No medications on file         Lajean Saver, MD 08/07/16 2352

## 2016-08-08 ENCOUNTER — Encounter (HOSPITAL_COMMUNITY): Payer: Self-pay | Admitting: Family Medicine

## 2016-08-08 DIAGNOSIS — Z8711 Personal history of peptic ulcer disease: Secondary | ICD-10-CM | POA: Diagnosis not present

## 2016-08-08 DIAGNOSIS — S42211A Unspecified displaced fracture of surgical neck of right humerus, initial encounter for closed fracture: Secondary | ICD-10-CM | POA: Diagnosis present

## 2016-08-08 DIAGNOSIS — E871 Hypo-osmolality and hyponatremia: Secondary | ICD-10-CM | POA: Diagnosis present

## 2016-08-08 DIAGNOSIS — M2170 Unequal limb length (acquired), unspecified site: Secondary | ICD-10-CM | POA: Diagnosis present

## 2016-08-08 DIAGNOSIS — D649 Anemia, unspecified: Secondary | ICD-10-CM

## 2016-08-08 DIAGNOSIS — E876 Hypokalemia: Secondary | ICD-10-CM | POA: Diagnosis present

## 2016-08-08 DIAGNOSIS — W19XXXA Unspecified fall, initial encounter: Secondary | ICD-10-CM | POA: Insufficient documentation

## 2016-08-08 DIAGNOSIS — S42201A Unspecified fracture of upper end of right humerus, initial encounter for closed fracture: Secondary | ICD-10-CM | POA: Insufficient documentation

## 2016-08-08 DIAGNOSIS — R091 Pleurisy: Secondary | ICD-10-CM | POA: Diagnosis present

## 2016-08-08 DIAGNOSIS — D62 Acute posthemorrhagic anemia: Secondary | ICD-10-CM | POA: Diagnosis not present

## 2016-08-08 DIAGNOSIS — K7030 Alcoholic cirrhosis of liver without ascites: Secondary | ICD-10-CM | POA: Diagnosis present

## 2016-08-08 DIAGNOSIS — J069 Acute upper respiratory infection, unspecified: Secondary | ICD-10-CM | POA: Diagnosis present

## 2016-08-08 DIAGNOSIS — M25511 Pain in right shoulder: Secondary | ICD-10-CM | POA: Diagnosis present

## 2016-08-08 DIAGNOSIS — Z79899 Other long term (current) drug therapy: Secondary | ICD-10-CM | POA: Diagnosis not present

## 2016-08-08 DIAGNOSIS — F1020 Alcohol dependence, uncomplicated: Secondary | ICD-10-CM | POA: Diagnosis present

## 2016-08-08 DIAGNOSIS — F17210 Nicotine dependence, cigarettes, uncomplicated: Secondary | ICD-10-CM | POA: Diagnosis present

## 2016-08-08 DIAGNOSIS — Y92009 Unspecified place in unspecified non-institutional (private) residence as the place of occurrence of the external cause: Secondary | ICD-10-CM | POA: Diagnosis not present

## 2016-08-08 DIAGNOSIS — W1830XA Fall on same level, unspecified, initial encounter: Secondary | ICD-10-CM | POA: Diagnosis present

## 2016-08-08 DIAGNOSIS — K210 Gastro-esophageal reflux disease with esophagitis: Secondary | ICD-10-CM

## 2016-08-08 DIAGNOSIS — R079 Chest pain, unspecified: Secondary | ICD-10-CM | POA: Diagnosis not present

## 2016-08-08 DIAGNOSIS — S42291A Other displaced fracture of upper end of right humerus, initial encounter for closed fracture: Secondary | ICD-10-CM | POA: Diagnosis present

## 2016-08-08 DIAGNOSIS — G894 Chronic pain syndrome: Secondary | ICD-10-CM | POA: Diagnosis present

## 2016-08-08 DIAGNOSIS — Z7982 Long term (current) use of aspirin: Secondary | ICD-10-CM | POA: Diagnosis not present

## 2016-08-08 DIAGNOSIS — F329 Major depressive disorder, single episode, unspecified: Secondary | ICD-10-CM | POA: Diagnosis present

## 2016-08-08 DIAGNOSIS — Z96643 Presence of artificial hip joint, bilateral: Secondary | ICD-10-CM | POA: Diagnosis present

## 2016-08-08 LAB — RESPIRATORY PANEL BY PCR
Adenovirus: NOT DETECTED
BORDETELLA PERTUSSIS-RVPCR: NOT DETECTED
CORONAVIRUS OC43-RVPPCR: NOT DETECTED
Chlamydophila pneumoniae: NOT DETECTED
Coronavirus 229E: NOT DETECTED
Coronavirus HKU1: NOT DETECTED
Coronavirus NL63: NOT DETECTED
INFLUENZA A-RVPPCR: NOT DETECTED
INFLUENZA B-RVPPCR: NOT DETECTED
MYCOPLASMA PNEUMONIAE-RVPPCR: NOT DETECTED
Metapneumovirus: NOT DETECTED
PARAINFLUENZA VIRUS 1-RVPPCR: NOT DETECTED
PARAINFLUENZA VIRUS 4-RVPPCR: NOT DETECTED
Parainfluenza Virus 2: NOT DETECTED
Parainfluenza Virus 3: NOT DETECTED
RESPIRATORY SYNCYTIAL VIRUS-RVPPCR: NOT DETECTED
Rhinovirus / Enterovirus: NOT DETECTED

## 2016-08-08 LAB — BASIC METABOLIC PANEL
Anion gap: 10 (ref 5–15)
BUN: 9 mg/dL (ref 6–20)
CHLORIDE: 99 mmol/L — AB (ref 101–111)
CO2: 26 mmol/L (ref 22–32)
CREATININE: 0.61 mg/dL (ref 0.44–1.00)
Calcium: 8.4 mg/dL — ABNORMAL LOW (ref 8.9–10.3)
GFR calc non Af Amer: >60 mL/min (ref >60)
GLUCOSE: 100 mg/dL — AB (ref 65–99)
Potassium: 3.7 mmol/L (ref 3.5–5.1)
Sodium: 135 mmol/L (ref 135–145)

## 2016-08-08 LAB — CBC
HCT: 26.9 % — ABNORMAL LOW (ref 36.0–46.0)
Hemoglobin: 9.2 g/dL — ABNORMAL LOW (ref 12.0–15.0)
MCH: 34.1 pg — AB (ref 26.0–34.0)
MCHC: 34.2 g/dL (ref 30.0–36.0)
MCV: 99.6 fL (ref 78.0–100.0)
PLATELETS: 105 10*3/uL — AB (ref 150–400)
RBC: 2.7 MIL/uL — ABNORMAL LOW (ref 3.87–5.11)
RDW: 13.8 % (ref 11.5–15.5)
WBC: 9.9 10*3/uL (ref 4.0–10.5)

## 2016-08-08 LAB — TROPONIN I
TROPONIN I: 0.03 ng/mL — AB (ref <0.03)
Troponin I: 0.08 ng/mL (ref <0.03)
Troponin I: 0.03 ng/mL (ref <0.03)

## 2016-08-08 LAB — ETHANOL: Alcohol, Ethyl (B): <5 mg/dL (ref <5)

## 2016-08-08 LAB — MAGNESIUM: MAGNESIUM: 1.4 mg/dL — AB (ref 1.7–2.4)

## 2016-08-08 MED ORDER — VITAMIN B-1 100 MG PO TABS
100.0000 mg | ORAL_TABLET | Freq: Every day | ORAL | Status: DC
  Administered 2016-08-08 – 2016-08-12 (×5): 100 mg via ORAL
  Filled 2016-08-08 (×5): qty 1

## 2016-08-08 MED ORDER — POLYETHYLENE GLYCOL 3350 17 G PO PACK
17.0000 g | PACK | Freq: Two times a day (BID) | ORAL | Status: DC
  Administered 2016-08-08 – 2016-08-12 (×5): 17 g via ORAL
  Filled 2016-08-08 (×7): qty 1

## 2016-08-08 MED ORDER — FAMOTIDINE IN NACL 20-0.9 MG/50ML-% IV SOLN
20.0000 mg | Freq: Two times a day (BID) | INTRAVENOUS | Status: DC
  Administered 2016-08-08 (×2): 20 mg via INTRAVENOUS
  Filled 2016-08-08 (×2): qty 50

## 2016-08-08 MED ORDER — LORAZEPAM 2 MG/ML IJ SOLN: 0.0000 mg | Freq: Four times a day (QID) | INTRAMUSCULAR | Status: AC

## 2016-08-08 MED ORDER — ADULT MULTIVITAMIN W/MINERALS CH
1.0000 | ORAL_TABLET | Freq: Every day | ORAL | Status: DC
  Administered 2016-08-08 – 2016-08-12 (×5): 1 via ORAL
  Filled 2016-08-08 (×5): qty 1

## 2016-08-08 MED ORDER — ZOLPIDEM TARTRATE 5 MG PO TABS
5.0000 mg | ORAL_TABLET | Freq: Every evening | ORAL | Status: DC | PRN
  Administered 2016-08-08 – 2016-08-09 (×2): 5 mg via ORAL
  Filled 2016-08-08 (×2): qty 1

## 2016-08-08 MED ORDER — SODIUM CHLORIDE 0.9 % IV SOLN
INTRAVENOUS | Status: DC
  Administered 2016-08-08 – 2016-08-09 (×2): via INTRAVENOUS

## 2016-08-08 MED ORDER — AZITHROMYCIN 250 MG PO TABS: 250.0000 mg | ORAL_TABLET | Freq: Every day | ORAL | Status: DC

## 2016-08-08 MED ORDER — SODIUM CHLORIDE 0.9 % IV SOLN
INTRAVENOUS | Status: AC
  Administered 2016-08-08: 03:00:00 via INTRAVENOUS

## 2016-08-08 MED ORDER — HYDROCODONE-ACETAMINOPHEN 5-325 MG PO TABS
1.0000 | ORAL_TABLET | Freq: Four times a day (QID) | ORAL | Status: DC | PRN
  Administered 2016-08-08 (×3): 1 via ORAL
  Administered 2016-08-09 – 2016-08-10 (×5): 2 via ORAL
  Filled 2016-08-08 (×2): qty 2
  Filled 2016-08-08: qty 1
  Filled 2016-08-08 (×2): qty 2
  Filled 2016-08-08: qty 1
  Filled 2016-08-08: qty 2
  Filled 2016-08-08: qty 1

## 2016-08-08 MED ORDER — FOLIC ACID 1 MG PO TABS
1.0000 mg | ORAL_TABLET | Freq: Every day | ORAL | Status: DC
  Administered 2016-08-08 – 2016-08-12 (×5): 1 mg via ORAL
  Filled 2016-08-08 (×5): qty 1

## 2016-08-08 MED ORDER — NITROGLYCERIN 0.4 MG SL SUBL: 0.4000 mg | SUBLINGUAL_TABLET | SUBLINGUAL | Status: DC | PRN

## 2016-08-08 MED ORDER — HYDROMORPHONE HCL 1 MG/ML IJ SOLN: 0.5000 mg | INTRAMUSCULAR | Status: DC | PRN

## 2016-08-08 MED ORDER — FAMOTIDINE 20 MG PO TABS
20.0000 mg | ORAL_TABLET | Freq: Two times a day (BID) | ORAL | Status: DC
  Administered 2016-08-08 – 2016-08-12 (×8): 20 mg via ORAL
  Filled 2016-08-08 (×8): qty 1

## 2016-08-08 MED ORDER — DEXTROSE 5 % IV SOLN: 1.0000 g | INTRAVENOUS | Status: DC

## 2016-08-08 MED ORDER — ASPIRIN EC 81 MG PO TBEC
81.0000 mg | DELAYED_RELEASE_TABLET | Freq: Every day | ORAL | Status: DC
  Administered 2016-08-08 – 2016-08-12 (×5): 81 mg via ORAL
  Filled 2016-08-08 (×5): qty 1

## 2016-08-08 MED ORDER — ADULT MULTIVITAMIN W/MINERALS CH: 1.0000 | ORAL_TABLET | Freq: Every day | ORAL | Status: DC

## 2016-08-08 MED ORDER — ONDANSETRON HCL 4 MG/2ML IJ SOLN: 4.0000 mg | Freq: Four times a day (QID) | INTRAMUSCULAR | Status: DC | PRN

## 2016-08-08 MED ORDER — ENOXAPARIN SODIUM 30 MG/0.3ML ~~LOC~~ SOLN
30.0000 mg | SUBCUTANEOUS | Status: DC
  Administered 2016-08-08 – 2016-08-12 (×4): 30 mg via SUBCUTANEOUS
  Filled 2016-08-08 (×4): qty 0.3

## 2016-08-08 MED ORDER — MAGNESIUM SULFATE 2 GM/50ML IV SOLN
2.0000 g | Freq: Once | INTRAVENOUS | Status: AC
  Administered 2016-08-08: 2 g via INTRAVENOUS
  Filled 2016-08-08: qty 50

## 2016-08-08 MED ORDER — ONDANSETRON HCL 4 MG/2ML IJ SOLN
4.0000 mg | Freq: Four times a day (QID) | INTRAMUSCULAR | Status: DC | PRN
  Administered 2016-08-08 – 2016-08-10 (×3): 4 mg via INTRAVENOUS
  Filled 2016-08-08 (×2): qty 2

## 2016-08-08 MED ORDER — LORAZEPAM 2 MG/ML IJ SOLN
0.0000 mg | Freq: Two times a day (BID) | INTRAMUSCULAR | Status: AC
  Administered 2016-08-10: 2 mg via INTRAVENOUS
  Filled 2016-08-08: qty 1

## 2016-08-08 MED ORDER — ACETAMINOPHEN 325 MG PO TABS: 650.0000 mg | ORAL_TABLET | ORAL | Status: DC | PRN

## 2016-08-08 MED ORDER — LORAZEPAM 1 MG PO TABS: 1.0000 mg | ORAL_TABLET | Freq: Four times a day (QID) | ORAL | Status: AC | PRN

## 2016-08-08 MED ORDER — LORAZEPAM 2 MG/ML IJ SOLN: 1.0000 mg | Freq: Four times a day (QID) | INTRAMUSCULAR | Status: AC | PRN

## 2016-08-08 MED ORDER — ACETAMINOPHEN 325 MG PO TABS: 650.0000 mg | ORAL_TABLET | Freq: Four times a day (QID) | ORAL | Status: DC | PRN

## 2016-08-08 MED ORDER — THIAMINE HCL 100 MG/ML IJ SOLN: 100.0000 mg | Freq: Every day | INTRAMUSCULAR | Status: DC

## 2016-08-08 MED ORDER — FERROUS SULFATE 325 (65 FE) MG PO TABS
325.0000 mg | ORAL_TABLET | Freq: Every day | ORAL | Status: DC
  Administered 2016-08-08 – 2016-08-12 (×4): 325 mg via ORAL
  Filled 2016-08-08 (×4): qty 1

## 2016-08-08 MED ORDER — HYDROMORPHONE HCL 2 MG/ML IJ SOLN: 0.5000 mg | INTRAMUSCULAR | Status: DC | PRN

## 2016-08-08 NOTE — Progress Notes (Signed)
   08/08/16 0252  Vitals  Temp 98.9 F (37.2 C)  Temp Source Oral  BP 111/73  BP Location Left Arm  BP Method Automatic  Patient Position (if appropriate) Sitting  Pulse Rate (!) 102  Pulse Rate Source Dinamap  Resp 16  Oxygen Therapy  SpO2 100 %  O2 Device Room Air  Height and Weight  Height 5\' 3"  (1.6 m)  Weight 52.9 kg (116 lb 11.2 oz) (a scale)  Type of Scale Used Standing  Type of Weight Actual  BSA (Calculated - sq m) 1.53 sq meters  BMI (Calculated) 20.7  Weight in (lb) to have BMI = 25 140.8  Admitted pt to rm 3E02 from ED, pt alert and oriented, placed on high/low bed, oriented to room, call bell placed within reach, placed on cardiac monitor, CCMD made aware.

## 2016-08-08 NOTE — H&P (Signed)
History and Physical    Jaime Phillips [id number] DOB: Apr 25, 1961 DOA: 08/07/2016  PCP: [facility]   Patient coming from: Home  Chief Complaint: Right shoulder pain, chest pain, fevers, cough  HPI: Jaime Phillips is a 56 y.o. female with medical history significant for alcohol abuse, depression, iron deficiency anemia, and GERD who presents to the emergency department with fevers, malaise, cough, intermittent chest pain, and right shoulder pain after a fall at home yesterday. Patient reports that she developed fevers, malaise, and productive cough approximately 3 days ago after being with an ill grandchild a few days prior. Since that time, she has had intermittent fevers, generalized malaise, and cough, but reports that this is improving. She suffered a ground-level mechanical fall yesterday at home which she attributes to her leg length discrepancy. She fell onto an outstretched right arm and has been experiencing right shoulder pain ever since. Pain is described as severe, sharp, worse with any movement, and with no alleviating factors identified. Patient is also noted intermittent pain in the central chest over the past 2 days, described as moderate in intensity, pressure-like in character, occurring at rest, lasting several minutes. She is unable to identify alleviating or exacerbating factors for the chest pain. Patient reports ongoing alcohol use with daily drinking, but reports that she has cut back from her prior levels of consumption and she denies any withdrawals recently, though endorses daily drinking.  ED Course: Upon arrival to the ED, patient is found to have a temp of 37.8 C, saturating well on room air, tachycardic to 145 in a sinus rhythm, and was stable blood pressure. EKG features a sinus tachycardia with rate 144 and chest x-ray is negative for acute cardiopulmonary disease. Radiographs of the right shoulder demonstrate a comminuted fracture of the  proximal right humerus involving the humeral head. Chemistry panels notable for a sodium of 133, AST of 71, and total bilirubin 1.8. CBC is notable for a leukocytosis to 13,100 and a stable normocytic anemia with hemoglobin of 11.8. Troponin is within the normal limits at 0.03. Urinalysis features rare bacteria with small leukocytes, negative nitrites, and 6-30 WBC. Patient was given 1.5 L normal saline bolus in the emergency department and treated with Tylenol, Zofran, Dilaudid, and empiric Rocephin and azithromycin. Orthopedic surgery was consulted by the ED physician and advised obtaining a CT of the right shoulder and having the patient follow-up in the clinic for discussion of definitive management. Tachycardia has resolved with fluids and chest pain has resolved following treatment with analgesics. Patient will be observed on the telemetry unit for ongoing evaluation and management of chest pain, fevers and cough, and right shoulder pain with humeral head fracture.  Review of Systems:  All other systems reviewed and apart from HPI, are negative.  Past Medical History:  Diagnosis Date  . Abnormal uterine bleeding   . Alcoholism ([place])   . Arthritis   . Chronic pain syndrome   . Depression   . Diverticulosis   . Headache(784.0)   . Hiatal hernia   . Hyperplastic colon polyp   . Hypopotassemia   . Internal hemorrhoids   . LFT elevation   . Macrocytic anemia   . Mallory-Weiss tear   . Potassium (K) deficiency   . PUD (peptic ulcer disease)   . Rectal bleeding    minor     Past Surgical History:  Procedure Laterality Date  . APPENDECTOMY    . COLONOSCOPY    . ESOPHAGOGASTRODUODENOSCOPY N/A 02/06/2014  Procedure: ESOPHAGOGASTRODUODENOSCOPY (EGD);  Surgeon: Milus Banister, MD;  Location: [facility];  Service: Endoscopy;  Laterality: N/A;  . OOPHORECTOMY    . ORIF PERIPROSTHETIC FRACTURE Right 05/28/2013   Procedure: OPEN REDUCTION INTERNAL FIXATION (ORIF) PERIPROSTHETIC FRACTURE;   Surgeon: Mauri Pole, MD;  Location: [facility];  Service: Orthopedics;  Laterality: Right;  . SHOULDER SURGERY Left   . TOTAL HIP ARTHROPLASTY Right 04/10/2013   Procedure: RIGHT TOTAL HIP ARTHROPLASTY ANTERIOR APPROACH;  Surgeon: Mauri Pole, MD;  Location: [facility];  Service: Orthopedics;  Laterality: Right;  . TOTAL HIP ARTHROPLASTY     left hip     reports that she has been smoking Cigarettes.  She has a 12.50 pack-year smoking history. She has never used smokeless tobacco. She reports that she drinks about 1.2 - 1.8 oz of alcohol per week . She reports that she does not use drugs.  Allergies  Allergen Reactions  . Morphine And Related Nausea Only and Anxiety    Family History  Problem Relation Age of Onset  . Breast cancer Sister   . Diabetes Mother   . Kidney disease Mother   . Heart disease Father   . Colon cancer Father     questionable     Prior to Admission medications   Medication Sig Start Date End Date Taking? Authorizing Provider  aspirin 325 MG tablet Take 325 mg by mouth daily.   Yes Historical Provider, MD  ferrous sulfate 325 (65 FE) MG tablet Take 325 mg by mouth daily with breakfast.   Yes Historical Provider, MD  ibuprofen (ADVIL,MOTRIN) 800 MG tablet Take 1 tablet (800 mg total) by mouth every 8 (eight) hours as needed for headache, mild pain or moderate pain. 11/07/15  Yes Encarnacion Slates, NP  Multiple Vitamin (MULTIVITAMIN WITH MINERALS) TABS tablet Take 1 tablet by mouth daily.   Yes Historical Provider, MD    Physical Exam: Vitals:   08/07/16 2145 08/07/16 2203 08/07/16 2245 08/07/16 2300  BP: 136/80  139/78 131/73  Pulse: 115  97 93  Resp: 23  18 16   Temp:  99 F (37.2 C)    TempSrc:  Oral    SpO2: 100%  100% 98%  Weight:      Height:          Constitutional: NAD, calm, comfortable. Very thin. Eyes: PERTLA, lids and conjunctivae normal ENMT: Mucous membranes are dry. Posterior pharynx clear of any exudate or lesions.   Neck: normal, supple,  no masses, no thyromegaly Respiratory: clear to auscultation bilaterally, no wheezing, no crackles. Normal respiratory effort.   Cardiovascular: S1 & S2 heard, regular rate and rhythm. No extremity edema. No significant JVD. Abdomen: No distension, no tenderness, no masses palpated. Bowel sounds normal.  Musculoskeletal: no clubbing / cyanosis. Right shoulder swelling and exquisite tenderness; sensation and pulses intact distally. Normal muscle tone.  Skin: no significant rashes, lesions, ulcers. Warm, dry, well-perfused. Neurologic: CN 2-12 grossly intact. Sensation intact, DTR normal. Grip strength 5/5 bilaterally.  Psychiatric: Normal judgment and insight. Alert and oriented x 3. Normal mood and affect.     Labs on Admission: I have personally reviewed following labs and imaging studies  CBC:  Recent Labs Lab 08/07/16 1934  WBC 13.1*  HGB 11.8*  HCT 33.3*  MCV 97.1  PLT 123XX123   Basic Metabolic Panel:  Recent Labs Lab 08/07/16 1934  NA 133*  K 3.7  CL 93*  CO2 24  GLUCOSE 125*  BUN 11  CREATININE  0.72  CALCIUM 9.3   GFR: Estimated Creatinine Clearance: 61.9 mL/min (by C-G formula based on SCr of 0.72 mg/dL). Liver Function Tests:  Recent Labs Lab 08/07/16 2011  AST 71*  ALT 28  ALKPHOS 88  BILITOT 1.8*  PROT 7.7  ALBUMIN 4.6   No results for input(s): LIPASE, AMYLASE in the last 168 hours. No results for input(s): AMMONIA in the last 168 hours. Coagulation Profile: No results for input(s): INR, PROTIME in the last 168 hours. Cardiac Enzymes: No results for input(s): CKTOTAL, CKMB, CKMBINDEX, TROPONINI in the last 168 hours. BNP (last 3 results) No results for input(s): PROBNP in the last 8760 hours. HbA1C: No results for input(s): HGBA1C in the last 72 hours. CBG: No results for input(s): GLUCAP in the last 168 hours. Lipid Profile: No results for input(s): CHOL, HDL, LDLCALC, TRIG, CHOLHDL, LDLDIRECT in the last 72 hours. Thyroid Function Tests: No  results for input(s): TSH, T4TOTAL, FREET4, T3FREE, THYROIDAB in the last 72 hours. Anemia Panel: No results for input(s): VITAMINB12, FOLATE, FERRITIN, TIBC, IRON, RETICCTPCT in the last 72 hours. Urine analysis:    Component Value Date/Time   COLORURINE AMBER (A) 08/07/2016 2017   APPEARANCEUR CLOUDY (A) 08/07/2016 2017   LABSPEC 1.030 08/07/2016 2017   PHURINE 5.0 08/07/2016 2017   GLUCOSEU NEGATIVE 08/07/2016 2017   HGBUR NEGATIVE 08/07/2016 2017   BILIRUBINUR SMALL (A) 08/07/2016 2017   BILIRUBINUR small 01/17/2014 1752   KETONESUR 5 (A) 08/07/2016 2017   PROTEINUR 100 (A) 08/07/2016 2017   UROBILINOGEN 0.2 03/11/2015 2303   NITRITE NEGATIVE 08/07/2016 2017   LEUKOCYTESUR SMALL (A) 08/07/2016 2017   Sepsis Labs: @LABRCNTIP (procalcitonin:4,lacticidven:4) )No results found for this or any previous visit (from the past 240 hour(s)).   Radiological Exams on Admission: Dg Chest 2 View  Result Date: 08/07/2016 CLINICAL DATA:  Fall yesterday, right shoulder pain since fall, chest pain today. Smoker. EXAM: CHEST  2 VIEW COMPARISON:  03/11/2015 FINDINGS: Normal mediastinum and cardiac silhouette. Normal pulmonary vasculature. No evidence of effusion, infiltrate, or pneumothorax. There is acute fracture through the proximal humerus at the surgical neck. Multiple comminuted fragments. The humeral head is displaced inferior consistent with hemarthrosis in the glenohumeral joint. No dislocation evident. IMPRESSION: Comminuted fracture of the proximal RIGHT humerus. No acute cardiopulmonary process. Electronically Signed   By: Suzy Bouchard M.D.   On: 08/07/2016 20:09   Dg Shoulder Right  Result Date: 08/07/2016 CLINICAL DATA:  56 year old who fell yesterday and injured the right shoulder. She presents with pain. Initial encounter. EXAM: RIGHT SHOULDER - 2+ VIEW COMPARISON:  None. FINDINGS: Comminuted multipart fracture involving the right humeral head and neck. The greater tuberosity is  present as a free fragment. Large joint effusion/hemarthrosis likely accounts for slight inferior subluxation of the glenohumeral joint. Acromioclavicular joint intact. IMPRESSION: Comminuted multipart fracture involving the right humeral head and neck. Electronically Signed   By: Evangeline Dakin M.D.   On: 08/07/2016 20:07    EKG: Independently reviewed. Sinus tachycardia (rate 144)   Assessment/Plan  1. Chest pain  - Pain is atypical for cardiac etiology and more likely related to the acute URI, trauma, or GERD - Initial EKG with sinus tachycardia in 140s and will be repeated  - Initial troponin wnl at 0.03 - She takes a daily ASA 325  - Pain resolved with APAP and Dilaudid in ED  - Plan to monitor on telemetry, obtain serial troponin measurements, repeat EKG, treat GERD and URI as below    2.  Right humeral head fracture  - Pt fell onto an outstretched right arm on 08/07/15 with immediate pain at the right shoulder  - Radiographs demonstrate a comminuted fracture involving humeral head  - She is neurovascularly intact distal to injury - Orthopedic surgery was consulted by ED physician; CT shoulder and outpatient follow-up was advised  - CT shoulder ordered, pending - Shoulder will be immobilized; symptom-management with analgesia   3. Acute URI  - Pt presents with fevers at home, productive cough, malaise  - There is a mild leukocytosis; CXR clear and no rhonchi appreciated  - Likely a viral URI; pt reports it has been improving - Check respiratory virus panel and sputum culture  - She received empiric Rocephin and azithromycin in ED; plan to hold further abx pending sputum culture    4. GERD - EGD (August 2016) with mod-severe reflux esophagitis, gastritis, and duodenal inflammation; no varices   - She was started on daily Protonix by GI but has since run out   - Suspect this may be the etiology of her chest discomfort and Pepcid 20 mg IV given, continue q12h   5. Alcohol abuse,  cirrhosis  - Pt has long history of alcohol abuse and reports cutting back, admits to continued daily drinking, but remains very vague when pressed for amount consumed  - Plan to monitor with CIWA and prn Ativan  - [place] with elastography in January 2017 suggests advanced fibrosis; EGD that August without varices  6. Hyponatremia  - Serum sodium 133 on admission in setting of dehydration and cirrhosis  - She was given 1.5 L NS in ED and is continued on NS infusion  - Repeat chem panel in am   7. Normocytic anemia - Hgb is 11.8 on admission, stable relative to priors and with no apparent bleeding  - Has been attributed to iron-deficiency, will continue oral supplementation    DVT prophylaxis: sq Lovenox  Code Status: Full  Family Communication: Discussed with patient Disposition Plan: Observe on telemetry Consults called: Orthopedic surgery Admission status: Observation    Vianne Bulls, MD [facility] Pager [phone]  If 7PM-7AM, please contact night-coverage [email or web address] Password [email or web address]  08/08/2016, 12:33 AM

## 2016-08-08 NOTE — Progress Notes (Signed)
Patient stable during 7 a to 7 p shift, did not require any Ativan.  Patient very reluctant to take any pain medication, did take Norco twice during shift with some improvement in right shoulder pain.  Wearing sling continuously.  Up and down to bathroom with standby assist.

## 2016-08-08 NOTE — Progress Notes (Signed)
Son, Linton Rump called front desk stating he was his mother's power of attorney and wanted to know what was going on with her.  Son was told that patient is completely alert and oriented and able to tell him herself her medical condition.  RN did tell patient he called and asked if he was the healthcare power of attorney to which she said no.  She said son probably did not believe her shoulder was broken and that is why he called the desk to confirm this.  Says she told him she could not babysit because of her broken shoulder and he did not believe her.    I reiterated to patient that she is alert and oriented and that staff will not be giving out her medical information to her family members unless she expressly requests that we do so.

## 2016-08-08 NOTE — Progress Notes (Signed)
Respiratory panel noted to be negative, droplet precautions discontinued as per protocol.

## 2016-08-08 NOTE — Progress Notes (Signed)
Orthopedic Tech Progress Note Patient Details:  Jaime Phillips June 29, 1961 [id number]  Ortho Devices Type of Ortho Device: Sling immobilizer Ortho Device/Splint Location: rue Ortho Device/Splint Interventions: Application   Angelgabriel Willmore 08/08/2016, 2:08 PM

## 2016-08-08 NOTE — Progress Notes (Signed)
PROGRESS NOTE    Jaime Phillips  [id number] DOB: [birth year] DOA: 08/07/2016 PCP: [facility]   Brief Narrative:  Jaime Phillips is a 56 y.o. female with medical history significant for alcohol abuse, depression, iron deficiency anemia, and GERD who presents to the emergency department with fevers, malaise, cough, intermittent chest pain, and right shoulder pain after a fall at home yesterday. Patient reports that she developed fevers, malaise, and productive cough approximately 3 days ago after being with an ill grandchild a few days prior. She suffered a ground-level mechanical fall yesterday at home which she attributes to her leg length discrepancy. She fell onto an outstretched right arm and has been experiencing right shoulder pain ever since. She continue to drink alcohol less amount.   Assessment & Plan:   Principal Problem:   Chest pain Active Problems:   Normocytic anemia   Gastro-esophageal reflux   Cirrhosis (HCC)   Alcohol use disorder, severe, dependence (HCC)   Humeral head fracture, right, closed, initial encounter   Hyponatremia   Acute URI  1-Chest Pain;  Troponin mildly elevated. Check ECHO/  Initial EKG with sinus tachycardia.   2-Right humeral head fracture;  -CT :Comminuted and displaced surgical neck fracture of the right humerus involves the greater tuberosity. No displacement of greater tuberosity is present. The exam is otherwise negative. - Pt fell onto an outstretched right arm on 08/07/15 with immediate pain at the right shoulder  - Radiographs demonstrate a comminuted fracture involving humeral head  -Shoulder will be immobilized.  -ortho consulted.   3-URI; fever; Follow urine culture.  Respiratory panel pending.  WBC trending down.   4-Alcohol Abuse, cirrhosis;  Plan to monitor with CIWA and prn Ativan  Mildly elevated AST, consistent with alcohol intake. Mildly elevated bili. Monitor for now.    5-Hyponatremia;    Improved with fluids.   Prolong QT;  Replete mg.   Anemia;  Has been attributed to iron-deficiency, will continue oral supplementation     DVT prophylaxis: Lovenox Code Status: Full code.  Family Communication: care discussed with patient.  Disposition Plan:  To be determine. Remain inpatient for pain management, fluids, ECHO  Consultants:   Ortho    Procedures:   none   Antimicrobials:  none   Subjective: Complaining of shoulder pain.  Denies abdominal pain  Objective: Vitals:   08/07/16 2245 08/07/16 2300 08/08/16 0045 08/08/16 0252  BP: 139/78 131/73 123/78 111/73  Pulse: 97 93 91 (!) 102  Resp: 18 16 15 16   Temp:    98.9 F (37.2 C)  TempSrc:    Oral  SpO2: 100% 98% 99% 100%  Weight:    52.9 kg (116 lb 11.2 oz)  Height:    5\' 3"  (1.6 m)    Intake/Output Summary (Last 24 hours) at 08/08/16 0719 Last data filed at 08/08/16 0500  Gross per 24 hour  Intake          1251.67 ml  Output                0 ml  Net          1251.67 ml   Filed Weights   08/07/16 1918 08/08/16 0252  Weight: 49.9 kg (110 lb) 52.9 kg (116 lb 11.2 oz)    Examination:  General exam: Appears calm and comfortable  Respiratory system: Clear to auscultation. Respiratory effort normal. Cardiovascular system: S1 & S2 heard, RRR. No JVD, murmurs, rubs, gallops or clicks. No pedal edema. Gastrointestinal system:  Abdomen is nondistended, soft and nontender. No organomegaly or masses felt. Normal bowel sounds heard. Central nervous system: Alert and oriented. No focal neurological deficits. Extremities: Symmetric 4 x 5 power. Right arm in sling.  Skin: No rashes, lesions or ulcers Psychiatry: Judgement and insight appear normal. Mood & affect appropriate.     Data Reviewed: I have personally reviewed following labs and imaging studies  CBC:  Recent Labs Lab 08/07/16 1934  WBC 13.1*  HGB 11.8*  HCT 33.3*  MCV 97.1  PLT 123XX123   Basic Metabolic Panel:  Recent Labs Lab  08/07/16 1934  NA 133*  K 3.7  CL 93*  CO2 24  GLUCOSE 125*  BUN 11  CREATININE 0.72  CALCIUM 9.3   GFR: Estimated Creatinine Clearance: 65 mL/min (by C-G formula based on SCr of 0.72 mg/dL). Liver Function Tests:  Recent Labs Lab 08/07/16 2011  AST 71*  ALT 28  ALKPHOS 88  BILITOT 1.8*  PROT 7.7  ALBUMIN 4.6   No results for input(s): LIPASE, AMYLASE in the last 168 hours. No results for input(s): AMMONIA in the last 168 hours. Coagulation Profile: No results for input(s): INR, PROTIME in the last 168 hours. Cardiac Enzymes:  Recent Labs Lab 08/08/16 0114  TROPONINI 0.03*   BNP (last 3 results) No results for input(s): PROBNP in the last 8760 hours. HbA1C: No results for input(s): HGBA1C in the last 72 hours. CBG: No results for input(s): GLUCAP in the last 168 hours. Lipid Profile: No results for input(s): CHOL, HDL, LDLCALC, TRIG, CHOLHDL, LDLDIRECT in the last 72 hours. Thyroid Function Tests: No results for input(s): TSH, T4TOTAL, FREET4, T3FREE, THYROIDAB in the last 72 hours. Anemia Panel: No results for input(s): VITAMINB12, FOLATE, FERRITIN, TIBC, IRON, RETICCTPCT in the last 72 hours. Sepsis Labs: No results for input(s): PROCALCITON, LATICACIDVEN in the last 168 hours.  No results found for this or any previous visit (from the past 240 hour(s)).       Radiology Studies: Dg Chest 2 View  Result Date: 08/07/2016 CLINICAL DATA:  Fall yesterday, right shoulder pain since fall, chest pain today. Smoker. EXAM: CHEST  2 VIEW COMPARISON:  03/11/2015 FINDINGS: Normal mediastinum and cardiac silhouette. Normal pulmonary vasculature. No evidence of effusion, infiltrate, or pneumothorax. There is acute fracture through the proximal humerus at the surgical neck. Multiple comminuted fragments. The humeral head is displaced inferior consistent with hemarthrosis in the glenohumeral joint. No dislocation evident. IMPRESSION: Comminuted fracture of the proximal  RIGHT humerus. No acute cardiopulmonary process. Electronically Signed   By: Suzy Bouchard M.D.   On: 08/07/2016 20:09   Dg Shoulder Right  Result Date: 08/07/2016 CLINICAL DATA:  56 year old who fell yesterday and injured the right shoulder. She presents with pain. Initial encounter. EXAM: RIGHT SHOULDER - 2+ VIEW COMPARISON:  None. FINDINGS: Comminuted multipart fracture involving the right humeral head and neck. The greater tuberosity is present as a free fragment. Large joint effusion/hemarthrosis likely accounts for slight inferior subluxation of the glenohumeral joint. Acromioclavicular joint intact. IMPRESSION: Comminuted multipart fracture involving the right humeral head and neck. Electronically Signed   By: Evangeline Dakin M.D.   On: 08/07/2016 20:07        Scheduled Meds: . aspirin EC  81 mg Oral Daily  . enoxaparin (LOVENOX) injection  30 mg Subcutaneous Q24H  . famotidine (PEPCID) IV  20 mg Intravenous Q12H  . ferrous sulfate  325 mg Oral Q breakfast  . folic acid  1 mg Oral Daily  .  LORazepam  0-4 mg Intravenous Q6H   Followed by  . [START ON 08/10/2016] LORazepam  0-4 mg Intravenous Q12H  . multivitamin with minerals  1 tablet Oral Daily  . thiamine  100 mg Oral Daily   Or  . thiamine  100 mg Intravenous Daily   Continuous Infusions: . sodium chloride 100 mL/hr at 08/08/16 0311     LOS: 0 days    Time spent: 35 minutes.     Elmarie Shiley, MD [facility] Pager [phone]  If 7PM-7AM, please contact night-coverage [email or web address] Password [email or web address] 08/08/2016, 7:19 AM

## 2016-08-09 ENCOUNTER — Inpatient Hospital Stay (HOSPITAL_COMMUNITY): Payer: Medicaid Other

## 2016-08-09 DIAGNOSIS — R079 Chest pain, unspecified: Secondary | ICD-10-CM

## 2016-08-09 LAB — BASIC METABOLIC PANEL
Anion gap: 8 (ref 5–15)
BUN: 5 mg/dL — AB (ref 6–20)
CALCIUM: 8.3 mg/dL — AB (ref 8.9–10.3)
CHLORIDE: 102 mmol/L (ref 101–111)
CO2: 25 mmol/L (ref 22–32)
CREATININE: 0.58 mg/dL (ref 0.44–1.00)
GFR calc Af Amer: >60 mL/min (ref >60)
GFR calc non Af Amer: >60 mL/min (ref >60)
Glucose, Bld: 136 mg/dL — ABNORMAL HIGH (ref 65–99)
Potassium: 2.9 mmol/L — ABNORMAL LOW (ref 3.5–5.1)
SODIUM: 135 mmol/L (ref 135–145)

## 2016-08-09 LAB — CBC
HEMATOCRIT: 24.8 % — AB (ref 36.0–46.0)
HEMOGLOBIN: 8.4 g/dL — AB (ref 12.0–15.0)
MCH: 33.9 pg (ref 26.0–34.0)
MCHC: 33.9 g/dL (ref 30.0–36.0)
MCV: 100 fL (ref 78.0–100.0)
Platelets: 95 10*3/uL — ABNORMAL LOW (ref 150–400)
RBC: 2.48 MIL/uL — ABNORMAL LOW (ref 3.87–5.11)
RDW: 13.3 % (ref 11.5–15.5)
WBC: 7.8 10*3/uL (ref 4.0–10.5)

## 2016-08-09 LAB — URINE CULTURE

## 2016-08-09 LAB — ECHOCARDIOGRAM COMPLETE
HEIGHTINCHES: 63 in
Weight: 1915.2 oz

## 2016-08-09 LAB — SURGICAL PCR SCREEN
MRSA, PCR: NEGATIVE
Staphylococcus aureus: NEGATIVE

## 2016-08-09 LAB — MAGNESIUM: Magnesium: 2 mg/dL (ref 1.7–2.4)

## 2016-08-09 MED ORDER — CEFAZOLIN SODIUM-DEXTROSE 2-4 GM/100ML-% IV SOLN
2.0000 g | INTRAVENOUS | Status: AC
  Administered 2016-08-10: 2 g via INTRAVENOUS

## 2016-08-09 MED ORDER — LACTATED RINGERS IV SOLN
INTRAVENOUS | Status: DC
  Administered 2016-08-09: 21:00:00 via INTRAVENOUS

## 2016-08-09 MED ORDER — CHLORHEXIDINE GLUCONATE 4 % EX LIQD
60.0000 mL | Freq: Once | CUTANEOUS | Status: AC
  Administered 2016-08-09: 4 via TOPICAL
  Filled 2016-08-09: qty 60

## 2016-08-09 MED ORDER — SODIUM CHLORIDE 0.9 % IV SOLN
30.0000 meq | Freq: Once | INTRAVENOUS | Status: AC
  Administered 2016-08-09: 30 meq via INTRAVENOUS
  Filled 2016-08-09: qty 15

## 2016-08-09 MED ORDER — POTASSIUM CHLORIDE CRYS ER 20 MEQ PO TBCR
40.0000 meq | EXTENDED_RELEASE_TABLET | Freq: Once | ORAL | Status: AC
  Administered 2016-08-09: 40 meq via ORAL
  Filled 2016-08-09: qty 2

## 2016-08-09 MED ORDER — ACETAMINOPHEN 500 MG PO TABS: 1000.0000 mg | ORAL_TABLET | Freq: Once | ORAL | Status: DC

## 2016-08-09 MED ORDER — PROMETHAZINE HCL 25 MG PO TABS: 12.5000 mg | ORAL_TABLET | Freq: Four times a day (QID) | ORAL | Status: DC | PRN

## 2016-08-09 NOTE — Progress Notes (Signed)
PROGRESS NOTE    Jaime Phillips  [id number] DOB: 22-Nov-1960 DOA: 08/07/2016 PCP: [facility]   Brief Narrative:  Jaime Phillips is a 56 y.o. female with medical history significant for alcohol abuse, depression, iron deficiency anemia, and GERD who presents to the emergency department with fevers, malaise, cough, intermittent chest pain, and right shoulder pain after a fall at home yesterday. Patient reports that she developed fevers, malaise, and productive cough approximately 3 days ago after being with an ill grandchild a few days prior. She suffered a ground-level mechanical fall yesterday at home which she attributes to her leg length discrepancy. She fell onto an outstretched right arm and has been experiencing right shoulder pain ever since. She continue to drink alcohol less amount.   Assessment & Plan:   Principal Problem:   Chest pain Active Problems:   Normocytic anemia   Gastro-esophageal reflux   Cirrhosis (HCC)   Alcohol use disorder, severe, dependence (HCC)   Humeral head fracture, right, closed, initial encounter   Hyponatremia   Acute URI  1-Chest Pain;  Troponin mildly elevated. ECHO pending.  Initial EKG with sinus tachycardia.   2-Right humeral head fracture;  -CT :Comminuted and displaced surgical neck fracture of the right humerus involves the greater tuberosity. No displacement of greater tuberosity is present. The exam is otherwise negative. - Pt fell onto an outstretched right arm on 08/07/15 with immediate pain at the right shoulder  - Radiographs demonstrate a comminuted fracture involving humeral head  -Shoulder will be immobilized.  -ortho consulted. Planning surgery tomorrow.   3-URI; fever; Follow urine culture.  Respiratory panel negative.  WBC trending down.   4-Alcohol Abuse, cirrhosis;  Plan to monitor with CIWA and prn Ativan  Mildly elevated AST, consistent with alcohol intake. Mildly elevated bili. Monitor  for now.    5-Hyponatremia;  Improved with fluids.   Prolong QT;  Mg replaced.   Anemia;  Has been attributed to iron-deficiency, will continue oral supplementation   Hypokalemia;  Replete orally.   DVT prophylaxis: Lovenox Code Status: Full code.  Family Communication: care discussed with patient.  Disposition Plan:  To be determine. Remain inpatient for pain management, fluids, ECHO  Consultants:   Ortho    Procedures:   none   Antimicrobials:  none   Subjective: Complaining of shoulder pain.  Denies chest pain.   Objective: Vitals:   08/09/16 0456 08/09/16 0500 08/09/16 0808 08/09/16 1242  BP:  118/65 117/67 104/68  Pulse:  73 73 77  Resp:  18 18 18   Temp:  98.4 F (36.9 C) 98.8 F (37.1 C) 98.8 F (37.1 C)  TempSrc:  Oral Oral Oral  SpO2:  97% 97% 100%  Weight: 54.3 kg (119 lb 11.2 oz)     Height:        Intake/Output Summary (Last 24 hours) at 08/09/16 1427 Last data filed at 08/09/16 1030  Gross per 24 hour  Intake              855 ml  Output              100 ml  Net              755 ml   Filed Weights   08/07/16 1918 08/08/16 0252 08/09/16 0456  Weight: 49.9 kg (110 lb) 52.9 kg (116 lb 11.2 oz) 54.3 kg (119 lb 11.2 oz)    Examination:  General exam: Appears calm and comfortable  Respiratory system: Clear to  auscultation. Respiratory effort normal. Cardiovascular system: S1 & S2 heard, RRR. No JVD, murmurs, rubs, gallops or clicks. No pedal edema. Gastrointestinal system: Abdomen is nondistended, soft and nontender. No organomegaly or masses felt. Normal bowel sounds heard. Central nervous system: Alert and oriented. No focal neurological deficits. Extremities: Symmetric 4 x 5 power. Right arm in sling.  Skin: No rashes, lesions or ulcers Psychiatry: Judgement and insight appear normal. Mood & affect appropriate.     Data Reviewed: I have personally reviewed following labs and imaging studies  CBC:  Recent Labs Lab  08/07/16 1934 08/08/16 0725 08/09/16 0211  WBC 13.1* 9.9 7.8  HGB 11.8* 9.2* 8.4*  HCT 33.3* 26.9* 24.8*  MCV 97.1 99.6 100.0  PLT 165 105* 95*   Basic Metabolic Panel:  Recent Labs Lab 08/07/16 1934 08/08/16 0647 08/08/16 0725 08/09/16 0211  NA 133* 135  --  135  K 3.7 3.7  --  2.9*  CL 93* 99*  --  102  CO2 24 26  --  25  GLUCOSE 125* 100*  --  136*  BUN 11 9  --  5*  CREATININE 0.72 0.61  --  0.58  CALCIUM 9.3 8.4*  --  8.3*  MG  --   --  1.4* 2.0   GFR: Estimated Creatinine Clearance: 65 mL/min (by C-G formula based on SCr of 0.58 mg/dL). Liver Function Tests:  Recent Labs Lab 08/07/16 2011  AST 71*  ALT 28  ALKPHOS 88  BILITOT 1.8*  PROT 7.7  ALBUMIN 4.6   No results for input(s): LIPASE, AMYLASE in the last 168 hours. No results for input(s): AMMONIA in the last 168 hours. Coagulation Profile: No results for input(s): INR, PROTIME in the last 168 hours. Cardiac Enzymes:  Recent Labs Lab 08/08/16 0114 08/08/16 0647 08/08/16 1121  TROPONINI 0.03* 0.08* 0.03*   BNP (last 3 results) No results for input(s): PROBNP in the last 8760 hours. HbA1C: No results for input(s): HGBA1C in the last 72 hours. CBG: No results for input(s): GLUCAP in the last 168 hours. Lipid Profile: No results for input(s): CHOL, HDL, LDLCALC, TRIG, CHOLHDL, LDLDIRECT in the last 72 hours. Thyroid Function Tests: No results for input(s): TSH, T4TOTAL, FREET4, T3FREE, THYROIDAB in the last 72 hours. Anemia Panel: No results for input(s): VITAMINB12, FOLATE, FERRITIN, TIBC, IRON, RETICCTPCT in the last 72 hours. Sepsis Labs: No results for input(s): PROCALCITON, LATICACIDVEN in the last 168 hours.  Recent Results (from the past 240 hour(s))  Respiratory Panel by PCR     Status: None   Collection Time: 08/08/16  1:59 AM  Result Value Ref Range Status   Adenovirus NOT DETECTED NOT DETECTED Final   Coronavirus 229E NOT DETECTED NOT DETECTED Final   Coronavirus HKU1 NOT  DETECTED NOT DETECTED Final   Coronavirus NL63 NOT DETECTED NOT DETECTED Final   Coronavirus OC43 NOT DETECTED NOT DETECTED Final   Metapneumovirus NOT DETECTED NOT DETECTED Final   Rhinovirus / Enterovirus NOT DETECTED NOT DETECTED Final   Influenza A NOT DETECTED NOT DETECTED Final   Influenza B NOT DETECTED NOT DETECTED Final   Parainfluenza Virus 1 NOT DETECTED NOT DETECTED Final   Parainfluenza Virus 2 NOT DETECTED NOT DETECTED Final   Parainfluenza Virus 3 NOT DETECTED NOT DETECTED Final   Parainfluenza Virus 4 NOT DETECTED NOT DETECTED Final   Respiratory Syncytial Virus NOT DETECTED NOT DETECTED Final   Bordetella pertussis NOT DETECTED NOT DETECTED Final   Chlamydophila pneumoniae NOT DETECTED NOT DETECTED Final  Mycoplasma pneumoniae NOT DETECTED NOT DETECTED Final  Urine culture     Status: Abnormal   Collection Time: 08/08/16  7:16 AM  Result Value Ref Range Status   Specimen Description URINE, RANDOM  Final   Special Requests NONE  Final   Culture MULTIPLE SPECIES PRESENT, SUGGEST RECOLLECTION (A)  Final   Report Status 08/09/2016 FINAL  Final         Radiology Studies: Dg Chest 2 View  Result Date: 08/07/2016 CLINICAL DATA:  Fall yesterday, right shoulder pain since fall, chest pain today. Smoker. EXAM: CHEST  2 VIEW COMPARISON:  03/11/2015 FINDINGS: Normal mediastinum and cardiac silhouette. Normal pulmonary vasculature. No evidence of effusion, infiltrate, or pneumothorax. There is acute fracture through the proximal humerus at the surgical neck. Multiple comminuted fragments. The humeral head is displaced inferior consistent with hemarthrosis in the glenohumeral joint. No dislocation evident. IMPRESSION: Comminuted fracture of the proximal RIGHT humerus. No acute cardiopulmonary process. Electronically Signed   By: Suzy Bouchard M.D.   On: 08/07/2016 20:09   Dg Shoulder Right  Result Date: 08/07/2016 CLINICAL DATA:  56 year old who fell yesterday and injured the  right shoulder. She presents with pain. Initial encounter. EXAM: RIGHT SHOULDER - 2+ VIEW COMPARISON:  None. FINDINGS: Comminuted multipart fracture involving the right humeral head and neck. The greater tuberosity is present as a free fragment. Large joint effusion/hemarthrosis likely accounts for slight inferior subluxation of the glenohumeral joint. Acromioclavicular joint intact. IMPRESSION: Comminuted multipart fracture involving the right humeral head and neck. Electronically Signed   By: Evangeline Dakin M.D.   On: 08/07/2016 20:07   Ct Shoulder Right Wo Contrast  Result Date: 08/08/2016 CLINICAL DATA:  Status post fall 08/06/2016 with a fracture of the right humerus. Pain and limited range of motion. Initial encounter. EXAM: CT OF THE UPPER RIGHT EXTREMITY WITHOUT CONTRAST TECHNIQUE: Multidetector CT imaging of the upper right extremity was performed according to the standard protocol. COMPARISON:  Plain films the right shoulder 08/07/2016. FINDINGS: Bones/Joint/Cartilage The patient has a comminuted surgical neck fracture of the right humerus. The distal fragment demonstrates 1 shaft width medial displacement. The humeral head is rotated medially due retraction of the rotator cuff. The fracture shows slight anterior displacement. It extends into the greater tuberosity with mild comminution but no displacement. The lesser tuberosity is spared. The humeral head is located. The acromioclavicular joint is intact. Ligaments Suboptimally assessed by CT. Muscles and Tendons Rotator cuff appears intact. Soft tissues Hemorrhage is seen about the patient's fracture. Imaged lung parenchyma is clear. IMPRESSION: Comminuted and displaced surgical neck fracture of the right humerus involves the greater tuberosity. No displacement of greater tuberosity is present. The exam is otherwise negative. Electronically Signed   By: Inge Rise M.D.   On: 08/08/2016 09:17        Scheduled Meds: . aspirin EC  81 mg  Oral Daily  . enoxaparin (LOVENOX) injection  30 mg Subcutaneous Q24H  . famotidine  20 mg Oral BID  . ferrous sulfate  325 mg Oral Q breakfast  . folic acid  1 mg Oral Daily  . LORazepam  0-4 mg Intravenous Q6H   Followed by  . [START ON 08/10/2016] LORazepam  0-4 mg Intravenous Q12H  . multivitamin with minerals  1 tablet Oral Daily  . polyethylene glycol  17 g Oral BID  . thiamine  100 mg Oral Daily   Continuous Infusions: . sodium chloride 75 mL/hr at 08/08/16 1510     LOS: 1 day  Time spent: 35 minutes.     Elmarie Shiley, MD [facility] Pager [phone]  If 7PM-7AM, please contact night-coverage [email or web address] Password [email or web address] 08/09/2016, 2:27 PM

## 2016-08-09 NOTE — Progress Notes (Signed)
Echocardiogram 2D Echocardiogram has been performed.  Jaime Phillips 08/09/2016, 12:30 PM

## 2016-08-09 NOTE — Evaluation (Signed)
Occupational Therapy Evaluation Patient Details Name: Jaime Phillips MRN: [id number] DOB: 05/01/1961 Today's Date: 08/09/2016    History of Present Illness  56 yo female s/p fall with Right proximal humerus fracture pending surgery per Dr Percell Miller latest notes. Past Medical History:  Diagnosis Date  . Abnormal uterine bleeding   . Alcoholism ([place])   . Arthritis   . Chronic pain syndrome   . Depression   . Diverticulosis   . Headache(784.0)   . Hiatal hernia   . Hyperplastic colon polyp   . Hypopotassemia   . Internal hemorrhoids   . LFT elevation   . Macrocytic anemia   . Mallory-Weiss tear   . Potassium (K) deficiency   . PUD (peptic ulcer disease)   . Rectal bleeding    minor       Clinical Impression   PT admitted with R humerus fx with pending surg with Dr Percell Miller. Pt currently with functional limitiations due to the deficits listed below (see OT problem list). PTA was independent using a cane for d/c.  Pt will benefit from skilled OT to increase their independence and safety with adls and balance to allow discharge [place]. Pt currently with multiple falls and requesting PT consult. .     Follow Up Recommendations  Home health OT (pending progress)    Equipment Recommendations  None recommended by OT    Recommendations for Other Services PT consult     Precautions / Restrictions Precautions Precautions: Fall Precaution Comments: NWB R shoulder Required Braces or Orthoses: Sling      Mobility Bed Mobility Overal bed mobility: Independent                Transfers Overall transfer level: Needs assistance   Transfers: Sit to/from Stand Sit to Stand: Min assist         General transfer comment: cues for R UE placement    Balance Overall balance assessment: Needs assistance;History of Falls         Standing balance support: Single extremity supported;During functional activity Standing balance-Leahy Scale: Fair                               ADL Overall ADL's : Needs assistance/impaired Eating/Feeding: Set up;Sitting   Grooming: Oral care;Set up;Sitting Grooming Details (indicate cue type and reason): lacks awareness to movement with R UE. Pt attempting to use  Upper Body Bathing: Moderate assistance;Sitting   Lower Body Bathing: Min guard;Sit to/from stand   Upper Body Dressing : Moderate assistance   Lower Body Dressing: Moderate assistance Lower Body Dressing Details (indicate cue type and reason): requires (A) to thread mesh underwear but able to pull them up with L UE Toilet Transfer: Minimal assistance Toilet Transfer Details (indicate cue type and reason): requires Min (A) for balance         Functional mobility during ADLs: Minimal assistance General ADL Comments: Pt reports multiple falls so requesting PT consult     Vision     Perception     Praxis      Pertinent Vitals/Pain Pain Assessment: Faces Faces Pain Scale: Hurts even more Pain Location: R shoulder- supine and sitting does not demonstrate any discomfort Pain Intervention(s): Limited activity within patient's tolerance     Hand Dominance Right   Extremity/Trunk Assessment Upper Extremity Assessment Upper Extremity Assessment: RUE deficits/detail RUE Deficits / Details: pending surg 08/10/16   Lower Extremity Assessment Lower Extremity Assessment: Defer to  PT evaluation (leg length discrepancy)   Cervical / Trunk Assessment Cervical / Trunk Assessment: Normal   Communication Communication Communication: No difficulties   Cognition Arousal/Alertness: Awake/alert Behavior During Therapy: Impulsive Overall Cognitive Status: No family/caregiver present to determine baseline cognitive functioning                 General Comments: Pt inconsistent and requires redireciton during session. pt unable to recall MD name but states "they gave me medication thats why i dont know"    General Comments       Exercises        Shoulder Instructions      Home Living Family/patient expects to be discharged to:: Private residence Living Arrangements: Children Available Help at Discharge: Available PRN/intermittently Type of Home: House Home Access: Level entry     Home Layout: One level     Bathroom Shower/Tub: Teacher, early years/pre: Handicapped height     Home Equipment: [place] - single point   Additional Comments: reports to OT that son can (A) upon dc home. When leadership entered the room reports that she isnt' sure because son travels for his job and she is going ot need help. Pt inconsistent throughout session in providing details for home       Prior Functioning/Environment Level of Independence: Independent with assistive device(s)        Comments: reports since hip surg requires cane        OT Problem List: Decreased strength;Decreased activity tolerance;Impaired balance (sitting and/or standing);Decreased cognition;Decreased safety awareness;Decreased knowledge of use of DME or AE;Decreased knowledge of precautions;Pain;Impaired UE functional use   OT Treatment/Interventions: Self-care/ADL training;Therapeutic exercise;DME and/or AE instruction;Therapeutic activities;Cognitive remediation/compensation;Balance training;Patient/family education    OT Goals(Current goals can be found in the care plan section) Acute Rehab OT Goals Patient Stated Goal: to go home tomorrow  OT Goal Formulation: With patient Time For Goal Achievement: 08/23/16 Potential to Achieve Goals: Good  OT Frequency: Min 2X/week   Barriers to D/C: Decreased caregiver support          Co-evaluation              End of Session Equipment Utilized During Treatment: Gait belt Nurse Communication: Mobility status;Precautions  Activity Tolerance: Patient tolerated treatment well Patient left: in chair;with call bell/phone within reach;with chair alarm set   Time: [id number] OT Time  Calculation (min): 30 min Charges:  OT General Charges $OT Visit: 1 Procedure OT Evaluation $OT Eval Moderate Complexity: 1 Procedure OT Treatments $Self Care/Home Management : 8-22 mins G-Codes:    Parke Poisson B 2016-08-19, 10:18 AM

## 2016-08-09 NOTE — Consult Note (Signed)
ORTHOPAEDIC CONSULTATION  REQUESTING PHYSICIAN: Elmarie Shiley, MD  Chief Complaint: Right shoulder injury  HPI: Jaime Phillips is a 56 y.o. female who complains of  a fall at home yesterday this is a mechanical fall. She reports that she has fallen a few times and uses a cane due to "leg length discrepancy" she has had both hips replaced no other complaints  Past Medical History:  Diagnosis Date  . Abnormal uterine bleeding   . Alcoholism ([place])   . Arthritis   . Chronic pain syndrome   . Depression   . Diverticulosis   . Headache(784.0)   . Hiatal hernia   . Hyperplastic colon polyp   . Hypopotassemia   . Internal hemorrhoids   . LFT elevation   . Macrocytic anemia   . Mallory-Weiss tear   . Potassium (K) deficiency   . PUD (peptic ulcer disease)   . Rectal bleeding    minor    Past Surgical History:  Procedure Laterality Date  . APPENDECTOMY    . COLONOSCOPY    . ESOPHAGOGASTRODUODENOSCOPY N/A 02/06/2014   Procedure: ESOPHAGOGASTRODUODENOSCOPY (EGD);  Surgeon: Milus Banister, MD;  Location: [facility];  Service: Endoscopy;  Laterality: N/A;  . OOPHORECTOMY    . ORIF PERIPROSTHETIC FRACTURE Right 05/28/2013   Procedure: OPEN REDUCTION INTERNAL FIXATION (ORIF) PERIPROSTHETIC FRACTURE;  Surgeon: Mauri Pole, MD;  Location: [facility];  Service: Orthopedics;  Laterality: Right;  . SHOULDER SURGERY Left   . TOTAL HIP ARTHROPLASTY Right 04/10/2013   Procedure: RIGHT TOTAL HIP ARTHROPLASTY ANTERIOR APPROACH;  Surgeon: Mauri Pole, MD;  Location: [facility];  Service: Orthopedics;  Laterality: Right;  . TOTAL HIP ARTHROPLASTY     left hip   Social History   Social History  . Marital status: Single    Spouse name: N/A  . Number of children: 1  . Years of education: N/A   Occupational History  . disabled    Social History Main Topics  . Smoking status: Current Every Day Smoker    Packs/day: 0.50    Years: 25.00    Types: Cigarettes  . Smokeless tobacco:  Never Used     Comment: Tobacco info given to pt. 08/16/12  . Alcohol use 1.2 - 1.8 oz/week    2 - 3 Shots of liquor per week     Comment: pt quit drinking 2 months ago (today is 03/11/15)  . Drug use: No  . Sexual activity: Not Currently   Other Topics Concern  . None   Social History Narrative  . None   Family History  Problem Relation Age of Onset  . Breast cancer Sister   . Diabetes Mother   . Kidney disease Mother   . Heart disease Father   . Colon cancer Father     questionable   Allergies  Allergen Reactions  . Morphine And Related Nausea Only and Anxiety   Prior to Admission medications   Medication Sig Start Date End Date Taking? Authorizing Provider  aspirin 325 MG tablet Take 325 mg by mouth daily.   Yes Historical Provider, MD  ferrous sulfate 325 (65 FE) MG tablet Take 325 mg by mouth daily with breakfast.   Yes Historical Provider, MD  ibuprofen (ADVIL,MOTRIN) 800 MG tablet Take 1 tablet (800 mg total) by mouth every 8 (eight) hours as needed for headache, mild pain or moderate pain. 11/07/15  Yes Encarnacion Slates, NP  Multiple Vitamin (MULTIVITAMIN WITH MINERALS) TABS tablet Take  1 tablet by mouth daily.   Yes Historical Provider, MD   Dg Chest 2 View  Result Date: 08/07/2016 CLINICAL DATA:  Fall yesterday, right shoulder pain since fall, chest pain today. Smoker. EXAM: CHEST  2 VIEW COMPARISON:  03/11/2015 FINDINGS: Normal mediastinum and cardiac silhouette. Normal pulmonary vasculature. No evidence of effusion, infiltrate, or pneumothorax. There is acute fracture through the proximal humerus at the surgical neck. Multiple comminuted fragments. The humeral head is displaced inferior consistent with hemarthrosis in the glenohumeral joint. No dislocation evident. IMPRESSION: Comminuted fracture of the proximal RIGHT humerus. No acute cardiopulmonary process. Electronically Signed   By: Suzy Bouchard M.D.   On: 08/07/2016 20:09   Dg Shoulder Right  Result Date:  08/07/2016 CLINICAL DATA:  56 year old who fell yesterday and injured the right shoulder. She presents with pain. Initial encounter. EXAM: RIGHT SHOULDER - 2+ VIEW COMPARISON:  None. FINDINGS: Comminuted multipart fracture involving the right humeral head and neck. The greater tuberosity is present as a free fragment. Large joint effusion/hemarthrosis likely accounts for slight inferior subluxation of the glenohumeral joint. Acromioclavicular joint intact. IMPRESSION: Comminuted multipart fracture involving the right humeral head and neck. Electronically Signed   By: Evangeline Dakin M.D.   On: 08/07/2016 20:07   Ct Shoulder Right Wo Contrast  Result Date: 08/08/2016 CLINICAL DATA:  Status post fall 08/06/2016 with a fracture of the right humerus. Pain and limited range of motion. Initial encounter. EXAM: CT OF THE UPPER RIGHT EXTREMITY WITHOUT CONTRAST TECHNIQUE: Multidetector CT imaging of the upper right extremity was performed according to the standard protocol. COMPARISON:  Plain films the right shoulder 08/07/2016. FINDINGS: Bones/Joint/Cartilage The patient has a comminuted surgical neck fracture of the right humerus. The distal fragment demonstrates 1 shaft width medial displacement. The humeral head is rotated medially due retraction of the rotator cuff. The fracture shows slight anterior displacement. It extends into the greater tuberosity with mild comminution but no displacement. The lesser tuberosity is spared. The humeral head is located. The acromioclavicular joint is intact. Ligaments Suboptimally assessed by CT. Muscles and Tendons Rotator cuff appears intact. Soft tissues Hemorrhage is seen about the patient's fracture. Imaged lung parenchyma is clear. IMPRESSION: Comminuted and displaced surgical neck fracture of the right humerus involves the greater tuberosity. No displacement of greater tuberosity is present. The exam is otherwise negative. Electronically Signed   By: Inge Rise M.D.    On: 08/08/2016 09:17    Positive ROS: All other systems have been reviewed and were otherwise negative with the exception of those mentioned in the HPI and as above.  Labs cbc  Recent Labs  08/08/16 0725 08/09/16 0211  WBC 9.9 7.8  HGB 9.2* 8.4*  HCT 26.9* 24.8*  PLT 105* 95*    Labs inflam No results for input(s): CRP in the last 72 hours.  Invalid input(s): ESR  Labs coag No results for input(s): INR, PTT in the last 72 hours.  Invalid input(s): PT   Recent Labs  08/08/16 0647 08/09/16 0211  NA 135 135  K 3.7 2.9*  CL 99* 102  CO2 26 25  GLUCOSE 100* 136*  BUN 9 5*  CREATININE 0.61 0.58  CALCIUM 8.4* 8.3*    Physical Exam: Vitals:   08/08/16 2331 08/09/16 0500  BP: 108/63 118/65  Pulse: 82 73  Resp: 18 18  Temp: 99.1 F (37.3 C) 98.4 F (36.9 C)   General: Alert, no acute distress Cardiovascular: No pedal edema Respiratory: No cyanosis, no use of accessory  musculature GI: No organomegaly, abdomen is soft and non-tender Skin: No lesions in the area of chief complaint other than those listed below in MSK exam.  Neurologic: Sensation intact distally save for the below mentioned MSK exam Psychiatric: Patient is competent for consent with normal mood and affect Lymphatic: No axillary or cervical lymphadenopathy  MUSCULOSKELETAL:  Right upper extremity pain with range of motion at the shoulder compartments are soft neurovascularly intact Other extremities are atraumatic with painless ROM and NVI.  Assessment: Right proximal humerus fracture  Plan: Due to the displacement and her use of a cane here I would recommend ORIF of this fracture I have this scheduled for Tuesday I will continue to discuss it with her family the option up to her I did discuss the risks and benefits of operative versus nonoperative management with her  Please keep her nothing by mouth at midnight tonight   Edmonia Lynch D, MD Cell [phone]   08/09/2016 7:32  AM

## 2016-08-09 NOTE — Progress Notes (Signed)
Received consult for Equipment; pt is for ORIF; Attending MD please specify what equipment is needed; CM will follow for DCP; B Pennie Rushing [phone]

## 2016-08-09 NOTE — Progress Notes (Signed)
Called Dr. Percell Miller at patients request regarding pending surgery.  Patient wants to speak to Dr. Percell Miller again prior to surgery, says she is uncomfortable signing consent because she was so sleepy when he talked to her earlier in the day.  Discussed this with Dr. Percell Miller who stated her surgery was planned for 12 to 1 p.m. Tomorrow and he will come to floor and speak to her in the morning or down in the preop area.  Related this to patient who expressed satisfaction with this.

## 2016-08-10 ENCOUNTER — Encounter (HOSPITAL_COMMUNITY)
Admission: EM | Disposition: A | Discharge status: Home Health | Payer: Self-pay | Source: Home / Self Care | Attending: Internal Medicine

## 2016-08-10 ENCOUNTER — Encounter (HOSPITAL_COMMUNITY): Payer: Self-pay | Admitting: Certified Registered"

## 2016-08-10 ENCOUNTER — Inpatient Hospital Stay: Admit: 2016-08-10 | Payer: Medicaid Other | Admitting: Orthopedic Surgery

## 2016-08-10 ENCOUNTER — Inpatient Hospital Stay (HOSPITAL_COMMUNITY): Payer: Medicaid Other

## 2016-08-10 ENCOUNTER — Inpatient Hospital Stay (HOSPITAL_COMMUNITY): Payer: Medicaid Other | Admitting: Anesthesiology

## 2016-08-10 HISTORY — PX: ORIF HUMERUS FRACTURE: SHX2126

## 2016-08-10 LAB — CBC
HEMATOCRIT: 25.7 % — AB (ref 36.0–46.0)
Hemoglobin: 8.7 g/dL — ABNORMAL LOW (ref 12.0–15.0)
MCH: 34.4 pg — AB (ref 26.0–34.0)
MCHC: 33.9 g/dL (ref 30.0–36.0)
MCV: 101.6 fL — AB (ref 78.0–100.0)
PLATELETS: 109 10*3/uL — AB (ref 150–400)
RBC: 2.53 MIL/uL — AB (ref 3.87–5.11)
RDW: 13.3 % (ref 11.5–15.5)
WBC: 7.2 10*3/uL (ref 4.0–10.5)

## 2016-08-10 LAB — PROTIME-INR
INR: 0.97
Prothrombin Time: 12.9 seconds (ref 11.4–15.2)

## 2016-08-10 LAB — BASIC METABOLIC PANEL
ANION GAP: 5 (ref 5–15)
CO2: 27 mmol/L (ref 22–32)
Calcium: 9.4 mg/dL (ref 8.9–10.3)
Chloride: 104 mmol/L (ref 101–111)
Creatinine, Ser: 0.53 mg/dL (ref 0.44–1.00)
GFR calc Af Amer: >60 mL/min (ref >60)
GLUCOSE: 95 mg/dL (ref 65–99)
POTASSIUM: 4.6 mmol/L (ref 3.5–5.1)
Sodium: 136 mmol/L (ref 135–145)

## 2016-08-10 SURGERY — OPEN REDUCTION INTERNAL FIXATION (ORIF) PROXIMAL HUMERUS FRACTURE
Anesthesia: Regional | Site: Shoulder | Laterality: Right

## 2016-08-10 MED ORDER — POLYETHYLENE GLYCOL 3350 17 G PO PACK: 17.0000 g | PACK | Freq: Every day | ORAL | Status: DC | PRN

## 2016-08-10 MED ORDER — LIDOCAINE 2% (20 MG/ML) 5 ML SYRINGE
INTRAMUSCULAR | Status: AC
  Filled 2016-08-10: qty 5

## 2016-08-10 MED ORDER — ROPIVACAINE HCL 7.5 MG/ML IJ SOLN
INTRAMUSCULAR | Status: DC | PRN
  Administered 2016-08-10: 20 mL via PERINEURAL

## 2016-08-10 MED ORDER — HYDROMORPHONE HCL 2 MG/ML IJ SOLN
0.5000 mg | INTRAMUSCULAR | Status: DC | PRN
  Administered 2016-08-11 – 2016-08-12 (×2): 1 mg via INTRAVENOUS
  Filled 2016-08-10 (×2): qty 1

## 2016-08-10 MED ORDER — LACTATED RINGERS IV SOLN
INTRAVENOUS | Status: DC
  Administered 2016-08-10 (×2): via INTRAVENOUS

## 2016-08-10 MED ORDER — PHENYLEPHRINE 40 MCG/ML (10ML) SYRINGE FOR IV PUSH (FOR BLOOD PRESSURE SUPPORT)
PREFILLED_SYRINGE | INTRAVENOUS | Status: DC | PRN
Start: 2016-08-10 — End: 2016-08-10
  Administered 2016-08-10: 80 ug via INTRAVENOUS
  Administered 2016-08-10 (×2): 40 ug via INTRAVENOUS

## 2016-08-10 MED ORDER — METOCLOPRAMIDE HCL 5 MG/ML IJ SOLN: 5.0000 mg | Freq: Three times a day (TID) | INTRAMUSCULAR | Status: DC | PRN

## 2016-08-10 MED ORDER — PROMETHAZINE HCL 25 MG/ML IJ SOLN: 6.2500 mg | INTRAMUSCULAR | Status: DC | PRN

## 2016-08-10 MED ORDER — ONDANSETRON HCL 4 MG/2ML IJ SOLN
INTRAMUSCULAR | Status: AC
  Filled 2016-08-10: qty 2

## 2016-08-10 MED ORDER — OXYCODONE HCL 5 MG PO TABS
5.0000 mg | ORAL_TABLET | ORAL | Status: DC | PRN
Start: 2016-08-10 — End: 2016-08-12
  Administered 2016-08-10 – 2016-08-12 (×10): 10 mg via ORAL
  Filled 2016-08-10 (×10): qty 2

## 2016-08-10 MED ORDER — CELECOXIB 200 MG PO CAPS
200.0000 mg | ORAL_CAPSULE | Freq: Two times a day (BID) | ORAL | Status: DC
  Administered 2016-08-10 – 2016-08-12 (×4): 200 mg via ORAL
  Filled 2016-08-10 (×4): qty 1

## 2016-08-10 MED ORDER — HYDROMORPHONE HCL 1 MG/ML IJ SOLN: 0.2500 mg | INTRAMUSCULAR | Status: DC | PRN

## 2016-08-10 MED ORDER — METHOCARBAMOL 1000 MG/10ML IJ SOLN
500.0000 mg | Freq: Four times a day (QID) | INTRAMUSCULAR | Status: DC | PRN
  Filled 2016-08-10: qty 5

## 2016-08-10 MED ORDER — FENTANYL CITRATE (PF) 100 MCG/2ML IJ SOLN
INTRAMUSCULAR | Status: DC | PRN
  Administered 2016-08-10: 150 ug via INTRAVENOUS
  Administered 2016-08-10: 50 ug via INTRAVENOUS

## 2016-08-10 MED ORDER — ROCURONIUM BROMIDE 10 MG/ML (PF) SYRINGE
PREFILLED_SYRINGE | INTRAVENOUS | Status: DC | PRN
  Administered 2016-08-10: 40 mg via INTRAVENOUS

## 2016-08-10 MED ORDER — ONDANSETRON HCL 4 MG PO TABS: 4.0000 mg | ORAL_TABLET | Freq: Three times a day (TID) | ORAL | 0 refills | Status: DC | PRN

## 2016-08-10 MED ORDER — PROPOFOL 10 MG/ML IV BOLUS
INTRAVENOUS | Status: DC | PRN
  Administered 2016-08-10: 50 mg via INTRAVENOUS
  Administered 2016-08-10: 130 mg via INTRAVENOUS

## 2016-08-10 MED ORDER — FENTANYL CITRATE (PF) 100 MCG/2ML IJ SOLN
INTRAMUSCULAR | Status: AC
  Filled 2016-08-10: qty 2

## 2016-08-10 MED ORDER — PHENYLEPHRINE HCL 10 MG/ML IJ SOLN
INTRAMUSCULAR | Status: DC | PRN
  Administered 2016-08-10: 25 ug/min via INTRAVENOUS

## 2016-08-10 MED ORDER — PROPOFOL 10 MG/ML IV BOLUS
INTRAVENOUS | Status: AC
  Filled 2016-08-10: qty 20

## 2016-08-10 MED ORDER — DOCUSATE SODIUM 100 MG PO CAPS: 100.0000 mg | ORAL_CAPSULE | Freq: Two times a day (BID) | ORAL | 0 refills | Status: DC

## 2016-08-10 MED ORDER — DOCUSATE SODIUM 100 MG PO CAPS
100.0000 mg | ORAL_CAPSULE | Freq: Two times a day (BID) | ORAL | Status: DC
  Administered 2016-08-10 – 2016-08-12 (×3): 100 mg via ORAL
  Filled 2016-08-10 (×4): qty 1

## 2016-08-10 MED ORDER — KETOROLAC TROMETHAMINE 15 MG/ML IJ SOLN
15.0000 mg | Freq: Four times a day (QID) | INTRAMUSCULAR | Status: AC
  Administered 2016-08-10 – 2016-08-11 (×4): 15 mg via INTRAVENOUS
  Filled 2016-08-10 (×4): qty 1

## 2016-08-10 MED ORDER — METOCLOPRAMIDE HCL 5 MG PO TABS: 5.0000 mg | ORAL_TABLET | Freq: Three times a day (TID) | ORAL | Status: DC | PRN

## 2016-08-10 MED ORDER — CEFAZOLIN IN D5W 1 GM/50ML IV SOLN
1.0000 g | Freq: Four times a day (QID) | INTRAVENOUS | Status: AC
  Administered 2016-08-10 – 2016-08-11 (×3): 1 g via INTRAVENOUS
  Filled 2016-08-10 (×3): qty 50

## 2016-08-10 MED ORDER — LIDOCAINE 2% (20 MG/ML) 5 ML SYRINGE
INTRAMUSCULAR | Status: DC | PRN
  Administered 2016-08-10: 20 mg via INTRAVENOUS

## 2016-08-10 MED ORDER — MIDAZOLAM HCL 2 MG/2ML IJ SOLN
INTRAMUSCULAR | Status: AC
  Filled 2016-08-10: qty 2

## 2016-08-10 MED ORDER — SENNA 8.6 MG PO TABS
1.0000 | ORAL_TABLET | Freq: Two times a day (BID) | ORAL | Status: DC
  Administered 2016-08-10 – 2016-08-12 (×3): 8.6 mg via ORAL
  Filled 2016-08-10 (×4): qty 1

## 2016-08-10 MED ORDER — CEFAZOLIN SODIUM 1 G IJ SOLR
INTRAMUSCULAR | Status: AC
  Filled 2016-08-10: qty 20

## 2016-08-10 MED ORDER — OXYCODONE-ACETAMINOPHEN 5-325 MG PO TABS: 1.0000 | ORAL_TABLET | ORAL | 0 refills | Status: DC | PRN

## 2016-08-10 MED ORDER — MIDAZOLAM HCL 2 MG/2ML IJ SOLN: 0.5000 mg | Freq: Once | INTRAMUSCULAR | Status: DC | PRN

## 2016-08-10 MED ORDER — BUPIVACAINE-EPINEPHRINE (PF) 0.5% -1:200000 IJ SOLN
INTRAMUSCULAR | Status: DC | PRN
  Administered 2016-08-10: 10 mL via PERINEURAL

## 2016-08-10 MED ORDER — METHOCARBAMOL 500 MG PO TABS
500.0000 mg | ORAL_TABLET | Freq: Four times a day (QID) | ORAL | Status: DC | PRN
  Administered 2016-08-10 – 2016-08-12 (×4): 500 mg via ORAL
  Filled 2016-08-10 (×6): qty 1

## 2016-08-10 MED ORDER — METHOCARBAMOL 500 MG PO TABS: 500.0000 mg | ORAL_TABLET | Freq: Four times a day (QID) | ORAL | 0 refills | Status: DC | PRN

## 2016-08-10 MED ORDER — 0.9 % SODIUM CHLORIDE (POUR BTL) OPTIME
TOPICAL | Status: DC | PRN
  Administered 2016-08-10: 1000 mL

## 2016-08-10 MED ORDER — FENTANYL CITRATE (PF) 100 MCG/2ML IJ SOLN
100.0000 ug | Freq: Once | INTRAMUSCULAR | Status: AC
  Administered 2016-08-10: 100 ug via INTRAVENOUS

## 2016-08-10 MED ORDER — MIDAZOLAM HCL 2 MG/2ML IJ SOLN
2.0000 mg | Freq: Once | INTRAMUSCULAR | Status: AC
  Administered 2016-08-10: 2 mg via INTRAVENOUS

## 2016-08-10 MED ORDER — MEPERIDINE HCL 25 MG/ML IJ SOLN: 6.2500 mg | INTRAMUSCULAR | Status: DC | PRN

## 2016-08-10 MED ORDER — SUGAMMADEX SODIUM 200 MG/2ML IV SOLN
INTRAVENOUS | Status: DC | PRN
  Administered 2016-08-10: 150 mg via INTRAVENOUS

## 2016-08-10 SURGICAL SUPPLY — 71 items
BENZOIN TINCTURE PRP APPL 2/3 (GAUZE/BANDAGES/DRESSINGS) IMPLANT
BIT DRILL 3.2 (BIT) ×2
BIT DRILL 3.2XCALB NS DISP (BIT) ×1 IMPLANT
BIT DRILL CALIBRATED 2.7 (BIT) ×2 IMPLANT
BIT DRILL CALIBRATED 2.7MM (BIT) ×1
BIT DRL 3.2XCALB NS DISP (BIT) ×1
CLOSURE STERI-STRIP 1/2X4 (GAUZE/BANDAGES/DRESSINGS) ×1
CLOSURE WOUND 1/2 X4 (GAUZE/BANDAGES/DRESSINGS)
CLSR STERI-STRIP ANTIMIC 1/2X4 (GAUZE/BANDAGES/DRESSINGS) ×2 IMPLANT
COVER SURGICAL LIGHT HANDLE (MISCELLANEOUS) ×3 IMPLANT
DRAPE C-ARM 42X72 X-RAY (DRAPES) ×3 IMPLANT
DRAPE IMP U-DRAPE 54X76 (DRAPES) ×3 IMPLANT
DRAPE INCISE IOBAN 66X45 STRL (DRAPES) ×3 IMPLANT
DRAPE ORTHO SPLIT 77X108 STRL (DRAPES) ×4
DRAPE SURG ORHT 6 SPLT 77X108 (DRAPES) ×2 IMPLANT
DRAPE U-SHAPE 47X51 STRL (DRAPES) ×3 IMPLANT
DRSG MEPILEX BORDER 4X8 (GAUZE/BANDAGES/DRESSINGS) ×3 IMPLANT
DRSG PAD ABDOMINAL 8X10 ST (GAUZE/BANDAGES/DRESSINGS) ×3 IMPLANT
DURAPREP 26ML APPLICATOR (WOUND CARE) IMPLANT
ELECT REM PT RETURN 9FT ADLT (ELECTROSURGICAL) ×3
ELECTRODE REM PT RTRN 9FT ADLT (ELECTROSURGICAL) ×1 IMPLANT
GAUZE SPONGE 4X4 12PLY STRL (GAUZE/BANDAGES/DRESSINGS) IMPLANT
GAUZE XEROFORM 1X8 LF (GAUZE/BANDAGES/DRESSINGS) IMPLANT
GLOVE BIO SURGEON STRL SZ7.5 (GLOVE) ×9 IMPLANT
GLOVE BIOGEL PI IND STRL 8 (GLOVE) ×2 IMPLANT
GLOVE BIOGEL PI INDICATOR 8 (GLOVE) ×4
GLOVE BIOGEL PI ORTHO PRO SZ7 (GLOVE) ×2
GLOVE PI ORTHO PRO STRL SZ7 (GLOVE) ×1 IMPLANT
GLOVE SURG SS PI 6.5 STRL IVOR (GLOVE) ×3 IMPLANT
GOWN STRL REUS W/ TWL LRG LVL3 (GOWN DISPOSABLE) ×3 IMPLANT
GOWN STRL REUS W/ TWL XL LVL3 (GOWN DISPOSABLE) ×1 IMPLANT
GOWN STRL REUS W/TWL LRG LVL3 (GOWN DISPOSABLE) ×6
GOWN STRL REUS W/TWL XL LVL3 (GOWN DISPOSABLE) ×2
K-WIRE 2X5 SS THRDED S3 (WIRE) ×3
KIT BASIN OR (CUSTOM PROCEDURE TRAY) ×3 IMPLANT
KIT ROOM TURNOVER OR (KITS) ×3 IMPLANT
KWIRE 2X5 SS THRDED S3 (WIRE) ×1 IMPLANT
MANIFOLD NEPTUNE II (INSTRUMENTS) ×3 IMPLANT
NEEDLE HYPO 25GX1X1/2 BEV (NEEDLE) IMPLANT
NS IRRIG 1000ML POUR BTL (IV SOLUTION) ×3 IMPLANT
PACK SHOULDER (CUSTOM PROCEDURE TRAY) ×3 IMPLANT
PACK UNIVERSAL I (CUSTOM PROCEDURE TRAY) IMPLANT
PAD ARMBOARD 7.5X6 YLW CONV (MISCELLANEOUS) ×6 IMPLANT
PEG LOCKING 3.2MMX44 (Peg) ×6 IMPLANT
PEG LOCKING 3.2X34 (Screw) ×3 IMPLANT
PEG LOCKING 3.2X38 (Screw) ×3 IMPLANT
PEG LOCKING 3.2X40 (Peg) ×3 IMPLANT
PEG LOCKING 3.2X50 (Screw) ×3 IMPLANT
PLATE PROX HUM LO R 4H 83 (Plate) ×3 IMPLANT
SCREW PEG LOCK 3.2X30MM (Screw) ×3 IMPLANT
SCREW T15 MD 3.5X26MM NS (Screw) ×9 IMPLANT
SLEEVE MEASURING 3.2 (BIT) ×3 IMPLANT
SLING ARM FOAM STRAP MED (SOFTGOODS) ×3 IMPLANT
SPONGE LAP 4X18 X RAY DECT (DISPOSABLE) IMPLANT
STAPLER VISISTAT 35W (STAPLE) IMPLANT
STRIP CLOSURE SKIN 1/2X4 (GAUZE/BANDAGES/DRESSINGS) IMPLANT
SUCTION FRAZIER HANDLE 10FR (MISCELLANEOUS) ×2
SUCTION TUBE FRAZIER 10FR DISP (MISCELLANEOUS) ×1 IMPLANT
SUPPORT WRAP ARM LG (MISCELLANEOUS) IMPLANT
SUT FIBERWIRE #2 38 T-5 BLUE (SUTURE) ×6
SUT MNCRL AB 4-0 PS2 18 (SUTURE) ×3 IMPLANT
SUT MON AB 2-0 CT1 27 (SUTURE) ×3 IMPLANT
SUT VIC AB 0 CTB1 27 (SUTURE) IMPLANT
SUTURE FIBERWR #2 38 T-5 BLUE (SUTURE) ×2 IMPLANT
SYR BULB IRRIGATION 50ML (SYRINGE) ×3 IMPLANT
SYR CONTROL 10ML LL (SYRINGE) IMPLANT
TAPE CLOTH SURG 4X10 WHT LF (GAUZE/BANDAGES/DRESSINGS) ×3 IMPLANT
TOWEL OR 17X24 6PK STRL BLUE (TOWEL DISPOSABLE) ×3 IMPLANT
TOWEL OR 17X26 10 PK STRL BLUE (TOWEL DISPOSABLE) ×3 IMPLANT
TOWEL OR NON WOVEN STRL DISP B (DISPOSABLE) IMPLANT
WATER STERILE IRR 1000ML POUR (IV SOLUTION) ×3 IMPLANT

## 2016-08-10 NOTE — Interval H&P Note (Signed)
History and Physical Interval Note:  08/10/2016 8:45 AM  Jaime Phillips  has presented today for surgery, with the diagnosis of proximal humerus fracture right  The various methods of treatment have been discussed with the patient and family. After consideration of risks, benefits and other options for treatment, the patient has consented to  Procedure(s): OPEN REDUCTION INTERNAL FIXATION (ORIF) PROXIMAL HUMERUS FRACTURE (Right) as a surgical intervention .  The patient's history has been reviewed, patient examined, no change in status, stable for surgery.  I have reviewed the patient's chart and labs.  Questions were answered to the patient's satisfaction.     MURPHY, TIMOTHY D

## 2016-08-10 NOTE — Progress Notes (Signed)
Orthopedic Tech Progress Note Patient Details:  Jaime Phillips 1961/07/27 [id number] Replacement sling Ortho Devices Type of Ortho Device: Shoulder immobilizer Ortho Device/Splint Location: RUE  Ortho Device/Splint Interventions: Criss Alvine 08/10/2016, 8:53 PM

## 2016-08-10 NOTE — Progress Notes (Signed)
PROGRESS NOTE    Jaime Phillips  [id number] DOB: [birth year] DOA: 08/07/2016 PCP: [facility]   Brief Narrative:  Jaime Phillips is a 56 y.o. female with medical history significant for alcohol abuse, depression, iron deficiency anemia, and GERD who presents to the emergency department with fevers, malaise, cough, intermittent chest pain, and right shoulder pain after a fall at home yesterday. Patient reports that she developed fevers, malaise, and productive cough approximately 3 days ago after being with an ill grandchild a few days prior. She suffered a ground-level mechanical fall yesterday at home which she attributes to her leg length discrepancy. She fell onto an outstretched right arm and has been experiencing right shoulder pain ever since. She continue to drink alcohol less amount.   Assessment & Plan:   Principal Problem:   Chest pain Active Problems:   Normocytic anemia   Gastro-esophageal reflux   Cirrhosis (HCC)   Alcohol use disorder, severe, dependence (HCC)   Humeral head fracture, right, closed, initial encounter   Hyponatremia   Acute URI  1-Chest Pain; unlikely cardiac related.  Troponin mildly elevated. ECHO Normal EF.  Initial EKG with sinus tachycardia.   2-Right humeral head fracture;  -CT :Comminuted and displaced surgical neck fracture of the right humerus involves the greater tuberosity. No displacement of greater tuberosity is present. The exam is otherwise negative. - Pt fell onto an outstretched right arm on 08/07/15 with immediate pain at the right shoulder  - Radiographs demonstrate a comminuted fracture involving humeral head  -Shoulder will be immobilized.  -ortho consulted. Planning surgery today   3-URI; fever; urine culture multiples bacterial morphotype.  Respiratory panel negative.  WBC trending down.   4-Alcohol Abuse, cirrhosis;  Plan to monitor with CIWA and prn Ativan  Mildly elevated AST, consistent with  alcohol intake. Mildly elevated bili. Monitor for now.  Check INR prior to sx   5-Hyponatremia;  Improved with fluids.   Prolong QT;  Mg replaced.   Anemia;  Has been attributed to iron-deficiency, will continue oral supplementation   Hypokalemia;  Resolved.   DVT prophylaxis: Lovenox Code Status: Full code.  Family Communication: care discussed with patient.  Disposition Plan:  To be determine. Remain inpatient for pain management, fluids, ECHO  Consultants:   Ortho    Procedures:   none   Antimicrobials:  none   Subjective: Complaining of shoulder pain.  Denies chest pain or dyspnea.   Objective: Vitals:   08/09/16 0808 08/09/16 1242 08/09/16 2349 08/10/16 0436  BP: 117/67 104/68 119/65 121/69  Pulse: 73 77 72 70  Resp: 18 18 18 18   Temp: 98.8 F (37.1 C) 98.8 F (37.1 C) 99.1 F (37.3 C) 98.5 F (36.9 C)  TempSrc: Oral Oral Oral Oral  SpO2: 97% 100% 100% 97%  Weight:    53.5 kg (117 lb 14.4 oz)  Height:        Intake/Output Summary (Last 24 hours) at 08/10/16 1012 Last data filed at 08/10/16 0940  Gross per 24 hour  Intake           328.17 ml  Output             1750 ml  Net         -1421.83 ml   Filed Weights   08/08/16 0252 08/09/16 0456 08/10/16 0436  Weight: 52.9 kg (116 lb 11.2 oz) 54.3 kg (119 lb 11.2 oz) 53.5 kg (117 lb 14.4 oz)    Examination:  General exam: Appears  calm and comfortable  Respiratory system: Clear to auscultation. Respiratory effort normal. Cardiovascular system: S1 & S2 heard, RRR. No JVD, murmurs, rubs, gallops or clicks. No pedal edema. Gastrointestinal system: Abdomen is nondistended, soft and nontender. No organomegaly or masses felt. Normal bowel sounds heard. Central nervous system: Alert and oriented. No focal neurological deficits. Extremities: Symmetric 4 x 5 power. Right arm in sling.  Skin: No rashes, lesions or ulcers Psychiatry: Judgement and insight appear normal. Mood & affect appropriate.      Data Reviewed: I have personally reviewed following labs and imaging studies  CBC:  Recent Labs Lab 08/07/16 1934 08/08/16 0725 08/09/16 0211 08/10/16 0316  WBC 13.1* 9.9 7.8 7.2  HGB 11.8* 9.2* 8.4* 8.7*  HCT 33.3* 26.9* 24.8* 25.7*  MCV 97.1 99.6 100.0 101.6*  PLT 165 105* 95* 0000000*   Basic Metabolic Panel:  Recent Labs Lab 08/07/16 1934 08/08/16 0647 08/08/16 0725 08/09/16 0211 08/10/16 0316  NA 133* 135  --  135 136  K 3.7 3.7  --  2.9* 4.6  CL 93* 99*  --  102 104  CO2 24 26  --  25 27  GLUCOSE 125* 100*  --  136* 95  BUN 11 9  --  5* <5*  CREATININE 0.72 0.61  --  0.58 0.53  CALCIUM 9.3 8.4*  --  8.3* 9.4  MG  --   --  1.4* 2.0  --    GFR: Estimated Creatinine Clearance: 65 mL/min (by C-G formula based on SCr of 0.53 mg/dL). Liver Function Tests:  Recent Labs Lab 08/07/16 2011  AST 71*  ALT 28  ALKPHOS 88  BILITOT 1.8*  PROT 7.7  ALBUMIN 4.6   No results for input(s): LIPASE, AMYLASE in the last 168 hours. No results for input(s): AMMONIA in the last 168 hours. Coagulation Profile: No results for input(s): INR, PROTIME in the last 168 hours. Cardiac Enzymes:  Recent Labs Lab 08/08/16 0114 08/08/16 0647 08/08/16 1121  TROPONINI 0.03* 0.08* 0.03*   BNP (last 3 results) No results for input(s): PROBNP in the last 8760 hours. HbA1C: No results for input(s): HGBA1C in the last 72 hours. CBG: No results for input(s): GLUCAP in the last 168 hours. Lipid Profile: No results for input(s): CHOL, HDL, LDLCALC, TRIG, CHOLHDL, LDLDIRECT in the last 72 hours. Thyroid Function Tests: No results for input(s): TSH, T4TOTAL, FREET4, T3FREE, THYROIDAB in the last 72 hours. Anemia Panel: No results for input(s): VITAMINB12, FOLATE, FERRITIN, TIBC, IRON, RETICCTPCT in the last 72 hours. Sepsis Labs: No results for input(s): PROCALCITON, LATICACIDVEN in the last 168 hours.  Recent Results (from the past 240 hour(s))  Respiratory Panel by PCR      Status: None   Collection Time: 08/08/16  1:59 AM  Result Value Ref Range Status   Adenovirus NOT DETECTED NOT DETECTED Final   Coronavirus 229E NOT DETECTED NOT DETECTED Final   Coronavirus HKU1 NOT DETECTED NOT DETECTED Final   Coronavirus NL63 NOT DETECTED NOT DETECTED Final   Coronavirus OC43 NOT DETECTED NOT DETECTED Final   Metapneumovirus NOT DETECTED NOT DETECTED Final   Rhinovirus / Enterovirus NOT DETECTED NOT DETECTED Final   Influenza A NOT DETECTED NOT DETECTED Final   Influenza B NOT DETECTED NOT DETECTED Final   Parainfluenza Virus 1 NOT DETECTED NOT DETECTED Final   Parainfluenza Virus 2 NOT DETECTED NOT DETECTED Final   Parainfluenza Virus 3 NOT DETECTED NOT DETECTED Final   Parainfluenza Virus 4 NOT DETECTED NOT DETECTED Final  Respiratory Syncytial Virus NOT DETECTED NOT DETECTED Final   Bordetella pertussis NOT DETECTED NOT DETECTED Final   Chlamydophila pneumoniae NOT DETECTED NOT DETECTED Final   Mycoplasma pneumoniae NOT DETECTED NOT DETECTED Final  Urine culture     Status: Abnormal   Collection Time: 08/08/16  7:16 AM  Result Value Ref Range Status   Specimen Description URINE, RANDOM  Final   Special Requests NONE  Final   Culture MULTIPLE SPECIES PRESENT, SUGGEST RECOLLECTION (A)  Final   Report Status 08/09/2016 FINAL  Final  Surgical pcr screen     Status: None   Collection Time: 08/09/16  2:22 PM  Result Value Ref Range Status   MRSA, PCR NEGATIVE NEGATIVE Final   Staphylococcus aureus NEGATIVE NEGATIVE Final    Comment:        The Xpert SA Assay (FDA approved for NASAL specimens in patients over 53 years of age), is one component of a comprehensive surveillance program.  Test performance has been validated by [facility] for patients greater than or equal to 70 year old. It is not intended to diagnose infection nor to guide or monitor treatment.          Radiology Studies: No results found.      Scheduled Meds: . acetaminophen   1,000 mg Oral Once  . aspirin EC  81 mg Oral Daily  .  ceFAZolin (ANCEF) IV  2 g Intravenous On Call to OR  . enoxaparin (LOVENOX) injection  30 mg Subcutaneous Q24H  . famotidine  20 mg Oral BID  . ferrous sulfate  325 mg Oral Q breakfast  . folic acid  1 mg Oral Daily  . LORazepam  0-4 mg Intravenous Q12H  . multivitamin with minerals  1 tablet Oral Daily  . polyethylene glycol  17 g Oral BID  . thiamine  100 mg Oral Daily   Continuous Infusions: . sodium chloride 75 mL/hr at 08/09/16 1428  . lactated ringers 10 mL/hr at 08/09/16 2111     LOS: 2 days    Time spent: 35 minutes.     Elmarie Shiley, MD [facility] Pager [phone]  If 7PM-7AM, please contact night-coverage [email or web address] Password [email or web address] 08/10/2016, 10:12 AM

## 2016-08-10 NOTE — Interval H&P Note (Signed)
History and Physical Interval Note:  08/10/2016 11:29 AM  Jaime Phillips  has presented today for surgery, with the diagnosis of proximal humerus fracture right  The various methods of treatment have been discussed with the patient and family. After consideration of risks, benefits and other options for treatment, the patient has consented to  Procedure(s): OPEN REDUCTION INTERNAL FIXATION (ORIF) PROXIMAL HUMERUS FRACTURE (Right) as a surgical intervention .  The patient's history has been reviewed, patient examined, no change in status, stable for surgery.  I have reviewed the patient's chart and labs.  Questions were answered to the patient's satisfaction.     Jaime Phillips D

## 2016-08-10 NOTE — Anesthesia Postprocedure Evaluation (Addendum)
Anesthesia Post Note  Patient: Jaime Phillips  Procedure(s) Performed: Procedure(s) (LRB): OPEN REDUCTION INTERNAL FIXATION (ORIF) RIGHT PROXIMAL HUMERUS FRACTURE (Right)  Patient location during evaluation: PACU Anesthesia Type: Regional Level of consciousness: awake and alert, patient cooperative and oriented Pain management: pain level controlled Vital Signs Assessment: post-procedure vital signs reviewed and stable Respiratory status: spontaneous breathing, nonlabored ventilation, respiratory function stable and patient connected to nasal cannula oxygen Cardiovascular status: blood pressure returned to baseline and stable Postop Assessment: no signs of nausea or vomiting Anesthetic complications: no       Last Vitals:  Vitals:   08/10/16 1647 08/10/16 1702  BP: 126/79 (!) 130/92  Pulse:    Resp: (!) 9 20  Temp:      Last Pain:  Vitals:   08/10/16 1640  TempSrc:   PainSc: 0-No pain                 Castin Donaghue,E. Terrin Meddaugh

## 2016-08-10 NOTE — Progress Notes (Signed)
Paged ortho tech about the shoulder immobilizer.

## 2016-08-10 NOTE — Progress Notes (Signed)
Report given to Dave, CRNA 

## 2016-08-10 NOTE — Anesthesia Preprocedure Evaluation (Addendum)
Anesthesia Evaluation  Patient identified by MRN, date of birth, ID band Patient awake    Reviewed: Allergy & Precautions, NPO status , Patient's Chart, lab work & pertinent test results  History of Anesthesia Complications Negative for: history of anesthetic complications  Airway Mallampati: I  TM Distance: >3 FB Neck ROM: Full    Dental  (+) Dental Advisory Given, Chipped   Pulmonary Recent URI , Residual Cough, Current Smoker,    breath sounds clear to auscultation       Cardiovascular (-) hypertension(-) anginanegative cardio ROS   Rhythm:Regular Rate:Normal  08/09/16 ECHO:  EF 60-65%   Neuro/Psych  Headaches, Anxiety Depression    GI/Hepatic hiatal hernia, GERD  Controlled and Medicated,(+) Cirrhosis   Esophageal Varices  substance abuse  alcohol use, Elevated LFTs   Endo/Other  negative endocrine ROS  Renal/GU negative Renal ROS     Musculoskeletal   Abdominal   Peds  Hematology  (+) Blood dyscrasia (Hb 8.7, plt 109K), anemia ,   Anesthesia Other Findings   Reproductive/Obstetrics                            Anesthesia Physical Anesthesia Plan  ASA: III  Anesthesia Plan: General   Post-op Pain Management: GA combined w/ Regional for post-op pain   Induction: Intravenous  Airway Management Planned: Oral ETT  Additional Equipment:   Intra-op Plan:   Post-operative Plan: Extubation in OR  Informed Consent: I have reviewed the patients History and Physical, chart, labs and discussed the procedure including the risks, benefits and alternatives for the proposed anesthesia with the patient or authorized representative who has indicated his/her understanding and acceptance.   Dental advisory given  Plan Discussed with: Surgeon and CRNA  Anesthesia Plan Comments: (Plan routine monitors, GETA with interscalene block for post op analgesia)        Anesthesia Quick  Evaluation

## 2016-08-10 NOTE — Op Note (Signed)
08/07/2016 - 08/10/2016  3:13 PM  PATIENT:  Jaime Phillips    PRE-OPERATIVE DIAGNOSIS:  Right proximal humerus fracture   POST-OPERATIVE DIAGNOSIS:  Same  PROCEDURE:  OPEN REDUCTION INTERNAL FIXATION (ORIF) RIGHT PROXIMAL HUMERUS FRACTURE  SURGEON:  Kaysee Hergert, Ernesta Amble, MD  PHYSICIAN ASSISTANT: Roxan Hockey, PA-C, he was present and scrubbed throughout the case, critical for completion in a timely fashion, and for retraction, instrumentation, and closure.   ANESTHESIA:   General  PREOPERATIVE INDICATIONS:  Jaime Phillips is a  56 y.o. female with a diagnosis of Right proximal humerus fracture  who elected for surgical management.    The risks benefits and alternatives were discussed with the patient including but not limited to the risks of nonoperative treatment, versus surgical intervention including infection, bleeding, nerve injury, malunion, nonunion, the need for revision surgery, hardware prominence, hardware failure, the need for hardware removal, blood clots, cardiopulmonary complications, conversion to arthroplasty, morbidity, mortality, among others, and they were willing to proceed.  Predicted outcome is good, although there will be at least a six to nine month expected recovery.    OPERATIVE IMPLANTS: Biomet S3 locking plate  OPERATIVE FINDINGS: Displaced proximal humerus fracture  OPERATIVE PROCEDURE: The patient was brought to the operating room and placed in the supine position. General anesthesia was administered. IV antibiotics were given. She was placed in the beach chair position. All bony prominences were padded. The upper extremity was prepped and draped in usual sterile fashion. Deltopectoral incision was performed.  I exposed the fracture site, and placed deep retractors. I did not tenotomize the biceps tendon. This was left in place. I elevated a small portion of the deltoid off of the shaft, in order to gain access for the plate. I placed supraspinatus and  subscapularis stitches, and then reduced the head onto the shaft. This was maintained in satisfactory position.  I applied the plate and secured it into the sliding hole first. I confirmed position of the reduction and the plate with C-arm, and I placed a total of 2 guidewires into the appropriate position in the head. I was satisfied that the plate was distal appropriately, and then secured the plate proximally with smooth pegs, taking care to prevent penetration into the arch articular surface, using C-arm, as well as manual feel using a hand drill.  I then secured the plate distally using another cortical screws. Once complete fixation and reduction of been achieved, took final C-arm pictures, and irrigated the wounds copiously, and repaired the deltopectoral interval with Vicryl followed by Vicryl for the subcutaneous tissue with Monocryl and Steri-Strips for the skin. Prior to that I repaired the cuff and bone fragments of the greater tuberosity to the plate.  She was placed in a sling. She had a preoperative regional block as well. She tolerated the procedure well with no complications.   POST OPERATIVE PLAN: Sling full time, DVT px: Ambulation and foot pumps

## 2016-08-10 NOTE — H&P (View-Only) (Signed)
   ORTHOPAEDIC CONSULTATION  REQUESTING PHYSICIAN: Belkys A Regalado, MD  Chief Complaint: Right shoulder injury  HPI: Jaime Phillips is a 56 y.o. female who complains of  a fall at home yesterday this is a mechanical fall. She reports that she has fallen a few times and uses a cane due to "leg length discrepancy" she has had both hips replaced no other complaints  Past Medical History:  Diagnosis Date  . Abnormal uterine bleeding   . Alcoholism (HCC)   . Arthritis   . Chronic pain syndrome   . Depression   . Diverticulosis   . Headache(784.0)   . Hiatal hernia   . Hyperplastic colon polyp   . Hypopotassemia   . Internal hemorrhoids   . LFT elevation   . Macrocytic anemia   . Mallory-Weiss tear   . Potassium (K) deficiency   . PUD (peptic ulcer disease)   . Rectal bleeding    minor    Past Surgical History:  Procedure Laterality Date  . APPENDECTOMY    . COLONOSCOPY    . ESOPHAGOGASTRODUODENOSCOPY N/A 02/06/2014   Procedure: ESOPHAGOGASTRODUODENOSCOPY (EGD);  Surgeon: Daniel P Jacobs, MD;  Location: [facility];  Service: Endoscopy;  Laterality: N/A;  . OOPHORECTOMY    . ORIF PERIPROSTHETIC FRACTURE Right 05/28/2013   Procedure: OPEN REDUCTION INTERNAL FIXATION (ORIF) PERIPROSTHETIC FRACTURE;  Surgeon: Matthew D Olin, MD;  Location: [facility];  Service: Orthopedics;  Laterality: Right;  . SHOULDER SURGERY Left   . TOTAL HIP ARTHROPLASTY Right 04/10/2013   Procedure: RIGHT TOTAL HIP ARTHROPLASTY ANTERIOR APPROACH;  Surgeon: Matthew D Olin, MD;  Location: [facility];  Service: Orthopedics;  Laterality: Right;  . TOTAL HIP ARTHROPLASTY     left hip   Social History   Social History  . Marital status: Single    Spouse name: N/A  . Number of children: 1  . Years of education: N/A   Occupational History  . disabled    Social History Main Topics  . Smoking status: Current Every Day Smoker    Packs/day: 0.50    Years: 25.00    Types: Cigarettes  . Smokeless tobacco:  Never Used     Comment: Tobacco info given to pt. 08/16/12  . Alcohol use 1.2 - 1.8 oz/week    2 - 3 Shots of liquor per week     Comment: pt quit drinking 2 months ago (today is 03/11/15)  . Drug use: No  . Sexual activity: Not Currently   Other Topics Concern  . None   Social History Narrative  . None   Family History  Problem Relation Age of Onset  . Breast cancer Sister   . Diabetes Mother   . Kidney disease Mother   . Heart disease Father   . Colon cancer Father     questionable   Allergies  Allergen Reactions  . Morphine And Related Nausea Only and Anxiety   Prior to Admission medications   Medication Sig Start Date End Date Taking? Authorizing Provider  aspirin 325 MG tablet Take 325 mg by mouth daily.   Yes Historical Provider, MD  ferrous sulfate 325 (65 FE) MG tablet Take 325 mg by mouth daily with breakfast.   Yes Historical Provider, MD  ibuprofen (ADVIL,MOTRIN) 800 MG tablet Take 1 tablet (800 mg total) by mouth every 8 (eight) hours as needed for headache, mild pain or moderate pain. 11/07/15  Yes Agnes I Nwoko, NP  Multiple Vitamin (MULTIVITAMIN WITH MINERALS) TABS tablet Take   1 tablet by mouth daily.   Yes Historical Provider, MD   Dg Chest 2 View  Result Date: 08/07/2016 CLINICAL DATA:  Fall yesterday, right shoulder pain since fall, chest pain today. Smoker. EXAM: CHEST  2 VIEW COMPARISON:  03/11/2015 FINDINGS: Normal mediastinum and cardiac silhouette. Normal pulmonary vasculature. No evidence of effusion, infiltrate, or pneumothorax. There is acute fracture through the proximal humerus at the surgical neck. Multiple comminuted fragments. The humeral head is displaced inferior consistent with hemarthrosis in the glenohumeral joint. No dislocation evident. IMPRESSION: Comminuted fracture of the proximal RIGHT humerus. No acute cardiopulmonary process. Electronically Signed   By: Stewart  Edmunds M.D.   On: 08/07/2016 20:09   Dg Shoulder Right  Result Date:  08/07/2016 CLINICAL DATA:  56-year-old who fell yesterday and injured the right shoulder. She presents with pain. Initial encounter. EXAM: RIGHT SHOULDER - 2+ VIEW COMPARISON:  None. FINDINGS: Comminuted multipart fracture involving the right humeral head and neck. The greater tuberosity is present as a free fragment. Large joint effusion/hemarthrosis likely accounts for slight inferior subluxation of the glenohumeral joint. Acromioclavicular joint intact. IMPRESSION: Comminuted multipart fracture involving the right humeral head and neck. Electronically Signed   By: Thomas  Lawrence M.D.   On: 08/07/2016 20:07   Ct Shoulder Right Wo Contrast  Result Date: 08/08/2016 CLINICAL DATA:  Status post fall 08/06/2016 with a fracture of the right humerus. Pain and limited range of motion. Initial encounter. EXAM: CT OF THE UPPER RIGHT EXTREMITY WITHOUT CONTRAST TECHNIQUE: Multidetector CT imaging of the upper right extremity was performed according to the standard protocol. COMPARISON:  Plain films the right shoulder 08/07/2016. FINDINGS: Bones/Joint/Cartilage The patient has a comminuted surgical neck fracture of the right humerus. The distal fragment demonstrates 1 shaft width medial displacement. The humeral head is rotated medially due retraction of the rotator cuff. The fracture shows slight anterior displacement. It extends into the greater tuberosity with mild comminution but no displacement. The lesser tuberosity is spared. The humeral head is located. The acromioclavicular joint is intact. Ligaments Suboptimally assessed by CT. Muscles and Tendons Rotator cuff appears intact. Soft tissues Hemorrhage is seen about the patient's fracture. Imaged lung parenchyma is clear. IMPRESSION: Comminuted and displaced surgical neck fracture of the right humerus involves the greater tuberosity. No displacement of greater tuberosity is present. The exam is otherwise negative. Electronically Signed   By: Thomas  Dalessio M.D.    On: 08/08/2016 09:17    Positive ROS: All other systems have been reviewed and were otherwise negative with the exception of those mentioned in the HPI and as above.  Labs cbc  Recent Labs  08/08/16 0725 08/09/16 0211  WBC 9.9 7.8  HGB 9.2* 8.4*  HCT 26.9* 24.8*  PLT 105* 95*    Labs inflam No results for input(s): CRP in the last 72 hours.  Invalid input(s): ESR  Labs coag No results for input(s): INR, PTT in the last 72 hours.  Invalid input(s): PT   Recent Labs  08/08/16 0647 08/09/16 0211  NA 135 135  K 3.7 2.9*  CL 99* 102  CO2 26 25  GLUCOSE 100* 136*  BUN 9 5*  CREATININE 0.61 0.58  CALCIUM 8.4* 8.3*    Physical Exam: Vitals:   08/08/16 2331 08/09/16 0500  BP: 108/63 118/65  Pulse: 82 73  Resp: 18 18  Temp: 99.1 F (37.3 C) 98.4 F (36.9 C)   General: Alert, no acute distress Cardiovascular: No pedal edema Respiratory: No cyanosis, no use of accessory   musculature GI: No organomegaly, abdomen is soft and non-tender Skin: No lesions in the area of chief complaint other than those listed below in MSK exam.  Neurologic: Sensation intact distally save for the below mentioned MSK exam Psychiatric: Patient is competent for consent with normal mood and affect Lymphatic: No axillary or cervical lymphadenopathy  MUSCULOSKELETAL:  Right upper extremity pain with range of motion at the shoulder compartments are soft neurovascularly intact Other extremities are atraumatic with painless ROM and NVI.  Assessment: Right proximal humerus fracture  Plan: Due to the displacement and her use of a cane here I would recommend ORIF of this fracture I have this scheduled for Tuesday I will continue to discuss it with her family the option up to her I did discuss the risks and benefits of operative versus nonoperative management with her  Please keep her nothing by mouth at midnight tonight   Zared Knoth D, MD Cell [phone]   08/09/2016 7:32  AM    

## 2016-08-10 NOTE — Anesthesia Procedure Notes (Addendum)
Anesthesia Regional Block:  Interscalene brachial plexus block  Pre-Anesthetic Checklist: ,, timeout performed, Correct Patient, Correct Site, Correct Laterality, Correct Procedure, Correct Position, site marked, Risks and benefits discussed,  Surgical consent,  Pre-op evaluation,  At surgeon's request and post-op pain management  Laterality: Upper and Right  Prep: chloraprep       Needles:  Injection technique: Single-shot  Needle Type: Echogenic Stimulator Needle          Additional Needles:  Procedures: ultrasound guided (picture in chart) Interscalene brachial plexus block Narrative:  Start time: 08/10/2016 1:01 PM End time: 08/10/2016 1:07 PM Injection made incrementally with aspirations every 5 mL.  Performed by: Personally  Anesthesiologist: Ramiah Helfrich  Additional Notes: H+P and labs reviewed, risks and benefits discussed with patient, procedure tolerated well without complications

## 2016-08-10 NOTE — Transfer of Care (Signed)
Immediate Anesthesia Transfer of Care Note  Patient: Jaime Phillips  Procedure(s) Performed: Procedure(s): OPEN REDUCTION INTERNAL FIXATION (ORIF) RIGHT PROXIMAL HUMERUS FRACTURE (Right)  Patient Location: PACU  Anesthesia Type:General and GA combined with regional for post-op pain  Level of Consciousness: awake, alert  and responds to stimulation  Airway & Oxygen Therapy: Patient Spontanous Breathing  Post-op Assessment: Report given to RN, Post -op Vital signs reviewed and stable and Patient moving all extremities  Post vital signs: Reviewed and stable  Last Vitals:  Vitals:   08/10/16 1310 08/10/16 1538  BP:    Pulse: 86   Resp: 17   Temp:  (P) 36.6 C    Last Pain:  Vitals:   08/10/16 1538  TempSrc:   PainSc: (P) 0-No pain      Patients Stated Pain Goal: 2 ([id number])  Complications: No apparent anesthesia complications

## 2016-08-10 NOTE — Anesthesia Procedure Notes (Signed)
Procedure Name: Intubation Date/Time: 08/10/2016 1:27 PM Performed by: Melina Copa, Tallia Moehring R Pre-anesthesia Checklist: Patient identified, Emergency Drugs available, Suction available and Patient being monitored Patient Re-evaluated:Patient Re-evaluated prior to inductionOxygen Delivery Method: Circle System Utilized Preoxygenation: Pre-oxygenation with 100% oxygen Intubation Type: IV induction Ventilation: Mask ventilation without difficulty Laryngoscope Size: Mac and 3 Grade View: Grade II Tube type: Oral Tube size: 7.5 mm Number of attempts: 1 Airway Equipment and Method: Stylet and Oral airway Placement Confirmation: ETT inserted through vocal cords under direct vision,  positive ETCO2 and breath sounds checked- equal and bilateral Secured at: 21 cm Tube secured with: Tape Dental Injury: Teeth and Oropharynx as per pre-operative assessment

## 2016-08-10 NOTE — Progress Notes (Signed)
Report called to [place], RN in short stay.

## 2016-08-11 ENCOUNTER — Encounter (HOSPITAL_COMMUNITY): Payer: Self-pay | Admitting: Orthopedic Surgery

## 2016-08-11 DIAGNOSIS — K7030 Alcoholic cirrhosis of liver without ascites: Secondary | ICD-10-CM

## 2016-08-11 DIAGNOSIS — R079 Chest pain, unspecified: Secondary | ICD-10-CM

## 2016-08-11 DIAGNOSIS — F1020 Alcohol dependence, uncomplicated: Secondary | ICD-10-CM

## 2016-08-11 LAB — CBC
HCT: 24.6 % — ABNORMAL LOW (ref 36.0–46.0)
HEMOGLOBIN: 8.5 g/dL — AB (ref 12.0–15.0)
MCH: 34.8 pg — ABNORMAL HIGH (ref 26.0–34.0)
MCHC: 34.6 g/dL (ref 30.0–36.0)
MCV: 100.8 fL — AB (ref 78.0–100.0)
PLATELETS: 158 10*3/uL (ref 150–400)
RBC: 2.44 MIL/uL — AB (ref 3.87–5.11)
RDW: 13.5 % (ref 11.5–15.5)
WBC: 7.5 10*3/uL (ref 4.0–10.5)

## 2016-08-11 LAB — BASIC METABOLIC PANEL
Anion gap: 9 (ref 5–15)
BUN: <5 mg/dL — ABNORMAL LOW (ref 6–20)
CHLORIDE: 99 mmol/L — AB (ref 101–111)
CO2: 26 mmol/L (ref 22–32)
Calcium: 9.3 mg/dL (ref 8.9–10.3)
Creatinine, Ser: 0.57 mg/dL (ref 0.44–1.00)
GFR calc Af Amer: >60 mL/min (ref >60)
GLUCOSE: 115 mg/dL — AB (ref 65–99)
POTASSIUM: 4 mmol/L (ref 3.5–5.1)
SODIUM: 134 mmol/L — AB (ref 135–145)

## 2016-08-11 NOTE — Progress Notes (Signed)
Occupational Therapy Treatment Patient Details Name: Jaime Phillips MRN: [id number] DOB: Mar 07, 1961 Today's Date: 08/11/2016    History of present illness [name] is a 56 y.o. female is s/p OPEN REDUCTION INTERNAL FIXATION (ORIF) RIGHT PROXIMAL HUMERUS FRACTURE. Pt has a past medical history that includes Alcoholism ([place]); Arthritis; Chronic pain syndrome; and Depression. 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   OT comments  Pt goals updated post-op and Pt doing well. Less impulsivity this session, and asking good questions about home safety. Pt currently mod assist for ADL and min A for 1 HHA ambulation (Pt requested). Pt shoulder d/c handout provided and reviewed in full with reference to ADL. Pt will continue to benefit from skilled OT in the acute care setting prior to dc to venue below. OT continues to believe that a PT order is appropriate and necessary for safety as Pt used a SPC with her RUE for ambulation outside the home PTA. Next OT session to focus on reinforcing dressing and ADL safety/compensatory strategies.  Follow Up Recommendations  Home health OT    Equipment Recommendations  None recommended by OT    Recommendations for Other Services PT consult    Precautions / Restrictions Precautions Precautions: Fall Precaution Comments: NWB R shoulder, sling at all times except for ADL Required Braces or Orthoses: Sling Restrictions Weight Bearing Restrictions: Yes RUE Weight Bearing: Non weight bearing       Mobility Bed Mobility Overal bed mobility: Independent             General bed mobility comments: Pt did not attempt to push or pull with RUE  Transfers Overall transfer level: Needs assistance Equipment used: None Transfers: Sit to/from Stand Sit to Stand: Min  assist         General transfer comment: 1 HHA, Pt pulls to rise from bed    Balance Overall balance assessment: Needs assistance;History of Falls Sitting-balance support: Single extremity supported;Feet supported Sitting balance-Leahy Scale: Good Sitting balance - Comments: sitting EOB with no back support   Standing balance support: Single extremity supported;During functional activity Standing balance-Leahy Scale: Fair Standing balance comment: Pt requesting 1 HHA for balance                   ADL Overall ADL's : Needs assistance/impaired Eating/Feeding: Set up;Sitting   Grooming: Wash/dry face;Wash/dry hands;Set up;Sitting Grooming Details (indicate cue type and reason): Pt contines to use RUE during ADL and requires vc for NWB status.                 Toilet Transfer: Minimal assistance;Ambulation;Comfort height toilet Toilet Transfer Details (indicate cue type and reason): 1 HHA for balance Toileting- Clothing Manipulation and Hygiene: Minimal assistance;Sit to/from stand   Tub/ Shower Transfer: Tub transfer;Minimal assistance;Ambulation Tub/Shower Transfer Details (indicate cue type and reason): demonstrated by OT to go in sideways using wall for stability Functional mobility during ADLs: Minimal assistance General ADL Comments: Pt reports multiple falls, requires 1 HHA for safety, so again requesting PT consult      Vision                     Perception     Praxis      Cognition   Behavior During Therapy: Impulsive Overall Cognitive Status: No family/caregiver present to determine baseline cognitive functioning  General Comments: Pt states that she does not have anyone at home to help her, that her son and daughter-in-law live with her, but have 3 small children (4 and under)    Extremity/Trunk Assessment               Exercises Donning/doffing shirt without moving shoulder: Moderate assistance;Patient able  to independently direct caregiver Method for sponge bathing under operated UE: Modified independent Donning/doffing sling/immobilizer: Minimal assistance;Patient able to independently direct caregiver Correct positioning of sling/immobilizer: Minimal assistance;Patient able to independently direct caregiver Pendulum exercises (written home exercise program):  (n/a) ROM for elbow, wrist and digits of operated UE:  (n/a Pt encouraged movement with fingers) Sling wearing schedule (on at all times/off for ADL's): Supervision/safety Proper positioning of operated UE when showering: Modified independent Dressing change:  (n/a) Positioning of UE while sleeping: Minimal assistance;Patient able to independently direct caregiver   Shoulder Instructions Shoulder Instructions Donning/doffing shirt without moving shoulder: Moderate assistance;Patient able to independently direct caregiver Method for sponge bathing under operated UE: Modified independent Donning/doffing sling/immobilizer: Minimal assistance;Patient able to independently direct caregiver Correct positioning of sling/immobilizer: Minimal assistance;Patient able to independently direct caregiver Pendulum exercises (written home exercise program):  (n/a) ROM for elbow, wrist and digits of operated UE:  (n/a Pt encouraged movement with fingers) Sling wearing schedule (on at all times/off for ADL's): Supervision/safety Proper positioning of operated UE when showering: Modified independent Dressing change:  (n/a) Positioning of UE while sleeping: Minimal assistance;Patient able to independently direct caregiver     General Comments      Pertinent Vitals/ Pain       Pain Assessment: 0-10 Pain Score: 9  Faces Pain Scale: Hurts even more Pain Location: R shoulder- supine and sitting does not demonstrate any discomfort Pain Descriptors / Indicators: Throbbing Pain Intervention(s): Limited activity within patient's tolerance;Monitored during  session;Repositioned  Home Living                                          Prior Functioning/Environment              Frequency  Min 2X/week        Progress Toward Goals  OT Goals(current goals can now be found in the care plan section)  Progress towards OT goals: Progressing toward goals;Goals met and updated - see care plan  Acute Rehab OT Goals Patient Stated Goal: to be safe when she goes home OT Goal Formulation: With patient Time For Goal Achievement: 08/23/16 Potential to Achieve Goals: Good ADL Goals Pt Will Perform Upper Body Bathing: with modified independence;standing (with OT strategy to clean under arm pit) Pt Will Perform Upper Body Dressing: with supervision;sitting (dressing operated arm first) Pt Will Perform Lower Body Dressing: with supervision;sit to/from stand Additional ADL Goal #1: Pt will don/doff sling mod I, and recall sling wearing schedule  Plan Discharge plan remains appropriate;Other (comment) (Pt goals updated post-surgery)    Co-evaluation                 End of Session Equipment Utilized During Treatment: Gait belt;Other (comment) (sling)   Activity Tolerance Patient tolerated treatment well   Patient Left in chair;with call bell/phone within reach;with chair alarm set   Nurse Communication Mobility status;Precautions        Time: 1200-1230 OT Time Calculation (min): 30 min  Charges: OT General Charges $OT Visit: 1 Procedure OT Treatments $  Self Care/Home Management : 23-37 mins  Jaci Carrel 08/11/2016, 2:03 PM  Hulda Humphrey OTR/L [phone]

## 2016-08-11 NOTE — Progress Notes (Signed)
PROGRESS NOTE    Jaime Phillips  [id number] DOB: 11-19-1960 DOA: 08/07/2016 PCP: [facility]   Brief Narrative:  Jaime Phillips is a 56 y.o. female with medical history significant for alcohol abuse, depression, iron deficiency anemia, and GERD who presents to the emergency department with fevers, malaise, cough, intermittent chest pain, and right shoulder pain after a fall at home yesterday. Patient reports that she developed fevers, malaise, and productive cough approximately 3 days ago after being with an ill grandchild a few days prior. She suffered a ground-level mechanical fall yesterday at home which she attributes to her leg length discrepancy. She fell onto an outstretched right arm and has been experiencing right shoulder pain ever since. She continued to drink alcohol less amount.   Assessment & Plan:   1-Chest Pain;  -resolved -due to cough/pleurisy in setting of URI  -Troponin 0.03, no evidence of ACS -ECHO Normal EF and wall motion  2-Right humeral head fracture;  -CT :Comminuted and displaced surgical neck fracture of the right humerus involves the greater tuberosity -Ortho Dr.Murphy consulted, s/p ORIF last pm -PT eval  3-URI; fever; -resolved -Respiratory panel negative.   4-Alcohol Abuse, cirrhosis;  -no withdrawal noted, monitor -counseled -Mildly elevated AST, consistent with alcohol intake. Mildly elevated bili. Monitor for now.   5-Hyponatremia;  Improved with fluids.   Prolong QT;  Mg replaced.   Anemia;  Has been attributed to iron-deficiency, will continue oral supplementation   Hypokalemia;  Resolved.   DVT prophylaxis: Lovenox Code Status: Full code.  Family Communication: care discussed with patient.  Disposition Plan:  Home tomorrow  Consultants:   Ortho    Procedures:  1/9 OPEN REDUCTION INTERNAL FIXATION (ORIF) RIGHT PROXIMAL HUMERUS FRACTURE   Antimicrobials:  none   Subjective: Some  shoulder soreness ok otherwise  Objective: Vitals:   08/10/16 2100 08/11/16 0000 08/11/16 0550 08/11/16 0600  BP: 136/82 130/71 111/62 116/62  Pulse: 79 77 81 81  Resp: 16  16   Temp: 99.6 F (37.6 C)  98.7 F (37.1 C)   TempSrc: Oral  Oral   SpO2: 100%  94%   Weight:      Height:        Intake/Output Summary (Last 24 hours) at 08/11/16 1144 Last data filed at 08/11/16 1100  Gross per 24 hour  Intake             2100 ml  Output              150 ml  Net             1950 ml   Filed Weights   08/08/16 0252 08/09/16 0456 08/10/16 0436  Weight: 52.9 kg (116 lb 11.2 oz) 54.3 kg (119 lb 11.2 oz) 53.5 kg (117 lb 14.4 oz)    Examination:  General exam: Appears calm and comfortable, AAOx3, no distress Respiratory system: Clear to auscultation. Respiratory effort normal. Cardiovascular system: S1 & S2 heard, RRR.  Gastrointestinal system: Abdomen is nondistended, soft and nontender.Normal bowel sounds heard. Central nervous system: Alert and oriented. No focal neurological deficits. Extremities: Symmetric 4 x 5 power. Right arm in sling. No edema Skin: No rashes, lesions or ulcers Psychiatry: Judgement and insight appear normal. Mood & affect appropriate.     Data Reviewed: I have personally reviewed following labs and imaging studies  CBC:  Recent Labs Lab 08/07/16 1934 08/08/16 0725 08/09/16 0211 08/10/16 0316 08/11/16 0456  WBC 13.1* 9.9 7.8 7.2 7.5  HGB  11.8* 9.2* 8.4* 8.7* 8.5*  HCT 33.3* 26.9* 24.8* 25.7* 24.6*  MCV 97.1 99.6 100.0 101.6* 100.8*  PLT 165 105* 95* 109* 0000000   Basic Metabolic Panel:  Recent Labs Lab 08/07/16 1934 08/08/16 0647 08/08/16 0725 08/09/16 0211 08/10/16 0316 08/11/16 0456  NA 133* 135  --  135 136 134*  K 3.7 3.7  --  2.9* 4.6 4.0  CL 93* 99*  --  102 104 99*  CO2 24 26  --  25 27 26   GLUCOSE 125* 100*  --  136* 95 115*  BUN 11 9  --  5* <5* <5*  CREATININE 0.72 0.61  --  0.58 0.53 0.57  CALCIUM 9.3 8.4*  --  8.3* 9.4 9.3    MG  --   --  1.4* 2.0  --   --    GFR: Estimated Creatinine Clearance: 65 mL/min (by C-G formula based on SCr of 0.57 mg/dL). Liver Function Tests:  Recent Labs Lab 08/07/16 2011  AST 71*  ALT 28  ALKPHOS 88  BILITOT 1.8*  PROT 7.7  ALBUMIN 4.6   No results for input(s): LIPASE, AMYLASE in the last 168 hours. No results for input(s): AMMONIA in the last 168 hours. Coagulation Profile:  Recent Labs Lab 08/10/16 1025  INR 0.97   Cardiac Enzymes:  Recent Labs Lab 08/08/16 0114 08/08/16 0647 08/08/16 1121  TROPONINI 0.03* 0.08* 0.03*   BNP (last 3 results) No results for input(s): PROBNP in the last 8760 hours. HbA1C: No results for input(s): HGBA1C in the last 72 hours. CBG: No results for input(s): GLUCAP in the last 168 hours. Lipid Profile: No results for input(s): CHOL, HDL, LDLCALC, TRIG, CHOLHDL, LDLDIRECT in the last 72 hours. Thyroid Function Tests: No results for input(s): TSH, T4TOTAL, FREET4, T3FREE, THYROIDAB in the last 72 hours. Anemia Panel: No results for input(s): VITAMINB12, FOLATE, FERRITIN, TIBC, IRON, RETICCTPCT in the last 72 hours. Sepsis Labs: No results for input(s): PROCALCITON, LATICACIDVEN in the last 168 hours.  Recent Results (from the past 240 hour(s))  Respiratory Panel by PCR     Status: None   Collection Time: 08/08/16  1:59 AM  Result Value Ref Range Status   Adenovirus NOT DETECTED NOT DETECTED Final   Coronavirus 229E NOT DETECTED NOT DETECTED Final   Coronavirus HKU1 NOT DETECTED NOT DETECTED Final   Coronavirus NL63 NOT DETECTED NOT DETECTED Final   Coronavirus OC43 NOT DETECTED NOT DETECTED Final   Metapneumovirus NOT DETECTED NOT DETECTED Final   Rhinovirus / Enterovirus NOT DETECTED NOT DETECTED Final   Influenza A NOT DETECTED NOT DETECTED Final   Influenza B NOT DETECTED NOT DETECTED Final   Parainfluenza Virus 1 NOT DETECTED NOT DETECTED Final   Parainfluenza Virus 2 NOT DETECTED NOT DETECTED Final    Parainfluenza Virus 3 NOT DETECTED NOT DETECTED Final   Parainfluenza Virus 4 NOT DETECTED NOT DETECTED Final   Respiratory Syncytial Virus NOT DETECTED NOT DETECTED Final   Bordetella pertussis NOT DETECTED NOT DETECTED Final   Chlamydophila pneumoniae NOT DETECTED NOT DETECTED Final   Mycoplasma pneumoniae NOT DETECTED NOT DETECTED Final  Urine culture     Status: Abnormal   Collection Time: 08/08/16  7:16 AM  Result Value Ref Range Status   Specimen Description URINE, RANDOM  Final   Special Requests NONE  Final   Culture MULTIPLE SPECIES PRESENT, SUGGEST RECOLLECTION (A)  Final   Report Status 08/09/2016 FINAL  Final  Surgical pcr screen  Status: None   Collection Time: 08/09/16  2:22 PM  Result Value Ref Range Status   MRSA, PCR NEGATIVE NEGATIVE Final   Staphylococcus aureus NEGATIVE NEGATIVE Final    Comment:        The Xpert SA Assay (FDA approved for NASAL specimens in patients over 74 years of age), is one component of a comprehensive surveillance program.  Test performance has been validated by [facility] for patients greater than or equal to 51 year old. It is not intended to diagnose infection nor to guide or monitor treatment.          Radiology Studies: Dg Shoulder Right  Result Date: 08/10/2016 CLINICAL DATA:  ORIF. EXAM: RIGHT SHOULDER - 2+ VIEW; DG C-ARM 61-120 MIN COMPARISON:  08/07/2016.  CT 08/07/2016. FINDINGS: ORIF proximal right humerus. Hardware intact. Comminuted fracture fragments are noted. Fractures slightly displaced 2 images obtained. 0 minutes 23 seconds fluoroscopy time. IMPRESSION: ORIF proximal right humerus. Electronically Signed   By: Marcello Moores  Register   On: 08/10/2016 15:05   Dg Shoulder Right Port  Result Date: 08/10/2016 CLINICAL DATA:  Postop films for ORIF of humerus fracture EXAM: PORTABLE RIGHT SHOULDER COMPARISON:  08/07/2016 FINDINGS: Patient is status post ORIF of proximal right humerus fracture with placement of surgical  plate and multiple screws. This bridges a comminuted fracture at the junction of the head and neck. Residual 14 mm bone fragment along the inner cortex of the proximal humerus. Alignment improved. No dislocation. Small amount of soft tissue gas is felt postoperative. Right lung apex is clear. Inferior positioning of right humeral head may relate to effusion IMPRESSION: 1. Interim surgical fixation of right proximal humerus fracture with improved alignment. 2. Slight inferior positioning of the humeral head could relate to hemarthrosis/effusion Electronically Signed   By: Donavan Foil M.D.   On: 08/10/2016 16:39   Dg C-arm 1-60 Min  Result Date: 08/10/2016 CLINICAL DATA:  ORIF. EXAM: RIGHT SHOULDER - 2+ VIEW; DG C-ARM 61-120 MIN COMPARISON:  08/07/2016.  CT 08/07/2016. FINDINGS: ORIF proximal right humerus. Hardware intact. Comminuted fracture fragments are noted. Fractures slightly displaced 2 images obtained. 0 minutes 23 seconds fluoroscopy time. IMPRESSION: ORIF proximal right humerus. Electronically Signed   By: Marcello Moores  Register   On: 08/10/2016 15:05        Scheduled Meds: . aspirin EC  81 mg Oral Daily  . celecoxib  200 mg Oral Q12H  . docusate sodium  100 mg Oral BID  . enoxaparin (LOVENOX) injection  30 mg Subcutaneous Q24H  . famotidine  20 mg Oral BID  . ferrous sulfate  325 mg Oral Q breakfast  . folic acid  1 mg Oral Daily  . ketorolac  15 mg Intravenous Q6H  . LORazepam  0-4 mg Intravenous Q12H  . multivitamin with minerals  1 tablet Oral Daily  . polyethylene glycol  17 g Oral BID  . senna  1 tablet Oral BID  . thiamine  100 mg Oral Daily   Continuous Infusions: . sodium chloride 75 mL/hr at 08/09/16 1428  . lactated ringers       LOS: 3 days    Time spent: 35 minutes.     Domenic Polite, MD [facility] Pager [phone] If 7PM-7AM, please contact night-coverage [email or web address] Password [email or web address] 08/11/2016, 11:44 AM

## 2016-08-11 NOTE — Progress Notes (Addendum)
   Assessment: 1 Day Post-Op  S/P Procedure(s) (LRB): OPEN REDUCTION INTERNAL FIXATION (ORIF) RIGHT PROXIMAL HUMERUS FRACTURE (Right) by Dr. Ernesta Amble. Percell Miller on 08/10/16  Principal Problem:   Chest pain Active Problems:   Normocytic anemia   Gastro-esophageal reflux   Cirrhosis (HCC)   Alcohol use disorder, severe, dependence (HCC)   Humeral head fracture, right, closed, initial encounter   Hyponatremia   Acute URI ABLA: Hgb slightly down after surgery.  Asymptomatic.  Stable from an orthopedic perspective.  Plan: Up with therapy  Weight Bearing: Non Weight Bearing (NWB) right arm Dressings: Reinforce prn.  VTE prophylaxis: Mobilize, SCDs.  Lovenox while inpatient. Dispo: Home likely today or tomorrow.  Follow up with Dr. Alain Marion in the office in 2 weeks.  Please call with questions.  Subjective: Patient reports pain as moderate. Pain controlled with IV and PO meds.  Tolerating diet.  Urinating.   No CP, SOB.   OOB in room.  Objective:   VITALS:   Vitals:   08/10/16 2100 08/11/16 0000 08/11/16 0550 08/11/16 0600  BP: 136/82 130/71 111/62 116/62  Pulse: 79 77 81 81  Resp: 16  16   Temp: 99.6 F (37.6 C)  98.7 F (37.1 C)   TempSrc: Oral  Oral   SpO2: 100%  94%   Weight:      Height:       CBC Latest Ref Rng & Units 08/11/2016 08/10/2016 08/09/2016  WBC 4.0 - 10.5 K/uL 7.5 7.2 7.8  Hemoglobin 12.0 - 15.0 g/dL 8.5(L) 8.7(L) 8.4(L)  Hematocrit 36.0 - 46.0 % 24.6(L) 25.7(L) 24.8(L)  Platelets 150 - 400 K/uL 158 109(L) 95(L)   BMP Latest Ref Rng & Units 08/11/2016 08/10/2016 08/09/2016  Glucose 65 - 99 mg/dL 115(H) 95 136(H)  BUN 6 - 20 mg/dL <5(L) <5(L) 5(L)  Creatinine 0.44 - 1.00 mg/dL 0.57 0.53 0.58  Sodium 135 - 145 mmol/L 134(L) 136 135  Potassium 3.5 - 5.1 mmol/L 4.0 4.6 2.9(L)  Chloride 101 - 111 mmol/L 99(L) 104 102  CO2 22 - 32 mmol/L 26 27 25   Calcium 8.9 - 10.3 mg/dL 9.3 9.4 8.3(L)   Intake/Output      01/09 0701 - 01/10 0700 01/10 0701 - 01/11 0700   P.O. 360    I.V. (mL/kg) 1500 (28)    Total Intake(mL/kg) 1860 (34.8)    Urine (mL/kg/hr) 200 (0.2)    Stool     Blood 150 (0.1)    Total Output 350     Net +1510          Urine Occurrence 1 x      Physical Exam: General: NAD.  Sleeping comfortably on arrival.  Sling in place. MSK Right Arm:  Hand warm.  Moves arm about in small arc motion w/o apparent discomfort. Neurovascularly intact Sensation intact distally Incision: dressing C/D/I  Prudencio Burly III 08/11/2016, 7:41 AM

## 2016-08-12 ENCOUNTER — Encounter (HOSPITAL_COMMUNITY): Payer: Self-pay

## 2016-08-12 DIAGNOSIS — S42201A Unspecified fracture of upper end of right humerus, initial encounter for closed fracture: Secondary | ICD-10-CM

## 2016-08-12 MED ORDER — POLYETHYLENE GLYCOL 3350 17 G PO PACK: 17.0000 g | PACK | Freq: Every day | ORAL | 0 refills | Status: DC | PRN

## 2016-08-12 NOTE — Progress Notes (Signed)
Occupational Therapy Treatment Patient Details Name: Jaime Phillips MRN: [id number] DOB: 08-02-1961 Today's Date: 08/12/2016    History of present illness Jaime Phillips is a 56 y.o. female is s/p OPEN REDUCTION INTERNAL FIXATION (ORIF) RIGHT PROXIMAL HUMERUS FRACTURE. Pt has a past medical history that includes Alcoholism ([place]); Arthritis; Chronic pain syndrome; and Depression. 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   OT comments  Pt presented pleasant and willing to work with therapy and making progress in understanding. Reviewed sling precautions and safety/compensatory strategies in full and Pt verbalized understanding. Handout provided. OT especially stressed compliance with NWB and inactivity for RUE so it has time to heal, and importance of wearing sling as physical reminder. Pt verbalized understanding and receptive to education.   Follow Up Recommendations  No OT follow up;Supervision - Intermittent (progress rehab as ordered by MD at follow up)    Equipment Recommendations  3 in 1 bedside commode    Recommendations for Other Services PT consult    Precautions / Restrictions Precautions Precautions: Fall Precaution Comments: NWB R shoulder, sling at all times except for ADL Required Braces or Orthoses: Sling Restrictions Weight Bearing Restrictions: Yes RUE Weight Bearing: Non weight bearing       Mobility Bed Mobility Overal bed mobility: Independent             General bed mobility comments: Pt did not attempt to push or pull with RUE, able to manage blankets and states that she doesn't know if she will be staying on the couch or her own bed at home  Transfers Overall transfer level: Needs assistance Equipment used: None (sling) Transfers: Sit to/from Stand Sit to Stand:  Min assist         General transfer comment: 1 HHA, Pt pulls to rise from bed    Balance Overall balance assessment: Needs assistance;History of Falls Sitting-balance support: No upper extremity supported;Feet supported Sitting balance-Leahy Scale: Good Sitting balance - Comments: sitting EOB with no back support   Standing balance support: Single extremity supported;During functional activity Standing balance-Leahy Scale: Fair Standing balance comment: during ambulation, requesting 1HHA still                   ADL Overall ADL's : Needs assistance/impaired Eating/Feeding: Set up;Sitting   Grooming: Wash/dry face;Wash/dry hands;Set up;Sitting Grooming Details (indicate cue type and reason): no attempt to use RUE during ADL this session (verbal cues before starting) Upper Body Bathing: Cueing for UE precautions;Moderate assistance;Sitting   Lower Body Bathing: Min guard;Sit to/from stand   Upper Body Dressing : Maximal assistance;Cueing for UE precautions;Sitting Upper Body Dressing Details (indicate cue type and reason): Pt able to peform teach back for sequencing of UB dressing Lower Body Dressing: Moderate assistance;Sit to/from stand Lower Body Dressing Details (indicate cue type and reason): requires (A) to thread mesh underwear but able to pull them up with L UE Toilet Transfer: Minimal assistance (HHA)           Functional mobility during ADLs: Minimal assistance (HHA, she used SPC with RUE PTA) General ADL Comments: Pt reports multiple falls, requires 1 HHA for safety, so again requesting PT consult      Vision                     Perception     Praxis      Cognition  Behavior During Therapy: WFL for tasks assessed/performed Overall Cognitive Status: Within Functional Limits for tasks assessed                  General Comments: Pt able to answer "shoulder protocol" questions when answered. ex: when should you wear your sling, and do  you sleep in your sling etc    Extremity/Trunk Assessment               Exercises Shoulder Exercises Digit Composite Flexion: AROM;Right Composite Extension: AROM;Right Donning/doffing shirt without moving shoulder: Maximal assistance;Patient able to independently direct caregiver Method for sponge bathing under operated UE: Modified independent Donning/doffing sling/immobilizer: Minimal assistance;Patient able to independently direct caregiver Correct positioning of sling/immobilizer: Patient able to independently direct caregiver;Supervision/safety ROM for elbow, wrist and digits of operated UE: Supervision/safety (ROM at hand only) Sling wearing schedule (on at all times/off for ADL's): Supervision/safety Proper positioning of operated UE when showering: Modified independent Positioning of UE while sleeping: Minimal assistance;Patient able to independently direct caregiver   Shoulder Instructions Shoulder Instructions Donning/doffing shirt without moving shoulder: Maximal assistance;Patient able to independently direct caregiver Method for sponge bathing under operated UE: Modified independent Donning/doffing sling/immobilizer: Minimal assistance;Patient able to independently direct caregiver Correct positioning of sling/immobilizer: Patient able to independently direct caregiver;Supervision/safety ROM for elbow, wrist and digits of operated UE: Supervision/safety (ROM at hand only) Sling wearing schedule (on at all times/off for ADL's): Supervision/safety Proper positioning of operated UE when showering: Modified independent Positioning of UE while sleeping: Minimal assistance;Patient able to independently direct caregiver     General Comments      Pertinent Vitals/ Pain       Pain Assessment: 0-10 Pain Score: 8  Faces Pain Scale: Hurts a little bit Pain Location: R shoulder, also complains of itching Pain Descriptors / Indicators: Sore;Tightness Pain Intervention(s):  Limited activity within patient's tolerance;Monitored during session;Repositioned (says that ice increases throbbing)  Home Living                                          Prior Functioning/Environment              Frequency  Min 2X/week        Progress Toward Goals  OT Goals(current goals can now be found in the care plan section)  Progress towards OT goals: Progressing toward goals  Acute Rehab OT Goals Patient Stated Goal: to be safe when she goes home OT Goal Formulation: With patient Time For Goal Achievement: 08/23/16 Potential to Achieve Goals: Good  Plan Discharge plan needs to be updated;Equipment recommendations need to be updated    Co-evaluation                 End of Session Equipment Utilized During Treatment: Gait belt;Other (comment) (sling)   Activity Tolerance Patient tolerated treatment well   Patient Left in bed;with call bell/phone within reach;with bed alarm set   Nurse Communication Mobility status;Precautions        Time: [id number] OT Time Calculation (min): 24 min  Charges: OT General Charges $OT Visit: 1 Procedure OT Treatments $Self Care/Home Management : 23-37 mins  Jaci Carrel 08/12/2016, 1:37 PM Hulda Humphrey OTR/L [phone]

## 2016-08-12 NOTE — Care Management (Signed)
Case manager spoke with patient concerning her request for assistance with medication. CM explained to patient that she recieves medicaid and her copay is $3.00, there is no further medication  assistance available.

## 2016-08-12 NOTE — Progress Notes (Signed)
Pt ready for discharge to home with family in attendance. All personal belongings with pt. Discharge instructions and rx reviewed with pt. No c/o or distress noted.

## 2016-08-12 NOTE — Discharge Summary (Signed)
Physician Discharge Summary  Jaime Phillips [id number] DOB: 01-11-1961 DOA: 08/07/2016  PCP: [place] date: 08/07/2016 Discharge date: 08/12/2016  Time spent: 45 minutes  Recommendations for Outpatient Follow-up:  1. PCP in 1 week 2. Ortho Dr.Murphy in 1-2weeks   Discharge Diagnoses:    Humeral head fracture, right, closed, initial encounter   Chest pain   Normocytic anemia   Gastro-esophageal reflux   Alcoholic Cirrhosis (HCC)   Alcohol use disorder, severe, dependence ([place])   Hyponatremia   Acute URI   Discharge Condition: stable  Diet recommendation: low sodium  Filed Weights   08/09/16 0456 08/10/16 0436 08/11/16 1716  Weight: 54.3 kg (119 lb 11.2 oz) 53.5 kg (117 lb 14.4 oz) 67.1 kg (148 lb)    History of present illness:  Jaime Phillips a 56 y.o.femalewith medical history significant for alcohol abuse, depression, iron deficiency anemia, and GERD who presents to the emergency department with fevers, malaise, cough, intermittent chest pain, and right shoulder pain after a fall at home yesterday.  Hospital Course:   1-Right humeral head fracture;  -following mechanical fall -CT  showed:Comminuted and displaced surgical neck fracture of the right humerus involves the greater tuberosity -Ortho Dr.Murphy consulted, s/p ORIF on 1/9 -stable and discharged home with [facility] services and advised FU with Ortho Dr.Murphy  2-URI; fever; -resolved -Respiratory panel negative.   3-Chest Pain;  -resolved -due to cough/pleurisy in setting of URI, no hypoxia or tachycardia -Troponin 0.03, no evidence of ACS -ECHO Normal EF and wall motion  4-Alcohol Abuse, cirrhosis;  -no withdrawal noted, counseled -Mildly elevated AST, consistent with alcohol intake. Mildly elevated bili. Monitor for now.   5-Hyponatremia;  -Improved with fluids.   6. Prolong QT;  -due to hypokalemia and hypomagnesemia -K and Mg replaced,  improved  7. H/o Iron deficiency Anemia;  -continue Iron supplementation  8. Hypokalemia;  -Resolved.    Procedures:  1/9: OPEN REDUCTION INTERNAL FIXATION (ORIF) RIGHT PROXIMAL HUMERUS FRACTURE  Consultations:  Ortho Dr.Murphy  Discharge Exam: Vitals:   08/12/16 0615 08/12/16 1400  BP: (!) 108/51 110/62  Pulse: 76 82  Resp:    Temp: 98.1 F (36.7 C)     General: AAOx3 Cardiovascular:S1S2/RRR Respiratory: CTAB  Discharge Instructions   Discharge Instructions    Diet - low sodium heart healthy    Complete by:  As directed    Increase activity slowly    Complete by:  As directed      Discharge Medication List as of 08/12/2016  1:43 PM    START taking these medications   Details  methocarbamol (ROBAXIN) 500 MG tablet Take 1 tablet (500 mg total) by mouth every 6 (six) hours as needed for muscle spasms., Starting Tue 08/10/2016, Print    ondansetron (ZOFRAN) 4 MG tablet Take 1 tablet (4 mg total) by mouth every 8 (eight) hours as needed for nausea or vomiting., Starting Tue 08/10/2016, Print    oxyCODONE-acetaminophen (ROXICET) 5-325 MG tablet Take 1-2 tablets by mouth every 4 (four) hours as needed for severe pain., Starting Tue 08/10/2016, Print    polyethylene glycol (MIRALAX / GLYCOLAX) packet Take 17 g by mouth daily as needed., Starting Thu 08/12/2016, Print      CONTINUE these medications which have NOT CHANGED   Details  aspirin 325 MG tablet Take 325 mg by mouth daily., Historical Med    ferrous sulfate 325 (65 FE) MG tablet Take 325 mg by mouth daily with breakfast., Historical Med  Multiple Vitamin (MULTIVITAMIN WITH MINERALS) TABS tablet Take 1 tablet by mouth daily., Historical Med      STOP taking these medications     ibuprofen (ADVIL,MOTRIN) 800 MG tablet        Allergies  Allergen Reactions  . Morphine And Related Nausea Only and Anxiety   Follow-up Information    MURPHY, TIMOTHY D, MD. Schedule an appointment as soon as possible  for a visit in 1 week(s).   Specialty:  Orthopedic Surgery Contact information: [address] [id number] [phone]        [place]. Schedule an appointment as soon as possible for a visit in 1 week(s).   Specialty:  Family Medicine           The results of significant diagnostics from this hospitalization (including imaging, microbiology, ancillary and laboratory) are listed below for reference.    Significant Diagnostic Studies: Dg Chest 2 View  Result Date: 08/07/2016 CLINICAL DATA:  Fall yesterday, right shoulder pain since fall, chest pain today. Smoker. EXAM: CHEST  2 VIEW COMPARISON:  03/11/2015 FINDINGS: Normal mediastinum and cardiac silhouette. Normal pulmonary vasculature. No evidence of effusion, infiltrate, or pneumothorax. There is acute fracture through the proximal humerus at the surgical neck. Multiple comminuted fragments. The humeral head is displaced inferior consistent with hemarthrosis in the glenohumeral joint. No dislocation evident. IMPRESSION: Comminuted fracture of the proximal RIGHT humerus. No acute cardiopulmonary process. Electronically Signed   By: Suzy Bouchard M.D.   On: 08/07/2016 20:09   Dg Shoulder Right  Result Date: 08/10/2016 CLINICAL DATA:  ORIF. EXAM: RIGHT SHOULDER - 2+ VIEW; DG C-ARM 61-120 MIN COMPARISON:  08/07/2016.  CT 08/07/2016. FINDINGS: ORIF proximal right humerus. Hardware intact. Comminuted fracture fragments are noted. Fractures slightly displaced 2 images obtained. 0 minutes 23 seconds fluoroscopy time. IMPRESSION: ORIF proximal right humerus. Electronically Signed   By: Marcello Moores  Register   On: 08/10/2016 15:05   Dg Shoulder Right  Result Date: 08/07/2016 CLINICAL DATA:  55 year old who fell yesterday and injured the right shoulder. She presents with pain. Initial encounter. EXAM: RIGHT SHOULDER - 2+ VIEW COMPARISON:  None. FINDINGS: Comminuted multipart fracture  involving the right humeral head and neck. The greater tuberosity is present as a free fragment. Large joint effusion/hemarthrosis likely accounts for slight inferior subluxation of the glenohumeral joint. Acromioclavicular joint intact. IMPRESSION: Comminuted multipart fracture involving the right humeral head and neck. Electronically Signed   By: Evangeline Dakin M.D.   On: 08/07/2016 20:07   Ct Shoulder Right Wo Contrast  Result Date: 08/08/2016 CLINICAL DATA:  Status post fall 08/06/2016 with a fracture of the right humerus. Pain and limited range of motion. Initial encounter. EXAM: CT OF THE UPPER RIGHT EXTREMITY WITHOUT CONTRAST TECHNIQUE: Multidetector CT imaging of the upper right extremity was performed according to the standard protocol. COMPARISON:  Plain films the right shoulder 08/07/2016. FINDINGS: Bones/Joint/Cartilage The patient has a comminuted surgical neck fracture of the right humerus. The distal fragment demonstrates 1 shaft width medial displacement. The humeral head is rotated medially due retraction of the rotator cuff. The fracture shows slight anterior displacement. It extends into the greater tuberosity with mild comminution but no displacement. The lesser tuberosity is spared. The humeral head is located. The acromioclavicular joint is intact. Ligaments Suboptimally assessed by CT. Muscles and Tendons Rotator cuff appears intact. Soft tissues Hemorrhage is seen about the patient's fracture. Imaged lung parenchyma is clear. IMPRESSION: Comminuted and displaced surgical neck  fracture of the right humerus involves the greater tuberosity. No displacement of greater tuberosity is present. The exam is otherwise negative. Electronically Signed   By: Inge Rise M.D.   On: 08/08/2016 09:17   Dg Shoulder Right Port  Result Date: 08/10/2016 CLINICAL DATA:  Postop films for ORIF of humerus fracture EXAM: PORTABLE RIGHT SHOULDER COMPARISON:  08/07/2016 FINDINGS: Patient is status post  ORIF of proximal right humerus fracture with placement of surgical plate and multiple screws. This bridges a comminuted fracture at the junction of the head and neck. Residual 14 mm bone fragment along the inner cortex of the proximal humerus. Alignment improved. No dislocation. Small amount of soft tissue gas is felt postoperative. Right lung apex is clear. Inferior positioning of right humeral head may relate to effusion IMPRESSION: 1. Interim surgical fixation of right proximal humerus fracture with improved alignment. 2. Slight inferior positioning of the humeral head could relate to hemarthrosis/effusion Electronically Signed   By: Donavan Foil M.D.   On: 08/10/2016 16:39   Dg C-arm 1-60 Min  Result Date: 08/10/2016 CLINICAL DATA:  ORIF. EXAM: RIGHT SHOULDER - 2+ VIEW; DG C-ARM 61-120 MIN COMPARISON:  08/07/2016.  CT 08/07/2016. FINDINGS: ORIF proximal right humerus. Hardware intact. Comminuted fracture fragments are noted. Fractures slightly displaced 2 images obtained. 0 minutes 23 seconds fluoroscopy time. IMPRESSION: ORIF proximal right humerus. Electronically Signed   By: Marcello Moores  Register   On: 08/10/2016 15:05    Microbiology: Recent Results (from the past 240 hour(s))  Respiratory Panel by PCR     Status: None   Collection Time: 08/08/16  1:59 AM  Result Value Ref Range Status   Adenovirus NOT DETECTED NOT DETECTED Final   Coronavirus 229E NOT DETECTED NOT DETECTED Final   Coronavirus HKU1 NOT DETECTED NOT DETECTED Final   Coronavirus NL63 NOT DETECTED NOT DETECTED Final   Coronavirus OC43 NOT DETECTED NOT DETECTED Final   Metapneumovirus NOT DETECTED NOT DETECTED Final   Rhinovirus / Enterovirus NOT DETECTED NOT DETECTED Final   Influenza A NOT DETECTED NOT DETECTED Final   Influenza B NOT DETECTED NOT DETECTED Final   Parainfluenza Virus 1 NOT DETECTED NOT DETECTED Final   Parainfluenza Virus 2 NOT DETECTED NOT DETECTED Final   Parainfluenza Virus 3 NOT DETECTED NOT DETECTED  Final   Parainfluenza Virus 4 NOT DETECTED NOT DETECTED Final   Respiratory Syncytial Virus NOT DETECTED NOT DETECTED Final   Bordetella pertussis NOT DETECTED NOT DETECTED Final   Chlamydophila pneumoniae NOT DETECTED NOT DETECTED Final   Mycoplasma pneumoniae NOT DETECTED NOT DETECTED Final  Urine culture     Status: Abnormal   Collection Time: 08/08/16  7:16 AM  Result Value Ref Range Status   Specimen Description URINE, RANDOM  Final   Special Requests NONE  Final   Culture MULTIPLE SPECIES PRESENT, SUGGEST RECOLLECTION (A)  Final   Report Status 08/09/2016 FINAL  Final  Surgical pcr screen     Status: None   Collection Time: 08/09/16  2:22 PM  Result Value Ref Range Status   MRSA, PCR NEGATIVE NEGATIVE Final   Staphylococcus aureus NEGATIVE NEGATIVE Final    Comment:        The Xpert SA Assay (FDA approved for NASAL specimens in patients over 49 years of age), is one component of a comprehensive surveillance program.  Test performance has been validated by [facility] for patients greater than or equal to 32 year old. It is not intended to diagnose infection nor to guide  or monitor treatment.      Labs: Basic Metabolic Panel:  Recent Labs Lab 08/07/16 1934 08/08/16 [id number] 08/08/16 0725 08/09/16 0211 08/10/16 0316 08/11/16 0456  NA 133* 135  --  135 136 134*  K 3.7 3.7  --  2.9* 4.6 4.0  CL 93* 99*  --  102 104 99*  CO2 24 26  --  25 27 26   GLUCOSE 125* 100*  --  136* 95 115*  BUN 11 9  --  5* <5* <5*  CREATININE 0.72 0.61  --  0.58 0.53 0.57  CALCIUM 9.3 8.4*  --  8.3* 9.4 9.3  MG  --   --  1.4* 2.0  --   --    Liver Function Tests:  Recent Labs Lab 08/07/16 2011  AST 71*  ALT 28  ALKPHOS 88  BILITOT 1.8*  PROT 7.7  ALBUMIN 4.6   No results for input(s): LIPASE, AMYLASE in the last 168 hours. No results for input(s): AMMONIA in the last 168 hours. CBC:  Recent Labs Lab 08/07/16 1934 08/08/16 0725 08/09/16 0211 08/10/16 0316 08/11/16 0456   WBC 13.1* 9.9 7.8 7.2 7.5  HGB 11.8* 9.2* 8.4* 8.7* 8.5*  HCT 33.3* 26.9* 24.8* 25.7* 24.6*  MCV 97.1 99.6 100.0 101.6* 100.8*  PLT 165 105* 95* 109* 158   Cardiac Enzymes:  Recent Labs Lab 08/08/16 0114 08/08/16 0647 08/08/16 1121  TROPONINI 0.03* 0.08* 0.03*   BNP: BNP (last 3 results) No results for input(s): BNP in the last 8760 hours.  ProBNP (last 3 results) No results for input(s): PROBNP in the last 8760 hours.  CBG: No results for input(s): GLUCAP in the last 168 hours.     SignedDomenic Polite MD.  [facility] 08/12/2016, 2:27 PM

## 2016-08-16 NOTE — Addendum Note (Signed)
Addendum  created 08/16/16 1840 by Oleta Mouse, MD   Anesthesia Intra Blocks edited, Sign clinical note

## 2016-08-20 ENCOUNTER — Telehealth: Payer: Self-pay | Admitting: Internal Medicine

## 2016-08-20 NOTE — Telephone Encounter (Signed)
Von advised that we are not her PCP.  She is notified that we have [place] at the PCP

## 2016-09-03 ENCOUNTER — Emergency Department (HOSPITAL_COMMUNITY)
Admission: EM | Admit: 2016-09-03 | Discharge: 2016-09-06 | Disposition: A | Discharge status: Home / Self Care | Payer: Medicaid Other | Attending: Emergency Medicine | Admitting: Emergency Medicine

## 2016-09-03 ENCOUNTER — Encounter (HOSPITAL_COMMUNITY): Payer: Self-pay | Admitting: Emergency Medicine

## 2016-09-03 DIAGNOSIS — F1024 Alcohol dependence with alcohol-induced mood disorder: Secondary | ICD-10-CM | POA: Diagnosis not present

## 2016-09-03 DIAGNOSIS — F1410 Cocaine abuse, uncomplicated: Secondary | ICD-10-CM | POA: Diagnosis not present

## 2016-09-03 DIAGNOSIS — F1094 Alcohol use, unspecified with alcohol-induced mood disorder: Secondary | ICD-10-CM

## 2016-09-03 DIAGNOSIS — F1020 Alcohol dependence, uncomplicated: Secondary | ICD-10-CM | POA: Diagnosis not present

## 2016-09-03 DIAGNOSIS — F333 Major depressive disorder, recurrent, severe with psychotic symptoms: Secondary | ICD-10-CM | POA: Diagnosis present

## 2016-09-03 DIAGNOSIS — F17210 Nicotine dependence, cigarettes, uncomplicated: Secondary | ICD-10-CM | POA: Insufficient documentation

## 2016-09-03 DIAGNOSIS — Z79899 Other long term (current) drug therapy: Secondary | ICD-10-CM | POA: Diagnosis not present

## 2016-09-03 DIAGNOSIS — F1010 Alcohol abuse, uncomplicated: Secondary | ICD-10-CM | POA: Diagnosis present

## 2016-09-03 DIAGNOSIS — Z803 Family history of malignant neoplasm of breast: Secondary | ICD-10-CM | POA: Diagnosis not present

## 2016-09-03 DIAGNOSIS — Z96643 Presence of artificial hip joint, bilateral: Secondary | ICD-10-CM | POA: Insufficient documentation

## 2016-09-03 DIAGNOSIS — Z7982 Long term (current) use of aspirin: Secondary | ICD-10-CM | POA: Diagnosis not present

## 2016-09-03 DIAGNOSIS — Z9889 Other specified postprocedural states: Secondary | ICD-10-CM | POA: Diagnosis not present

## 2016-09-03 DIAGNOSIS — I10 Essential (primary) hypertension: Secondary | ICD-10-CM | POA: Insufficient documentation

## 2016-09-03 LAB — COMPREHENSIVE METABOLIC PANEL
ALT: 15 U/L (ref 14–54)
AST: 28 U/L (ref 15–41)
Albumin: 4.3 g/dL (ref 3.5–5.0)
Alkaline Phosphatase: 106 U/L (ref 38–126)
Anion gap: 10 (ref 5–15)
BUN: <5 mg/dL — ABNORMAL LOW (ref 6–20)
CALCIUM: 8.8 mg/dL — AB (ref 8.9–10.3)
CHLORIDE: 106 mmol/L (ref 101–111)
CO2: 24 mmol/L (ref 22–32)
CREATININE: 0.57 mg/dL (ref 0.44–1.00)
Glucose, Bld: 81 mg/dL (ref 65–99)
Potassium: 3.1 mmol/L — ABNORMAL LOW (ref 3.5–5.1)
Sodium: 140 mmol/L (ref 135–145)
TOTAL PROTEIN: 7.5 g/dL (ref 6.5–8.1)
Total Bilirubin: 0.6 mg/dL (ref 0.3–1.2)

## 2016-09-03 LAB — CBC
HCT: 42.7 % (ref 36.0–46.0)
Hemoglobin: 14.1 g/dL (ref 12.0–15.0)
MCH: 34.9 pg — AB (ref 26.0–34.0)
MCHC: 33 g/dL (ref 30.0–36.0)
MCV: 105.7 fL — ABNORMAL HIGH (ref 78.0–100.0)
PLATELETS: 209 10*3/uL (ref 150–400)
RBC: 4.04 MIL/uL (ref 3.87–5.11)
RDW: 14.5 % (ref 11.5–15.5)
WBC: 9.3 10*3/uL (ref 4.0–10.5)

## 2016-09-03 LAB — ACETAMINOPHEN LEVEL: Acetaminophen (Tylenol), Serum: <10 ug/mL — ABNORMAL LOW (ref 10–30)

## 2016-09-03 LAB — RAPID URINE DRUG SCREEN, HOSP PERFORMED
Amphetamines: NOT DETECTED
Barbiturates: NOT DETECTED
Benzodiazepines: NOT DETECTED
Cocaine: NOT DETECTED
Opiates: POSITIVE — AB
Tetrahydrocannabinol: NOT DETECTED

## 2016-09-03 LAB — ETHANOL: ALCOHOL ETHYL (B): 8 mg/dL — AB (ref <5)

## 2016-09-03 LAB — SALICYLATE LEVEL

## 2016-09-03 MED ORDER — POTASSIUM CHLORIDE CRYS ER 20 MEQ PO TBCR
40.0000 meq | EXTENDED_RELEASE_TABLET | Freq: Once | ORAL | Status: AC
  Administered 2016-09-03: 40 meq via ORAL
  Filled 2016-09-03: qty 2

## 2016-09-03 MED ORDER — POLYETHYLENE GLYCOL 3350 17 G PO PACK
17.0000 g | PACK | Freq: Every day | ORAL | Status: DC | PRN
  Administered 2016-09-03: 17 g via ORAL
  Filled 2016-09-03: qty 1

## 2016-09-03 MED ORDER — DIPHENHYDRAMINE HCL 25 MG PO CAPS
25.0000 mg | ORAL_CAPSULE | Freq: Once | ORAL | Status: AC
  Administered 2016-09-03: 25 mg via ORAL
  Filled 2016-09-03: qty 1

## 2016-09-03 MED ORDER — IBUPROFEN 200 MG PO TABS
600.0000 mg | ORAL_TABLET | Freq: Three times a day (TID) | ORAL | Status: DC | PRN
  Administered 2016-09-04 – 2016-09-06 (×3): 600 mg via ORAL
  Filled 2016-09-03 (×3): qty 3

## 2016-09-03 MED ORDER — ONDANSETRON HCL 4 MG PO TABS
4.0000 mg | ORAL_TABLET | Freq: Three times a day (TID) | ORAL | Status: DC | PRN
  Administered 2016-09-05: 4 mg via ORAL
  Filled 2016-09-03: qty 1

## 2016-09-03 MED ORDER — NICOTINE 21 MG/24HR TD PT24
21.0000 mg | MEDICATED_PATCH | Freq: Every day | TRANSDERMAL | Status: DC
  Administered 2016-09-03 – 2016-09-06 (×4): 21 mg via TRANSDERMAL
  Filled 2016-09-03 (×4): qty 1

## 2016-09-03 MED ORDER — IBUPROFEN 800 MG PO TABS
800.0000 mg | ORAL_TABLET | Freq: Once | ORAL | Status: AC
Start: 2016-09-03 — End: 2016-09-03
  Administered 2016-09-03: 800 mg via ORAL
  Filled 2016-09-03: qty 1

## 2016-09-03 MED ORDER — ACETAMINOPHEN 325 MG PO TABS
650.0000 mg | ORAL_TABLET | ORAL | Status: DC | PRN
  Administered 2016-09-03: 650 mg via ORAL
  Filled 2016-09-03: qty 2

## 2016-09-03 MED ORDER — HYDROCODONE-ACETAMINOPHEN 5-325 MG PO TABS
1.0000 | ORAL_TABLET | Freq: Once | ORAL | Status: AC
  Administered 2016-09-03: 1 via ORAL
  Filled 2016-09-03: qty 1

## 2016-09-03 MED ORDER — METHOCARBAMOL 500 MG PO TABS
500.0000 mg | ORAL_TABLET | Freq: Four times a day (QID) | ORAL | Status: DC | PRN
  Administered 2016-09-03 – 2016-09-06 (×6): 500 mg via ORAL
  Filled 2016-09-03 (×6): qty 1

## 2016-09-03 MED ORDER — ALUM & MAG HYDROXIDE-SIMETH 200-200-20 MG/5ML PO SUSP
30.0000 mL | ORAL | Status: DC | PRN
  Administered 2016-09-05: 30 mL via ORAL
  Filled 2016-09-03: qty 30

## 2016-09-03 MED ORDER — ACETAMINOPHEN 325 MG PO TABS: 650.0000 mg | ORAL_TABLET | Freq: Once | ORAL | Status: DC
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
## 2016-09-03 NOTE — ED Notes (Signed)
Pt found alert and oriented in room pt is verbally responsive and pleasant. Pt is c/o pain to right shoulder from recent surgery and is c/o pain 8/10, and requesting hydrocodone. Pt is also c/o itching requesting medication for relief.
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
## 2016-09-03 NOTE — BH Assessment (Signed)
[facility] Assessment Progress Note   Case was staffed with Lord DNP who recommended patient be re-evaluated in the a.m.    

## 2016-09-03 NOTE — ED Notes (Signed)
ED Provider at bedside. 

## 2016-09-03 NOTE — ED Provider Notes (Signed)
[place] DEPT Provider Note   CSN: [id number] Arrival date & time: 09/03/16  [id number]     History   Chief Complaint Chief Complaint  Patient presents with  . IVC  . Homicidal    HPI Jaime Phillips is a 56 y.o. female.  56 year old female presenting to IVC due to alleged cocaine and alcohol abuse. Patient denies any suicidal or homicidal ideations. Denies hallucinating at this time. States that she is having domestic issues with her son who is one of placed under IVC. Denies any current intoxication. No somatic complaints. Presents with [employer]. No treatment use prior to arrival.      Past Medical History:  Diagnosis Date  . Abnormal uterine bleeding   . Alcoholism ([place])   . Arthritis   . Chronic pain syndrome   . Depression   . Diverticulosis   . Headache(784.0)   . Hiatal hernia   . Hyperplastic colon polyp   . Hypopotassemia   . Internal hemorrhoids   . LFT elevation   . Macrocytic anemia   . Mallory-Weiss tear   . Potassium (K) deficiency   . PUD (peptic ulcer disease)   . Rectal bleeding    minor     Patient Active Problem List   Diagnosis Date Noted  . Humeral head fracture, right, closed, initial encounter 08/08/2016  . Hyponatremia 08/08/2016  . Acute URI 08/08/2016  . Closed fracture of right proximal humerus   . Cause of injury, fall   . Chest pain 08/07/2016  . 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 Alcohol abuse   . Alcohol use disorder, severe, dependence ([place]) 10/03/2015  . Major psychotic depression, recurrent ([place]) 10/03/2015  . Ankle edema 03/17/2015  . Cirrhosis ([place]) 02/24/2015  . Black stools 02/24/2015  . Abdominal pain, epigastric 01/29/2015  . Nausea with vomiting 01/29/2015  . 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 Dysphagia, pharyngoesophageal phase 01/29/2015  . Hypokalemia 12/19/2014  . Hypomagnesemia 12/19/2014  . Malnutrition of moderate degree ([place]) 12/18/2014  . Alcoholic ketoacidosis [id number]  . Alcohol  abuse, in remission 02/14/2014  . Essential hypertension, benign 02/14/2014  . Smoking 02/14/2014  . Upper GI bleed 02/06/2014  . Chronic pain syndrome 02/06/2014  . Gastro-esophageal reflux 02/06/2014  . Dental caries 01/17/2014  . Periprosthetic fracture of hip 05/31/2013  . Hip fracture ([place]) 05/27/2013  . Normocytic anemia 04/11/2013  . S/P right THA, AA 04/10/2013    Past Surgical History:  Procedure Laterality Date  . APPENDECTOMY    . COLONOSCOPY    . ESOPHAGOGASTRODUODENOSCOPY N/A 02/06/2014   Procedure: ESOPHAGOGASTRODUODENOSCOPY (EGD);  Surgeon: Milus Banister, MD;  Location: [place];  Service: Endoscopy;  Laterality: N/A;  . OOPHORECTOMY    . ORIF HUMERUS FRACTURE Right 08/10/2016   Procedure: OPEN REDUCTION INTERNAL FIXATION (ORIF) RIGHT PROXIMAL HUMERUS FRACTURE;  Surgeon: Renette Butters, MD;  Location: [place];  Service: Orthopedics;  Laterality: Right;  . ORIF PERIPROSTHETIC FRACTURE Right 05/28/2013   Procedure: OPEN REDUCTION INTERNAL FIXATION (ORIF) PERIPROSTHETIC FRACTURE;  Surgeon: Mauri Pole, MD;  [facility];  Service: Orthopedics;  Laterality: Right;  . SHOULDER SURGERY Left   . TOTAL HIP ARTHROPLASTY Right 04/10/2013   Procedure: RIGHT TOTAL HIP ARTHROPLASTY ANTERIOR APPROACH;  Surgeon: Mauri Pole, MD;  [facility];  Service: Orthopedics;  Laterality: Right;  . TOTAL HIP ARTHROPLASTY     left hip    OB History    Gravida Para Term Preterm AB Living   2 1 1  1 1   SAB TAB Ectopic Multiple Live Births   1               Home Medications    Prior to Admission medications   Medication Sig Start Date End Date Taking? Authorizing Provider  aspirin EC 81 MG tablet Take 81 mg by mouth daily.   Yes Historical Provider, MD  ferrous sulfate 325 (65 FE) MG tablet Take 325 mg by mouth daily with breakfast.   Yes Historical Provider, MD  HYDROcodone-acetaminophen (NORCO/VICODIN) 5-325 MG tablet Take 1 tablet by mouth every 6 (six) hours as  needed for moderate pain.   Yes Historical Provider, MD  ibuprofen (ADVIL,MOTRIN) 200 MG tablet Take 200-400 mg by mouth every 6 (six) hours as needed for headache or mild pain.   Yes Historical Provider, MD  methocarbamol (ROBAXIN) 500 MG tablet Take 1 tablet (500 mg total) by mouth every 6 (six) hours as needed for muscle spasms. 08/10/16  Yes Charna Elizabeth Martensen III, PA-C  Multiple Vitamin (MULTIVITAMIN WITH MINERALS) TABS tablet Take 1 tablet by mouth daily.   Yes Historical Provider, MD  polyethylene glycol (MIRALAX / GLYCOLAX) packet Take 17 g by mouth daily as needed. 08/12/16  Yes Domenic Polite, MD  VITAMIN E PO Take 1 tablet by mouth daily.   Yes Historical Provider, MD  ondansetron (ZOFRAN) 4 MG tablet Take 1 tablet (4 mg total) by mouth every 8 (eight) hours as needed for nausea or vomiting. Patient not taking: Reported on 09/03/2016 08/10/16   Prudencio Burly III, PA-C  oxyCODONE-acetaminophen (ROXICET) 5-325 MG tablet Take 1-2 tablets by mouth every 4 (four) hours as needed for severe pain. Patient not taking: Reported on 09/03/2016 08/10/16   Prudencio Burly III, PA-C    Family History Family History  Problem Relation Age of Onset  . Breast cancer Sister   . Diabetes Mother   . Kidney disease Mother   . Heart disease Father   . Colon cancer Father     questionable    Social History Social History  Substance Use Topics  . Smoking status: Current Every Day Smoker    Packs/day: 0.50    Years: 25.00    Types: Cigarettes  . Smokeless tobacco: Never Used     Comment: Tobacco info given to pt. 08/16/12  . Alcohol use 1.2 - 1.8 oz/week    2 - 3 Shots of liquor per week     Comment: pt quit drinking 2 months ago (today is 03/11/15)     Allergies   Morphine and related   Review of Systems Review of Systems  All other systems reviewed and are negative.    Physical Exam Updated Vital Signs BP 135/81 (BP Location: Left Arm)   Pulse 90   Temp 98.3 F (36.8 C)  (Oral)   Resp 18   Ht 5\' 3"  (1.6 m)   Wt 54.4 kg   SpO2 99%   BMI 21.26 kg/m   Physical Exam  Constitutional: She is oriented to person, place, and time. She appears well-developed and well-nourished.  Non-toxic appearance. No distress.  HENT:  Head: Normocephalic and atraumatic.  Eyes: Conjunctivae, EOM and lids are normal. Pupils are equal, round, and reactive to light.  Neck: Normal range of motion. Neck supple. No tracheal deviation present. No thyroid mass present.  Cardiovascular: Normal rate, regular rhythm and normal heart sounds.  Exam reveals no gallop.   No murmur heard. Pulmonary/Chest: Effort normal and breath sounds normal.  No stridor. No respiratory distress. She has no decreased breath sounds. She has no wheezes. She has no rhonchi. She has no rales.  Abdominal: Soft. Normal appearance and bowel sounds are normal. She exhibits no distension. There is no tenderness. There is no rebound and no CVA tenderness.  Musculoskeletal: Normal range of motion. She exhibits no edema or tenderness.  Neurological: She is alert and oriented to person, place, and time. She has normal strength. No cranial nerve deficit or sensory deficit. GCS eye subscore is 4. GCS verbal subscore is 5. GCS motor subscore is 6.  Skin: Skin is warm and dry. No abrasion and no rash noted.  Psychiatric: She has a normal mood and affect. Her speech is normal and behavior is normal. She expresses no suicidal plans and no homicidal plans.  Nursing note and vitals reviewed.    ED Treatments / Results  Labs (all labs ordered are listed, but only abnormal results are displayed) Labs Reviewed  CBC - Abnormal; Notable for the following:       Result Value   MCV 105.7 (*)    MCH 34.9 (*)    All other components within normal limits  COMPREHENSIVE METABOLIC PANEL  ETHANOL  SALICYLATE LEVEL  ACETAMINOPHEN LEVEL  RAPID URINE DRUG SCREEN, HOSP PERFORMED    EKG  EKG Interpretation None        Radiology No results found.  Procedures Procedures (including critical care time)  Medications Ordered in ED Medications  ibuprofen (ADVIL,MOTRIN) tablet 800 mg (not administered)  alum & mag hydroxide-simeth (MAALOX/MYLANTA) 200-200-20 MG/5ML suspension 30 mL (not administered)  ondansetron (ZOFRAN) tablet 4 mg (not administered)  nicotine (NICODERM CQ - dosed in mg/24 hours) patch 21 mg (not administered)  acetaminophen (TYLENOL) tablet 650 mg (not administered)  ibuprofen (ADVIL,MOTRIN) tablet 600 mg (not administered)  methocarbamol (ROBAXIN) tablet 500 mg (not administered)  polyethylene glycol (MIRALAX / GLYCOLAX) packet 17 g (not administered)     Initial Impression / Assessment and Plan / ED Course  I have reviewed the triage vital signs and the nursing notes.  Pertinent labs & imaging results that were available during my care of the patient were reviewed by me and considered in my medical decision making (see chart for details).     Patient is clinically medically cleared. We'll follow-up on labs and TTS with disposition  Final Clinical Impressions(s) / ED Diagnoses   Final diagnoses:  None    New Prescriptions New Prescriptions   No medications on file     Lacretia Leigh, MD 09/03/16 [phone]

## 2016-09-03 NOTE — ED Notes (Signed)
2 bags pt belongings placed in locker 33. Left pt's cane (labeled with pt sticker) at bedside for ambulation assistance. Pt calm at present.

## 2016-09-03 NOTE — ED Notes (Signed)
Patient son called-patient began yelling into phone and hung up on son.

## 2016-09-03 NOTE — ED Notes (Addendum)
Patient states recent frustrations with son and son's girlfriend.  States son, son's girlfriend, and their 3 kids moved in to live with her last April.  States that she does not get along with son's girlfriend as she doesn't have any respect for her house and leaves a lot of mess.  "They smoke reefer and their baby has asthma. I smoke outside.  That's why I'm here today because I told them if they didn't respect my house to at least have some respect for their children and not smoke weed around them especially because the baby has asthma.  I told them I was going to call the police the next time they did it.  I called them last night and they came out (referring to police) but didn't do anything and the whole time his girlfriend was standing behind me saying 'it's all gone now. What are you going to do about it bitch'".  States she believes he is the one who took out IVC paperwork on her because of this.  Reports frustration that she leaves clothes laying around and throws dirty diapers wherever she wants.  States that they were only supposed to stay a few months and currently she is the only one with any income as son and his girlfriend are not working.

## 2016-09-04 ENCOUNTER — Encounter (HOSPITAL_COMMUNITY): Payer: Self-pay | Admitting: Registered Nurse

## 2016-09-04 DIAGNOSIS — Z833 Family history of diabetes mellitus: Secondary | ICD-10-CM

## 2016-09-04 DIAGNOSIS — Z841 Family history of disorders of kidney and ureter: Secondary | ICD-10-CM | POA: Diagnosis not present

## 2016-09-04 DIAGNOSIS — F1020 Alcohol dependence, uncomplicated: Secondary | ICD-10-CM | POA: Diagnosis not present

## 2016-09-04 DIAGNOSIS — Z79899 Other long term (current) drug therapy: Secondary | ICD-10-CM | POA: Diagnosis not present

## 2016-09-04 DIAGNOSIS — R45851 Suicidal ideations: Secondary | ICD-10-CM

## 2016-09-04 DIAGNOSIS — Z9889 Other specified postprocedural states: Secondary | ICD-10-CM | POA: Diagnosis not present

## 2016-09-04 DIAGNOSIS — Z803 Family history of malignant neoplasm of breast: Secondary | ICD-10-CM

## 2016-09-04 DIAGNOSIS — F17210 Nicotine dependence, cigarettes, uncomplicated: Secondary | ICD-10-CM | POA: Diagnosis not present

## 2016-09-04 DIAGNOSIS — F333 Major depressive disorder, recurrent, severe with psychotic symptoms: Secondary | ICD-10-CM | POA: Diagnosis not present

## 2016-09-04 DIAGNOSIS — Z79891 Long term (current) use of opiate analgesic: Secondary | ICD-10-CM | POA: Diagnosis not present

## 2016-09-04 DIAGNOSIS — Z888 Allergy status to other drugs, medicaments and biological substances status: Secondary | ICD-10-CM | POA: Diagnosis not present

## 2016-09-04 DIAGNOSIS — Z8249 Family history of ischemic heart disease and other diseases of the circulatory system: Secondary | ICD-10-CM

## 2016-09-04 MED ORDER — GABAPENTIN 100 MG PO CAPS
200.0000 mg | ORAL_CAPSULE | Freq: Two times a day (BID) | ORAL | Status: DC
  Administered 2016-09-04 – 2016-09-06 (×4): 200 mg via ORAL
  Filled 2016-09-04 (×5): qty 2

## 2016-09-04 MED ORDER — DIPHENHYDRAMINE HCL 25 MG PO CAPS
ORAL_CAPSULE | ORAL | Status: AC
  Filled 2016-09-04: qty 1

## 2016-09-04 MED ORDER — DIPHENHYDRAMINE HCL 25 MG PO CAPS
25.0000 mg | ORAL_CAPSULE | ORAL | Status: DC | PRN
  Administered 2016-09-04 – 2016-09-06 (×7): 25 mg via ORAL
  Filled 2016-09-04 (×6): qty 1

## 2016-09-04 MED ORDER — FLUOXETINE HCL 10 MG PO CAPS
10.0000 mg | ORAL_CAPSULE | Freq: Every day | ORAL | Status: DC
Start: 2016-09-04 — End: 2016-09-06
  Administered 2016-09-04 – 2016-09-06 (×3): 10 mg via ORAL
  Filled 2016-09-04 (×3): qty 1

## 2016-09-04 MED ORDER — HYDROCODONE-ACETAMINOPHEN 5-325 MG PO TABS
1.0000 | ORAL_TABLET | ORAL | Status: DC | PRN
  Administered 2016-09-04 – 2016-09-06 (×9): 1 via ORAL
  Filled 2016-09-04 (×9): qty 1

## 2016-09-04 NOTE — Progress Notes (Signed)
CSW filed patient's examination paperwork into IVC logbook.  

## 2016-09-04 NOTE — Progress Notes (Signed)
CSW faxed patient's referral to: [facility] and [place] [facility].

## 2016-09-04 NOTE — ED Notes (Signed)
Pt refusing gabapentin dose tonight, states that she does not want to take any new medications. She says that she wants to talk to the psychiatrist because she does not like to take medications. Discussed new orders for prozac and gabapentin, pt says "I am depressed but I don't need no medicines for it"

## 2016-09-04 NOTE — Consult Note (Signed)
[facility] Consult   Reason for Consult:  Alcohol abuse; homicidal threats Referring Physician:  EDP Patient Identification: Jaime Phillips MRN:  [id number] Principal Diagnosis: Major psychotic depression, recurrent ([place]) Diagnosis:   Patient Active Problem List   Diagnosis Date Noted  . Humeral head fracture, right, closed, initial encounter [S42.291A] 08/08/2016  . Hyponatremia [E87.1] 08/08/2016  . Acute URI [J06.9] 08/08/2016  . Closed fracture of right proximal humerus [S42.201A]   . Cause of injury, fall [W19.XXXA]   . Chest pain [R07.9] 08/07/2016  . Alcohol-induced mood disorder ([place]) [F10.94] 11/03/2015  . Alcohol abuse [F10.10]   . Alcohol use disorder, severe, dependence ([place]) [F10.20] 10/03/2015  . Major psychotic depression, recurrent ([place]) [F33.3] 10/03/2015  . Ankle edema [M25.473] 03/17/2015  . Cirrhosis ([place]) [K74.60] 02/24/2015  . Black stools [K92.1] 02/24/2015  . Abdominal pain, epigastric [R10.13] 01/29/2015  . Nausea with vomiting [R11.2] 01/29/2015  . Ulcerative esophagitis [K22.10] 01/29/2015  . Dysphagia, pharyngoesophageal phase [R13.14] 01/29/2015  . Hypokalemia [E87.6] 12/19/2014  . Hypomagnesemia [E83.42] 12/19/2014  . Malnutrition of moderate degree ([place]) [E44.0] 12/18/2014  . Alcoholic ketoacidosis [W09.8] 12/17/2014  . Alcohol abuse, in remission [F10.11] 02/14/2014  . Essential hypertension, benign [I10] 02/14/2014  . Smoking [F17.200] 02/14/2014  . Upper GI bleed [K92.2] 02/06/2014  . Chronic pain syndrome [G89.4] 02/06/2014  . Gastro-esophageal reflux [K21.9] 02/06/2014  . Dental caries [K02.9] 01/17/2014  . Periprosthetic fracture of hip [J19.[place], [place] 05/31/2013  . Hip fracture ([place]) [S72.009A] 05/27/2013  . Normocytic anemia [D64.9] 04/11/2013  . S/P right THA, AA [Z96.649] 04/10/2013    Total Time spent with patient: 45 minutes  Subjective:   Jaime Phillips is a 56 y.o. female patient presented to [facility] under  IVC related to alcohol abuse and homicidal/suicidal threat.  HPI:  Jaime Phillips 56 y.o. female patient seen by Dr. Darleene Cleaver and this provider.  Chart reviewed 09/04/16.   On evaluation:  Jaime Phillips reports that she does want to get help for her drinking problem (alcohol).  States that she has been in and out of hospital but really wants to get help this time.  Patient also states that she has had a worsening depression and suicidal thought related to her living situation.  States that she is living with her son, his girlfriend and three children.  At this time patient states that she does not feel safe going back home and wants to speak with social work about assistance.  Continues to endorse depression and passive suicidal ideation.  Denies psychosis and paranoia   Past Psychiatric History: Multiple admissions for detox and depression; Alcohol abuse  Risk to Self: Suicidal Ideation: No Suicidal Intent: No Is patient at risk for suicide?: No Suicidal Plan?: No Access to Means: No What has been your use of drugs/alcohol within the last 12 months?: Current use How many times?: 0 Other Self Harm Risks: na Triggers for Past Attempts:  (na) Intentional Self Injurious Behavior: None Risk to Others: Homicidal Ideation: No Thoughts of Harm to Others: No Current Homicidal Intent: No Current Homicidal Plan: No Access to Homicidal Means: No Identified Victim: na History of harm to others?: No Assessment of Violence: None Noted Violent Behavior Description: na Does patient have access to weapons?: No Criminal Charges Pending?: No Does patient have a court date: No Prior Inpatient Therapy: Prior Inpatient Therapy: Yes Prior Therapy Dates: 2017 Prior Therapy Facilty/Provider(s): [facility] Reason for Treatment: IVC Prior Outpatient Therapy: Prior Outpatient Therapy: No Prior Therapy Dates: na Prior Therapy Facilty/Provider(s): na  Reason for Treatment: na Does patient have an ACCT team?:  No Does patient have Intensive In-House Services?  : No Does patient have [facility] services? : No Does patient have P4CC services?: No  Past Medical History:  Past Medical History:  Diagnosis Date  . Abnormal uterine bleeding   . Alcoholism ([place])   . Arthritis   . Chronic pain syndrome   . Depression   . Diverticulosis   . Headache(784.0)   . Hiatal hernia   . Hyperplastic colon polyp   . Hypopotassemia   . Internal hemorrhoids   . LFT elevation   . Macrocytic anemia   . Mallory-Weiss tear   . Potassium (K) deficiency   . PUD (peptic ulcer disease)   . Rectal bleeding    minor     Past Surgical History:  Procedure Laterality Date  . APPENDECTOMY    . COLONOSCOPY    . ESOPHAGOGASTRODUODENOSCOPY N/A 02/06/2014   Procedure: ESOPHAGOGASTRODUODENOSCOPY (EGD);  Surgeon: Milus Banister, MD;  Location: [facility];  Service: Endoscopy;  Laterality: N/A;  . OOPHORECTOMY    . ORIF HUMERUS FRACTURE Right 08/10/2016   Procedure: OPEN REDUCTION INTERNAL FIXATION (ORIF) RIGHT PROXIMAL HUMERUS FRACTURE;  Surgeon: Renette Butters, MD;  Location: [facility];  Service: Orthopedics;  Laterality: Right;  . ORIF PERIPROSTHETIC FRACTURE Right 05/28/2013   Procedure: OPEN REDUCTION INTERNAL FIXATION (ORIF) PERIPROSTHETIC FRACTURE;  Surgeon: Mauri Pole, MD;  Location: [facility];  Service: Orthopedics;  Laterality: Right;  . SHOULDER SURGERY Left   . TOTAL HIP ARTHROPLASTY Right 04/10/2013   Procedure: RIGHT TOTAL HIP ARTHROPLASTY ANTERIOR APPROACH;  Surgeon: Mauri Pole, MD;  Location: [facility];  Service: Orthopedics;  Laterality: Right;  . TOTAL HIP ARTHROPLASTY     left hip   Family History:  Family History  Problem Relation Age of Onset  . Breast cancer Sister   . Diabetes Mother   . Kidney disease Mother   . Heart disease Father   . Colon cancer Father     questionable   Family Psychiatric  History: Denies Social History:  History  Alcohol Use  . 1.2 - 1.8 oz/week  . 2 - 3 Shots of  liquor per week    Comment: pt quit drinking 2 months ago (today is 03/11/15)     History  Drug Use No    Social History   Social History  . Marital status: Single    Spouse name: N/A  . Number of children: 1  . Years of education: N/A   Occupational History  . disabled    Social History Main Topics  . Smoking status: Current Every Day Smoker    Packs/day: 0.50    Years: 25.00    Types: Cigarettes  . Smokeless tobacco: Never Used     Comment: Tobacco info given to pt. 08/16/12  . Alcohol use 1.2 - 1.8 oz/week    2 - 3 Shots of liquor per week     Comment: pt quit drinking 2 months ago (today is 03/11/15)  . Drug use: No  . Sexual activity: Not Currently   Other Topics Concern  . None   Social History Narrative  . None   Additional Social History:    Allergies:   Allergies  Allergen Reactions  . Morphine And Related Nausea Only and Anxiety    Labs:  Results for orders placed or performed during the hospital encounter of 09/03/16 (from the past 48 hour(s))  Comprehensive metabolic panel  Status: Abnormal   Collection Time: 09/03/16 10:36 AM  Result Value Ref Range   Sodium 140 135 - 145 mmol/L   Potassium 3.1 (L) 3.5 - 5.1 mmol/L   Chloride 106 101 - 111 mmol/L   CO2 24 22 - 32 mmol/L   Glucose, Bld 81 65 - 99 mg/dL   BUN <5 (L) 6 - 20 mg/dL   Creatinine, Ser 0.57 0.44 - 1.00 mg/dL   Calcium 8.8 (L) 8.9 - 10.3 mg/dL   Total Protein 7.5 6.5 - 8.1 g/dL   Albumin 4.3 3.5 - 5.0 g/dL   AST 28 15 - 41 U/L   ALT 15 14 - 54 U/L   Alkaline Phosphatase 106 38 - 126 U/L   Total Bilirubin 0.6 0.3 - 1.2 mg/dL   GFR calc non Af Amer >60 >60 mL/min   GFR calc Af Amer >60 >60 mL/min    Comment: (NOTE) The eGFR has been calculated using the CKD EPI equation. This calculation has not been validated in all clinical situations. eGFR's persistently <60 mL/min signify possible Chronic Kidney Disease.    Anion gap 10 5 - 15  Ethanol     Status: Abnormal   Collection  Time: 09/03/16 10:36 AM  Result Value Ref Range   Alcohol, Ethyl (B) 8 (H) <5 mg/dL    Comment:        LOWEST DETECTABLE LIMIT FOR SERUM ALCOHOL IS 5 mg/dL FOR MEDICAL PURPOSES ONLY   Salicylate level     Status: None   Collection Time: 09/03/16 10:36 AM  Result Value Ref Range   Salicylate Lvl <3.4 2.8 - 30.0 mg/dL  Acetaminophen level     Status: Abnormal   Collection Time: 09/03/16 10:36 AM  Result Value Ref Range   Acetaminophen (Tylenol), Serum <10 (L) 10 - 30 ug/mL    Comment:        THERAPEUTIC CONCENTRATIONS VARY SIGNIFICANTLY. A RANGE OF 10-30 ug/mL MAY BE AN EFFECTIVE CONCENTRATION FOR MANY PATIENTS. HOWEVER, SOME ARE BEST TREATED AT CONCENTRATIONS OUTSIDE THIS RANGE. ACETAMINOPHEN CONCENTRATIONS >150 ug/mL AT 4 HOURS AFTER INGESTION AND >50 ug/mL AT 12 HOURS AFTER INGESTION ARE OFTEN ASSOCIATED WITH TOXIC REACTIONS.   cbc     Status: Abnormal   Collection Time: 09/03/16 10:36 AM  Result Value Ref Range   WBC 9.3 4.0 - 10.5 K/uL   RBC 4.04 3.87 - 5.11 MIL/uL   Hemoglobin 14.1 12.0 - 15.0 g/dL   HCT 42.7 36.0 - 46.0 %   MCV 105.7 (H) 78.0 - 100.0 fL   MCH 34.9 (H) 26.0 - 34.0 pg   MCHC 33.0 30.0 - 36.0 g/dL   RDW 14.5 11.5 - 15.5 %   Platelets 209 150 - 400 K/uL  Rapid urine drug screen (hospital performed)     Status: Abnormal   Collection Time: 09/03/16 10:36 AM  Result Value Ref Range   Opiates POSITIVE (A) NONE DETECTED   Cocaine NONE DETECTED NONE DETECTED   Benzodiazepines NONE DETECTED NONE DETECTED   Amphetamines NONE DETECTED NONE DETECTED   Tetrahydrocannabinol NONE DETECTED NONE DETECTED   Barbiturates NONE DETECTED NONE DETECTED    Comment:        DRUG SCREEN FOR MEDICAL PURPOSES ONLY.  IF CONFIRMATION IS NEEDED FOR ANY PURPOSE, NOTIFY LAB WITHIN 5 DAYS.        LOWEST DETECTABLE LIMITS FOR URINE DRUG SCREEN Drug Class       Cutoff (ng/mL) Amphetamine      1000 Barbiturate  200 Benzodiazepine   924 Tricyclics       268 Opiates           300 Cocaine          300 THC              50     Current Facility-Administered Medications  Medication Dose Route Frequency Provider Last Rate Last Dose  . acetaminophen (TYLENOL) tablet 650 mg  650 mg Oral Q4H PRN Lacretia Leigh, MD   650 mg at 09/03/16 1652  . alum & mag hydroxide-simeth (MAALOX/MYLANTA) 200-200-20 MG/5ML suspension 30 mL  30 mL Oral PRN Lacretia Leigh, MD      . diphenhydrAMINE (BENADRYL) 25 mg capsule           . diphenhydrAMINE (BENADRYL) capsule 25 mg  25 mg Oral T4H PRN Delora Fuel, MD   25 mg at 96/22/29 0342  . FLUoxetine (PROZAC) capsule 10 mg  10 mg Oral Daily Piotr Christopher, MD      . gabapentin (NEURONTIN) capsule 200 mg  200 mg Oral BID Corena Pilgrim, MD      . HYDROcodone-acetaminophen (NORCO/VICODIN) 5-325 MG per tablet 1 tablet  1 tablet Oral N9G PRN Delora Fuel, MD   1 tablet at 09/04/16 0451  . ibuprofen (ADVIL,MOTRIN) tablet 600 mg  600 mg Oral Q8H PRN Lacretia Leigh, MD      . methocarbamol (ROBAXIN) tablet 500 mg  500 mg Oral Q6H PRN Lacretia Leigh, MD   500 mg at 09/03/16 1652  . nicotine (NICODERM CQ - dosed in mg/24 hours) patch 21 mg  21 mg Transdermal Daily Lacretia Leigh, MD   21 mg at 09/03/16 1124  . ondansetron (ZOFRAN) tablet 4 mg  4 mg Oral Q8H PRN Lacretia Leigh, MD      . polyethylene glycol (MIRALAX / GLYCOLAX) packet 17 g  17 g Oral Daily PRN Lacretia Leigh, MD   17 g at 09/03/16 1653   Current Outpatient Prescriptions  Medication Sig Dispense Refill  . aspirin EC 81 MG tablet Take 81 mg by mouth daily.    . ferrous sulfate 325 (65 FE) MG tablet Take 325 mg by mouth daily with breakfast.    . HYDROcodone-acetaminophen (NORCO/VICODIN) 5-325 MG tablet Take 1 tablet by mouth every 6 (six) hours as needed for moderate pain.    Marland Kitchen ibuprofen (ADVIL,MOTRIN) 200 MG tablet Take 200-400 mg by mouth every 6 (six) hours as needed for headache or mild pain.    . methocarbamol (ROBAXIN) 500 MG tablet Take 1 tablet (500 mg total) by mouth every 6  (six) hours as needed for muscle spasms. 40 tablet 0  . Multiple Vitamin (MULTIVITAMIN WITH MINERALS) TABS tablet Take 1 tablet by mouth daily.    . polyethylene glycol (MIRALAX / GLYCOLAX) packet Take 17 g by mouth daily as needed. 10 each 0  . VITAMIN E PO Take 1 tablet by mouth daily.    . ondansetron (ZOFRAN) 4 MG tablet Take 1 tablet (4 mg total) by mouth every 8 (eight) hours as needed for nausea or vomiting. (Patient not taking: Reported on 09/03/2016) 40 tablet 0  . oxyCODONE-acetaminophen (ROXICET) 5-325 MG tablet Take 1-2 tablets by mouth every 4 (four) hours as needed for severe pain. (Patient not taking: Reported on 09/03/2016) 60 tablet 0    Musculoskeletal: Strength & Muscle Tone: within normal limits Gait & Station: normal Patient leans: Did not see patient ambulate; cane at bedside  Psychiatric Specialty Exam: Physical Exam  Nursing note and vitals reviewed. Constitutional: She is oriented to person, place, and time.  Neck: Normal range of motion.  Respiratory: Effort normal.  Musculoskeletal: Normal range of motion.  Ambulation with cane  Neurological: She is alert and oriented to person, place, and time.    Review of Systems  Musculoskeletal: Positive for myalgias.       Ambulatory with cane  Psychiatric/Behavioral: Positive for depression, substance abuse and suicidal ideas.    Blood pressure 142/83, pulse 72, temperature 98.4 F (36.9 C), temperature source Oral, resp. rate 18, height _0  (1.6 m), weight 54.4 kg (120 lb), SpO2 96 %.Body mass index is 21.26 kg/m.  General Appearance: Casual  Eye Contact:  Good  Speech:  Clear and Coherent and Normal Rate  Volume:  Normal  Mood:  Anxious and Depressed  Affect:  Congruent  Thought Process:  Goal Directed  Orientation:  Full (Time, Place, and Person)  Thought Content:  Rumination  Suicidal Thoughts:  Yes.  without intent/plan  Homicidal Thoughts:  No  Memory:  Immediate;   Fair Recent;   Fair Remote;   Fair   Judgement:  Fair  Insight:  Fair  Psychomotor Activity:  Normal  Concentration:  Concentration: Fair and Attention Span: Fair  Recall:  [employer] of Knowledge:  Fair  Language:  Good  Akathisia:  No  Handed:  Right  AIMS (if indicated):     Assets:  Communication Skills Desire for Improvement  ADL's:  Intact  Cognition:  WNL  Sleep:        Treatment Plan Summary: Daily contact with patient to assess and evaluate symptoms and progress in treatment and Medication management  Disposition: Recommend psychiatric Inpatient admission when medically cleared.  Rankin, Shuvon, NP 09/04/2016 1:00 PM  Patient seen face-to-face for psychiatric evaluation, chart reviewed and case discussed with the physician extender and developed treatment plan. Reviewed the information documented and agree with the treatment plan. Corena Pilgrim, MD

## 2016-09-05 NOTE — ED Notes (Addendum)
Assumed care of patient from [place], [place], patient resting quietly at this time. Pleasant. Hyperverbal. NO distress. Cooperative. Requested medication for right upper arm pain due to surgery Jan 9th for humeral repair. Will monitor.

## 2016-09-05 NOTE — Consult Note (Signed)
[facility] Consult   Reason for Consult:  Alcohol abuse; homicidal threats Referring Physician:  EDP Patient Identification: Jaime Phillips MRN:  [id number] Principal Diagnosis: Major psychotic depression, recurrent ([place]) Diagnosis:   Patient Active Problem List   Diagnosis Date Noted  . Humeral head fracture, right, closed, initial encounter [S42.291A] 08/08/2016  . Hyponatremia [E87.1] 08/08/2016  . Acute URI [J06.9] 08/08/2016  . Closed fracture of right proximal humerus [S42.201A]   . Cause of injury, fall [W19.XXXA]   . Chest pain [R07.9] 08/07/2016  . Alcohol-induced mood disorder ([place]) [F10.94] 11/03/2015  . Alcohol abuse [F10.10]   . Alcohol use disorder, severe, dependence ([place]) [F10.20] 10/03/2015  . Major psychotic depression, recurrent ([place]) [F33.3] 10/03/2015  . Ankle edema [M25.473] 03/17/2015  . Cirrhosis ([place]) [K74.60] 02/24/2015  . Black stools [K92.1] 02/24/2015  . Abdominal pain, epigastric [R10.13] 01/29/2015  . Nausea with vomiting [R11.2] 01/29/2015  . Ulcerative esophagitis [K22.10] 01/29/2015  . Dysphagia, pharyngoesophageal phase [R13.14] 01/29/2015  . Hypokalemia [E87.6] 12/19/2014  . Hypomagnesemia [E83.42] 12/19/2014  . Malnutrition of moderate degree ([place]) [E44.0] 12/18/2014  . Alcoholic ketoacidosis [id number] 12/17/2014  . Alcohol abuse, in remission [F10.11] 02/14/2014  . Essential hypertension, benign [I10] 02/14/2014  . Smoking [F17.200] 02/14/2014  . Upper GI bleed [K92.2] 02/06/2014  . Chronic pain syndrome [G89.4] 02/06/2014  . Gastro-esophageal reflux [K21.9] 02/06/2014  . Dental caries [K02.9] 01/17/2014  . Periprosthetic fracture of hip [id number].[place], [place] 05/31/2013  . Hip fracture ([place]) [S72.009A] 05/27/2013  . Normocytic anemia [D64.9] 04/11/2013  . S/P right THA, AA [Z96.649] 04/10/2013    Total Time spent with patient: 45 minutes  Subjective:   Jaime Phillips is a 56 y.o. female patient [facility] under  IVC related to alcohol abuse and homicidal/suicidal threat.  HPI:  Jaime Phillips 56 y.o. female patient seen by Dr. Darleene Cleaver and this provider.  Chart reviewed 09/05/16.   On evaluation:  Jaime Phillips reports that she does want to get help for her drinking problem (alcohol).  States that she has been in and out of hospital but really wants to get help this time.  Patient also states that she has had a worsening depression and suicidal thought related to her living situation.  States that she is living with her son, his girlfriend and three children.  At this time patient states that she does not feel safe going back home and wants to speak with social work about assistance.  Continues to endorse depression and passive suicidal ideation.  Denies psychosis and paranoia   09/05/16 Patient seen by Dr. Darleene Cleaver and this provider.  Chart reviewed 09/05/16.   On evaluation:  Jaime Phillips confronted about not taking her medications as ordered.  Patient states that she was just afraid of the side effects of the medication.  Discussed with patient the efficacy of mediation and if she was seeking treatment and wanted help she would take medications as ordered.  Understanding voiced and states that she would take her medications.  Patient continues to endorse depression.  Denies homicidal ideation, psychosis, and paranoia    Past Psychiatric History: Multiple admissions for detox and depression; Alcohol abuse  Risk to Self: Suicidal Ideation: No Suicidal Intent: No Is patient at risk for suicide?: No Suicidal Plan?: No Access to Means: No What has been your use of drugs/alcohol within the last 12 months?: Current use How many times?: 0 Other Self Harm Risks: na Triggers for Past Attempts:  (na) Intentional Self Injurious Behavior: None Risk to  Others: Homicidal Ideation: No Thoughts of Harm to Others: No Current Homicidal Intent: No Current Homicidal Plan: No Access to Homicidal Means:  No Identified Victim: na History of harm to others?: No Assessment of Violence: None Noted Violent Behavior Description: na Does patient have access to weapons?: No Criminal Charges Pending?: No Does patient have a court date: No Prior Inpatient Therapy: Prior Inpatient Therapy: Yes Prior Therapy Dates: 2017 Prior Therapy Facilty/Provider(s): [facility] Reason for Treatment: IVC Prior Outpatient Therapy: Prior Outpatient Therapy: No Prior Therapy Dates: na Prior Therapy Facilty/Provider(s): na Reason for Treatment: na Does patient have an ACCT team?: No Does patient have Intensive In-House Services?  : No Does patient have [facility] services? : No Does patient have P4CC services?: No  Past Medical History:  Past Medical History:  Diagnosis Date  . Abnormal uterine bleeding   . Alcoholism ([place])   . Arthritis   . Chronic pain syndrome   . Depression   . Diverticulosis   . Headache(784.0)   . Hiatal hernia   . Hyperplastic colon polyp   . Hypopotassemia   . Internal hemorrhoids   . LFT elevation   . Macrocytic anemia   . Mallory-Weiss tear   . Potassium (K) deficiency   . PUD (peptic ulcer disease)   . Rectal bleeding    minor     Past Surgical History:  Procedure Laterality Date  . APPENDECTOMY    . COLONOSCOPY    . ESOPHAGOGASTRODUODENOSCOPY N/A 02/06/2014   Procedure: ESOPHAGOGASTRODUODENOSCOPY (EGD);  Surgeon: Milus Banister, MD;  Location: [facility];  Service: Endoscopy;  Laterality: N/A;  . OOPHORECTOMY    . ORIF HUMERUS FRACTURE Right 08/10/2016   Procedure: OPEN REDUCTION INTERNAL FIXATION (ORIF) RIGHT PROXIMAL HUMERUS FRACTURE;  Surgeon: Renette Butters, MD;  Location: [facility];  Service: Orthopedics;  Laterality: Right;  . ORIF PERIPROSTHETIC FRACTURE Right 05/28/2013   Procedure: OPEN REDUCTION INTERNAL FIXATION (ORIF) PERIPROSTHETIC FRACTURE;  Surgeon: Mauri Pole, MD;  Location: [facility];  Service: Orthopedics;  Laterality: Right;  . SHOULDER SURGERY Left   .  TOTAL HIP ARTHROPLASTY Right 04/10/2013   Procedure: RIGHT TOTAL HIP ARTHROPLASTY ANTERIOR APPROACH;  Surgeon: Mauri Pole, MD;  Location: [facility];  Service: Orthopedics;  Laterality: Right;  . TOTAL HIP ARTHROPLASTY     left hip   Family History:  Family History  Problem Relation Age of Onset  . Breast cancer Sister   . Diabetes Mother   . Kidney disease Mother   . Heart disease Father   . Colon cancer Father     questionable   Family Psychiatric  History: Denies Social History:  History  Alcohol Use  . 1.2 - 1.8 oz/week  . 2 - 3 Shots of liquor per week    Comment: pt quit drinking 2 months ago (today is 03/11/15)     History  Drug Use No    Social History   Social History  . Marital status: Single    Spouse name: N/A  . Number of children: 1  . Years of education: N/A   Occupational History  . disabled    Social History Main Topics  . Smoking status: Current Every Day Smoker    Packs/day: 0.50    Years: 25.00    Types: Cigarettes  . Smokeless tobacco: Never Used     Comment: Tobacco info given to pt. 08/16/12  . Alcohol use 1.2 - 1.8 oz/week    2 - 3 Shots of liquor per week  Comment: pt quit drinking 2 months ago (today is 03/11/15)  . Drug use: No  . Sexual activity: Not Currently   Other Topics Concern  . None   Social History Narrative  . None   Additional Social History:    Allergies:   Allergies  Allergen Reactions  . Morphine And Related Nausea Only and Anxiety    Labs:  No results found for this or any previous visit (from the past 48 hour(s)).  Current Facility-Administered Medications  Medication Dose Route Frequency Provider Last Rate Last Dose  . acetaminophen (TYLENOL) tablet 650 mg  650 mg Oral Q4H PRN Lacretia Leigh, MD   650 mg at 09/03/16 1652  . alum & mag hydroxide-simeth (MAALOX/MYLANTA) 200-200-20 MG/5ML suspension 30 mL  30 mL Oral PRN Lacretia Leigh, MD      . diphenhydrAMINE (BENADRYL) capsule 25 mg  25 mg Oral [id number] PRN  Delora Fuel, MD   25 mg at [id number]  . FLUoxetine (PROZAC) capsule 10 mg  10 mg Oral Daily Corena Pilgrim, MD   10 mg at 09/04/16 1338  . gabapentin (NEURONTIN) capsule 200 mg  200 mg Oral BID Corena Pilgrim, MD   200 mg at 09/05/16 0812  . HYDROcodone-acetaminophen (NORCO/VICODIN) 5-325 MG per tablet 1 tablet  1 tablet Oral [id number] PRN Delora Fuel, MD   1 tablet at 09/05/16 [phone]  . ibuprofen (ADVIL,MOTRIN) tablet 600 mg  600 mg Oral Q8H PRN Lacretia Leigh, MD   600 mg at 09/05/16 0816  . methocarbamol (ROBAXIN) tablet 500 mg  500 mg Oral Q6H PRN Lacretia Leigh, MD   500 mg at 09/05/16 0815  . nicotine (NICODERM CQ - dosed in mg/24 hours) patch 21 mg  21 mg Transdermal Daily Lacretia Leigh, MD   21 mg at 09/05/16 0814  . ondansetron (ZOFRAN) tablet 4 mg  4 mg Oral Q8H PRN Lacretia Leigh, MD   4 mg at 09/05/16 0815  . polyethylene glycol (MIRALAX / GLYCOLAX) packet 17 g  17 g Oral Daily PRN Lacretia Leigh, MD   17 g at 09/03/16 1653   Current Outpatient Prescriptions  Medication Sig Dispense Refill  . aspirin EC 81 MG tablet Take 81 mg by mouth daily.    . ferrous sulfate 325 (65 FE) MG tablet Take 325 mg by mouth daily with breakfast.    . HYDROcodone-acetaminophen (NORCO/VICODIN) 5-325 MG tablet Take 1 tablet by mouth every 6 (six) hours as needed for moderate pain.    Marland Kitchen ibuprofen (ADVIL,MOTRIN) 200 MG tablet Take 200-400 mg by mouth every 6 (six) hours as needed for headache or mild pain.    . methocarbamol (ROBAXIN) 500 MG tablet Take 1 tablet (500 mg total) by mouth every 6 (six) hours as needed for muscle spasms. 40 tablet 0  . Multiple Vitamin (MULTIVITAMIN WITH MINERALS) TABS tablet Take 1 tablet by mouth daily.    . polyethylene glycol (MIRALAX / GLYCOLAX) packet Take 17 g by mouth daily as needed. 10 each 0  . VITAMIN E PO Take 1 tablet by mouth daily.    . ondansetron (ZOFRAN) 4 MG tablet Take 1 tablet (4 mg total) by mouth every 8 (eight) hours as needed for nausea or vomiting. (Patient  not taking: Reported on 09/03/2016) 40 tablet 0  . oxyCODONE-acetaminophen (ROXICET) 5-325 MG tablet Take 1-2 tablets by mouth every 4 (four) hours as needed for severe pain. (Patient not taking: Reported on 09/03/2016) 60 tablet 0    Musculoskeletal: Strength & Muscle Tone: within  normal limits Gait & Station: normal Patient leans: Did not see patient ambulate; cane at bedside  Psychiatric Specialty Exam: Physical Exam  Nursing note and vitals reviewed. Constitutional: She is oriented to person, place, and time.  Neck: Normal range of motion.  Respiratory: Effort normal.  Musculoskeletal: Normal range of motion.  Ambulation with cane  Neurological: She is alert and oriented to person, place, and time.    Review of Systems  Musculoskeletal: Positive for myalgias.       Ambulatory with cane  Psychiatric/Behavioral: Positive for depression, substance abuse and suicidal ideas.    Blood pressure (!) 167/104, pulse 72, temperature 98 F (36.7 C), temperature source Oral, resp. rate 18, height 5\' 3"  (1.6 m), weight 54.4 kg (120 lb), SpO2 96 %.Body mass index is 21.26 kg/m.  General Appearance: Casual  Eye Contact:  Good  Speech:  Clear and Coherent and Normal Rate  Volume:  Normal  Mood:  Depressed  Affect:  Congruent  Thought Process:  Goal Directed  Orientation:  Full (Time, Place, and Person)  Thought Content:  Rumination  Suicidal Thoughts:  Yes.  without intent/plan  Homicidal Thoughts:  No  Memory:  Immediate;   Fair Recent;   Fair Remote;   Fair  Judgement:  Fair  Insight:  Fair  Psychomotor Activity:  Normal  Concentration:  Concentration: Fair and Attention Span: Fair  Recall:  [employer] of Knowledge:  Fair  Language:  Good  Akathisia:  No  Handed:  Right  AIMS (if indicated):     Assets:  Communication Skills Desire for Improvement  ADL's:  Intact  Cognition:  WNL  Sleep:        Treatment Plan Summary: Daily contact with patient to assess and evaluate  symptoms and progress in treatment and Medication management   Will continue seeking placement for inpatient psychiatric treatment  Disposition: Recommend psychiatric Inpatient admission when medically cleared.  Rankin, Delphia Grates, NP 09/05/2016 11:33 AM  Patient seen face-to-face for psychiatric evaluation, chart reviewed and case discussed with the physician extender and developed treatment plan. Reviewed the information documented and agree with the treatment plan. Corena Pilgrim, MD

## 2016-09-05 NOTE — Progress Notes (Signed)
Patient has been referred to the following geriatric inpatient treatment facilities: [place], [place], [place], [place], [facility], [place], [place], [place] Disposition staff 09/05/2016 4:45 PM .

## 2016-09-06 MED ORDER — GABAPENTIN 100 MG PO CAPS: 200.0000 mg | ORAL_CAPSULE | Freq: Two times a day (BID) | ORAL | 0 refills | Status: DC

## 2016-09-06 MED ORDER — FLUOXETINE HCL 10 MG PO CAPS: 20.0000 mg | ORAL_CAPSULE | Freq: Every day | ORAL | 0 refills | Status: DC

## 2016-09-06 NOTE — BH Assessment (Signed)
[place] Assessment Progress Note  Per Mojeed Akintayo, this pt does not require psychiatric hospitalization at this time.  Pt presents under IVC initiated by a friend, which Dr Darleene Cleaver has rescinded.  Pt has been psychiatrically cleared and is to be referred to Social Work for follow up.  At 10:09 this Probation officer called the on-duty social worker and spoke to [place], informing her of disposition.  Referral information for area substance abuse treatment providers, including [facility], [facility], and [facility] has been included in pt's discharge instructions.  Pt's nurse has been notified.  Jalene Mullet, [place] Triage Specialist [phone]

## 2016-09-06 NOTE — Progress Notes (Addendum)
CSW spoke to disposition and was informed patient was psychiatrically cleared, per the notes. CSW spoke with the ED EDP and asked that the pt be evaluated for discharge.  Pt was medically discharged by the ED EDP on duty on Friday 09/03/16, per the notes.  CSW spoke with the patient and obtained the pt's destination at discharge where the pt would be picking up belongings, before going to [facility] to seek admission at discharge.  CSW will provide the patient with a taxi voucher.  2:30 PM CSW provided the pt with a taxi voucher.  Please reconsult if future social work needs arise.   CSW signing off.   Jaime Phillips. Jaime Phillips, Jaime Phillips, LCAS Clinical Social Worker Ph: [phone]

## 2016-09-06 NOTE — ED Notes (Signed)
Pt in shower , will be discharged when finished.
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
## 2017-02-14 NOTE — Addendum Note (Signed)
Addendum  created 02/14/17 1702 by Annye Asa, MD   Delete clinical note, Sign clinical note

## 2017-02-14 NOTE — Addendum Note (Signed)
Addendum  created 02/14/17 1449 by Annye Asa, MD   Sign clinical note

## 2017-02-14 NOTE — Anesthesia Postprocedure Evaluation (Deleted)
Anesthesia Post Note  Patient: Jaime Phillips  Procedure(s) Performed: Procedure(s) (LRB): OPEN REDUCTION INTERNAL FIXATION (ORIF) RIGHT PROXIMAL HUMERUS FRACTURE (Right)     Anesthesia Post Evaluation  Last Vitals:  Vitals:   08/12/16 0615 08/12/16 1400  BP: (!) 108/51 110/62  Pulse: 76 82  Resp:    Temp: 36.7 C     Last Pain:  Vitals:   08/12/16 1242  TempSrc:   PainSc: 2                  Burnadette Baskett,E. Shanessa Hodak

## 2017-02-25 ENCOUNTER — Ambulatory Visit (HOSPITAL_COMMUNITY)
Admission: EM | Admit: 2017-02-25 | Discharge: 2017-02-25 | Disposition: A | Discharge status: Home / Self Care | Payer: Medicaid Other | Attending: Internal Medicine | Admitting: Internal Medicine

## 2017-02-25 ENCOUNTER — Encounter (HOSPITAL_COMMUNITY): Payer: Self-pay

## 2017-02-25 DIAGNOSIS — N760 Acute vaginitis: Secondary | ICD-10-CM | POA: Diagnosis not present

## 2017-02-25 DIAGNOSIS — N898 Other specified noninflammatory disorders of vagina: Secondary | ICD-10-CM | POA: Diagnosis not present

## 2017-02-25 DIAGNOSIS — F17210 Nicotine dependence, cigarettes, uncomplicated: Secondary | ICD-10-CM | POA: Diagnosis not present

## 2017-02-25 DIAGNOSIS — Z7982 Long term (current) use of aspirin: Secondary | ICD-10-CM | POA: Insufficient documentation

## 2017-02-25 DIAGNOSIS — Z79899 Other long term (current) drug therapy: Secondary | ICD-10-CM | POA: Diagnosis not present

## 2017-02-25 DIAGNOSIS — B9689 Other specified bacterial agents as the cause of diseases classified elsewhere: Secondary | ICD-10-CM | POA: Insufficient documentation

## 2017-02-25 DIAGNOSIS — Z711 Person with feared health complaint in whom no diagnosis is made: Secondary | ICD-10-CM

## 2017-02-25 DIAGNOSIS — Z113 Encounter for screening for infections with a predominantly sexual mode of transmission: Secondary | ICD-10-CM | POA: Insufficient documentation

## 2017-02-25 MED ORDER — CEFTRIAXONE SODIUM 250 MG IJ SOLR
250.0000 mg | Freq: Once | INTRAMUSCULAR | Status: AC
  Administered 2017-02-25: 250 mg via INTRAMUSCULAR

## 2017-02-25 MED ORDER — AZITHROMYCIN 250 MG PO TABS
1000.0000 mg | ORAL_TABLET | Freq: Once | ORAL | Status: AC
  Administered 2017-02-25: 1000 mg via ORAL

## 2017-02-25 MED ORDER — AZITHROMYCIN 250 MG PO TABS
ORAL_TABLET | ORAL | Status: AC
  Filled 2017-02-25: qty 4

## 2017-02-25 MED ORDER — CEFTRIAXONE SODIUM 250 MG IJ SOLR
INTRAMUSCULAR | Status: AC
Start: 2017-02-25 — End: 2017-02-25
  Filled 2017-02-25: qty 250

## 2017-02-25 MED ORDER — METRONIDAZOLE 500 MG PO TABS
500.0000 mg | ORAL_TABLET | Freq: Two times a day (BID) | ORAL | 0 refills | Status: DC
Start: 2017-02-25 — End: 2017-07-29

## 2017-02-25 MED ORDER — CLOTRIMAZOLE-BETAMETHASONE 1-0.05 % EX CREA
TOPICAL_CREAM | CUTANEOUS | 1 refills | Status: DC
Start: 2017-02-25 — End: 2017-07-29

## 2017-02-25 NOTE — ED Provider Notes (Signed)
CSN: [id number]     Arrival date & time 02/25/17  1540 History   First MD Initiated Contact with Patient 02/25/17 1612     Chief Complaint  Patient presents with  . Vaginal Discharge   (Consider location/radiation/quality/duration/timing/severity/associated sxs/prior Treatment) Patient c/o yellow foul smelling vaginal discharge.  Patient is not sure if she has an std and is concerned.  She denies any pelvic pain.   The history is provided by the patient.  Vaginal Discharge  Quality:  Yellow Severity:  Moderate Onset quality:  Sudden Duration:  3 days Timing:  Constant Progression:  Worsening Chronicity:  New Relieved by:  Nothing Worsened by:  Nothing Ineffective treatments:  None tried   Past Medical History:  Diagnosis Date  . Abnormal uterine bleeding   . Alcoholism ([place])   . Arthritis   . Chronic pain syndrome   . Depression   . Diverticulosis   . Headache(784.0)   . Hiatal hernia   . Hyperplastic colon polyp   . Hypopotassemia   . Internal hemorrhoids   . LFT elevation   . Macrocytic anemia   . Mallory-Weiss tear   . Potassium (K) deficiency   . PUD (peptic ulcer disease)   . Rectal bleeding    minor    Past Surgical History:  Procedure Laterality Date  . APPENDECTOMY    . COLONOSCOPY    . ESOPHAGOGASTRODUODENOSCOPY N/A 02/06/2014   Procedure: ESOPHAGOGASTRODUODENOSCOPY (EGD);  Surgeon: Milus Banister, MD;  Location: [facility];  Service: Endoscopy;  Laterality: N/A;  . OOPHORECTOMY    . ORIF HUMERUS FRACTURE Right 08/10/2016   Procedure: OPEN REDUCTION INTERNAL FIXATION (ORIF) RIGHT PROXIMAL HUMERUS FRACTURE;  Surgeon: Renette Butters, MD;  Location: [facility];  Service: Orthopedics;  Laterality: Right;  . ORIF PERIPROSTHETIC FRACTURE Right 05/28/2013   Procedure: OPEN REDUCTION INTERNAL FIXATION (ORIF) PERIPROSTHETIC FRACTURE;  Surgeon: Mauri Pole, MD;  Location: [facility];  Service: Orthopedics;  Laterality: Right;  . SHOULDER SURGERY Left   . TOTAL HIP  ARTHROPLASTY Right 04/10/2013   Procedure: RIGHT TOTAL HIP ARTHROPLASTY ANTERIOR APPROACH;  Surgeon: Mauri Pole, MD;  Location: [facility];  Service: Orthopedics;  Laterality: Right;  . TOTAL HIP ARTHROPLASTY     left hip   Family History  Problem Relation Age of Onset  . Breast cancer Sister   . Diabetes Mother   . Kidney disease Mother   . Heart disease Father   . Colon cancer Father        questionable   Social History  Substance Use Topics  . Smoking status: Current Every Day Smoker    Packs/day: 0.50    Years: 25.00    Types: Cigarettes  . Smokeless tobacco: Never Used     Comment: Tobacco info given to pt. 08/16/12  . Alcohol use 1.2 - 1.8 oz/week    2 - 3 Shots of liquor per week     Comment: pt quit drinking 2 months ago (today is 03/11/15)   OB History    Gravida Para Term Preterm AB Living   2 1 1   1 1    SAB TAB Ectopic Multiple Live Births   1             Review of Systems  Constitutional: Negative.   HENT: Negative.   Eyes: Negative.   Respiratory: Negative.   Cardiovascular: Negative.   Gastrointestinal: Negative.   Endocrine: Negative.   Genitourinary: Positive for vaginal discharge.  Musculoskeletal: Negative.   Allergic/Immunologic:  Negative.   Neurological: Negative.   Hematological: Negative.   Psychiatric/Behavioral: Negative.     Allergies  Morphine and related  Home Medications   Prior to Admission medications   Medication Sig Start Date End Date Taking? Authorizing Provider  aspirin EC 81 MG tablet Take 81 mg by mouth daily.    [provider]  clotrimazole-betamethasone (LOTRISONE) cream Apply to affected area 2 times daily prn 02/25/17   Lysbeth Penner, FNP  ferrous sulfate 325 (65 FE) MG tablet Take 325 mg by mouth daily with breakfast.    [provider]  FLUoxetine (PROZAC) 10 MG capsule Take 2 capsules (20 mg total) by mouth daily. 09/07/16   Patrecia Pour, NP  gabapentin (NEURONTIN) 100 MG capsule Take 2 capsules  (200 mg total) by mouth 2 (two) times daily. 09/06/16   Patrecia Pour, NP  HYDROcodone-acetaminophen (NORCO/VICODIN) 5-325 MG tablet Take 1 tablet by mouth every 6 (six) hours as needed for moderate pain.    [provider]  ibuprofen (ADVIL,MOTRIN) 200 MG tablet Take 200-400 mg by mouth every 6 (six) hours as needed for headache or mild pain.    [provider]  methocarbamol (ROBAXIN) 500 MG tablet Take 1 tablet (500 mg total) by mouth every 6 (six) hours as needed for muscle spasms. 08/10/16   Prudencio Burly III, PA-C  metroNIDAZOLE (FLAGYL) 500 MG tablet Take 1 tablet (500 mg total) by mouth 2 (two) times daily. 02/25/17   Lysbeth Penner, FNP  Multiple Vitamin (MULTIVITAMIN WITH MINERALS) TABS tablet Take 1 tablet by mouth daily.    [provider]  ondansetron (ZOFRAN) 4 MG tablet Take 1 tablet (4 mg total) by mouth every 8 (eight) hours as needed for nausea or vomiting. 08/10/16   Martensen, Charna Elizabeth III, PA-C  oxyCODONE-acetaminophen (ROXICET) 5-325 MG tablet Take 1-2 tablets by mouth every 4 (four) hours as needed for severe pain. 08/10/16   Martensen, Charna Elizabeth III, PA-C  polyethylene glycol (MIRALAX / GLYCOLAX) packet Take 17 g by mouth daily as needed. 08/12/16   Domenic Polite, MD  VITAMIN E PO Take 1 tablet by mouth daily.    [provider]   Meds Ordered and Administered this Visit   Medications  cefTRIAXone (ROCEPHIN) injection 250 mg (250 mg Intramuscular Given 02/25/17 1633)  azithromycin (ZITHROMAX) tablet 1,000 mg (1,000 mg Oral Given 02/25/17 1635)    BP (!) 156/86 (BP Location: Left Arm)   Pulse 96   Temp 98.5 F (36.9 C) (Oral)   Resp 18   SpO2 98%  No data found.   Physical Exam  Constitutional: She appears well-developed and well-nourished.  HENT:  Head: Normocephalic and atraumatic.  Eyes: Pupils are equal, round, and reactive to light. Conjunctivae and EOM are normal.  Neck: Normal range of motion. Neck supple.   Cardiovascular: Normal rate, regular rhythm and normal heart sounds.   Pulmonary/Chest: Effort normal and breath sounds normal.  Abdominal: Soft. Bowel sounds are normal.  Nursing note and vitals reviewed.   Urgent Care Course     Procedures (including critical care time)  Labs Review Labs Reviewed  CERVICOVAGINAL ANCILLARY ONLY    Imaging Review No results found.   Visual Acuity Review  Right Eye Distance:   Left Eye Distance:   Bilateral Distance:    Right Eye Near:   Left Eye Near:    Bilateral Near:         MDM   1. Vaginal discharge  2. Bacterial vaginosis   3. Concern about STD in female without diagnosis    Rocephin 250mg  IM Azithromycin 250mg  x 4 Flagyl 500mg  one po bid x 7 days #14  Vaginal cytology - GC/chlamydia Trich cvag bvag      Lysbeth Penner, FNP 02/25/17 1644

## 2017-02-25 NOTE — ED Triage Notes (Signed)
Pt has a yellow discharge and it has a foul odor said she wasn't sure if it was a chance for std. Also said she has a spot on her right leg that has a rash and left hip. Unsure what bit her. Will self collect a swab.

## 2017-02-28 LAB — CERVICOVAGINAL ANCILLARY ONLY
Bacterial vaginitis: POSITIVE — AB
Candida vaginitis: POSITIVE — AB
Chlamydia: NEGATIVE
Neisseria Gonorrhea: NEGATIVE
Trichomonas: POSITIVE — AB

## 2017-03-27 IMAGING — CR DG CHEST 2V
2 series · 2 of 2 positions shown · non-contrast
Comparison: None.

CLINICAL DATA: Sharp chest pains.

EXAM:
CHEST  2 VIEW

[w chest pa]
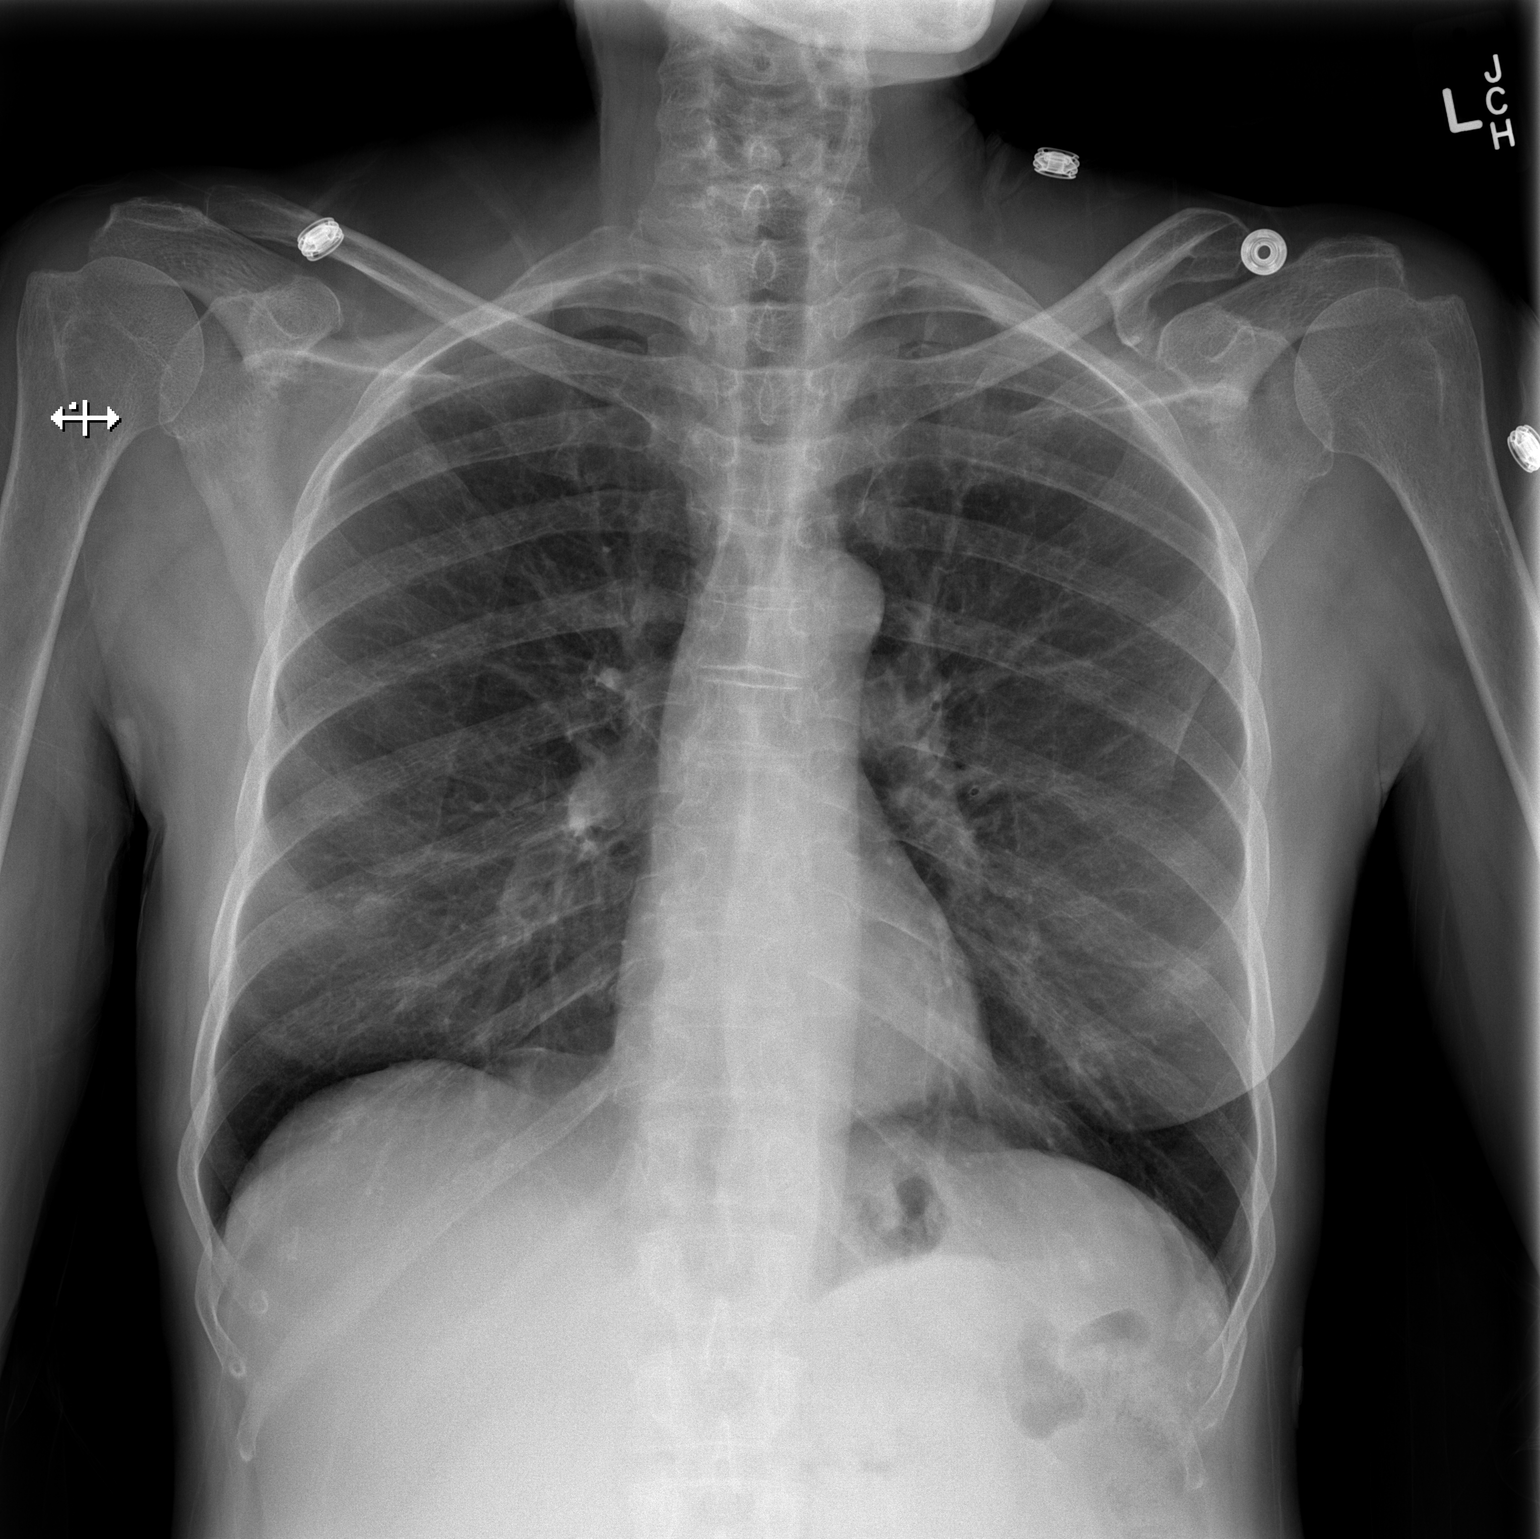

[w chest lat]
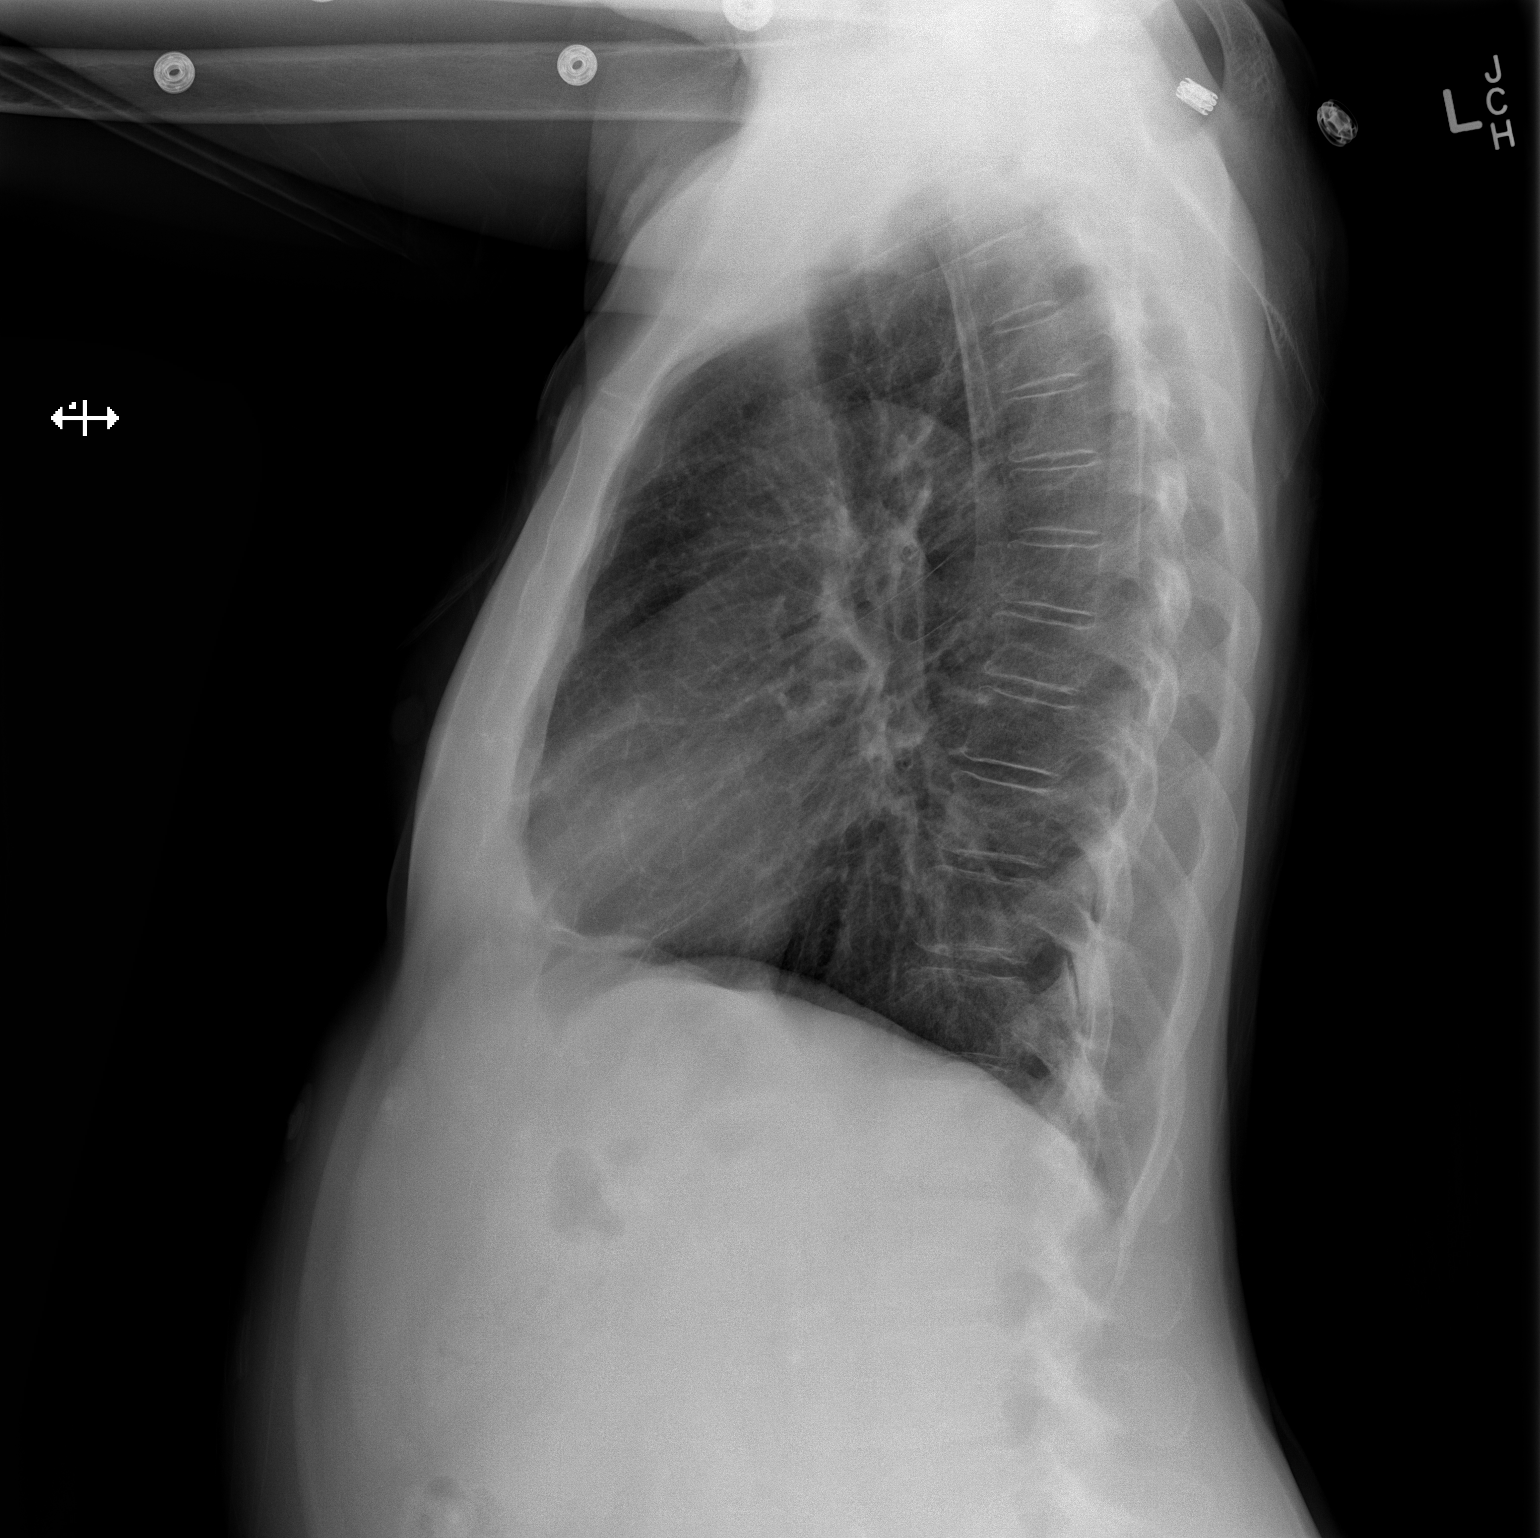

[2 of 2 positions shown; findings below may reference images not displayed]

FINDINGS: Mediastinum and hilar structures normal. Mild right base
subsegmental atelectasis. Bilateral nipple shadows. Heart size
normal. No pleural effusion or pneumothorax. Bony structures are
stable. Stable deformity distal left clavicle.
IMPRESSION: Mild right base subsegmental atelectasis. Exam otherwise
unremarkable.

## 2017-07-28 ENCOUNTER — Encounter (HOSPITAL_COMMUNITY): Payer: Self-pay

## 2017-07-28 ENCOUNTER — Encounter (HOSPITAL_COMMUNITY): Payer: Self-pay | Admitting: *Deleted

## 2017-07-28 ENCOUNTER — Emergency Department (HOSPITAL_COMMUNITY)
Admission: EM | Admit: 2017-07-28 | Discharge: 2017-07-28 | Disposition: A | Discharge status: Home / Self Care | Payer: Medicaid Other | Attending: Emergency Medicine | Admitting: Emergency Medicine

## 2017-07-28 ENCOUNTER — Other Ambulatory Visit: Payer: Self-pay

## 2017-07-28 DIAGNOSIS — Z96643 Presence of artificial hip joint, bilateral: Secondary | ICD-10-CM | POA: Insufficient documentation

## 2017-07-28 DIAGNOSIS — E876 Hypokalemia: Secondary | ICD-10-CM | POA: Insufficient documentation

## 2017-07-28 DIAGNOSIS — Z5321 Procedure and treatment not carried out due to patient leaving prior to being seen by health care provider: Secondary | ICD-10-CM | POA: Diagnosis not present

## 2017-07-28 DIAGNOSIS — J069 Acute upper respiratory infection, unspecified: Secondary | ICD-10-CM | POA: Insufficient documentation

## 2017-07-28 DIAGNOSIS — R1011 Right upper quadrant pain: Secondary | ICD-10-CM | POA: Diagnosis present

## 2017-07-28 DIAGNOSIS — Z79899 Other long term (current) drug therapy: Secondary | ICD-10-CM | POA: Diagnosis not present

## 2017-07-28 DIAGNOSIS — R05 Cough: Secondary | ICD-10-CM | POA: Diagnosis not present

## 2017-07-28 DIAGNOSIS — Z7982 Long term (current) use of aspirin: Secondary | ICD-10-CM | POA: Insufficient documentation

## 2017-07-28 DIAGNOSIS — K0889 Other specified disorders of teeth and supporting structures: Secondary | ICD-10-CM | POA: Insufficient documentation

## 2017-07-28 DIAGNOSIS — R0981 Nasal congestion: Secondary | ICD-10-CM | POA: Insufficient documentation

## 2017-07-28 DIAGNOSIS — F17210 Nicotine dependence, cigarettes, uncomplicated: Secondary | ICD-10-CM | POA: Insufficient documentation

## 2017-07-28 DIAGNOSIS — R109 Unspecified abdominal pain: Secondary | ICD-10-CM | POA: Insufficient documentation

## 2017-07-28 DIAGNOSIS — I10 Essential (primary) hypertension: Secondary | ICD-10-CM | POA: Insufficient documentation

## 2017-07-28 DIAGNOSIS — B9789 Other viral agents as the cause of diseases classified elsewhere: Secondary | ICD-10-CM | POA: Insufficient documentation

## 2017-07-28 DIAGNOSIS — K8020 Calculus of gallbladder without cholecystitis without obstruction: Secondary | ICD-10-CM | POA: Diagnosis not present

## 2017-07-28 LAB — COMPREHENSIVE METABOLIC PANEL
ALBUMIN: 4.4 g/dL (ref 3.5–5.0)
ALT: 65 U/L — ABNORMAL HIGH (ref 14–54)
ANION GAP: 10 (ref 5–15)
AST: 168 U/L — ABNORMAL HIGH (ref 15–41)
Alkaline Phosphatase: 106 U/L (ref 38–126)
BILIRUBIN TOTAL: 1 mg/dL (ref 0.3–1.2)
BUN: 10 mg/dL (ref 6–20)
CO2: 22 mmol/L (ref 22–32)
Calcium: 9.4 mg/dL (ref 8.9–10.3)
Chloride: 107 mmol/L (ref 101–111)
Creatinine, Ser: 0.61 mg/dL (ref 0.44–1.00)
Glucose, Bld: 108 mg/dL — ABNORMAL HIGH (ref 65–99)
POTASSIUM: 4.4 mmol/L (ref 3.5–5.1)
Sodium: 139 mmol/L (ref 135–145)
TOTAL PROTEIN: 8 g/dL (ref 6.5–8.1)

## 2017-07-28 LAB — URINALYSIS, ROUTINE W REFLEX MICROSCOPIC
BILIRUBIN URINE: NEGATIVE
Glucose, UA: NEGATIVE mg/dL
HGB URINE DIPSTICK: NEGATIVE
Ketones, ur: 80 mg/dL — AB
NITRITE: NEGATIVE
PROTEIN: 30 mg/dL — AB
Specific Gravity, Urine: 1.03 (ref 1.005–1.030)
pH: 5 (ref 5.0–8.0)

## 2017-07-28 LAB — CBC
HEMATOCRIT: 43.9 % (ref 36.0–46.0)
HEMOGLOBIN: 14.7 g/dL (ref 12.0–15.0)
MCH: 34.2 pg — ABNORMAL HIGH (ref 26.0–34.0)
MCHC: 33.5 g/dL (ref 30.0–36.0)
MCV: 102.1 fL — ABNORMAL HIGH (ref 78.0–100.0)
Platelets: 224 10*3/uL (ref 150–400)
RBC: 4.3 MIL/uL (ref 3.87–5.11)
RDW: 13.1 % (ref 11.5–15.5)
WBC: 10.4 10*3/uL (ref 4.0–10.5)

## 2017-07-28 LAB — LIPASE, BLOOD: Lipase: 25 U/L (ref 11–51)

## 2017-07-28 NOTE — ED Triage Notes (Signed)
Pt returns with multiple complaints, abd pain, tooth ache, muscle spasms, she is aware we treat the emergent problems, she can not decide if any are emergent. Pt was triaged earlier then left. States she is homeless and states she will be here until the morning.

## 2017-07-28 NOTE — ED Triage Notes (Signed)
Patient c/o muscle spasms in BLE x 3 weeks. Patient c/o mid abdominal pain x 1 month and diarrhea x 3 days. patient also c/o gum pain right upper which is causing her to not eat.

## 2017-07-28 NOTE — ED Triage Notes (Signed)
Patient states she has a rod in right arm and prefers not too have any Blood pressure taken on that right arm.

## 2017-07-29 ENCOUNTER — Emergency Department (HOSPITAL_COMMUNITY): Payer: Medicaid Other

## 2017-07-29 ENCOUNTER — Emergency Department (HOSPITAL_COMMUNITY)
Admission: EM | Admit: 2017-07-29 | Discharge: 2017-07-29 | Disposition: A | Discharge status: Home / Self Care | Payer: Medicaid Other | Attending: Emergency Medicine | Admitting: Emergency Medicine

## 2017-07-29 DIAGNOSIS — K1379 Other lesions of oral mucosa: Secondary | ICD-10-CM

## 2017-07-29 DIAGNOSIS — E876 Hypokalemia: Secondary | ICD-10-CM

## 2017-07-29 DIAGNOSIS — B9789 Other viral agents as the cause of diseases classified elsewhere: Secondary | ICD-10-CM

## 2017-07-29 DIAGNOSIS — J069 Acute upper respiratory infection, unspecified: Secondary | ICD-10-CM

## 2017-07-29 DIAGNOSIS — R1011 Right upper quadrant pain: Secondary | ICD-10-CM

## 2017-07-29 DIAGNOSIS — K8020 Calculus of gallbladder without cholecystitis without obstruction: Secondary | ICD-10-CM

## 2017-07-29 LAB — CBC WITH DIFFERENTIAL/PLATELET
BASOS ABS: 0 10*3/uL (ref 0.0–0.1)
Basophils Relative: 0 %
EOS ABS: 0.2 10*3/uL (ref 0.0–0.7)
EOS PCT: 2 %
HCT: 38.7 % (ref 36.0–46.0)
Hemoglobin: 13.1 g/dL (ref 12.0–15.0)
LYMPHS ABS: 3.1 10*3/uL (ref 0.7–4.0)
Lymphocytes Relative: 28 %
MCH: 34.4 pg — AB (ref 26.0–34.0)
MCHC: 33.9 g/dL (ref 30.0–36.0)
MCV: 101.6 fL — ABNORMAL HIGH (ref 78.0–100.0)
MONO ABS: 0.4 10*3/uL (ref 0.1–1.0)
Monocytes Relative: 4 %
Neutro Abs: 7.5 10*3/uL (ref 1.7–7.7)
Neutrophils Relative %: 66 %
PLATELETS: 190 10*3/uL (ref 150–400)
RBC: 3.81 MIL/uL — AB (ref 3.87–5.11)
RDW: 13.2 % (ref 11.5–15.5)
WBC: 11.2 10*3/uL — AB (ref 4.0–10.5)

## 2017-07-29 LAB — COMPREHENSIVE METABOLIC PANEL
ALT: 52 U/L (ref 14–54)
AST: 100 U/L — ABNORMAL HIGH (ref 15–41)
Albumin: 3.8 g/dL (ref 3.5–5.0)
Alkaline Phosphatase: 93 U/L (ref 38–126)
Anion gap: 9 (ref 5–15)
BUN: 12 mg/dL (ref 6–20)
CHLORIDE: 104 mmol/L (ref 101–111)
CO2: 23 mmol/L (ref 22–32)
CREATININE: 0.61 mg/dL (ref 0.44–1.00)
Calcium: 9.2 mg/dL (ref 8.9–10.3)
GFR calc non Af Amer: >60 mL/min (ref >60)
Glucose, Bld: 104 mg/dL — ABNORMAL HIGH (ref 65–99)
POTASSIUM: 3.3 mmol/L — AB (ref 3.5–5.1)
SODIUM: 136 mmol/L (ref 135–145)
Total Bilirubin: 1 mg/dL (ref 0.3–1.2)
Total Protein: 7 g/dL (ref 6.5–8.1)

## 2017-07-29 LAB — LIPASE, BLOOD: LIPASE: 28 U/L (ref 11–51)

## 2017-07-29 MED ORDER — PANTOPRAZOLE SODIUM 40 MG IV SOLR
40.0000 mg | Freq: Once | INTRAVENOUS | Status: AC
  Administered 2017-07-29: 40 mg via INTRAVENOUS
  Filled 2017-07-29: qty 40

## 2017-07-29 MED ORDER — POTASSIUM CHLORIDE CRYS ER 20 MEQ PO TBCR
40.0000 meq | EXTENDED_RELEASE_TABLET | Freq: Once | ORAL | Status: AC
  Administered 2017-07-29: 40 meq via ORAL
  Filled 2017-07-29: qty 2

## 2017-07-29 MED ORDER — PANTOPRAZOLE SODIUM 20 MG PO TBEC: 20.0000 mg | DELAYED_RELEASE_TABLET | Freq: Every day | ORAL | 0 refills | Status: DC

## 2017-07-29 MED ORDER — SODIUM CHLORIDE 0.9 % IV BOLUS (SEPSIS)
1000.0000 mL | Freq: Once | INTRAVENOUS | Status: AC
  Administered 2017-07-29: 1000 mL via INTRAVENOUS

## 2017-07-29 MED ORDER — ALBUTEROL SULFATE HFA 108 (90 BASE) MCG/ACT IN AERS: 2.0000 | INHALATION_SPRAY | RESPIRATORY_TRACT | 3 refills | Status: DC | PRN

## 2017-07-29 MED ORDER — AEROCHAMBER PLUS W/MASK MISC: 2 refills | Status: DC

## 2017-07-29 MED ORDER — SODIUM CHLORIDE 0.9 % IV BOLUS (SEPSIS)
500.0000 mL | Freq: Once | INTRAVENOUS | Status: AC
  Administered 2017-07-29: 500 mL via INTRAVENOUS

## 2017-07-29 NOTE — Discharge Instructions (Signed)
1. Medications: albuterol, Protonix, usual home medications 2. Treatment: rest, drink plenty of fluids, take tylenol or ibuprofen for fever control 3. Follow Up: Please followup with your primary doctor in 3 days for discussion of your diagnoses and further evaluation after today's visit; if you do not have a primary care doctor use the resource guide provided to find one; Return to the ER for high fevers, difficulty breathing, worsening abdominal pain, persistent vomiting, bloody emesis or other concerning symptoms

## 2017-07-29 NOTE — ED Provider Notes (Signed)
[facility] DEPT Provider Note   CSN: [id number] Arrival date & time: 07/28/17  2135     History   Chief Complaint Chief Complaint  Patient presents with  . multiple complaints non specific    HPI Jaime Phillips is a 56 y.o. female with a hx of alcoholism, chronic pain, Mallory-Weiss tear, anemia, optic ulcer disease presents to the Emergency Department complaining of gradual, persistent, progressively worsening right upper dental pain onset several days ago.  Pt reports she had a root canal approx 6 mos ago.  She denies fever, chills, neck pain, sore throat, difficulty eating.  Pt reports eating does make the pain worse.    Pt reports she believes she has the flu.  She reports cough, nasal congestion and muscle aches for the last 2 weeks.  She denies fever, chills, neck pain, abd pain, nausea and vomiting. She reports fatigue as well.  Pt reports she is a smoker reporting 5 cigarettes per day.  She states she is wheezing.  Pt denies sick contacts.    Pt also reports RUQ abd pain.  She has a history of alcoholic cirrhosis.  She reports she has been told to stop drinking however has had some alcohol this week.  Patient reports that pain is sharp in nature and worse when she bends over.  She reports that lying flat improves her pain some.  She denies nausea or vomiting, hematemesis or melena.  The history is provided by the patient and medical records. No language interpreter was used.    Past Medical History:  Diagnosis Date  . Abnormal uterine bleeding   . Alcoholism ([place])   . Arthritis   . Chronic pain syndrome   . Depression   . Diverticulosis   . Headache(784.0)   . Hiatal hernia   . Hyperplastic colon polyp   . Hypopotassemia   . Internal hemorrhoids   . LFT elevation   . Macrocytic anemia   . Mallory-Weiss tear   . Potassium (K) deficiency   . PUD (peptic ulcer disease)   . Rectal bleeding    minor     Patient Active Problem List   Diagnosis Date Noted  . Humeral head fracture, right, closed, initial encounter 08/08/2016  . Hyponatremia 08/08/2016  . Acute URI 08/08/2016  . Closed fracture of right proximal humerus   . Cause of injury, fall   . Chest pain 08/07/2016  . 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 Alcohol abuse   . Alcohol use disorder, severe, dependence ([place]) 10/03/2015  . Major psychotic depression, recurrent ([place]) 10/03/2015  . Ankle edema 03/17/2015  . Cirrhosis ([place]) 02/24/2015  . Black stools 02/24/2015  . Abdominal pain, epigastric 01/29/2015  . Nausea with vomiting 01/29/2015  . 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 Dysphagia, pharyngoesophageal phase 01/29/2015  . Hypokalemia 12/19/2014  . Hypomagnesemia 12/19/2014  . Malnutrition of moderate degree ([place]) 12/18/2014  . Alcoholic ketoacidosis 28/78/6767  . Alcohol abuse, in remission 02/14/2014  . Essential hypertension, benign 02/14/2014  . Smoking 02/14/2014  . Upper GI bleed 02/06/2014  . Chronic pain syndrome 02/06/2014  . Gastro-esophageal reflux 02/06/2014  . Dental caries 01/17/2014  . Periprosthetic fracture of hip 05/31/2013  . Hip fracture ([place]) 05/27/2013  . Normocytic anemia 04/11/2013  . S/P right THA, AA 04/10/2013    Past Surgical History:  Procedure Laterality Date  . APPENDECTOMY    . COLONOSCOPY    . ESOPHAGOGASTRODUODENOSCOPY N/A 02/06/2014   Procedure: ESOPHAGOGASTRODUODENOSCOPY (EGD);  Surgeon: Melene Plan  Ardis Hughs, MD;  Location: [facility];  Service: Endoscopy;  Laterality: N/A;  . OOPHORECTOMY    . ORIF HUMERUS FRACTURE Right 08/10/2016   Procedure: OPEN REDUCTION INTERNAL FIXATION (ORIF) RIGHT PROXIMAL HUMERUS FRACTURE;  Surgeon: Renette Butters, MD;  Location: [facility];  Service: Orthopedics;  Laterality: Right;  . ORIF PERIPROSTHETIC FRACTURE Right 05/28/2013   Procedure: OPEN REDUCTION INTERNAL FIXATION (ORIF) PERIPROSTHETIC FRACTURE;  Surgeon: Mauri Pole, MD;  Location: [facility];  Service:  Orthopedics;  Laterality: Right;  . SHOULDER SURGERY Left   . TOTAL HIP ARTHROPLASTY Right 04/10/2013   Procedure: RIGHT TOTAL HIP ARTHROPLASTY ANTERIOR APPROACH;  Surgeon: Mauri Pole, MD;  Location: [facility];  Service: Orthopedics;  Laterality: Right;  . TOTAL HIP ARTHROPLASTY     left hip    OB History    Gravida Para Term Preterm AB Living   2 1 1   1 1    SAB TAB Ectopic Multiple Live Births   1               Home Medications    Prior to Admission medications   Medication Sig Start Date End Date Taking? Authorizing Provider  aspirin EC 81 MG tablet Take 81 mg by mouth daily.   Yes [provider]  ibuprofen (ADVIL,MOTRIN) 200 MG tablet Take 200-400 mg by mouth every 6 (six) hours as needed for headache or mild pain.   Yes [provider]  Multiple Vitamin (MULTIVITAMIN WITH MINERALS) TABS tablet Take 1 tablet by mouth daily.   Yes [provider]  albuterol (PROVENTIL HFA;VENTOLIN HFA) 108 (90 Base) MCG/ACT inhaler Inhale 2 puffs into the lungs every 4 (four) hours as needed for wheezing or shortness of breath. 07/29/17   Izabelle Daus, Jarrett Soho, PA-C  pantoprazole (PROTONIX) 20 MG tablet Take 1 tablet (20 mg total) by mouth daily. 07/29/17   Eddison Searls, Jarrett Soho, PA-C  Spacer/Aero-Holding Chambers (AEROCHAMBER PLUS WITH MASK) inhaler Use as instructed 07/29/17   Sumaiyah Markert, Jarrett Soho, PA-C    Family History Family History  Problem Relation Age of Onset  . Breast cancer Sister   . Diabetes Mother   . Kidney disease Mother   . Heart disease Father   . Colon cancer Father        questionable    Social History Social History   Tobacco Use  . Smoking status: Current Every Day Smoker    Packs/day: 0.50    Years: 25.00    Pack years: 12.50    Types: Cigarettes  . Smokeless tobacco: Never Used  . Tobacco comment: Tobacco info given to pt. 08/16/12  Substance Use Topics  . Alcohol use: Yes    Alcohol/week: 1.2 - 1.8 oz    Types: 2 - 3 Shots of  liquor per week    Comment: pt quit drinking 2 months ago (today is 03/11/15)  . Drug use: No     Allergies   Morphine and related   Review of Systems Review of Systems  Constitutional: Negative for appetite change, diaphoresis, fatigue, fever and unexpected weight change.  HENT: Positive for congestion, dental problem, rhinorrhea and sore throat. Negative for mouth sores.   Eyes: Negative for visual disturbance.  Respiratory: Positive for cough. Negative for chest tightness, shortness of breath and wheezing.   Cardiovascular: Negative for chest pain.  Gastrointestinal: Positive for abdominal pain. Negative for constipation, diarrhea, nausea and vomiting.  Endocrine: Negative for polydipsia, polyphagia and polyuria.  Genitourinary: Negative for dysuria, frequency, hematuria and urgency.  Musculoskeletal:  Positive for myalgias. Negative for back pain and neck stiffness.  Skin: Negative for rash.  Allergic/Immunologic: Negative for immunocompromised state.  Neurological: Negative for syncope, light-headedness and headaches.  Hematological: Does not bruise/bleed easily.  Psychiatric/Behavioral: Negative for sleep disturbance. The patient is not nervous/anxious.      Physical Exam Updated Vital Signs BP (!) 131/91   Pulse (!) 103   Temp 98.9 F (37.2 C) (Oral)   Resp 18   Ht 5\' 3"  (1.6 m)   Wt 54.4 kg (120 lb)   SpO2 100%   BMI 21.26 kg/m   Physical Exam  Constitutional: She appears well-developed and well-nourished. No distress.  Awake, alert, nontoxic appearance  HENT:  Head: Normocephalic and atraumatic.  Right Ear: Tympanic membrane, external ear and ear canal normal.  Left Ear: Tympanic membrane, external ear and ear canal normal.  Nose: Mucosal edema and rhinorrhea present. No epistaxis. Right sinus exhibits no maxillary sinus tenderness and no frontal sinus tenderness. Left sinus exhibits no maxillary sinus tenderness and no frontal sinus tenderness.  Mouth/Throat:  Uvula is midline, oropharynx is clear and moist and mucous membranes are normal. Mucous membranes are not pale and not cyanotic. No oral lesions. Abnormal dentition. Dental caries present. No uvula swelling or lacerations. No oropharyngeal exudate, posterior oropharyngeal edema, posterior oropharyngeal erythema or tonsillar abscesses.  Teeth in the R upper are surgically absent.  Gingiva is without erythema, induration or fluctuance.   Small aphthous ulcer noted to the gingiva without signs of secondary infection. No gross abscess No fluctuance or induration to the buccal mucosa or floor of the mouth  Eyes: Conjunctivae are normal. Pupils are equal, round, and reactive to light. Right eye exhibits no discharge. Left eye exhibits no discharge. No scleral icterus.  Neck: Normal range of motion and full passive range of motion without pain. Neck supple.  No stridor Handling secretions without difficulty No nuchal rigidity No cervical lymphadenopathy  Cardiovascular: Regular rhythm, normal heart sounds and intact distal pulses. Tachycardia present.  Pulses:      Radial pulses are 2+ on the right side, and 2+ on the left side.  Pulmonary/Chest: Effort normal. No stridor. No respiratory distress. She has decreased breath sounds. She has no wheezes.  Decreased breath sounds without focal wheezes, rhonchi, rales Congested cough  Abdominal: Soft. Bowel sounds are normal. She exhibits no distension and no mass. There is hepatomegaly. There is tenderness in the right upper quadrant and epigastric area. There is no rigidity, no rebound and no guarding.  Musculoskeletal: Normal range of motion. She exhibits no edema.  Lymphadenopathy:    She has no cervical adenopathy.  Neurological: She is alert.  Speech is clear and goal oriented Moves extremities without ataxia  Skin: Skin is warm and dry. No rash noted. She is not diaphoretic.  Psychiatric: She has a normal mood and affect.  Nursing note and vitals  reviewed.    ED Treatments / Results  Labs (all labs ordered are listed, but only abnormal results are displayed) Labs Reviewed  CBC WITH DIFFERENTIAL/PLATELET - Abnormal; Notable for the following components:      Result Value   WBC 11.2 (*)    RBC 3.81 (*)    MCV 101.6 (*)    MCH 34.4 (*)    All other components within normal limits  COMPREHENSIVE METABOLIC PANEL - Abnormal; Notable for the following components:   Potassium 3.3 (*)    Glucose, Bld 104 (*)    AST 100 (*)  All other components within normal limits  LIPASE, BLOOD   Radiology Dg Chest 2 View  Result Date: 07/29/2017 CLINICAL DATA:  56 year old female with right upper quadrant pain and shortness of breath. EXAM: CHEST  2 VIEW COMPARISON:  Chest radiograph dated 08/07/2016 FINDINGS: Probable mild emphysema. No focal consolidation, pleural effusion, or pneumothorax. The cardiac silhouette is within normal limits. Old healed right rib fractures as well as right humeral head fixation side plate and screws. No acute fracture. IMPRESSION: No active cardiopulmonary disease. Electronically Signed   By: Anner Crete M.D.   On: 07/29/2017 03:44   US Abdomen Complete  Result Date: 07/29/2017 CLINICAL DATA:  Acute onset of right upper quadrant abdominal pain. EXAM: ABDOMEN ULTRASOUND COMPLETE COMPARISON:  None. FINDINGS: Gallbladder: A 0.7 cm stone is seen dependently within the gallbladder. No gallbladder wall thickening or pericholecystic fluid is seen. No ultrasonographic Murphy's sign is elicited. Common bile duct: Diameter: 0.5 cm, within normal limits in caliber. Liver: No focal lesion identified. Within normal limits in parenchymal echogenicity. Portal vein is patent on color Doppler imaging with normal direction of blood flow towards the liver. IVC: No abnormality visualized. Pancreas: Visualized portion unremarkable. Spleen: Size and appearance within normal limits. Right Kidney: Length: 10.2 cm. Echogenicity within  normal limits. No mass or hydronephrosis visualized. Left Kidney: Length: 9.7 cm. Echogenicity within normal limits. No mass or hydronephrosis visualized. Abdominal aorta: No aneurysm visualized. The distal abdominal aorta and aortic bifurcation are obscured by bowel gas. Other findings: None. IMPRESSION: 1. No acute abnormality seen within the abdomen. 2. Cholelithiasis.  Gallbladder otherwise unremarkable. Electronically Signed   By: Garald Balding M.D.   On: 07/29/2017 04:34    Procedures Procedures (including critical care time)  Medications Ordered in ED Medications  sodium chloride 0.9 % bolus 1,000 mL (0 mLs Intravenous Stopped 07/29/17 0400)  pantoprazole (PROTONIX) injection 40 mg (40 mg Intravenous Given 07/29/17 0403)  potassium chloride SA (K-DUR,KLOR-CON) CR tablet 40 mEq (40 mEq Oral Given 07/29/17 0411)  sodium chloride 0.9 % bolus 500 mL (500 mLs Intravenous New Bag/Given 07/29/17 0408)     Initial Impression / Assessment and Plan / ED Course  I have reviewed the triage vital signs and the nursing notes.  Pertinent labs & imaging results that were available during my care of the patient were reviewed by me and considered in my medical decision making (see chart for details).  Clinical Course as of Jul 29 501  Fri Jul 29, 2017  0404 Mild.  Oral replacement given Potassium: (!) 3.3 [HM]  0501 Heart rate improved Pulse Rate: 90 [HM]    Clinical Course User Index [HM] Vermell Madrid, Jarrett Soho, PA-C    Patient with numerous complaints.  Patient with "dental" pain.  No gross abscess.  Exam unconcerning for Ludwig's angina or spread of infection.  Aphthous ulcer likely the source of pain. No signs of secondary infection.   Pt CXR negative for acute infiltrate. Patients symptoms are consistent with URI, likely viral etiology. Discussed that antibiotics are not indicated for viral infections. Symptomatic treatment encouraged.  Smoking cessation discussed.    Pt also with abd pain  and increased intake of alcohol.  Slight elevation in AST noted likely secondary to the alcohol intake.  Ultrasound shows cholelithiasis without evidence of cholecystitis.  No dilation of the common bile duct.  Patient's abdomen remains soft without rebound or guarding on exam.  She is well-appearing and does not meet Sirs or sepsis criteria.  Patient given Protonix here in  the emergency department.  Will be discharged home with same.  Patient given referral to general surgery for further discussion of her abdominal pain and gallstones.  Also discussed the importance of repeat lab work to further assess her liver enzymes.  Discussed reasons to return immediately to the emergency department.  Patient states understanding and is in agreement with the plan.    Final Clinical Impressions(s) / ED Diagnoses   Final diagnoses:  Mouth pain  Right upper quadrant abdominal pain  Calculus of gallbladder without cholecystitis without obstruction  Viral URI with cough  Hypokalemia    ED Discharge Orders        Ordered    albuterol (PROVENTIL HFA;VENTOLIN HFA) 108 (90 Base) MCG/ACT inhaler  Every 4 hours PRN     07/29/17 0458    Spacer/Aero-Holding Chambers (AEROCHAMBER PLUS WITH MASK) inhaler     07/29/17 0458    pantoprazole (PROTONIX) 20 MG tablet  Daily     07/29/17 0458       Akira Perusse, Jarrett Soho, PA-C 07/29/17 0503    Little, Wenda Overland, MD 08/01/17 [phone]

## 2017-10-01 ENCOUNTER — Emergency Department (HOSPITAL_COMMUNITY)
Admission: EM | Admit: 2017-10-01 | Discharge: 2017-10-01 | Disposition: A | Discharge status: Home / Self Care | Payer: Medicaid Other | Attending: Emergency Medicine | Admitting: Emergency Medicine

## 2017-10-01 ENCOUNTER — Encounter (HOSPITAL_COMMUNITY): Payer: Self-pay | Admitting: Emergency Medicine

## 2017-10-01 DIAGNOSIS — Y939 Activity, unspecified: Secondary | ICD-10-CM | POA: Insufficient documentation

## 2017-10-01 DIAGNOSIS — Y929 Unspecified place or not applicable: Secondary | ICD-10-CM | POA: Insufficient documentation

## 2017-10-01 DIAGNOSIS — K0889 Other specified disorders of teeth and supporting structures: Secondary | ICD-10-CM | POA: Diagnosis present

## 2017-10-01 DIAGNOSIS — X58XXXA Exposure to other specified factors, initial encounter: Secondary | ICD-10-CM | POA: Diagnosis not present

## 2017-10-01 DIAGNOSIS — Z7982 Long term (current) use of aspirin: Secondary | ICD-10-CM | POA: Insufficient documentation

## 2017-10-01 DIAGNOSIS — S025XXA Fracture of tooth (traumatic), initial encounter for closed fracture: Secondary | ICD-10-CM

## 2017-10-01 DIAGNOSIS — Y999 Unspecified external cause status: Secondary | ICD-10-CM | POA: Insufficient documentation

## 2017-10-01 DIAGNOSIS — F17210 Nicotine dependence, cigarettes, uncomplicated: Secondary | ICD-10-CM | POA: Insufficient documentation

## 2017-10-01 DIAGNOSIS — Z79899 Other long term (current) drug therapy: Secondary | ICD-10-CM | POA: Insufficient documentation

## 2017-10-01 MED ORDER — BENZOCAINE 10 % MT GEL
Freq: Four times a day (QID) | OROMUCOSAL | Status: DC | PRN
  Filled 2017-10-01: qty 9.4

## 2017-10-01 MED ORDER — PENICILLIN V POTASSIUM 500 MG PO TABS: 500.0000 mg | ORAL_TABLET | Freq: Four times a day (QID) | ORAL | 0 refills | Status: AC

## 2017-10-01 MED ORDER — PENICILLIN V POTASSIUM 500 MG PO TABS
500.0000 mg | ORAL_TABLET | Freq: Once | ORAL | Status: AC
  Administered 2017-10-01: 500 mg via ORAL
  Filled 2017-10-01: qty 1

## 2017-10-01 NOTE — Discharge Instructions (Signed)
Please see the information and instructions below regarding your visit.  Your diagnoses today include:  1. Pain, dental    You have a dental infection. It is very important that you get evaluated by a dentist as soon as possible. Call tomorrow to schedule an appointment.  Benzocaine jelly. Take your full course of antibiotics. Read the instructions below.  Tests performed today include: See side panel of your discharge paperwork for testing performed today. Vital signs are listed at the bottom of these instructions.   Medications prescribed:    Take any prescribed medications only as prescribed, and any over the counter medications only as directed on the packaging.  1. You are prescribed Penicillin, an antibiotic. Please take all of your antibiotics until finished.   You may develop abdominal discomfort or nausea from the antibiotic. If this occurs, you may take it with food. Some patients also get diarrhea with antibiotics. You may help offset this with probiotics which you can buy or get in yogurt. Do not eat or take the probiotics until 2 hours after your antibiotic. Some women develop vaginal yeast infections after antibiotics. If you develop unusual vaginal discharge after being on this medication, please see your primary care provider.   Some people develop allergies to antibiotics. Symptoms of antibiotic allergy can be mild and include a flat rash and itching. They can also be more serious and include:  ?Hives - Hives are raised, red patches of skin that are usually very itchy.  ?Lip or tongue swelling  ?Trouble swallowing or breathing  ?Blistering of the skin or mouth.  If you have any of these serious symptoms, please seek emergency medical care immediately.  You may take Tylenol, 650 mg every 6 hours, so you are receiving something for pain every 3 hours.  Do not exceed 4 g in 1 day.   Home care instructions:  Please follow any educational materials contained in this  packet.   Eat a soft or liquid diet and rinse your mouth out after meals with warm water. You should see a dentist or return here at once if you have increased swelling, increased pain or uncontrolled bleeding from the site of your injury.  Follow-up instructions: It is very important that you see a dentist as soon as possible. There is a list of dentists attached to this packet if you do not have care established with a dentist already. Please give a call to a dentist of your choice tomorrow.  Return instructions:  Please return to the Emergency Department if you experience worsening symptoms.  Please seek care if you note any of the following about your dental pain:  You have increased pain not controlled with medicines.  You have swelling around your tooth, in your face or neck.  You have bleeding which starts, continues, or gets worse.  You have a fever >101 If you are unable to open your mouth Please return if you have any other emergent concerns.  Additional Information:   Your vital signs today were: BP 111/80 (BP Location: Left Arm)    Pulse 89    Temp 98.5 F (36.9 C) (Oral)    Resp 16    SpO2 98%  If your blood pressure (BP) was elevated on multiple readings during this visit above 130 for the top number or above 80 for the bottom number, please have this repeated by your primary care provider within one month. --------------  Thank you for allowing [place] to participate in your care today.

## 2017-10-01 NOTE — ED Provider Notes (Signed)
[facility] DEPT Provider Note   CSN: [id number] Arrival date & time: 10/01/17  1523     History   Chief Complaint Chief Complaint  Patient presents with  . Dental Pain    HPI Jaime Phillips is a 57 y.o. female.  HPI   Jaime Phillips is a 57 y.o. female who presents to the Emergency Department complaining of persistent, gradually worsening, right-sided, upper dental pain beginning 3 days ago after her tooth broke off. Pt describes their pain as sharp and  throbbing. Pt has been taking ibuprofen and Tylenol at home with minimal relief of pain. Pain is exacerbated by temperature extremes. They are not currently followed by dentistry but is looking for one.  Pt denies facial swelling, fever, chills, difficulty breathing, difficulty swallowing.  Patient has reported history of "cirrhosis", or other liver dysfunction but no other immune, my status.  Patient is not currently followed by gastroenterology.  Past Medical History:  Diagnosis Date  . Abnormal uterine bleeding   . Alcoholism ([place])   . Arthritis   . Chronic pain syndrome   . Depression   . Diverticulosis   . Headache(784.0)   . Hiatal hernia   . Hyperplastic colon polyp   . Hypopotassemia   . Internal hemorrhoids   . LFT elevation   . Macrocytic anemia   . Mallory-Weiss tear   . Potassium (K) deficiency   . PUD (peptic ulcer disease)   . Rectal bleeding    minor     Patient Active Problem List   Diagnosis Date Noted  . Humeral head fracture, right, closed, initial encounter 08/08/2016  . Hyponatremia 08/08/2016  . Acute URI 08/08/2016  . Closed fracture of right proximal humerus   . Cause of injury, fall   . Chest pain 08/07/2016  . 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 Alcohol abuse   . Alcohol use disorder, severe, dependence ([place]) 10/03/2015  . Major psychotic depression, recurrent ([place]) 10/03/2015  . Ankle edema 03/17/2015  . Cirrhosis ([place]) 02/24/2015  .  Black stools 02/24/2015  . Abdominal pain, epigastric 01/29/2015  . Nausea with vomiting 01/29/2015  . 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 Dysphagia, pharyngoesophageal phase 01/29/2015  . Hypokalemia 12/19/2014  . Hypomagnesemia 12/19/2014  . Malnutrition of moderate degree ([place]) 12/18/2014  . Alcoholic ketoacidosis 83/66/2947  . Alcohol abuse, in remission 02/14/2014  . Essential hypertension, benign 02/14/2014  . Smoking 02/14/2014  . Upper GI bleed 02/06/2014  . Chronic pain syndrome 02/06/2014  . Gastro-esophageal reflux 02/06/2014  . Dental caries 01/17/2014  . Periprosthetic fracture of hip 05/31/2013  . Hip fracture ([place]) 05/27/2013  . Normocytic anemia 04/11/2013  . S/P right THA, AA 04/10/2013    Past Surgical History:  Procedure Laterality Date  . APPENDECTOMY    . COLONOSCOPY    . ESOPHAGOGASTRODUODENOSCOPY N/A 02/06/2014   Procedure: ESOPHAGOGASTRODUODENOSCOPY (EGD);  Surgeon: Milus Banister, MD;  Location: [facility];  Service: Endoscopy;  Laterality: N/A;  . OOPHORECTOMY    . ORIF HUMERUS FRACTURE Right 08/10/2016   Procedure: OPEN REDUCTION INTERNAL FIXATION (ORIF) RIGHT PROXIMAL HUMERUS FRACTURE;  Surgeon: Renette Butters, MD;  Location: [facility];  Service: Orthopedics;  Laterality: Right;  . ORIF PERIPROSTHETIC FRACTURE Right 05/28/2013   Procedure: OPEN REDUCTION INTERNAL FIXATION (ORIF) PERIPROSTHETIC FRACTURE;  Surgeon: Mauri Pole, MD;  Location: [facility];  Service: Orthopedics;  Laterality: Right;  . SHOULDER SURGERY Left   . TOTAL HIP ARTHROPLASTY Right 04/10/2013   Procedure: RIGHT TOTAL HIP  ARTHROPLASTY ANTERIOR APPROACH;  Surgeon: Mauri Pole, MD;  Location: [facility];  Service: Orthopedics;  Laterality: Right;  . TOTAL HIP ARTHROPLASTY     left hip    OB History    Gravida Para Term Preterm AB Living   2 1 1   1 1    SAB TAB Ectopic Multiple Live Births   1               Home Medications    Prior to Admission medications   Medication Sig  Start Date End Date Taking? Authorizing Provider  albuterol (PROVENTIL HFA;VENTOLIN HFA) 108 (90 Base) MCG/ACT inhaler Inhale 2 puffs into the lungs every 4 (four) hours as needed for wheezing or shortness of breath. 07/29/17   Muthersbaugh, Jarrett Soho, PA-C  aspirin EC 81 MG tablet Take 81 mg by mouth daily.    [provider]  ibuprofen (ADVIL,MOTRIN) 200 MG tablet Take 200-400 mg by mouth every 6 (six) hours as needed for headache or mild pain.    [provider]  Multiple Vitamin (MULTIVITAMIN WITH MINERALS) TABS tablet Take 1 tablet by mouth daily.    [provider]  pantoprazole (PROTONIX) 20 MG tablet Take 1 tablet (20 mg total) by mouth daily. 07/29/17   Muthersbaugh, Jarrett Soho, PA-C  Spacer/Aero-Holding Chambers (AEROCHAMBER PLUS WITH MASK) inhaler Use as instructed 07/29/17   Muthersbaugh, Jarrett Soho, PA-C    Family History Family History  Problem Relation Age of Onset  . Breast cancer Sister   . Diabetes Mother   . Kidney disease Mother   . Heart disease Father   . Colon cancer Father        questionable    Social History Social History   Tobacco Use  . Smoking status: Current Every Day Smoker    Packs/day: 0.50    Years: 25.00    Pack years: 12.50    Types: Cigarettes  . Smokeless tobacco: Never Used  . Tobacco comment: Tobacco info given to pt. 08/16/12  Substance Use Topics  . Alcohol use: Yes    Alcohol/week: 1.2 - 1.8 oz    Types: 2 - 3 Shots of liquor per week    Comment: pt quit drinking 2 months ago (today is 03/11/15)  . Drug use: No     Allergies   Morphine and related   Review of Systems Review of Systems  Constitutional: Negative for chills and fever.  HENT: Positive for dental problem. Negative for sore throat, trouble swallowing and voice change.   Respiratory: Negative for stridor.      Physical Exam Updated Vital Signs BP 122/82 (BP Location: Left Arm)   Pulse (!) 104   Temp 98.5 F (36.9 C) (Oral)   Resp 18   SpO2  97%   Physical Exam  Constitutional: She appears well-developed and well-nourished. No distress.  Sitting comfortably in bed.  HENT:  Head: Normocephalic and atraumatic.  Mouth/Throat:    Eyes: Conjunctivae are normal. Right eye exhibits no discharge. Left eye exhibits no discharge.  EOMs normal to gross examination.  Neck: Normal range of motion.  Cardiovascular: Normal rate and regular rhythm.  Intact, 2+ radial pulse.  Pulmonary/Chest:  Normal respiratory effort. Patient converses comfortably. No audible wheeze or stridor.  Abdominal: She exhibits no distension.  Musculoskeletal: Normal range of motion.  Neurological: She is alert.  Cranial nerves intact to gross observation. Patient moves extremities without difficulty.  Skin: Skin is warm and dry. She is not diaphoretic.  Psychiatric: She has a  normal mood and affect. Her behavior is normal. Judgment and thought content normal.  Nursing note and vitals reviewed.    ED Treatments / Results  Labs (all labs ordered are listed, but only abnormal results are displayed) Labs Reviewed - No data to display  EKG  EKG Interpretation None       Radiology No results found.  Procedures Procedures (including critical care time)  Medications Ordered in ED Medications  benzocaine (ORABASE-B) 20 % paste (not administered)  penicillin v potassium (VEETID) tablet 500 mg (not administered)     Initial Impression / Assessment and Plan / ED Course  I have reviewed the triage vital signs and the nursing notes.  Pertinent labs & imaging results that were available during my care of the patient were reviewed by me and considered in my medical decision making (see chart for details).     Jaime Phillips is a 57 y.o. female who presents to ED for dental pain. No abscess requiring immediate incision and drainage. Patient is afebrile, non toxic appearing, and swallowing secretions well. Exam not concerning for Ludwig's angina or  pharyngeal abscess. Will treat with Pencillin. I provided dental resource guide and stressed the importance of dental follow up for ultimate management of dental pain. Patient voices understanding and is agreeable to plan.  Of note patient is also noting that she has some unusual vaginal discharge.  Patient denies sexual activity.  Patient offered STI testing and pelvic exam, but reports that she would prefer to do this at the health department and not wait.  Resources provided.  Patient instructed to follow-up with patient care clinic as well to establish primary care, as patient is reporting that she would like to get a Pap smear.  Final Clinical Impressions(s) / ED Diagnoses   Final diagnoses:  Pain, dental  Closed fracture of tooth, initial encounter    ED Discharge Orders        Ordered    penicillin v potassium (VEETID) 500 MG tablet  4 times daily     10/01/17 2023       Tamala Julian 10/01/17 2024    Quintella Reichert, MD 10/02/17 [phone]

## 2017-10-01 NOTE — ED Triage Notes (Signed)
Patient c/o left upper dental pain with broken tooth x2 weeks. Denies fevers and swelling.Reports taking ibuprofen, aleve, and tylenol with no relief.

## 2017-12-04 ENCOUNTER — Encounter (HOSPITAL_COMMUNITY): Payer: Self-pay | Admitting: *Deleted

## 2017-12-04 ENCOUNTER — Emergency Department (HOSPITAL_COMMUNITY)
Admission: EM | Admit: 2017-12-04 | Discharge: 2017-12-04 | Disposition: A | Discharge status: Home / Self Care | Payer: Medicaid Other | Attending: Emergency Medicine | Admitting: Emergency Medicine

## 2017-12-04 ENCOUNTER — Other Ambulatory Visit: Payer: Self-pay

## 2017-12-04 DIAGNOSIS — Z046 Encounter for general psychiatric examination, requested by authority: Secondary | ICD-10-CM

## 2017-12-04 DIAGNOSIS — I10 Essential (primary) hypertension: Secondary | ICD-10-CM | POA: Diagnosis not present

## 2017-12-04 DIAGNOSIS — F1010 Alcohol abuse, uncomplicated: Secondary | ICD-10-CM | POA: Insufficient documentation

## 2017-12-04 DIAGNOSIS — F329 Major depressive disorder, single episode, unspecified: Secondary | ICD-10-CM | POA: Diagnosis not present

## 2017-12-04 DIAGNOSIS — F17210 Nicotine dependence, cigarettes, uncomplicated: Secondary | ICD-10-CM | POA: Diagnosis not present

## 2017-12-04 DIAGNOSIS — Z79899 Other long term (current) drug therapy: Secondary | ICD-10-CM | POA: Insufficient documentation

## 2017-12-04 DIAGNOSIS — R45851 Suicidal ideations: Secondary | ICD-10-CM | POA: Insufficient documentation

## 2017-12-04 DIAGNOSIS — F1994 Other psychoactive substance use, unspecified with psychoactive substance-induced mood disorder: Secondary | ICD-10-CM | POA: Diagnosis not present

## 2017-12-04 LAB — COMPREHENSIVE METABOLIC PANEL
ALBUMIN: 4.3 g/dL (ref 3.5–5.0)
ALT: 54 U/L (ref 14–54)
AST: 76 U/L — AB (ref 15–41)
Alkaline Phosphatase: 97 U/L (ref 38–126)
Anion gap: 18 — ABNORMAL HIGH (ref 5–15)
CHLORIDE: 102 mmol/L (ref 101–111)
CO2: 23 mmol/L (ref 22–32)
CREATININE: 0.58 mg/dL (ref 0.44–1.00)
Calcium: 8.6 mg/dL — ABNORMAL LOW (ref 8.9–10.3)
GFR calc Af Amer: >60 mL/min (ref >60)
GLUCOSE: 81 mg/dL (ref 65–99)
POTASSIUM: 3.5 mmol/L (ref 3.5–5.1)
SODIUM: 143 mmol/L (ref 135–145)
Total Bilirubin: 0.5 mg/dL (ref 0.3–1.2)
Total Protein: 7.2 g/dL (ref 6.5–8.1)

## 2017-12-04 LAB — RAPID URINE DRUG SCREEN, HOSP PERFORMED
Amphetamines: NOT DETECTED
BARBITURATES: NOT DETECTED
Benzodiazepines: NOT DETECTED
COCAINE: NOT DETECTED
Opiates: NOT DETECTED
TETRAHYDROCANNABINOL: NOT DETECTED

## 2017-12-04 LAB — CBC
HEMATOCRIT: 42.3 % (ref 36.0–46.0)
Hemoglobin: 14.3 g/dL (ref 12.0–15.0)
MCH: 33.3 pg (ref 26.0–34.0)
MCHC: 33.8 g/dL (ref 30.0–36.0)
MCV: 98.6 fL (ref 78.0–100.0)
Platelets: 276 10*3/uL (ref 150–400)
RBC: 4.29 MIL/uL (ref 3.87–5.11)
RDW: 15.5 % (ref 11.5–15.5)
WBC: 8.4 10*3/uL (ref 4.0–10.5)

## 2017-12-04 LAB — ETHANOL: ALCOHOL ETHYL (B): 280 mg/dL — AB (ref <10)

## 2017-12-04 LAB — I-STAT BETA HCG BLOOD, ED (MC, WL, AP ONLY)

## 2017-12-04 MED ORDER — LORAZEPAM 1 MG PO TABS: 0.0000 mg | ORAL_TABLET | Freq: Two times a day (BID) | ORAL | Status: DC

## 2017-12-04 MED ORDER — LORAZEPAM 2 MG/ML IJ SOLN: 0.0000 mg | Freq: Two times a day (BID) | INTRAMUSCULAR | Status: DC

## 2017-12-04 MED ORDER — LORAZEPAM 2 MG/ML IJ SOLN: 0.0000 mg | Freq: Four times a day (QID) | INTRAMUSCULAR | Status: DC

## 2017-12-04 MED ORDER — ACETAMINOPHEN 325 MG PO TABS
650.0000 mg | ORAL_TABLET | ORAL | Status: DC | PRN
  Filled 2017-12-04: qty 2

## 2017-12-04 MED ORDER — THIAMINE HCL 100 MG/ML IJ SOLN: 100.0000 mg | Freq: Every day | INTRAMUSCULAR | Status: DC

## 2017-12-04 MED ORDER — LORAZEPAM 1 MG PO TABS: 0.0000 mg | ORAL_TABLET | Freq: Four times a day (QID) | ORAL | Status: DC

## 2017-12-04 MED ORDER — ACETAMINOPHEN 325 MG PO TABS
650.0000 mg | ORAL_TABLET | Freq: Once | ORAL | Status: AC
  Administered 2017-12-04: 650 mg via ORAL
  Filled 2017-12-04: qty 2

## 2017-12-04 MED ORDER — VITAMIN B-1 100 MG PO TABS
100.0000 mg | ORAL_TABLET | Freq: Every day | ORAL | Status: DC
  Filled 2017-12-04: qty 1

## 2017-12-04 NOTE — ED Notes (Signed)
Pt wanded by security. Pt's valuables locked with security and 2 bags of pt's belonings placed in locker #1 pod F.

## 2017-12-04 NOTE — ED Notes (Signed)
Patient denies pain and is resting comfortably.  

## 2017-12-04 NOTE — ED Notes (Signed)
Sitter requested

## 2017-12-04 NOTE — ED Notes (Signed)
IVC papers rescinded - copy faxed to [employer], copy sent to Medical Records, and original placed in folder for [employer].

## 2017-12-04 NOTE — Discharge Instructions (Addendum)
You were evaluated in the emergency department for suicidal thoughts and alcohol intoxication.  You are evaluated by behavioral health and they thought you are safe for discharge.  Please follow-up with your doctor and counselors and return to the emergency department if any worsening symptoms.

## 2017-12-04 NOTE — ED Provider Notes (Signed)
57 year old female who is on an IVC.  Per nursing staff the patient has been evaluated by behavioral health and they are removing the IVC.  Patient has been cleared for discharge.       Hayden Rasmussen, MD 12/05/17 0830

## 2017-12-04 NOTE — ED Notes (Signed)
Jaime Phillips from [facility] called and recommendation for pt is to be observed today and reevaluated by psychiatry.

## 2017-12-04 NOTE — Consult Note (Signed)
Telepsych Consultation   Reason for Consult:  IVC due to reported SI while intoxicated Referring Physician:  Dr. Melina Copa Location of Patient:  Location of Provider: [facility] Department  Patient Identification: Jaime Phillips MRN:  [id number] Principal Diagnosis: Substance induced mood disorder [facility]) Diagnosis:   Patient Active Problem List   Diagnosis Date Noted  . Humeral head fracture, right, closed, initial encounter [S42.291A] 08/08/2016  . Hyponatremia [E87.1] 08/08/2016  . Acute URI [J06.9] 08/08/2016  . Closed fracture of right proximal humerus [S42.201A]   . Cause of injury, fall [W19.XXXA]   . Chest pain [R07.9] 08/07/2016  . Alcohol-induced mood disorder ([place]) [F10.94] 11/03/2015  . Substance induced mood disorder ([place]) [F19.94] 11/03/2015  . Alcohol abuse [F10.10]   . Alcohol use disorder, severe, dependence ([place]) [F10.20] 10/03/2015  . Major psychotic depression, recurrent ([place]) [F33.3] 10/03/2015  . Ankle edema [M25.473] 03/17/2015  . Cirrhosis ([place]) [K74.60] 02/24/2015  . Black stools [K92.1] 02/24/2015  . Abdominal pain, epigastric [R10.13] 01/29/2015  . Nausea with vomiting [R11.2] 01/29/2015  . Ulcerative esophagitis [K22.10] 01/29/2015  . Dysphagia, pharyngoesophageal phase [R13.14] 01/29/2015  . Hypokalemia [E87.6] 12/19/2014  . Hypomagnesemia [E83.42] 12/19/2014  . Malnutrition of moderate degree ([place]) [E44.0] 12/18/2014  . Alcoholic ketoacidosis [B14.7] 12/17/2014  . Alcohol abuse, in remission [F10.11] 02/14/2014  . Essential hypertension, benign [I10] 02/14/2014  . Smoking [F17.200] 02/14/2014  . Upper GI bleed [K92.2] 02/06/2014  . Chronic pain syndrome [G89.4] 02/06/2014  . Gastro-esophageal reflux [K21.9] 02/06/2014  . Dental caries [K02.9] 01/17/2014  . Periprosthetic fracture of hip [W29.[place], [place] 05/31/2013  . Hip fracture ([place]) [S72.009A] 05/27/2013  . Normocytic anemia [D64.9] 04/11/2013  . S/P right THA, AA [Z96.649]  04/10/2013    Total Time spent with patient: 30 minutes  Subjective:   Jaime Phillips is a 57 y.o. female patient admitted with reported suicidal thoughts while being intoxicated under IVC commitment.  HPI: Patient is a African-American 57 year old female who is brought to the ED by [employer] after her son took out IVC paperwork on her for reportedly making suicidal threats and walking into oncoming traffic.  Patient states today that she does not remember making any threats of hurting herself and she does not remember going into traffic.  She states that she had a few drinks yesterday and sit on her porch and was sitting inside her house when the police showed up and brought her to the hospital.  She reports that she has drank alcohol twice since the 29-Oct-2022 when her sister died.  She also reports that she gets into arguments with her son and his girlfriend.  She states that she lives with her son and his girlfriend, but she is the one who supplies of financial support as her son does not work.  She reports that her girlfriend is stealing from her and her son believes his girlfriend which is causing multiple arguments in the household.  Patient denies any need for detox or substance treatment.  She reports that she plans to quit drinking because she had a history of it in 2017.  Patient denies SI/HI/AVH and contracts for safety. Patient does not meet inpatient criteria and is cleared by psychiatry.  Past Psychiatric History: Long history of substance abuse, multiple hospitalizations in the past.  Risk to Self: Suicidal Ideation: No(Pt denies) Suicidal Intent: No(pt denies) Is patient at risk for suicide?: No Suicidal Plan?: No Access to Means: No Triggers for Past Attempts: None known Intentional Self Injurious Behavior: None Risk to Others:  Homicidal Ideation: No Thoughts of Harm to Others: No Current Homicidal Intent: No Current Homicidal Plan: No Access to Homicidal Means: No History of harm to  others?: No Assessment of Violence: None Noted Does patient have access to weapons?: No Criminal Charges Pending?: No Does patient have a court date: No Prior Inpatient Therapy: Prior Inpatient Therapy: Yes Prior Therapy Dates: 2017(Multiple times) Prior Therapy Facilty/Provider(s): [facility] Reason for Treatment: Depression/SA Prior Outpatient Therapy: Prior Outpatient Therapy: Yes Prior Therapy Dates: 08/2014 Prior Therapy Facilty/Provider(s): [facility] Reason for Treatment: Substance Abuse Does patient have an ACCT team?: No Does patient have Intensive In-House Services?  : No Does patient have [facility] services? : No Does patient have P4CC services?: No  Past Medical History:  Past Medical History:  Diagnosis Date  . Abnormal uterine bleeding   . Alcoholism ([place])   . Arthritis   . Chronic pain syndrome   . Depression   . Diverticulosis   . Headache(784.0)   . Hiatal hernia   . Hyperplastic colon polyp   . Hypopotassemia   . Internal hemorrhoids   . LFT elevation   . Macrocytic anemia   . Mallory-Weiss tear   . Potassium (K) deficiency   . PUD (peptic ulcer disease)   . Rectal bleeding    minor     Past Surgical History:  Procedure Laterality Date  . APPENDECTOMY    . COLONOSCOPY    . ESOPHAGOGASTRODUODENOSCOPY N/A 02/06/2014   Procedure: ESOPHAGOGASTRODUODENOSCOPY (EGD);  Surgeon: Milus Banister, MD;  Location: [facility];  Service: Endoscopy;  Laterality: N/A;  . OOPHORECTOMY    . ORIF HUMERUS FRACTURE Right 08/10/2016   Procedure: OPEN REDUCTION INTERNAL FIXATION (ORIF) RIGHT PROXIMAL HUMERUS FRACTURE;  Surgeon: Renette Butters, MD;  Location: [facility];  Service: Orthopedics;  Laterality: Right;  . ORIF PERIPROSTHETIC FRACTURE Right 05/28/2013   Procedure: OPEN REDUCTION INTERNAL FIXATION (ORIF) PERIPROSTHETIC FRACTURE;  Surgeon: Mauri Pole, MD;  Location: [facility];  Service: Orthopedics;  Laterality: Right;  . SHOULDER SURGERY Left   . TOTAL HIP  ARTHROPLASTY Right 04/10/2013   Procedure: RIGHT TOTAL HIP ARTHROPLASTY ANTERIOR APPROACH;  Surgeon: Mauri Pole, MD;  Location: [facility];  Service: Orthopedics;  Laterality: Right;  . TOTAL HIP ARTHROPLASTY     left hip   Family History:  Family History  Problem Relation Age of Onset  . Breast cancer Sister   . Diabetes Mother   . Kidney disease Mother   . Heart disease Father   . Colon cancer Father        questionable   Family Psychiatric  History: Denies Social History:  Social History   Substance and Sexual Activity  Alcohol Use Yes  . Alcohol/week: 1.2 - 1.8 oz  . Types: 2 - 3 Shots of liquor per week   Comment: pt quit drinking 2 months ago (today is 03/11/15)     Social History   Substance and Sexual Activity  Drug Use No    Social History   Socioeconomic History  . Marital status: Single    Spouse name: Not on file  . Number of children: 1  . Years of education: Not on file  . Highest education level: Not on file  Occupational History  . Occupation: disabled  Social Needs  . Financial resource strain: Not on file  . Food insecurity:    Worry: Not on file    Inability: Not on file  . Transportation needs:    Medical: Not  on file    Non-medical: Not on file  Tobacco Use  . Smoking status: Current Every Day Smoker    Packs/day: 0.50    Years: 25.00    Pack years: 12.50    Types: Cigarettes  . Smokeless tobacco: Never Used  . Tobacco comment: Tobacco info given to pt. 08/16/12  Substance and Sexual Activity  . Alcohol use: Yes    Alcohol/week: 1.2 - 1.8 oz    Types: 2 - 3 Shots of liquor per week    Comment: pt quit drinking 2 months ago (today is 03/11/15)  . Drug use: No  . Sexual activity: Not Currently  Lifestyle  . Physical activity:    Days per week: Not on file    Minutes per session: Not on file  . Stress: Not on file  Relationships  . Social connections:    Talks on phone: Not on file    Gets together: Not on file    Attends religious  service: Not on file    Active member of club or organization: Not on file    Attends meetings of clubs or organizations: Not on file    Relationship status: Not on file  Other Topics Concern  . Not on file  Social History Narrative  . Not on file   Additional Social History:    Allergies:   Allergies  Allergen Reactions  . Morphine And Related Nausea Only and Anxiety    Labs:  Results for orders placed or performed during the hospital encounter of 12/04/17 (from the past 48 hour(s))  Rapid urine drug screen (hospital performed)     Status: None   Collection Time: 12/04/17 12:20 AM  Result Value Ref Range   Opiates NONE DETECTED NONE DETECTED   Cocaine NONE DETECTED NONE DETECTED   Benzodiazepines NONE DETECTED NONE DETECTED   Amphetamines NONE DETECTED NONE DETECTED   Tetrahydrocannabinol NONE DETECTED NONE DETECTED   Barbiturates NONE DETECTED NONE DETECTED    Comment: (NOTE) DRUG SCREEN FOR MEDICAL PURPOSES ONLY.  IF CONFIRMATION IS NEEDED FOR ANY PURPOSE, NOTIFY LAB WITHIN 5 DAYS. LOWEST DETECTABLE LIMITS FOR URINE DRUG SCREEN Drug Class                     Cutoff (ng/mL) Amphetamine and metabolites    1000 Barbiturate and metabolites    200 Benzodiazepine                 063 Tricyclics and metabolites     300 Opiates and metabolites        300 Cocaine and metabolites        300 THC                            50 Performed at [facility], [place] [address] [place], [place] [address]   Comprehensive metabolic panel     Status: Abnormal   Collection Time: 12/04/17 12:25 AM  Result Value Ref Range   Sodium 143 135 - 145 mmol/L   Potassium 3.5 3.5 - 5.1 mmol/L   Chloride 102 101 - 111 mmol/L   CO2 23 22 - 32 mmol/L   Glucose, Bld 81 65 - 99 mg/dL   BUN <5 (L) 6 - 20 mg/dL   Creatinine, Ser 0.58 0.44 - 1.00 mg/dL   Calcium 8.6 (L) 8.9 - 10.3 mg/dL   Total Protein 7.2 6.5 - 8.1 g/dL   Albumin 4.3  3.5 - 5.0 g/dL   AST 76 (H) 15 - 41 U/L   ALT 54 14 - 54  U/L   Alkaline Phosphatase 97 38 - 126 U/L   Total Bilirubin 0.5 0.3 - 1.2 mg/dL   GFR calc non Af Amer >60 >60 mL/min   GFR calc Af Amer >60 >60 mL/min    Comment: (NOTE) The eGFR has been calculated using the CKD EPI equation. This calculation has not been validated in all clinical situations. eGFR's persistently <60 mL/min signify possible Chronic Kidney Disease.    Anion gap 18 (H) 5 - 15    Comment: Performed at [facility], [place] [address] [place], [place] [address]  Ethanol     Status: Abnormal   Collection Time: 12/04/17 12:25 AM  Result Value Ref Range   Alcohol, Ethyl (B) 280 (H) <10 mg/dL    Comment:        LOWEST DETECTABLE LIMIT FOR SERUM ALCOHOL IS 10 mg/dL FOR MEDICAL PURPOSES ONLY Performed at [facility], [place] [address], [place], [place] [address]   cbc     Status: None   Collection Time: 12/04/17 12:25 AM  Result Value Ref Range   WBC 8.4 4.0 - 10.5 K/uL   RBC 4.29 3.87 - 5.11 MIL/uL   Hemoglobin 14.3 12.0 - 15.0 g/dL   HCT 42.3 36.0 - 46.0 %   MCV 98.6 78.0 - 100.0 fL   MCH 33.3 26.0 - 34.0 pg   MCHC 33.8 30.0 - 36.0 g/dL   RDW 15.5 11.5 - 15.5 %   Platelets 276 150 - 400 K/uL    Comment: Performed at [facility], [place] [address], [place], [place] [address]  I-Stat beta hCG blood, ED     Status: None   Collection Time: 12/04/17 12:28 AM  Result Value Ref Range   I-stat hCG, quantitative <5.0 <5 mIU/mL   Comment 3            Comment:   GEST. AGE      CONC.  (mIU/mL)   <=1 WEEK        5 - 50     2 WEEKS       50 - 500     3 WEEKS       100 - 10,000     4 WEEKS     1,000 - 30,000        FEMALE AND NON-PREGNANT FEMALE:     LESS THAN 5 mIU/mL     Medications:  Current Facility-Administered Medications  Medication Dose Route Frequency Provider Last Rate Last Dose  . acetaminophen (TYLENOL) tablet 650 mg  650 mg Oral Q4H PRN Lajean Saver, MD   325 mg at 12/04/17 0835  . LORazepam (ATIVAN) injection 0-4 mg  0-4 mg Intravenous Q6H  Rancour, Annie Main, MD       Or  . LORazepam (ATIVAN) tablet 0-4 mg  0-4 mg Oral Q6H Rancour, Stephen, MD      . Derrill Memo ON 12/06/2017] LORazepam (ATIVAN) injection 0-4 mg  0-4 mg Intravenous Q12H Rancour, Stephen, MD       Or  . Derrill Memo ON 12/06/2017] LORazepam (ATIVAN) tablet 0-4 mg  0-4 mg Oral Q12H Rancour, Stephen, MD      . thiamine (VITAMIN B-1) tablet 100 mg  100 mg Oral Daily Rancour, Stephen, MD   100 mg at 12/04/17 8502   Or  . thiamine (B-1) injection 100 mg  100 mg Intravenous  Daily Rancour, Annie Main, MD       Current Outpatient Medications  Medication Sig Dispense Refill  . ibuprofen (ADVIL,MOTRIN) 200 MG tablet Take 200-400 mg by mouth every 6 (six) hours as needed for headache or mild pain.    . Multiple Vitamin (MULTIVITAMIN WITH MINERALS) TABS tablet Take 1 tablet by mouth daily.    . naproxen sodium (ALEVE) 220 MG tablet Take 220 mg by mouth daily as needed (for pain).    Marland Kitchen albuterol (PROVENTIL HFA;VENTOLIN HFA) 108 (90 Base) MCG/ACT inhaler Inhale 2 puffs into the lungs every 4 (four) hours as needed for wheezing or shortness of breath. (Patient not taking: Reported on 12/04/2017) 1 Inhaler 3  . pantoprazole (PROTONIX) 20 MG tablet Take 1 tablet (20 mg total) by mouth daily. (Patient not taking: Reported on 12/04/2017) 30 tablet 0  . Spacer/Aero-Holding Chambers (AEROCHAMBER PLUS WITH MASK) inhaler Use as instructed 1 each 2    Musculoskeletal: Strength & Muscle Tone: within normal limits Gait & Station: normal Patient leans: N/A  Psychiatric Specialty Exam: Physical Exam  Nursing note and vitals reviewed. Constitutional: She is oriented to person, place, and time. She appears well-developed and well-nourished.  Cardiovascular: Normal rate.  Respiratory: Effort normal.  Musculoskeletal: Normal range of motion.  Neurological: She is alert and oriented to person, place, and time.  Skin: Skin is warm.    Review of Systems  Constitutional: Negative.   HENT: Negative.   Eyes:  Negative.   Respiratory: Negative.   Cardiovascular: Negative.   Gastrointestinal: Negative.   Genitourinary: Negative.   Musculoskeletal: Negative.   Skin: Negative.   Neurological: Negative.   Endo/Heme/Allergies: Negative.   Psychiatric/Behavioral: Positive for substance abuse. Negative for depression, hallucinations and suicidal ideas.    Blood pressure (!) 147/93, pulse (!) 104, temperature 98.4 F (36.9 C), temperature source Oral, resp. rate 18, SpO2 100 %.There is no height or weight on file to calculate BMI.  General Appearance: Casual  Eye Contact:  Good  Speech:  Clear and Coherent and Normal Rate  Volume:  Normal  Mood:  Anxious  Affect:  Congruent  Thought Process:  Goal Directed and Descriptions of Associations: Intact  Orientation:  Full (Time, Place, and Person)  Thought Content:  WDL  Suicidal Thoughts:  No  Homicidal Thoughts:  No  Memory:  Immediate;   Good Recent;   Good Remote;   Good  Judgement:  Fair  Insight:  Fair  Psychomotor Activity:  Normal  Concentration:  Concentration: Good and Attention Span: Good  Recall:  Good  Fund of Knowledge:  Good  Language:  Good  Akathisia:  No  Handed:  Right  AIMS (if indicated):     Assets:  Communication Skills Desire for Improvement Housing Resilience Social Support  ADL's:  Intact  Cognition:  WNL  Sleep:        Treatment Plan Summary: Recommend patient stop using alcohol Recommend patient to follow-up with AA and found sponsor Recommend patient follow-up with the [facility] or [facility] for continued therapy and support  Disposition: No evidence of imminent risk to self or others at present.   Patient does not meet criteria for psychiatric inpatient admission. Discussed crisis plan, support from social network, calling 911, coming to the Emergency Department, and calling Suicide Hotline.  This service was provided via telemedicine using a 2-way, interactive audio and video technology.  Names of all  persons participating in this telemedicine service and their role in this encounter. Name: Jaime Phillips Role: patient  Name: [facility]  Role: BP, Provider  Name:  Role:   Name:  Role:     Lewis Shock, FNP 12/04/2017 9:10 AM

## 2017-12-04 NOTE — ED Notes (Signed)
Pt requesting to "wash up" before she goes. Advised pt she may and encouraged pt to call for ride. Offered bus pass - declined.

## 2017-12-04 NOTE — ED Notes (Addendum)
TTS in progress. Pt calm and cooperative at this time. Breakfast tray ordered.

## 2017-12-04 NOTE — BH Assessment (Signed)
Tele Assessment Note   Patient Name: Aneyah Lortz MRN: [id number] Referring Physician: Ezequiel Essex, MD Location of Patient:  [facility] Location of Provider: [place]  Murriel Holwerda is an 57 y.o. single female who was brought to [facility] by [employer] after being IVC'd by her son for "being a danger to herself".  Pt denies wanting to hurt herself or anyone else.  Pt states "my friend died 1 yr ago after a 70 yr relationship and my sister just died last week."  Pt denies having an issue with substances but admits to "drinking 3 airplane bottles when my sister died and I had a drink today." According to the IVC paperwork "Respondent's son stated she wanted to kill herself. Son also stated she stands in the middle of the road with oncoming traffic coming through, pi is a danger to herself.  According to pt's chart pt has had multiple admissions under IVC orders to [facility] in 2017 (11/01/15, 12/03/15, 02/14/16 and 04/12/16) for threats to family statements of self-harms excessive SA   Pt denies A/V hallucination.  Pt did not appear to be responding to internal stimuli. Pt was able to contract for safety.   Patient was wearing scrubs and appeared appropriately groomed.  Pt was alert throughout the assessment.  Patient made fair eye contact and had normal psychomotor activity.  Patient spoke in a normal voice without pressured speech.  Pt expressed feeling sad about losing her sister last week.  Pt's affect appeared Euthymic normal  and congruent with stated mood. Pt's thought process was coherent and logical  Pt presented with fair insight and judgement.  Pt did not appear to be responding to internal stimuli. Pt was able to contract for safety.  Disposition: Discussed case with [facility] provider, Lindon Romp who recommends that patient is observed for safety and stability then re-evaluated by psychiatry.  LPC informed ER provider, Charlann Lange, PA-C and pt's nurse,  Candy, RN of NP's recommended disposition.  Diagnosis: Major Depressive Disorder; severe  Past Medical History:  Past Medical History:  Diagnosis Date  . Abnormal uterine bleeding   . Alcoholism ([place])   . Arthritis   . Chronic pain syndrome   . Depression   . Diverticulosis   . Headache(784.0)   . Hiatal hernia   . Hyperplastic colon polyp   . Hypopotassemia   . Internal hemorrhoids   . LFT elevation   . Macrocytic anemia   . Mallory-Weiss tear   . Potassium (K) deficiency   . PUD (peptic ulcer disease)   . Rectal bleeding    minor     Past Surgical History:  Procedure Laterality Date  . APPENDECTOMY    . COLONOSCOPY    . ESOPHAGOGASTRODUODENOSCOPY N/A 02/06/2014   Procedure: ESOPHAGOGASTRODUODENOSCOPY (EGD);  Surgeon: Milus Banister, MD;  Location: [place];  Service: Endoscopy;  Laterality: N/A;  . OOPHORECTOMY    . ORIF HUMERUS FRACTURE Right 08/10/2016   Procedure: OPEN REDUCTION INTERNAL FIXATION (ORIF) RIGHT PROXIMAL HUMERUS FRACTURE;  Surgeon: Renette Butters, MD;  Location: [place];  Service: Orthopedics;  Laterality: Right;  . ORIF PERIPROSTHETIC FRACTURE Right 05/28/2013   Procedure: OPEN REDUCTION INTERNAL FIXATION (ORIF) PERIPROSTHETIC FRACTURE;  Surgeon: Mauri Pole, MD;  [facility];  Service: Orthopedics;  Laterality: Right;  . SHOULDER SURGERY Left   . TOTAL HIP ARTHROPLASTY Right 04/10/2013   Procedure: RIGHT TOTAL HIP ARTHROPLASTY ANTERIOR APPROACH;  Surgeon: Mauri Pole, MD;  [facility];  Service:  Orthopedics;  Laterality: Right;  . TOTAL HIP ARTHROPLASTY     left hip    Family History:  Family History  Problem Relation Age of Onset  . Breast cancer Sister   . Diabetes Mother   . Kidney disease Mother   . Heart disease Father   . Colon cancer Father        questionable    Social History:  reports that she has been smoking cigarettes.  She has a 12.50 pack-year smoking history. She has never used smokeless tobacco. She reports  that she drinks about 1.2 - 1.8 oz of alcohol per week. She reports that she does not use drugs.  Additional Social History:  Alcohol / Drug Use Pain Medications: See MARs Prescriptions: See MARs Over the Counter: See MARs History of alcohol / drug use?: Yes Longest period of sobriety (when/how long): 2 yrs Substance #1 Name of Substance 1: Alcohol 1 - Age of First Use: unknown 1 - Amount (size/oz): 3 airplane bottles 1 - Frequency: every other week 1 - Duration: 10 years 1 - Last Use / Amount: pta  CIWA: CIWA-Ar BP: 140/90 Pulse Rate: (!) 112 COWS:    Allergies:  Allergies  Allergen Reactions  . Morphine And Related Nausea Only and Anxiety    Home Medications:  (Not in a hospital admission)  OB/GYN Status:  No LMP recorded. Patient is postmenopausal.  General Assessment Data Assessment unable to be completed: Yes Reason for not completing assessment: Pt in hallway when TTS consult received waiting on pt to be moved to room to complete assessment Location of Assessment: [facility] ED TTS Assessment: In system Is this a Tele or Face-to-Face Assessment?: Tele Assessment Is this an Initial Assessment or a Re-assessment for this encounter?: Initial Assessment Marital status: Single Maiden name: Gu Is patient pregnant?: No Pregnancy Status: No Living Arrangements: Children Can pt return to current living arrangement?: Yes Admission Status: Involuntary Is patient capable of signing voluntary admission?: No(Pt was IVC'd by her son) Referral Source: Other Insurance type: [place] MEdicaid     Crisis Care Plan Living Arrangements: Children Legal Guardian: Other:(Self)  Education Status Is patient currently in school?: No Is the patient employed, unemployed or receiving disability?: Receiving disability income  Risk to self with the past 6 months Suicidal Ideation: No(Pt denies) Has patient been a risk to self within the past 6 months prior to admission? : No Suicidal  Intent: No(pt denies) Has patient had any suicidal intent within the past 6 months prior to admission? : No Is patient at risk for suicide?: No Suicidal Plan?: No Has patient had any suicidal plan within the past 6 months prior to admission? : No Access to Means: No Previous Attempts/Gestures: No Triggers for Past Attempts: None known Intentional Self Injurious Behavior: None Family Suicide History: No Recent stressful life event(s): Loss (Comment)(Mate and sister) Depression: Yes Depression Symptoms: Insomnia, Tearfulness, Loss of interest in usual pleasures Substance abuse history and/or treatment for substance abuse?: No Suicide prevention information given to non-admitted patients: Not applicable  Risk to Others within the past 6 months Homicidal Ideation: No Does patient have any lifetime risk of violence toward others beyond the six months prior to admission? : No Thoughts of Harm to Others: No Current Homicidal Intent: No Current Homicidal Plan: No Access to Homicidal Means: No History of harm to others?: No Assessment of Violence: None Noted Does patient have access to weapons?: No Criminal Charges Pending?: No Does patient have a court date: No Is  patient on probation?: No  Psychosis Hallucinations: None noted Delusions: None noted  Mental Status Report Appearance/Hygiene: In scrubs Eye Contact: Poor Motor Activity: Restlessness Speech: Soft Level of Consciousness: Alert, Restless Mood: Anxious, Apprehensive Affect: Anxious Anxiety Level: Moderate Thought Processes: Coherent, Irrelevant Judgement: Partial Orientation: Person, Place, Time, Appropriate for developmental age Obsessive Compulsive Thoughts/Behaviors: Minimal  Cognitive Functioning Concentration: Fair Memory: Recent Intact, Remote Intact Is patient IDD: No Is patient DD?: No Insight: Fair Impulse Control: Fair Appetite: Fair Have you had any weight changes? : No Change Sleep:  Decreased Total Hours of Sleep: 4 Vegetative Symptoms: None  ADLScreening [facility] Assessment Services) Patient's cognitive ability adequate to safely complete daily activities?: Yes Patient able to express need for assistance with ADLs?: Yes Independently performs ADLs?: Yes (appropriate for developmental age)  Prior Inpatient Therapy Prior Inpatient Therapy: Yes Prior Therapy Dates: 2017(Multiple times) Prior Therapy Facilty/Provider(s): [facility] Reason for Treatment: Depression/SA  Prior Outpatient Therapy Prior Outpatient Therapy: Yes Prior Therapy Dates: 08/2014 Prior Therapy Facilty/Provider(s): [facility] Reason for Treatment: Substance Abuse Does patient have an ACCT team?: No Does patient have Intensive In-House Services?  : No Does patient have [facility] services? : No Does patient have P4CC services?: No  ADL Screening (condition at time of admission) Patient's cognitive ability adequate to safely complete daily activities?: Yes Is the patient deaf or have difficulty hearing?: No Does the patient have difficulty seeing, even when wearing glasses/contacts?: No Does the patient have difficulty concentrating, remembering, or making decisions?: No Patient able to express need for assistance with ADLs?: Yes Does the patient have difficulty dressing or bathing?: No Independently performs ADLs?: Yes (appropriate for developmental age) Does the patient have difficulty walking or climbing stairs?: No Weakness of Legs: None Weakness of Arms/Hands: None  Home Assistive Devices/Equipment Home Assistive Devices/Equipment: None    Abuse/Neglect Assessment (Assessment to be complete while patient is alone) Abuse/Neglect Assessment Can Be Completed: Yes Physical Abuse: Denies Verbal Abuse: Denies Sexual Abuse: Denies Exploitation of patient/patient's resources: Denies Self-Neglect: Denies   Consults Social Work Consult Needed: Yes (Comment)(Pt states she need to speak to  a Education officer, museum about at assistance) Regulatory affairs officer (For Healthcare) Does Patient Have a Medical Advance Directive?: No Would patient like information on creating a medical advance directive?: No - Patient declined    Disposition: Discussed case with [facility] provider, Lindon Romp who recommends that patient is observed for safety and stability then re-evaluated by psychiatry.  LPC informed ER provider, Charlann Lange, PA-C and pt's nurse, Candy, RN of NP's recommended disposition.   .  Disposition Initial Assessment Completed for this Encounter: Yes Patient referred to: Other (Comment)  This service was provided via telemedicine using a 2-way, interactive audio and video technology.  Names of all persons participating in this telemedicine service and their role in this encounter. Name: Rani Idler Role: Patient  Name: Omer Puccinelli L. Tionne Carelli, MS, LPC, [place] Role: Therapist  Name:  Role:   Name:  Role:     [place], MS, LPC, [facility] 12/04/2017 6:36 AM

## 2017-12-04 NOTE — ED Notes (Signed)
Telepsych completed.  

## 2017-12-04 NOTE — ED Notes (Signed)
ALL belongings - 2 labeled belongings bags and 1 valuables envelope - returned to pt - Pt signed verifying all items present.

## 2017-12-04 NOTE — ED Notes (Signed)
Pt unable to swallow pills. States she is has issues w/her esophagus. States she crushes her pills at home. Offered to administer pills again - pt declined - states she will wait until she gets home.

## 2017-12-04 NOTE — BH Assessment (Signed)
  BH ASSESSMENT  TTS consult received while pt was in the hallway waiting on pt to moved to room in order to complete [facility] assessment.  Allene Furuya L. Yanai Hobson, MS, [place], [facility] Therapeutic Triage Specialist  [phone]

## 2017-12-04 NOTE — ED Provider Notes (Signed)
[place] EMERGENCY DEPARTMENT Provider Note   CSN: [id number] Arrival date & time: 12/04/17  0005     History   Chief Complaint Chief Complaint  Patient presents with  . Psychiatric Evaluation    HPI Jaime Phillips is a 57 y.o. female.  Patient presents under IVC taken out by son who is concerned for altered mental status, alcohol abuse and being a threat to herself. Per son via IVC she has made suicidal threats and has been found standing in traffic, posing a direct danger to herself and others. The patient denies SI/HI/AVH. She does not understand why she is here but when told she was under IVC petition she states "this happens all the time". She states she had stopped drinking for a long time but started again recently when her sister died.   The history is provided by the patient. No language interpreter was used.    Past Medical History:  Diagnosis Date  . Abnormal uterine bleeding   . Alcoholism ([place])   . Arthritis   . Chronic pain syndrome   . Depression   . Diverticulosis   . Headache(784.0)   . Hiatal hernia   . Hyperplastic colon polyp   . Hypopotassemia   . Internal hemorrhoids   . LFT elevation   . Macrocytic anemia   . Mallory-Weiss tear   . Potassium (K) deficiency   . PUD (peptic ulcer disease)   . Rectal bleeding    minor     Patient Active Problem List   Diagnosis Date Noted  . Humeral head fracture, right, closed, initial encounter 08/08/2016  . Hyponatremia 08/08/2016  . Acute URI 08/08/2016  . Closed fracture of right proximal humerus   . Cause of injury, fall   . Chest pain 08/07/2016  . 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 Alcohol abuse   . Alcohol use disorder, severe, dependence ([place]) 10/03/2015  . Major psychotic depression, recurrent ([place]) 10/03/2015  . Ankle edema 03/17/2015  . Cirrhosis ([place]) 02/24/2015  . Black stools 02/24/2015  . Abdominal pain, epigastric 01/29/2015  . Nausea with vomiting  01/29/2015  . 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 Dysphagia, pharyngoesophageal phase 01/29/2015  . Hypokalemia 12/19/2014  . Hypomagnesemia 12/19/2014  . Malnutrition of moderate degree ([place]) 12/18/2014  . Alcoholic ketoacidosis 56/31/4970  . Alcohol abuse, in remission 02/14/2014  . Essential hypertension, benign 02/14/2014  . Smoking 02/14/2014  . Upper GI bleed 02/06/2014  . Chronic pain syndrome 02/06/2014  . Gastro-esophageal reflux 02/06/2014  . Dental caries 01/17/2014  . Periprosthetic fracture of hip 05/31/2013  . Hip fracture ([place]) 05/27/2013  . Normocytic anemia 04/11/2013  . S/P right THA, AA 04/10/2013    Past Surgical History:  Procedure Laterality Date  . APPENDECTOMY    . COLONOSCOPY    . ESOPHAGOGASTRODUODENOSCOPY N/A 02/06/2014   Procedure: ESOPHAGOGASTRODUODENOSCOPY (EGD);  Surgeon: Milus Banister, MD;  Location: [place];  Service: Endoscopy;  Laterality: N/A;  . OOPHORECTOMY    . ORIF HUMERUS FRACTURE Right 08/10/2016   Procedure: OPEN REDUCTION INTERNAL FIXATION (ORIF) RIGHT PROXIMAL HUMERUS FRACTURE;  Surgeon: Renette Butters, MD;  Location: [place];  Service: Orthopedics;  Laterality: Right;  . ORIF PERIPROSTHETIC FRACTURE Right 05/28/2013   Procedure: OPEN REDUCTION INTERNAL FIXATION (ORIF) PERIPROSTHETIC FRACTURE;  Surgeon: Mauri Pole, MD;  [facility];  Service: Orthopedics;  Laterality: Right;  . SHOULDER SURGERY Left   . TOTAL HIP ARTHROPLASTY Right 04/10/2013   Procedure: RIGHT TOTAL HIP ARTHROPLASTY ANTERIOR  APPROACH;  Surgeon: Mauri Pole, MD;  [facility];  Service: Orthopedics;  Laterality: Right;  . TOTAL HIP ARTHROPLASTY     left hip     OB History    Gravida  2   Para  1   Term  1   Preterm      AB  1   Living  1     SAB  1   TAB      Ectopic      Multiple      Live Births               Home Medications    Prior to Admission medications   Medication Sig Start Date End Date Taking? Authorizing  Provider  ibuprofen (ADVIL,MOTRIN) 200 MG tablet Take 200-400 mg by mouth every 6 (six) hours as needed for headache or mild pain.   Yes [provider]  Multiple Vitamin (MULTIVITAMIN WITH MINERALS) TABS tablet Take 1 tablet by mouth daily.   Yes [provider]  naproxen sodium (ALEVE) 220 MG tablet Take 220 mg by mouth daily as needed (for pain).   Yes [provider]  albuterol (PROVENTIL HFA;VENTOLIN HFA) 108 (90 Base) MCG/ACT inhaler Inhale 2 puffs into the lungs every 4 (four) hours as needed for wheezing or shortness of breath. Patient not taking: Reported on 12/04/2017 07/29/17   Muthersbaugh, Jarrett Soho, PA-C  pantoprazole (PROTONIX) 20 MG tablet Take 1 tablet (20 mg total) by mouth daily. Patient not taking: Reported on 12/04/2017 07/29/17   Muthersbaugh, Jarrett Soho, PA-C  Spacer/Aero-Holding Chambers (AEROCHAMBER PLUS WITH MASK) inhaler Use as instructed 07/29/17   Muthersbaugh, Jarrett Soho, PA-C    Family History Family History  Problem Relation Age of Onset  . Breast cancer Sister   . Diabetes Mother   . Kidney disease Mother   . Heart disease Father   . Colon cancer Father        questionable    Social History Social History   Tobacco Use  . Smoking status: Current Every Day Smoker    Packs/day: 0.50    Years: 25.00    Pack years: 12.50    Types: Cigarettes  . Smokeless tobacco: Never Used  . Tobacco comment: Tobacco info given to pt. 08/16/12  Substance Use Topics  . Alcohol use: Yes    Alcohol/week: 1.2 - 1.8 oz    Types: 2 - 3 Shots of liquor per week    Comment: pt quit drinking 2 months ago (today is 03/11/15)  . Drug use: No     Allergies   Morphine and related   Review of Systems Review of Systems  Constitutional: Negative for chills and fever.  HENT: Negative.   Respiratory: Negative.   Cardiovascular: Negative.   Gastrointestinal: Negative.   Musculoskeletal: Negative.   Skin: Negative.   Neurological: Negative.     Psychiatric/Behavioral: Negative for suicidal ideas.     Physical Exam Updated Vital Signs BP 140/90 (BP Location: Right Arm)   Pulse (!) 112   Temp 98.2 F (36.8 C) (Oral)   Resp 16   SpO2 96%   Physical Exam  Constitutional: She is oriented to person, place, and time. She appears well-developed and well-nourished.  Acutely intoxicated but coherent.  HENT:  Head: Normocephalic.  Neck: Normal range of motion. Neck supple.  Cardiovascular: Normal rate and regular rhythm.  Pulmonary/Chest: Effort normal and breath sounds normal. She has no wheezes. She has no rales.  Abdominal: Soft. Bowel sounds are  normal. There is no tenderness. There is no rebound and no guarding.  Musculoskeletal: Normal range of motion.  Neurological: She is alert and oriented to person, place, and time. No sensory deficit. Coordination normal.  Skin: Skin is warm and dry. No rash noted.  Psychiatric: She has a normal mood and affect.     ED Treatments / Results  Labs (all labs ordered are listed, but only abnormal results are displayed) Labs Reviewed  COMPREHENSIVE METABOLIC PANEL - Abnormal; Notable for the following components:      Result Value   BUN <5 (*)    Calcium 8.6 (*)    AST 76 (*)    Anion gap 18 (*)    All other components within normal limits  ETHANOL - Abnormal; Notable for the following components:   Alcohol, Ethyl (B) 280 (*)    All other components within normal limits  CBC  RAPID URINE DRUG SCREEN, HOSP PERFORMED  I-STAT BETA HCG BLOOD, ED ([facility], [facility], [facility] ONLY)   Results for orders placed or performed during the hospital encounter of 12/04/17  Comprehensive metabolic panel  Result Value Ref Range   Sodium 143 135 - 145 mmol/L   Potassium 3.5 3.5 - 5.1 mmol/L   Chloride 102 101 - 111 mmol/L   CO2 23 22 - 32 mmol/L   Glucose, Bld 81 65 - 99 mg/dL   BUN <5 (L) 6 - 20 mg/dL   Creatinine, Ser 0.58 0.44 - 1.00 mg/dL   Calcium 8.6 (L) 8.9 - 10.3 mg/dL   Total Protein 7.2 6.5 -  8.1 g/dL   Albumin 4.3 3.5 - 5.0 g/dL   AST 76 (H) 15 - 41 U/L   ALT 54 14 - 54 U/L   Alkaline Phosphatase 97 38 - 126 U/L   Total Bilirubin 0.5 0.3 - 1.2 mg/dL   GFR calc non Af Amer >60 >60 mL/min   GFR calc Af Amer >60 >60 mL/min   Anion gap 18 (H) 5 - 15  Ethanol  Result Value Ref Range   Alcohol, Ethyl (B) 280 (H) <10 mg/dL  cbc  Result Value Ref Range   WBC 8.4 4.0 - 10.5 K/uL   RBC 4.29 3.87 - 5.11 MIL/uL   Hemoglobin 14.3 12.0 - 15.0 g/dL   HCT 42.3 36.0 - 46.0 %   MCV 98.6 78.0 - 100.0 fL   MCH 33.3 26.0 - 34.0 pg   MCHC 33.8 30.0 - 36.0 g/dL   RDW 15.5 11.5 - 15.5 %   Platelets 276 150 - 400 K/uL  Rapid urine drug screen (hospital performed)  Result Value Ref Range   Opiates NONE DETECTED NONE DETECTED   Cocaine NONE DETECTED NONE DETECTED   Benzodiazepines NONE DETECTED NONE DETECTED   Amphetamines NONE DETECTED NONE DETECTED   Tetrahydrocannabinol NONE DETECTED NONE DETECTED   Barbiturates NONE DETECTED NONE DETECTED  I-Stat beta hCG blood, ED  Result Value Ref Range   I-stat hCG, quantitative <5.0 <5 mIU/mL   Comment 3            EKG None  Radiology No results found.  Procedures Procedures (including critical care time)  Medications Ordered in ED Medications  acetaminophen (TYLENOL) tablet 650 mg (650 mg Oral Given 12/04/17 0344)     Initial Impression / Assessment and Plan / ED Course  I have reviewed the triage vital signs and the nursing notes.  Pertinent labs & imaging results that were available during my care of the patient were  reviewed by me and considered in my medical decision making (see chart for details).     Patient is here under IVC for altered mental status, SI and danger to self by standing in traffic. TTS consultation to determine disposition.   Final Clinical Impressions(s) / ED Diagnoses   Final diagnoses:  None   1. IVC 2. SI 3. Alcohol intoxication  ED Discharge Orders    None       Charlann Lange,  Hershal Coria 12/04/17 1749    Ezequiel Essex, MD 12/04/17 [phone]

## 2017-12-04 NOTE — ED Triage Notes (Signed)
The pt was brought in by gpd with ivc papers.  The pt is alert co-operative  And reports that her son sent  Her here. She is not siuicidal and she  Does not know why she is here.  She has been drinking today

## 2017-12-04 NOTE — ED Notes (Signed)
The pt has been wanded is in burgundy scrubs her personal property has been placed in the nurses station.  Chg notified of pt

## 2017-12-12 ENCOUNTER — Other Ambulatory Visit: Payer: Self-pay

## 2017-12-12 ENCOUNTER — Encounter (HOSPITAL_COMMUNITY): Payer: Self-pay

## 2017-12-12 ENCOUNTER — Emergency Department (HOSPITAL_COMMUNITY)
Admission: EM | Admit: 2017-12-12 | Discharge: 2017-12-13 | Disposition: A | Discharge status: Home / Self Care | Payer: Medicaid Other | Attending: Emergency Medicine | Admitting: Emergency Medicine

## 2017-12-12 DIAGNOSIS — Z96642 Presence of left artificial hip joint: Secondary | ICD-10-CM | POA: Insufficient documentation

## 2017-12-12 DIAGNOSIS — F10920 Alcohol use, unspecified with intoxication, uncomplicated: Secondary | ICD-10-CM | POA: Insufficient documentation

## 2017-12-12 DIAGNOSIS — F17210 Nicotine dependence, cigarettes, uncomplicated: Secondary | ICD-10-CM | POA: Diagnosis not present

## 2017-12-12 DIAGNOSIS — Z96641 Presence of right artificial hip joint: Secondary | ICD-10-CM | POA: Insufficient documentation

## 2017-12-12 NOTE — ED Notes (Signed)
Bed: PV94 Expected date:  Expected time:  Means of arrival:  Comments: EMS intoxicated

## 2017-12-12 NOTE — ED Triage Notes (Signed)
Pt denies pain, A/Ox4, ambulatory to restroom with 1 staff assist.

## 2017-12-12 NOTE — ED Notes (Signed)
Water and gingerale given. Pt resting comfortably

## 2017-12-12 NOTE — ED Provider Notes (Signed)
[facility] DEPT Provider Note   CSN: [id number] Arrival date & time: 12/12/17  2028     History   Chief Complaint Chief Complaint  Patient presents with  . Alcohol Intoxication    HPI Jaime Phillips is a 57 y.o. female.  HPI Patient with history of alcoholism admits to drinking multiple alcoholic beverages earlier today.  She is brought in by EMS.  She currently has no complaints.  She denies any other coingestions.  Denies suicidal ideation.   Past Medical History:  Diagnosis Date  . Abnormal uterine bleeding   . Alcoholism ([place])   . Arthritis   . Chronic pain syndrome   . Depression   . Diverticulosis   . Headache(784.0)   . Hiatal hernia   . Hyperplastic colon polyp   . Hypopotassemia   . Internal hemorrhoids   . LFT elevation   . Macrocytic anemia   . Mallory-Weiss tear   . Potassium (K) deficiency   . PUD (peptic ulcer disease)   . Rectal bleeding    minor     Patient Active Problem List   Diagnosis Date Noted  . Humeral head fracture, right, closed, initial encounter 08/08/2016  . Hyponatremia 08/08/2016  . Acute URI 08/08/2016  . Closed fracture of right proximal humerus   . Cause of injury, fall   . Chest pain 08/07/2016  . 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 Substance induced mood disorder ([place]) 11/03/2015  . Alcohol abuse   . Alcohol use disorder, severe, dependence ([place]) 10/03/2015  . Major psychotic depression, recurrent ([place]) 10/03/2015  . Ankle edema 03/17/2015  . Cirrhosis ([place]) 02/24/2015  . Black stools 02/24/2015  . Abdominal pain, epigastric 01/29/2015  . Nausea with vomiting 01/29/2015  . 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 Dysphagia, pharyngoesophageal phase 01/29/2015  . Hypokalemia 12/19/2014  . Hypomagnesemia 12/19/2014  . Malnutrition of moderate degree ([place]) 12/18/2014  . Alcoholic ketoacidosis 63/87/5643  . Alcohol abuse, in remission 02/14/2014  . Essential hypertension,  benign 02/14/2014  . Smoking 02/14/2014  . Upper GI bleed 02/06/2014  . Chronic pain syndrome 02/06/2014  . Gastro-esophageal reflux 02/06/2014  . Dental caries 01/17/2014  . Periprosthetic fracture of hip 05/31/2013  . Hip fracture ([place]) 05/27/2013  . Normocytic anemia 04/11/2013  . S/P right THA, AA 04/10/2013    Past Surgical History:  Procedure Laterality Date  . APPENDECTOMY    . COLONOSCOPY    . ESOPHAGOGASTRODUODENOSCOPY N/A 02/06/2014   Procedure: ESOPHAGOGASTRODUODENOSCOPY (EGD);  Surgeon: Milus Banister, MD;  Location: [facility];  Service: Endoscopy;  Laterality: N/A;  . OOPHORECTOMY    . ORIF HUMERUS FRACTURE Right 08/10/2016   Procedure: OPEN REDUCTION INTERNAL FIXATION (ORIF) RIGHT PROXIMAL HUMERUS FRACTURE;  Surgeon: Renette Butters, MD;  Location: [facility];  Service: Orthopedics;  Laterality: Right;  . ORIF PERIPROSTHETIC FRACTURE Right 05/28/2013   Procedure: OPEN REDUCTION INTERNAL FIXATION (ORIF) PERIPROSTHETIC FRACTURE;  Surgeon: Mauri Pole, MD;  Location: [facility];  Service: Orthopedics;  Laterality: Right;  . SHOULDER SURGERY Left   . TOTAL HIP ARTHROPLASTY Right 04/10/2013   Procedure: RIGHT TOTAL HIP ARTHROPLASTY ANTERIOR APPROACH;  Surgeon: Mauri Pole, MD;  Location: [facility];  Service: Orthopedics;  Laterality: Right;  . TOTAL HIP ARTHROPLASTY     left hip     OB History    Gravida  2   Para  1   Term  1   Preterm      AB  1  Living  1     SAB  1   TAB      Ectopic      Multiple      Live Births               Home Medications    Prior to Admission medications   Medication Sig Start Date End Date Taking? Authorizing Provider  ibuprofen (ADVIL,MOTRIN) 200 MG tablet Take 200-400 mg by mouth every 6 (six) hours as needed for headache or mild pain.   Yes [provider]  albuterol (PROVENTIL HFA;VENTOLIN HFA) 108 (90 Base) MCG/ACT inhaler Inhale 2 puffs into the lungs every 4 (four) hours as needed for wheezing or  shortness of breath. Patient not taking: Reported on 12/04/2017 07/29/17   Muthersbaugh, Jarrett Soho, PA-C  pantoprazole (PROTONIX) 20 MG tablet Take 1 tablet (20 mg total) by mouth daily. Patient not taking: Reported on 12/04/2017 07/29/17   Muthersbaugh, Jarrett Soho, PA-C  Spacer/Aero-Holding Chambers (AEROCHAMBER PLUS WITH MASK) inhaler Use as instructed 07/29/17   Muthersbaugh, Jarrett Soho, PA-C    Family History Family History  Problem Relation Age of Onset  . Breast cancer Sister   . Diabetes Mother   . Kidney disease Mother   . Heart disease Father   . Colon cancer Father        questionable    Social History Social History   Tobacco Use  . Smoking status: Current Every Day Smoker    Packs/day: 0.50    Years: 25.00    Pack years: 12.50    Types: Cigarettes  . Smokeless tobacco: Never Used  . Tobacco comment: Tobacco info given to pt. 08/16/12  Substance Use Topics  . Alcohol use: Yes    Alcohol/week: 1.2 - 1.8 oz    Types: 2 - 3 Shots of liquor per week    Comment: pt quit drinking 2 months ago (today is 03/11/15)  . Drug use: No     Allergies   Morphine and related   Review of Systems Review of Systems  Constitutional: Negative for chills and fever.  Respiratory: Negative for shortness of breath.   Cardiovascular: Negative for chest pain.  Gastrointestinal: Negative for abdominal pain, nausea and vomiting.  Musculoskeletal: Negative for neck pain.  Skin: Negative for rash and wound.  Neurological: Negative for dizziness, syncope, weakness, numbness and headaches.  Psychiatric/Behavioral: Negative for suicidal ideas.  All other systems reviewed and are negative.    Physical Exam Updated Vital Signs BP 129/89 (BP Location: Left Arm)   Pulse 90   Temp 98.4 F (36.9 C) (Oral)   Resp 20   Ht 5\' 4"  (1.626 m)   Wt 54.4 kg (120 lb)   SpO2 98%   BMI 20.60 kg/m   Physical Exam  Constitutional: She is oriented to person, place, and time. She appears well-developed and  well-nourished.  HENT:  Head: Normocephalic and atraumatic.  Mouth/Throat: Oropharynx is clear and moist.  No obvious head trauma.  Eyes: Pupils are equal, round, and reactive to light. EOM are normal.  Neck: Normal range of motion. Neck supple.  Cardiovascular: Normal rate and regular rhythm.  Pulmonary/Chest: Effort normal and breath sounds normal.  Abdominal: Soft. Bowel sounds are normal. There is no tenderness. There is no rebound and no guarding.  Musculoskeletal: Normal range of motion. She exhibits no edema or tenderness.  Neurological: She is alert and oriented to person, place, and time.  Moves all extremities without deficit.  Sensation intact.  Mild slurring of words.  Skin: Skin is warm and dry. No rash noted. No erythema.  Psychiatric: She has a normal mood and affect. Her behavior is normal.  No SI.  Nursing note and vitals reviewed.    ED Treatments / Results  Labs (all labs ordered are listed, but only abnormal results are displayed) Labs Reviewed - No data to display  EKG None  Radiology No results found.  Procedures Procedures (including critical care time)  Medications Ordered in ED Medications - No data to display   Initial Impression / Assessment and Plan / ED Course  I have reviewed the triage vital signs and the nursing notes.  Pertinent labs & imaging results that were available during my care of the patient were reviewed by me and considered in my medical decision making (see chart for details).     Patient states she will attempt to get a ride.  Appears safe to discharge home with sober ride.  Final Clinical Impressions(s) / ED Diagnoses   Final diagnoses:  Alcoholic intoxication without complication [facility])    ED Discharge Orders    None       Julianne Rice, MD 12/13/17 0001

## 2017-12-12 NOTE — ED Triage Notes (Signed)
Pt BIB EMS from home. Son called out for questionable "breathing difficulties" ETOH on board.

## 2017-12-13 NOTE — ED Notes (Signed)
Awaiting pt ride for pt to be discharged.

## 2017-12-14 ENCOUNTER — Encounter: Payer: Self-pay | Admitting: *Deleted

## 2018-01-09 ENCOUNTER — Ambulatory Visit: Payer: Self-pay | Admitting: Internal Medicine

## 2018-01-24 ENCOUNTER — Other Ambulatory Visit (INDEPENDENT_AMBULATORY_CARE_PROVIDER_SITE_OTHER): Payer: Medicaid Other

## 2018-01-24 ENCOUNTER — Ambulatory Visit (INDEPENDENT_AMBULATORY_CARE_PROVIDER_SITE_OTHER): Payer: Medicaid Other | Admitting: Gastroenterology

## 2018-01-24 ENCOUNTER — Encounter: Payer: Self-pay | Admitting: Gastroenterology

## 2018-01-24 VITALS — BP 130/70 | HR 102 | Ht 63.0 in | Wt 115.0 lb

## 2018-01-24 DIAGNOSIS — F1010 Alcohol abuse, uncomplicated: Secondary | ICD-10-CM

## 2018-01-24 DIAGNOSIS — R1310 Dysphagia, unspecified: Secondary | ICD-10-CM | POA: Diagnosis not present

## 2018-01-24 DIAGNOSIS — R1011 Right upper quadrant pain: Secondary | ICD-10-CM | POA: Diagnosis not present

## 2018-01-24 DIAGNOSIS — R945 Abnormal results of liver function studies: Secondary | ICD-10-CM | POA: Diagnosis not present

## 2018-01-24 DIAGNOSIS — R7989 Other specified abnormal findings of blood chemistry: Secondary | ICD-10-CM

## 2018-01-24 LAB — COMPREHENSIVE METABOLIC PANEL
ALBUMIN: 4.6 g/dL (ref 3.5–5.2)
ALK PHOS: 96 U/L (ref 39–117)
ALT: 29 U/L (ref 0–35)
AST: 45 U/L — AB (ref 0–37)
BILIRUBIN TOTAL: 0.5 mg/dL (ref 0.2–1.2)
BUN: 7 mg/dL (ref 6–23)
CALCIUM: 9.3 mg/dL (ref 8.4–10.5)
CO2: 27 mEq/L (ref 19–32)
CREATININE: 0.49 mg/dL (ref 0.40–1.20)
Chloride: 99 mEq/L (ref 96–112)
GFR: 167.03 mL/min (ref >60.00)
Glucose, Bld: 104 mg/dL — ABNORMAL HIGH (ref 70–99)
Potassium: 3.1 mEq/L — ABNORMAL LOW (ref 3.5–5.1)
SODIUM: 138 meq/L (ref 135–145)
TOTAL PROTEIN: 7.8 g/dL (ref 6.0–8.3)

## 2018-01-24 LAB — CBC
HEMATOCRIT: 41.2 % (ref 36.0–46.0)
Hemoglobin: 14.2 g/dL (ref 12.0–15.0)
MCHC: 34.6 g/dL (ref 30.0–36.0)
MCV: 98.9 fl (ref 78.0–100.0)
Platelets: 211 10*3/uL (ref 150.0–400.0)
RBC: 4.17 Mil/uL (ref 3.87–5.11)
RDW: 15.3 % (ref 11.5–15.5)
WBC: 7.7 10*3/uL (ref 4.0–10.5)

## 2018-01-24 LAB — MAGNESIUM: Magnesium: 1.6 mg/dL (ref 1.5–2.5)

## 2018-01-24 LAB — TSH: TSH: 0.5 u[IU]/mL (ref 0.35–4.50)

## 2018-01-24 LAB — PROTIME-INR
INR: 1 ratio (ref 0.8–1.0)
Prothrombin Time: 12 s (ref 9.6–13.1)

## 2018-01-24 NOTE — Progress Notes (Addendum)
01/24/2018 Jaime Phillips [id number] 05/05/1961   HISTORY OF PRESENT ILLNESS: This is a 57 year old female who is known to Dr. [name].  She has not been here in over 2 years.  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  She continues to drink, but tells me she only drinks 2-3 airplane sized bottles every other day or so.  She is here today with complaints of right-sided abdominal pain.  She says that the pain comes and goes, often comes after she eats or at nighttime and sometimes she throws up after eating.  She is a poor historian and tells me that she thinks she is here to see if she is okay to have her gallbladder taken out, but unsure if she has even seen a Psychologist, sport and exercise.  She had an ultrasound in December 2018 that showed cholelithiasis.  She also reports intermittent issues with swallowing food.  Complaining of muscle spasms in her legs.  Does not have a PCP.  Has been to the ER on several occasions.  Last colonoscopy was March 2016 at which time she had moderate diverticulosis.  Last EGD was in August 2016 at which time she had moderately severe reflux esophagitis, 3 cm hiatal hernia, gastritis, duodenitis.  Gastric biopsies were normal, negative for H. pylori.  Esophageal biopsies showed inflammation consistent with reflux.   Past Medical History:  Diagnosis Date  . Abnormal uterine bleeding   . Alcoholism ([place])   . Arthritis   . Cholelithiasis   . Chronic pain syndrome   . Depression   . Diverticulosis   . Headache(784.0)   . Hiatal hernia   . Hyperplastic colon polyp   . Hypopotassemia   . Internal hemorrhoids   . LFT elevation   . Macrocytic anemia   . Mallory-Weiss tear   . Potassium (K) deficiency   . PUD (peptic ulcer disease)   . Rectal bleeding    minor    Past Surgical History:  Procedure Laterality Date  . APPENDECTOMY    . COLONOSCOPY    . ESOPHAGOGASTRODUODENOSCOPY N/A 02/06/2014   Procedure:  ESOPHAGOGASTRODUODENOSCOPY (EGD);  Surgeon: Milus Banister, MD;  Location: [facility];  Service: Endoscopy;  Laterality: N/A;  . OOPHORECTOMY    . ORIF HUMERUS FRACTURE Right 08/10/2016   Procedure: OPEN REDUCTION INTERNAL FIXATION (ORIF) RIGHT PROXIMAL HUMERUS FRACTURE;  Surgeon: Renette Butters, MD;  Location: [facility];  Service: Orthopedics;  Laterality: Right;  . ORIF PERIPROSTHETIC FRACTURE Right 05/28/2013   Procedure: OPEN REDUCTION INTERNAL FIXATION (ORIF) PERIPROSTHETIC FRACTURE;  Surgeon: Mauri Pole, MD;  [facility];  Service: Orthopedics;  Laterality: Right;  . SHOULDER SURGERY Left   . TOTAL HIP ARTHROPLASTY Right 04/10/2013   Procedure: RIGHT TOTAL HIP ARTHROPLASTY ANTERIOR APPROACH;  Surgeon: Mauri Pole, MD;  [facility];  Service: Orthopedics;  Laterality: Right;  . TOTAL HIP ARTHROPLASTY     left hip    reports that she has been smoking cigarettes.  She has a 12.50 pack-year smoking history. She has never used smokeless tobacco. She reports that she drinks about 1.2 - 1.8 oz of alcohol per week. She reports that she does not use drugs. family history includes Breast cancer in her sister; Colon cancer in her father; Diabetes in her mother; Heart disease in her father; Kidney disease in her mother. Allergies  Allergen Reactions  . Morphine And Related Nausea Only and Anxiety      Outpatient  Encounter Medications as of 01/24/2018  Medication Sig  . albuterol (PROVENTIL HFA;VENTOLIN HFA) 108 (90 Base) MCG/ACT inhaler Inhale 2 puffs into the lungs every 4 (four) hours as needed for wheezing or shortness of breath. (Patient not taking: Reported on 12/04/2017)  . ibuprofen (ADVIL,MOTRIN) 200 MG tablet Take 200-400 mg by mouth every 6 (six) hours as needed for headache or mild pain.  . pantoprazole (PROTONIX) 20 MG tablet Take 1 tablet (20 mg total) by mouth daily. (Patient not taking: Reported on 12/04/2017)  . Spacer/Aero-Holding Chambers (AEROCHAMBER PLUS WITH MASK)  inhaler Use as instructed   No facility-administered encounter medications on file as of 01/24/2018.      REVIEW OF SYSTEMS  : All other systems reviewed and negative except where noted in the History of Present Illness.   PHYSICAL EXAM: Ht 5\' 3"  (1.6 m)   Wt 115 lb (52.2 kg)   BMI 20.37 kg/m  General: Well developed black female in no acute distress Head: Normocephalic and atraumatic Eyes:  Sclerae anicteric, conjunctiva pink. Ears: Normal auditory acuity Lungs: Clear throughout to auscultation; no increased WOB. Heart: Regular rate and rhythm; no M/R/G. Abdomen: Soft, non-distended.  BS present.  Non-tender. Musculoskeletal: Symmetrical with no gross deformities  Skin: No lesions on visible extremities Extremities: No edema  Neurological: Alert oriented x 4, grossly non-focal Psychological:  Alert and cooperative. Normal mood and affect  ASSESSMENT AND PLAN: *RUQ abdominal pain:  Had gallstones previously.  May be symptomatic cholelithiasis but really hard to tell as she is a poor historian.  Will repeat abdominal ultrasound for now.  May need surgical evaluation. *Dysphagia:  Once again she is a poor historian.  Will check barium esophagram.  She declined EGD for now unless absolutely necessary. *ETOH abuse:  Has elevated LFT's.  Had F3-F4 fibrosis previously.  No obvious signs of cirrhosis by labs.  Will check CBC, CMP, TSH, PT/INR today. *Muscle cramps:  Will check potassium in CMP and will check mag level as well.  Can try taking a dose of magnesium at bedtime.  Needs to establish with a PCP, will stop on first floor on her way out to make an appt.  CC:  Medicine, [facility]*   Addendum: Reviewed and agree with management. Pyrtle, Lajuan Lines, MD

## 2018-01-24 NOTE — Patient Instructions (Addendum)
Please purchase the following medications over the counter and take as directed: Magnesium at bedtime  Go to the first floor and make an appointment with a primary care.   Your provider has requested that you go to the basement level for lab work before leaving today. Press "B" on the elevator. The lab is located at the first door on the left as you exit the elevator.   You have been scheduled for an abdominal ultrasound at [facility] Rad[facility] (1st floor of hospital) on 01-31-18 at 9:00 am. Please arrive 15 minutes prior to your appointment for registration. Make certain not to have anything to eat or drink 6 hours prior to your appointment. Should you need to reschedule your appointment, please contact radiology at [phone]. This test typically takes about 30 minutes to perform.  You have been scheduled for a Barium Esophogram at [facility] Rad[facility] (1st floor of the hospital) on 01-31-18 at 9:30 am. Please arrive 15 minutes prior to your appointment for registration. Make certain not to have anything to eat or drink 6 hours prior to your test. If you need to reschedule for any reason, please contact radiology at [phone] to do so. __________________________________________________________________ A barium swallow is an examination that concentrates on views of the esophagus. This tends to be a double contrast exam (barium and two liquids which, when combined, create a gas to distend the wall of the oesophagus) or single contrast (non-ionic iodine based). The study is usually tailored to your symptoms so a good history is essential. Attention is paid during the study to the form, structure and configuration of the esophagus, looking for functional disorders (such as aspiration, dysphagia, achalasia, motility and reflux) EXAMINATION You may be asked to change into a gown, depending on the type of swallow being performed. A radiologist and radiographer will perform the procedure. The  radiologist will advise you of the type of contrast selected for your procedure and direct you during the exam. You will be asked to stand, sit or lie in several different positions and to hold a small amount of fluid in your mouth before being asked to swallow while the imaging is performed .In some instances you may be asked to swallow barium coated marshmallows to assess the motility of a solid food bolus. The exam can be recorded as a digital or video fluoroscopy procedure. POST PROCEDURE It will take 1-2 days for the barium to pass through your system. To facilitate this, it is important, unless otherwise directed, to increase your fluids for the next 24-48hrs and to resume your normal diet.  This test typically takes about 30 minutes to perform. __________________________________________________________________________________

## 2018-01-25 ENCOUNTER — Encounter: Payer: Self-pay | Admitting: Gastroenterology

## 2018-01-25 DIAGNOSIS — R1011 Right upper quadrant pain: Secondary | ICD-10-CM | POA: Insufficient documentation

## 2018-01-26 ENCOUNTER — Other Ambulatory Visit: Payer: Self-pay

## 2018-01-26 DIAGNOSIS — E876 Hypokalemia: Secondary | ICD-10-CM

## 2018-01-31 ENCOUNTER — Telehealth: Payer: Self-pay | Admitting: Gastroenterology

## 2018-01-31 ENCOUNTER — Ambulatory Visit (HOSPITAL_COMMUNITY)
Admission: RE | Admit: 2018-01-31 | Discharge: 2018-01-31 | Disposition: A | Discharge status: Home / Self Care | Payer: Medicaid Other | Source: Ambulatory Visit | Attending: Gastroenterology | Admitting: Gastroenterology

## 2018-01-31 DIAGNOSIS — R1310 Dysphagia, unspecified: Secondary | ICD-10-CM | POA: Insufficient documentation

## 2018-01-31 DIAGNOSIS — K209 Esophagitis, unspecified: Secondary | ICD-10-CM | POA: Insufficient documentation

## 2018-01-31 DIAGNOSIS — F1010 Alcohol abuse, uncomplicated: Secondary | ICD-10-CM | POA: Diagnosis not present

## 2018-01-31 DIAGNOSIS — R1011 Right upper quadrant pain: Secondary | ICD-10-CM | POA: Diagnosis not present

## 2018-01-31 NOTE — Telephone Encounter (Signed)
The patient has been notified of this information and all questions answered. She will come in tomorrow for repeat labs

## 2018-02-19 ENCOUNTER — Emergency Department (HOSPITAL_COMMUNITY): Payer: Medicaid Other

## 2018-02-19 ENCOUNTER — Emergency Department (HOSPITAL_COMMUNITY)
Admission: EM | Admit: 2018-02-19 | Discharge: 2018-02-19 | Disposition: A | Discharge status: Home / Self Care | Payer: Medicaid Other | Attending: Emergency Medicine | Admitting: Emergency Medicine

## 2018-02-19 ENCOUNTER — Encounter (HOSPITAL_COMMUNITY): Payer: Self-pay | Admitting: Emergency Medicine

## 2018-02-19 DIAGNOSIS — F332 Major depressive disorder, recurrent severe without psychotic features: Secondary | ICD-10-CM | POA: Diagnosis not present

## 2018-02-19 DIAGNOSIS — F1994 Other psychoactive substance use, unspecified with psychoactive substance-induced mood disorder: Secondary | ICD-10-CM | POA: Insufficient documentation

## 2018-02-19 DIAGNOSIS — R45851 Suicidal ideations: Secondary | ICD-10-CM | POA: Diagnosis not present

## 2018-02-19 DIAGNOSIS — S0990XA Unspecified injury of head, initial encounter: Secondary | ICD-10-CM

## 2018-02-19 DIAGNOSIS — S29001A Unspecified injury of muscle and tendon of front wall of thorax, initial encounter: Secondary | ICD-10-CM | POA: Diagnosis not present

## 2018-02-19 DIAGNOSIS — Z79899 Other long term (current) drug therapy: Secondary | ICD-10-CM | POA: Diagnosis not present

## 2018-02-19 DIAGNOSIS — F1099 Alcohol use, unspecified with unspecified alcohol-induced disorder: Secondary | ICD-10-CM | POA: Insufficient documentation

## 2018-02-19 DIAGNOSIS — F17210 Nicotine dependence, cigarettes, uncomplicated: Secondary | ICD-10-CM | POA: Insufficient documentation

## 2018-02-19 DIAGNOSIS — F1094 Alcohol use, unspecified with alcohol-induced mood disorder: Secondary | ICD-10-CM | POA: Diagnosis present

## 2018-02-19 DIAGNOSIS — Y939 Activity, unspecified: Secondary | ICD-10-CM | POA: Diagnosis not present

## 2018-02-19 DIAGNOSIS — Y999 Unspecified external cause status: Secondary | ICD-10-CM | POA: Diagnosis not present

## 2018-02-19 DIAGNOSIS — S0181XA Laceration without foreign body of other part of head, initial encounter: Secondary | ICD-10-CM | POA: Diagnosis not present

## 2018-02-19 DIAGNOSIS — Y929 Unspecified place or not applicable: Secondary | ICD-10-CM | POA: Insufficient documentation

## 2018-02-19 DIAGNOSIS — Z23 Encounter for immunization: Secondary | ICD-10-CM | POA: Insufficient documentation

## 2018-02-19 DIAGNOSIS — I10 Essential (primary) hypertension: Secondary | ICD-10-CM | POA: Diagnosis not present

## 2018-02-19 DIAGNOSIS — F1010 Alcohol abuse, uncomplicated: Secondary | ICD-10-CM

## 2018-02-19 DIAGNOSIS — S298XXA Other specified injuries of thorax, initial encounter: Secondary | ICD-10-CM

## 2018-02-19 LAB — CBG MONITORING, ED: GLUCOSE-CAPILLARY: 68 mg/dL — AB (ref 70–99)

## 2018-02-19 LAB — RAPID URINE DRUG SCREEN, HOSP PERFORMED
Amphetamines: NOT DETECTED
BENZODIAZEPINES: NOT DETECTED
Barbiturates: NOT DETECTED
COCAINE: NOT DETECTED
Opiates: NOT DETECTED
Tetrahydrocannabinol: NOT DETECTED

## 2018-02-19 LAB — COMPREHENSIVE METABOLIC PANEL
ALBUMIN: 3.9 g/dL (ref 3.5–5.0)
ALT: 59 U/L — AB (ref 0–44)
AST: 154 U/L — ABNORMAL HIGH (ref 15–41)
Alkaline Phosphatase: 105 U/L (ref 38–126)
Anion gap: 15 (ref 5–15)
BUN: 17 mg/dL (ref 6–20)
CO2: 18 mmol/L — ABNORMAL LOW (ref 22–32)
CREATININE: 0.68 mg/dL (ref 0.44–1.00)
Calcium: 8.4 mg/dL — ABNORMAL LOW (ref 8.9–10.3)
Chloride: 106 mmol/L (ref 98–111)
GFR calc Af Amer: >60 mL/min (ref >60)
GFR calc non Af Amer: >60 mL/min (ref >60)
Glucose, Bld: 225 mg/dL — ABNORMAL HIGH (ref 70–99)
Potassium: 3.5 mmol/L (ref 3.5–5.1)
SODIUM: 139 mmol/L (ref 135–145)
Total Bilirubin: 0.4 mg/dL (ref 0.3–1.2)
Total Protein: 7.5 g/dL (ref 6.5–8.1)

## 2018-02-19 LAB — CBC
HCT: 43.1 % (ref 36.0–46.0)
Hemoglobin: 14.1 g/dL (ref 12.0–15.0)
MCH: 34.1 pg — ABNORMAL HIGH (ref 26.0–34.0)
MCHC: 32.7 g/dL (ref 30.0–36.0)
MCV: 104.1 fL — ABNORMAL HIGH (ref 78.0–100.0)
PLATELETS: 293 10*3/uL (ref 150–400)
RBC: 4.14 MIL/uL (ref 3.87–5.11)
RDW: 13.6 % (ref 11.5–15.5)
WBC: 9 10*3/uL (ref 4.0–10.5)

## 2018-02-19 LAB — HCG, QUANTITATIVE, PREGNANCY: hCG, Beta Chain, Quant, S: 2 m[IU]/mL (ref <5)

## 2018-02-19 LAB — ETHANOL: Alcohol, Ethyl (B): 273 mg/dL — ABNORMAL HIGH (ref <10)

## 2018-02-19 MED ORDER — VITAMIN B-1 100 MG PO TABS
100.0000 mg | ORAL_TABLET | Freq: Every day | ORAL | Status: DC
  Administered 2018-02-19: 100 mg via ORAL
  Filled 2018-02-19: qty 1

## 2018-02-19 MED ORDER — LORAZEPAM 2 MG/ML IJ SOLN: 0.0000 mg | Freq: Two times a day (BID) | INTRAMUSCULAR | Status: DC

## 2018-02-19 MED ORDER — LORAZEPAM 1 MG PO TABS: 0.0000 mg | ORAL_TABLET | Freq: Four times a day (QID) | ORAL | Status: DC

## 2018-02-19 MED ORDER — LORAZEPAM 2 MG/ML IJ SOLN: 0.0000 mg | Freq: Four times a day (QID) | INTRAMUSCULAR | Status: DC

## 2018-02-19 MED ORDER — TETANUS-DIPHTH-ACELL PERTUSSIS 5-2.5-18.5 LF-MCG/0.5 IM SUSP
0.5000 mL | Freq: Once | INTRAMUSCULAR | Status: AC
  Administered 2018-02-19: 0.5 mL via INTRAMUSCULAR
  Filled 2018-02-19: qty 0.5

## 2018-02-19 MED ORDER — THIAMINE HCL 100 MG/ML IJ SOLN: 100.0000 mg | Freq: Every day | INTRAMUSCULAR | Status: DC

## 2018-02-19 MED ORDER — LIDOCAINE-EPINEPHRINE-TETRACAINE (LET) SOLUTION
3.0000 mL | Freq: Once | NASAL | Status: AC
  Administered 2018-02-19: 3 mL via TOPICAL
  Filled 2018-02-19: qty 3

## 2018-02-19 MED ORDER — LORAZEPAM 1 MG PO TABS: 0.0000 mg | ORAL_TABLET | Freq: Two times a day (BID) | ORAL | Status: DC

## 2018-02-19 MED ORDER — ALBUTEROL SULFATE HFA 108 (90 BASE) MCG/ACT IN AERS: 2.0000 | INHALATION_SPRAY | RESPIRATORY_TRACT | Status: DC | PRN

## 2018-02-19 MED ORDER — GABAPENTIN 300 MG PO CAPS
300.0000 mg | ORAL_CAPSULE | Freq: Two times a day (BID) | ORAL | Status: DC
  Administered 2018-02-19: 300 mg via ORAL
  Filled 2018-02-19: qty 1

## 2018-02-19 NOTE — Patient Outreach (Signed)
[facility] met with the patient to provide substance use support and help with recovery resources. Patient informed [facility] that she does not want help with any recovery resources at this time for her alcohol use. [facility] still provided [facility] contact information. [facility] informed the patient on how [facility] works with patients both in the [facility] and in their community setting after discharge.  [facility] strongly encouraged the patient to reach out to [facility] at anytime if she believes her alcohol use becomes unmanageable and wants help with substance use recovery resources.
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
## 2018-02-19 NOTE — ED Notes (Signed)
Bed: WA29 Expected date:  Expected time:  Means of arrival:  Comments: IVC

## 2018-02-19 NOTE — Progress Notes (Signed)
TTS consulted with Patriciaann Clan, PA who recommends inpt treatment. EDP Ripley Fraise, MD and pt's nurse Rimando, Junior Jones Bales, RN has been advised of the disposition. TTS to seek placement.  Lind Covert, MSW, LCSW Therapeutic Triage Specialist  [phone]

## 2018-02-19 NOTE — Progress Notes (Signed)
TTS spoke with [place], Junior Jones Bales, RN and advised pt needs her blood sugar checked in order for [facility] to continue reviewing the pt for possible admission.  Lind Covert, MSW, LCSW Therapeutic Triage Specialist  [phone]

## 2018-02-19 NOTE — Progress Notes (Signed)
[facility] reviewing for possible admission.  Lind Covert, MSW, LCSW Therapeutic Triage Specialist  [phone]

## 2018-02-19 NOTE — ED Triage Notes (Signed)
Brought in by [employer] from home. Per [employer] pt stated she wants to kill herself. Per [employer] pt is alcohol intoxicated and uncooperative. Patient IVC'd by her son.
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
## 2018-03-01 ENCOUNTER — Other Ambulatory Visit: Payer: Self-pay

## 2018-03-01 MED ORDER — PANTOPRAZOLE SODIUM 20 MG PO TBEC: 20.0000 mg | DELAYED_RELEASE_TABLET | Freq: Every day | ORAL | 3 refills | Status: AC

## 2018-06-27 ENCOUNTER — Emergency Department (HOSPITAL_COMMUNITY)
Admission: EM | Admit: 2018-06-27 | Discharge: 2018-06-27 | Disposition: A | Discharge status: Home / Self Care | Payer: Medicaid Other | Attending: Emergency Medicine | Admitting: Emergency Medicine

## 2018-06-27 ENCOUNTER — Emergency Department (HOSPITAL_COMMUNITY): Payer: Medicaid Other

## 2018-06-27 ENCOUNTER — Encounter (HOSPITAL_COMMUNITY): Payer: Self-pay

## 2018-06-27 DIAGNOSIS — A599 Trichomoniasis, unspecified: Secondary | ICD-10-CM | POA: Diagnosis not present

## 2018-06-27 DIAGNOSIS — F17210 Nicotine dependence, cigarettes, uncomplicated: Secondary | ICD-10-CM | POA: Diagnosis not present

## 2018-06-27 DIAGNOSIS — N950 Postmenopausal bleeding: Secondary | ICD-10-CM | POA: Insufficient documentation

## 2018-06-27 DIAGNOSIS — N939 Abnormal uterine and vaginal bleeding, unspecified: Secondary | ICD-10-CM

## 2018-06-27 DIAGNOSIS — Z79899 Other long term (current) drug therapy: Secondary | ICD-10-CM | POA: Insufficient documentation

## 2018-06-27 DIAGNOSIS — R10815 Periumbilic abdominal tenderness: Secondary | ICD-10-CM | POA: Diagnosis not present

## 2018-06-27 LAB — COMPREHENSIVE METABOLIC PANEL
ALBUMIN: 4.2 g/dL (ref 3.5–5.0)
ALK PHOS: 64 U/L (ref 38–126)
ALT: 21 U/L (ref 0–44)
ANION GAP: 8 (ref 5–15)
AST: 35 U/L (ref 15–41)
BUN: 14 mg/dL (ref 6–20)
CALCIUM: 9.3 mg/dL (ref 8.9–10.3)
CO2: 28 mmol/L (ref 22–32)
Chloride: 102 mmol/L (ref 98–111)
Creatinine, Ser: 0.74 mg/dL (ref 0.44–1.00)
GFR calc Af Amer: >60 mL/min (ref >60)
GFR calc non Af Amer: >60 mL/min (ref >60)
Glucose, Bld: 104 mg/dL — ABNORMAL HIGH (ref 70–99)
Potassium: 4.2 mmol/L (ref 3.5–5.1)
SODIUM: 138 mmol/L (ref 135–145)
TOTAL PROTEIN: 7.5 g/dL (ref 6.5–8.1)
Total Bilirubin: 0.7 mg/dL (ref 0.3–1.2)

## 2018-06-27 LAB — LIPASE, BLOOD: Lipase: 31 U/L (ref 11–51)

## 2018-06-27 LAB — URINALYSIS, ROUTINE W REFLEX MICROSCOPIC
BILIRUBIN URINE: NEGATIVE
Bacteria, UA: NONE SEEN
GLUCOSE, UA: NEGATIVE mg/dL
KETONES UR: 5 mg/dL — AB
NITRITE: NEGATIVE
PH: 6 (ref 5.0–8.0)
Protein, ur: 30 mg/dL — AB
SPECIFIC GRAVITY, URINE: 1.029 (ref 1.005–1.030)

## 2018-06-27 LAB — CBC
HCT: 40.9 % (ref 36.0–46.0)
Hemoglobin: 13 g/dL (ref 12.0–15.0)
MCH: 32.7 pg (ref 26.0–34.0)
MCHC: 31.8 g/dL (ref 30.0–36.0)
MCV: 103 fL — AB (ref 80.0–100.0)
PLATELETS: 281 10*3/uL (ref 150–400)
RBC: 3.97 MIL/uL (ref 3.87–5.11)
RDW: 13.9 % (ref 11.5–15.5)
WBC: 8.1 10*3/uL (ref 4.0–10.5)
nRBC: 0 % (ref 0.0–0.2)

## 2018-06-27 LAB — WET PREP, GENITAL
Sperm: NONE SEEN
Yeast Wet Prep HPF POC: NONE SEEN

## 2018-06-27 LAB — I-STAT BETA HCG BLOOD, ED (MC, WL, AP ONLY): I-stat hCG, quantitative: <5 m[IU]/mL (ref <5)

## 2018-06-27 MED ORDER — AZITHROMYCIN 250 MG PO TABS: 1000.0000 mg | ORAL_TABLET | Freq: Once | ORAL | 0 refills | Status: AC

## 2018-06-27 MED ORDER — CEFTRIAXONE SODIUM 250 MG IJ SOLR
250.0000 mg | Freq: Once | INTRAMUSCULAR | Status: AC
  Administered 2018-06-27: 250 mg via INTRAMUSCULAR
  Filled 2018-06-27: qty 250

## 2018-06-27 MED ORDER — METRONIDAZOLE 500 MG PO TABS
2000.0000 mg | ORAL_TABLET | Freq: Once | ORAL | Status: AC
  Administered 2018-06-27: 2000 mg via ORAL
  Filled 2018-06-27: qty 4

## 2018-06-27 MED ORDER — STERILE WATER FOR INJECTION IJ SOLN
INTRAMUSCULAR | Status: AC
  Administered 2018-06-27: 10 mL
  Filled 2018-06-27: qty 10

## 2018-06-27 MED ORDER — AZITHROMYCIN 250 MG PO TABS
1000.0000 mg | ORAL_TABLET | Freq: Once | ORAL | Status: AC
  Administered 2018-06-27: 1000 mg via ORAL
  Filled 2018-06-27: qty 4

## 2018-06-27 NOTE — ED Triage Notes (Signed)
Pt presents with c/o vaginal bleeding, heavy in nature. Pt reports that she has not had a period in 10-15 years and all of a sudden started bleeding last week. Pt c/o pain in her lower abdomen as well as her back. Pt also reports that she would like to be tested for an STD as she was experiencing some vaginal discharge prior to the bleeding.

## 2018-06-27 NOTE — Discharge Instructions (Signed)
Please read and follow all provided instructions.  Your diagnoses today include:  1. Vaginal bleeding   2. Trichomoniasis     Tests performed today include:  Blood counts and electrolytes  Blood tests to check liver and kidney function  Urine test to look for infection  Wet prep -shows an infection called trichomoniasis  Ultrasound -shows a probable uterine polyp which is likely the reason for your bleeding, however will need to be closely followed up by gynecology as we discussed  Vital signs. See below for your results today.   Medications prescribed:   Azithromycin - antibiotic for respiratory infection  You have been prescribed an antibiotic medicine: take the entire course of medicine even if you are feeling better. Stopping early can cause the antibiotic not to work.  Take any prescribed medications only as directed.  Home care instructions:   Follow any educational materials contained in this packet.  Follow-up instructions: Please follow-up with the gynecology referral as soon as you can.  Return instructions:  SEEK IMMEDIATE MEDICAL ATTENTION IF:  The pain does not go away or becomes severe   A temperature above 101F develops   Repeated vomiting occurs (multiple episodes)   The pain becomes localized to portions of the abdomen. The right side could possibly be appendicitis. In an adult, the left lower portion of the abdomen could be colitis or diverticulitis.   Blood is being passed in stools or vomit (bright red or black tarry stools)   You develop chest pain, difficulty breathing, dizziness or fainting, or become confused, poorly responsive, or inconsolable (young children)  If you have any other emergent concerns regarding your health  Additional Information: Abdominal (belly) pain can be caused by many things. Your caregiver performed an examination and possibly ordered blood/urine tests and imaging (CT scan, x-rays, ultrasound). Many cases can be  observed and treated at home after initial evaluation in the emergency department. Even though you are being discharged home, abdominal pain can be unpredictable. Therefore, you need a repeated exam if your pain does not resolve, returns, or worsens. Most patients with abdominal pain don't have to be admitted to the hospital or have surgery, but serious problems like appendicitis and gallbladder attacks can start out as nonspecific pain. Many abdominal conditions cannot be diagnosed in one visit, so follow-up evaluations are very important.  Your vital signs today were: BP (!) 162/98 (BP Location: Right Arm)    Pulse 100    Temp 98.7 F (37.1 C) (Oral)    Resp 18    SpO2 100%  If your blood pressure (bp) was elevated above 135/85 this visit, please have this repeated by your doctor within one month. --------------

## 2018-06-27 NOTE — ED Provider Notes (Signed)
[facility] DEPT Provider Note   CSN: [id number] Arrival date & time: 06/27/18  1230     History   Chief Complaint Chief Complaint  Patient presents with  . Abdominal Pain  . Vaginal Bleeding    HPI Jaime Phillips is a 57 y.o. female.  Patient with history of alcoholism, cholelithiasis --presents to the emergency department today with 5 days of abdominal cramping with radiation to the back and vaginal bleeding which has become progressively heavier.  Patient states that she has not had a menstrual period in about 10 years however her symptoms remind her of menstrual cramps.  In addition, she is worried about sexually transmitted infection.  She states that she was having some discharge prior to the bleeding beginning and that she is sexually active.  She denies any fevers, nausea, vomiting, or diarrhea.  She denies any easy bruising or bleeding, blood in stool or urine.  She does not take any anticoagulation.  She is not currently have a primary care doctor.  She states that she drinks alcohol daily.      Past Medical History:  Diagnosis Date  . Abnormal uterine bleeding   . Alcoholism ([place])   . Arthritis   . Cholelithiasis   . Chronic pain syndrome   . Depression   . Diverticulosis   . Headache(784.0)   . Hiatal hernia   . Hyperplastic colon polyp   . Hypopotassemia   . Internal hemorrhoids   . LFT elevation   . Macrocytic anemia   . Mallory-Weiss tear   . Potassium (K) deficiency   . PUD (peptic ulcer disease)   . Rectal bleeding    minor     Patient Active Problem List   Diagnosis Date Noted  . RUQ abdominal pain 01/25/2018  . Humeral head fracture, right, closed, initial encounter 08/08/2016  . Hyponatremia 08/08/2016  . Acute URI 08/08/2016  . Closed fracture of right proximal humerus   . Cause of injury, fall   . Chest pain 08/07/2016  . 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 Substance induced mood disorder  ([place]) 11/03/2015  . ETOH abuse   . Alcohol use disorder, severe, dependence ([place]) 10/03/2015  . 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 Ankle edema 03/17/2015  . Cirrhosis ([place]) 02/24/2015  . Black stools 02/24/2015  . Abdominal pain, epigastric 01/29/2015  . Nausea with vomiting 01/29/2015  . 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 Dysphagia 01/29/2015  . Hypokalemia 12/19/2014  . Hypomagnesemia 12/19/2014  . Malnutrition of moderate degree ([place]) 12/18/2014  . Alcoholic ketoacidosis 99/83/3825  . Alcohol abuse, in remission 02/14/2014  . Elevated LFTs 02/14/2014  . Essential hypertension, benign 02/14/2014  . Smoking 02/14/2014  . Upper GI bleed 02/06/2014  . Chronic pain syndrome 02/06/2014  . Gastro-esophageal reflux 02/06/2014  . Dental caries 01/17/2014  . Periprosthetic fracture of hip 05/31/2013  . Hip fracture ([place]) 05/27/2013  . Normocytic anemia 04/11/2013  . S/P right THA, AA 04/10/2013    Past Surgical History:  Procedure Laterality Date  . APPENDECTOMY    . COLONOSCOPY    . ESOPHAGOGASTRODUODENOSCOPY N/A 02/06/2014   Procedure: ESOPHAGOGASTRODUODENOSCOPY (EGD);  Surgeon: Milus Banister, MD;  Location: [facility];  Service: Endoscopy;  Laterality: N/A;  . OOPHORECTOMY    . ORIF HUMERUS FRACTURE Right 08/10/2016   Procedure: OPEN REDUCTION INTERNAL FIXATION (ORIF) RIGHT PROXIMAL HUMERUS FRACTURE;  Surgeon: Renette Butters, MD;  Location: [facility];  Service: Orthopedics;  Laterality: Right;  . ORIF  PERIPROSTHETIC FRACTURE Right 05/28/2013   Procedure: OPEN REDUCTION INTERNAL FIXATION (ORIF) PERIPROSTHETIC FRACTURE;  Surgeon: Mauri Pole, MD;  Location: [facility];  Service: Orthopedics;  Laterality: Right;  . SHOULDER SURGERY Left   . TOTAL HIP ARTHROPLASTY Right 04/10/2013   Procedure: RIGHT TOTAL HIP ARTHROPLASTY ANTERIOR APPROACH;  Surgeon: Mauri Pole, MD;  Location: [facility];  Service: Orthopedics;  Laterality: Right;  . TOTAL HIP ARTHROPLASTY      left hip     OB History    Gravida  2   Para  1   Term  1   Preterm      AB  1   Living  1     SAB  1   TAB      Ectopic      Multiple      Live Births               Home Medications    Prior to Admission medications   Medication Sig Start Date End Date Taking? Authorizing Provider  albuterol (PROVENTIL HFA;VENTOLIN HFA) 108 (90 Base) MCG/ACT inhaler Inhale 2 puffs into the lungs every 4 (four) hours as needed for wheezing or shortness of breath. 07/29/17   Muthersbaugh, Jarrett Soho, PA-C  ibuprofen (ADVIL,MOTRIN) 200 MG tablet Take 200-400 mg by mouth every 6 (six) hours as needed for headache or mild pain.    [provider]  pantoprazole (PROTONIX) 20 MG tablet Take 1 tablet (20 mg total) by mouth daily. 03/01/18   Zehr, Laban Emperor, PA-C  Spacer/Aero-Holding Chambers (AEROCHAMBER PLUS WITH MASK) inhaler Use as instructed 07/29/17   Muthersbaugh, Jarrett Soho, PA-C    Family History Family History  Problem Relation Age of Onset  . Breast cancer Sister   . Diabetes Mother   . Kidney disease Mother   . Heart disease Father   . Colon cancer Father        questionable    Social History Social History   Tobacco Use  . Smoking status: Current Every Day Smoker    Packs/day: 0.50    Years: 25.00    Pack years: 12.50    Types: Cigarettes  . Smokeless tobacco: Never Used  . Tobacco comment: Tobacco info given to pt. 08/16/12  Substance Use Topics  . Alcohol use: Yes    Alcohol/week: 2.0 - 3.0 standard drinks    Types: 2 - 3 Shots of liquor per week    Comment: pt quit drinking 2 months ago (today is 03/11/15)  . Drug use: No     Allergies   Morphine and related   Review of Systems Review of Systems  Constitutional: Negative for fever.  HENT: Negative for rhinorrhea and sore throat.   Eyes: Negative for redness.  Respiratory: Negative for cough.   Cardiovascular: Negative for chest pain.  Gastrointestinal: Positive for abdominal pain. Negative for  diarrhea, nausea and vomiting.  Genitourinary: Positive for vaginal bleeding and vaginal discharge. Negative for dysuria.  Musculoskeletal: Positive for back pain. Negative for myalgias.  Skin: Negative for rash.     Physical Exam Updated Vital Signs BP (!) 150/88 (BP Location: Left Arm)   Pulse 97   Temp 98.7 F (37.1 C) (Oral)   Resp 18   SpO2 97%   Physical Exam  Constitutional: She appears well-developed and well-nourished.  HENT:  Head: Normocephalic and atraumatic.  Eyes: Conjunctivae are normal. Right eye exhibits no discharge. Left eye exhibits no discharge.  Neck: Normal range of motion. Neck supple.  Cardiovascular: Normal rate, regular rhythm and normal heart sounds.  Pulmonary/Chest: Effort normal and breath sounds normal.  Abdominal: Soft. There is tenderness in the suprapubic area.  Genitourinary: Vagina normal. Pelvic exam was performed with patient supine. There is no rash, tenderness or lesion on the right labia. There is no rash, tenderness or lesion on the left labia. Uterus is tender (mild). Uterus is not enlarged. Cervix exhibits motion tenderness (mild) and discharge (mild). Right adnexum displays tenderness. Right adnexum displays no mass and no fullness. Left adnexum displays tenderness. Left adnexum displays no mass and no fullness. No erythema or bleeding in the vagina.  Neurological: She is alert.  Skin: Skin is warm and dry.  Psychiatric: She has a normal mood and affect.  Nursing note and vitals reviewed.    ED Treatments / Results  Labs (all labs ordered are listed, but only abnormal results are displayed) Labs Reviewed  WET PREP, GENITAL - Abnormal; Notable for the following components:      Result Value   Trich, Wet Prep PRESENT (*)    Clue Cells Wet Prep HPF POC PRESENT (*)    WBC, Wet Prep HPF POC MANY (*)    All other components within normal limits  COMPREHENSIVE METABOLIC PANEL - Abnormal; Notable for the following components:    Glucose, Bld 104 (*)    All other components within normal limits  CBC - Abnormal; Notable for the following components:   MCV 103.0 (*)    All other components within normal limits  URINALYSIS, ROUTINE W REFLEX MICROSCOPIC - Abnormal; Notable for the following components:   APPearance HAZY (*)    Hgb urine dipstick SMALL (*)    Ketones, ur 5 (*)    Protein, ur 30 (*)    Leukocytes, UA MODERATE (*)    All other components within normal limits  LIPASE, BLOOD  RPR  HIV ANTIBODY (ROUTINE TESTING W REFLEX)  I-STAT BETA HCG BLOOD, ED ([facility], [facility], [facility] ONLY)  GC/CHLAMYDIA PROBE AMP ([place]) NOT AT [facility]    EKG None  Radiology US Transvaginal Non-ob  Result Date: 06/27/2018 CLINICAL DATA:  Pelvic pain and vaginal bleeding. EXAM: TRANSABDOMINAL AND TRANSVAGINAL ULTRASOUND OF PELVIS TECHNIQUE: Both transabdominal and transvaginal ultrasound examinations of the pelvis were performed. Transabdominal technique was performed for global imaging of the pelvis including uterus, ovaries, adnexal regions, and pelvic cul-de-sac. It was necessary to proceed with endovaginal exam following the transabdominal exam to visualize the adnexa. COMPARISON:  CT abdomen pelvis dated July 28, 2012. FINDINGS: Uterus Measurements: 5.1 x 2.1 x 3.3 cm = volume: 18 mL mL. Small 6 mm intramural fibroid in the posterior fundus. Endometrium Thickness: 3 mm. Small amount of fluid within the endometrial canal with 3 mm hypoechoic lesion. Right ovary Measurements: 1.7 x 0.9 x 1.1 cm = volume: 1 mL. Normal appearance/no adnexal mass. Left ovary Surgically absent. Other findings No abnormal free fluid. IMPRESSION: 1. 3 mm hypoechoic lesion in the endometrial canal, potentially a polyp. In the setting of post-menopausal bleeding, endometrial sampling is indicated to exclude carcinoma. If results are benign, sonohysterogram should be considered for focal lesion work-up prior to hysteroscopy. (Ref: Radiological Reasoning: Algorithmic  Workup of Abnormal Vaginal Bleeding with Endovaginal Sonography and Sonohysterography. AJR 2008; 622:W97-98) 2. Small 6 mm uterine intramural fibroid. Electronically Signed   By: Titus Dubin M.D.   On: 06/27/2018 17:21   US Pelvis Complete  Result Date: 06/27/2018 CLINICAL DATA:  Pelvic pain and vaginal bleeding. EXAM: TRANSABDOMINAL AND TRANSVAGINAL  ULTRASOUND OF PELVIS TECHNIQUE: Both transabdominal and transvaginal ultrasound examinations of the pelvis were performed. Transabdominal technique was performed for global imaging of the pelvis including uterus, ovaries, adnexal regions, and pelvic cul-de-sac. It was necessary to proceed with endovaginal exam following the transabdominal exam to visualize the adnexa. COMPARISON:  CT abdomen pelvis dated July 28, 2012. FINDINGS: Uterus Measurements: 5.1 x 2.1 x 3.3 cm = volume: 18 mL mL. Small 6 mm intramural fibroid in the posterior fundus. Endometrium Thickness: 3 mm. Small amount of fluid within the endometrial canal with 3 mm hypoechoic lesion. Right ovary Measurements: 1.7 x 0.9 x 1.1 cm = volume: 1 mL. Normal appearance/no adnexal mass. Left ovary Surgically absent. Other findings No abnormal free fluid. IMPRESSION: 1. 3 mm hypoechoic lesion in the endometrial canal, potentially a polyp. In the setting of post-menopausal bleeding, endometrial sampling is indicated to exclude carcinoma. If results are benign, sonohysterogram should be considered for focal lesion work-up prior to hysteroscopy. (Ref: Radiological Reasoning: Algorithmic Workup of Abnormal Vaginal Bleeding with Endovaginal Sonography and Sonohysterography. AJR 2008; 220:U54-27) 2. Small 6 mm uterine intramural fibroid. Electronically Signed   By: Titus Dubin M.D.   On: 06/27/2018 17:21    Procedures Procedures (including critical care time)  Medications Ordered in ED Medications - No data to display   Initial Impression / Assessment and Plan / ED Course  I have reviewed the  triage vital signs and the nursing notes.  Pertinent labs & imaging results that were available during my care of the patient were reviewed by me and considered in my medical decision making (see chart for details).     Patient seen and examined. Work-up initiated.  Vital signs reviewed and are as follows: BP (!) 150/88 (BP Location: Left Arm)   Pulse 97   Temp 98.7 F (37.1 C) (Oral)   Resp 18   SpO2 97%   Pelvic exam performed with NT chaperone. [place] ordered.   Medications ordered.   7:07 PM ultrasound shows what appears to be a polyp in the uterus.  Patient updated on results.  Wet prep positive for trichomoniasis.  Metronidazole added to treatment regimen.  Patient has also received Rocephin and azithromycin.  She currently lives in a shelter and will not be able to obtain any medications until after December 1.  She is given a second dose of azithromycin, 1 g, to take in 1 week.  This will cover her for PID.  She was given 2 g of metronidazole here to treat trichomoniasis.  Patient is aware that she needs to abstain from alcohol for the next 48 to 72 hours to prevent side effects from metronidazole.  Otherwise, she understands the importance of following up with [facility] for further evaluation and to rule out endometrial cancer.  Discussed that she should return with new symptoms, fever, uncontrolled pain, any other concerns.  She verbalizes understanding agrees with plan.   Final Clinical Impressions(s) / ED Diagnoses   Final diagnoses:  Vaginal bleeding  Trichomoniasis   Patient with abnormal vaginal bleeding.  She is postmenopausal.  No active bleeding noted on exam.  Patient has trichomonas infection.  She was tested for gonorrhea, chlamydia, syphilis and HIV.  She was treated with Rocephin, azithromycin, metronidazole as above.  Do not feel that patient requires further evaluation at this time but will need to follow-up closely with GYN to rule out endometrial  cancer.  ED Discharge Orders    None       Reilley Valentine,  Jinny Sanders 06/27/18 Pauline Aus    Isla Pence, MD 06/29/18 [id number]

## 2018-06-28 LAB — GC/CHLAMYDIA PROBE AMP (~~LOC~~) NOT AT ARMC
CHLAMYDIA, DNA PROBE: NEGATIVE
NEISSERIA GONORRHEA: NEGATIVE

## 2018-06-28 LAB — RPR: RPR: NONREACTIVE

## 2018-06-28 LAB — HIV ANTIBODY (ROUTINE TESTING W REFLEX): HIV Screen 4th Generation wRfx: NONREACTIVE

## 2018-07-08 ENCOUNTER — Emergency Department (HOSPITAL_COMMUNITY)
Admission: EM | Admit: 2018-07-08 | Discharge: 2018-07-08 | Disposition: A | Discharge status: Home / Self Care | Payer: Medicaid Other | Attending: Emergency Medicine | Admitting: Emergency Medicine

## 2018-07-08 ENCOUNTER — Emergency Department (HOSPITAL_COMMUNITY): Payer: Medicaid Other

## 2018-07-08 ENCOUNTER — Encounter (HOSPITAL_COMMUNITY): Payer: Self-pay | Admitting: Emergency Medicine

## 2018-07-08 DIAGNOSIS — R0789 Other chest pain: Secondary | ICD-10-CM | POA: Insufficient documentation

## 2018-07-08 DIAGNOSIS — R0602 Shortness of breath: Secondary | ICD-10-CM | POA: Diagnosis present

## 2018-07-08 DIAGNOSIS — F17210 Nicotine dependence, cigarettes, uncomplicated: Secondary | ICD-10-CM | POA: Insufficient documentation

## 2018-07-08 DIAGNOSIS — J209 Acute bronchitis, unspecified: Secondary | ICD-10-CM | POA: Insufficient documentation

## 2018-07-08 MED ORDER — ALBUTEROL SULFATE HFA 108 (90 BASE) MCG/ACT IN AERS
2.0000 | INHALATION_SPRAY | RESPIRATORY_TRACT | Status: DC | PRN
  Administered 2018-07-08: 2 via RESPIRATORY_TRACT
  Filled 2018-07-08: qty 6.7

## 2018-07-08 MED ORDER — IBUPROFEN 200 MG PO TABS
400.0000 mg | ORAL_TABLET | Freq: Once | ORAL | Status: AC
  Administered 2018-07-08: 400 mg via ORAL
  Filled 2018-07-08: qty 2

## 2018-07-08 MED ORDER — ACETAMINOPHEN 325 MG PO TABS
650.0000 mg | ORAL_TABLET | Freq: Once | ORAL | Status: DC
  Filled 2018-07-08: qty 2

## 2018-07-08 MED ORDER — ALBUTEROL SULFATE (2.5 MG/3ML) 0.083% IN NEBU
5.0000 mg | INHALATION_SOLUTION | Freq: Once | RESPIRATORY_TRACT | Status: AC
  Administered 2018-07-08: 5 mg via RESPIRATORY_TRACT
  Filled 2018-07-08: qty 6

## 2018-07-08 MED ORDER — AEROCHAMBER PLUS FLO-VU MISC
1.0000 | Freq: Once | Status: AC
  Administered 2018-07-08: 1

## 2018-07-08 NOTE — ED Triage Notes (Addendum)
Pt from home with c/o sob x weeks. Pt states she has been using her inhaler with minimal relief. Pt states she has had pain in ribcage with movement for a few weeks too. Pt has wheezes in all fields Pt also has reports of muscle cramps all over and decreased sensation in her feet since her hip surgery

## 2018-07-08 NOTE — ED Provider Notes (Signed)
[facility] DEPT Provider Note   CSN: [id number] Arrival date & time: 07/08/18  0500     History   Chief Complaint Chief Complaint  Patient presents with  . Shortness of Breath    HPI Jaime Phillips is a 57 y.o. female.  HPI complains of cough for the past 2 weeks, productive of clear sputum and mild shortness of breath.  Also complains of pain at left rib cage since 9 PM yesterday which occurs after a coughing spell.  Chest pain is worse with coughing or changing positions and improved with remaining still.  She denies any fever.  She has not been using an inhaler.  She ran out of her inhaler 1 month ago.  She is treated herself with ibuprofen, without relief.  Feels that breathing is normal after treatment with albuterol nebulizer performed here prior to my exam Past Medical History:  Diagnosis Date  . Abnormal uterine bleeding   . Alcoholism ([place])   . Arthritis   . Cholelithiasis   . Chronic pain syndrome   . Depression   . Diverticulosis   . Headache(784.0)   . Hiatal hernia   . Hyperplastic colon polyp   . Hypopotassemia   . Internal hemorrhoids   . LFT elevation   . Macrocytic anemia   . Mallory-Weiss tear   . Potassium (K) deficiency   . PUD (peptic ulcer disease)   . Rectal bleeding    minor     Patient Active Problem List   Diagnosis Date Noted  . RUQ abdominal pain 01/25/2018  . Humeral head fracture, right, closed, initial encounter 08/08/2016  . Hyponatremia 08/08/2016  . Acute URI 08/08/2016  . Closed fracture of right proximal humerus   . Cause of injury, fall   . Chest pain 08/07/2016  . Alcohol-induced mood disorder ([place]) 11/03/2015  . Substance induced mood disorder ([place]) 11/03/2015  . ETOH abuse   . Alcohol use disorder, severe, dependence ([place]) 10/03/2015  . Major psychotic depression, recurrent ([place]) 10/03/2015  . Ankle edema 03/17/2015  . Cirrhosis ([place]) 02/24/2015  . Black stools 02/24/2015  . Abdominal  pain, epigastric 01/29/2015  . Nausea with vomiting 01/29/2015  . 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 Dysphagia 01/29/2015  . Hypokalemia 12/19/2014  . Hypomagnesemia 12/19/2014  . Malnutrition of moderate degree ([place]) 12/18/2014  . Alcoholic ketoacidosis 05/18/5101  . Alcohol abuse, in remission 02/14/2014  . Elevated LFTs 02/14/2014  . Essential hypertension, benign 02/14/2014  . Smoking 02/14/2014  . Upper GI bleed 02/06/2014  . Chronic pain syndrome 02/06/2014  . Gastro-esophageal reflux 02/06/2014  . Dental caries 01/17/2014  . Periprosthetic fracture of hip 05/31/2013  . Hip fracture ([place]) 05/27/2013  . Normocytic anemia 04/11/2013  . S/P right THA, AA 04/10/2013    Past Surgical History:  Procedure Laterality Date  . APPENDECTOMY    . COLONOSCOPY    . ESOPHAGOGASTRODUODENOSCOPY N/A 02/06/2014   Procedure: ESOPHAGOGASTRODUODENOSCOPY (EGD);  Surgeon: Milus Banister, MD;  Location: [facility];  Service: Endoscopy;  Laterality: N/A;  . OOPHORECTOMY    . ORIF HUMERUS FRACTURE Right 08/10/2016   Procedure: OPEN REDUCTION INTERNAL FIXATION (ORIF) RIGHT PROXIMAL HUMERUS FRACTURE;  Surgeon: Renette Butters, MD;  Location: [facility];  Service: Orthopedics;  Laterality: Right;  . ORIF PERIPROSTHETIC FRACTURE Right 05/28/2013   Procedure: OPEN REDUCTION INTERNAL FIXATION (ORIF) PERIPROSTHETIC FRACTURE;  Surgeon: Mauri Pole, MD;  Location: [facility];  Service: Orthopedics;  Laterality: Right;  . SHOULDER SURGERY Left   .  TOTAL HIP ARTHROPLASTY Right 04/10/2013   Procedure: RIGHT TOTAL HIP ARTHROPLASTY ANTERIOR APPROACH;  Surgeon: Mauri Pole, MD;  Location: [facility];  Service: Orthopedics;  Laterality: Right;  . TOTAL HIP ARTHROPLASTY     left hip     OB History    Gravida  2   Para  1   Term  1   Preterm      AB  1   Living  1     SAB  1   TAB      Ectopic      Multiple      Live Births               Home Medications    Prior to Admission medications     Medication Sig Start Date End Date Taking? Authorizing Provider  ibuprofen (ADVIL,MOTRIN) 200 MG tablet Take 600 mg by mouth every 6 (six) hours as needed for headache or mild pain.     [provider]  Multiple Vitamins-Minerals (MULTIVITAMIN WOMEN 50+) TABS Take 1 tablet by mouth daily.    [provider]  pantoprazole (PROTONIX) 20 MG tablet Take 1 tablet (20 mg total) by mouth daily. 03/01/18   Zehr, Janett Billow D, PA-C  vitamin E (VITAMIN E) 400 UNIT capsule Take 400 Units by mouth daily.    [provider]    Family History Family History  Problem Relation Age of Onset  . Breast cancer Sister   . Diabetes Mother   . Kidney disease Mother   . Heart disease Father   . Colon cancer Father        questionable    Social History Social History   Tobacco Use  . Smoking status: Current Every Day Smoker    Packs/day: 0.50    Years: 25.00    Pack years: 12.50    Types: Cigarettes  . Smokeless tobacco: Never Used  . Tobacco comment: Tobacco info given to pt. 08/16/12  Substance Use Topics  . Alcohol use: Yes    Alcohol/week: 2.0 - 3.0 standard drinks    Types: 2 - 3 Shots of liquor per week    Comment: pt quit drinking 2 months ago (today is 03/11/15)  . Drug use: No     Allergies   Patient has no known allergies.   Review of Systems Review of Systems  Constitutional: Positive for fever.  HENT: Negative.   Respiratory: Positive for cough and shortness of breath.   Cardiovascular: Negative.   Gastrointestinal: Negative.   Musculoskeletal: Positive for myalgias.       Complains of diffuse muscle cramps and numbness in her feet bilaterally for several months unchanged  Skin: Negative.   Neurological: Positive for numbness.  Psychiatric/Behavioral: Negative.   All other systems reviewed and are negative.    Physical Exam Updated Vital Signs There were no vitals taken for this visit.  Physical Exam  Constitutional: She is oriented to person,  place, and time.  Chronically ill-appearing.  No distress  HENT:  Head: Normocephalic and atraumatic.  Eyes: Pupils are equal, round, and reactive to light. Conjunctivae are normal.  Neck: Neck supple. No tracheal deviation present. No thyromegaly present.  Cardiovascular: Normal rate, regular rhythm, normal heart sounds and intact distal pulses.  No murmur heard. DP pulses 2+.  Radial pulses 2+.  Pulmonary/Chest: Effort normal and breath sounds normal.  Abdominal: Soft. Bowel sounds are normal. She exhibits no distension. There is no tenderness.  Musculoskeletal: Normal range of  motion. She exhibits no edema or tenderness.  All 4 extremities without redness swelling or tenderness neurovascular intact  Neurological: She is alert and oriented to person, place, and time. Coordination normal.  Skin: Skin is warm and dry. Capillary refill takes less than 2 seconds. No rash noted.  Psychiatric: She has a normal mood and affect.  Nursing note and vitals reviewed.    ED Treatments / Results  Labs (all labs ordered are listed, but only abnormal results are displayed) Labs Reviewed - No data to display  EKG None  Radiology Dg Chest 2 View  Result Date: 07/08/2018 CLINICAL DATA:  Initial evaluation for acute wheezing. EXAM: CHEST - 2 VIEW COMPARISON:  Prior radiograph from 02/19/2018. FINDINGS: The cardiac and mediastinal silhouettes are stable in size and contour, and remain within normal limits. Aortic atherosclerosis. The lungs are normally inflated. No airspace consolidation, pleural effusion, or pulmonary edema is identified. There is no pneumothorax. No acute osseous abnormality identified. IMPRESSION: No radiographic evidence for active cardiopulmonary disease. Electronically Signed   By: Jeannine Boga M.D.   On: 07/08/2018 06:36    Procedures Procedures (including critical care time)  Medications Ordered in ED Medications  acetaminophen (TYLENOL) tablet 650 mg (has no  administration in time range)  albuterol (PROVENTIL) (2.5 MG/3ML) 0.083% nebulizer solution 5 mg (5 mg Nebulization Given 07/08/18 0507)   Results for orders placed or performed during the hospital encounter of 06/27/18  Wet prep, genital  Result Value Ref Range   Yeast Wet Prep HPF POC NONE SEEN NONE SEEN   Trich, Wet Prep PRESENT (A) NONE SEEN   Clue Cells Wet Prep HPF POC PRESENT (A) NONE SEEN   WBC, Wet Prep HPF POC MANY (A) NONE SEEN   Sperm NONE SEEN   Lipase, blood  Result Value Ref Range   Lipase 31 11 - 51 U/L  Comprehensive metabolic panel  Result Value Ref Range   Sodium 138 135 - 145 mmol/L   Potassium 4.2 3.5 - 5.1 mmol/L   Chloride 102 98 - 111 mmol/L   CO2 28 22 - 32 mmol/L   Glucose, Bld 104 (H) 70 - 99 mg/dL   BUN 14 6 - 20 mg/dL   Creatinine, Ser 0.74 0.44 - 1.00 mg/dL   Calcium 9.3 8.9 - 10.3 mg/dL   Total Protein 7.5 6.5 - 8.1 g/dL   Albumin 4.2 3.5 - 5.0 g/dL   AST 35 15 - 41 U/L   ALT 21 0 - 44 U/L   Alkaline Phosphatase 64 38 - 126 U/L   Total Bilirubin 0.7 0.3 - 1.2 mg/dL   GFR calc non Af Amer >60 >60 mL/min   GFR calc Af Amer >60 >60 mL/min   Anion gap 8 5 - 15  CBC  Result Value Ref Range   WBC 8.1 4.0 - 10.5 K/uL   RBC 3.97 3.87 - 5.11 MIL/uL   Hemoglobin 13.0 12.0 - 15.0 g/dL   HCT 40.9 36.0 - 46.0 %   MCV 103.0 (H) 80.0 - 100.0 fL   MCH 32.7 26.0 - 34.0 pg   MCHC 31.8 30.0 - 36.0 g/dL   RDW 13.9 11.5 - 15.5 %   Platelets 281 150 - 400 K/uL   nRBC 0.0 0.0 - 0.2 %  Urinalysis, Routine w reflex microscopic  Result Value Ref Range   Color, Urine YELLOW YELLOW   APPearance HAZY (A) CLEAR   Specific Gravity, Urine 1.029 1.005 - 1.030   pH 6.0 5.0 -  8.0   Glucose, UA NEGATIVE NEGATIVE mg/dL   Hgb urine dipstick SMALL (A) NEGATIVE   Bilirubin Urine NEGATIVE NEGATIVE   Ketones, ur 5 (A) NEGATIVE mg/dL   Protein, ur 30 (A) NEGATIVE mg/dL   Nitrite NEGATIVE NEGATIVE   Leukocytes, UA MODERATE (A) NEGATIVE   RBC / HPF 6-10 0 - 5 RBC/hpf    WBC, UA 21-50 0 - 5 WBC/hpf   Bacteria, UA NONE SEEN NONE SEEN   Squamous Epithelial / LPF 0-5 0 - 5   Mucus PRESENT   RPR  Result Value Ref Range   RPR Ser Ql Non Reactive Non Reactive  HIV antibody  Result Value Ref Range   HIV Screen 4th Generation wRfx Non Reactive Non Reactive  I-Stat beta hCG blood, ED  Result Value Ref Range   I-stat hCG, quantitative <5.0 <5 mIU/mL   Comment 3          GC/Chlamydia probe amp  Result Value Ref Range   Chlamydia Negative    Neisseria gonorrhea Negative    Dg Chest 2 View  Result Date: 07/08/2018 CLINICAL DATA:  Initial evaluation for acute wheezing. EXAM: CHEST - 2 VIEW COMPARISON:  Prior radiograph from 02/19/2018. FINDINGS: The cardiac and mediastinal silhouettes are stable in size and contour, and remain within normal limits. Aortic atherosclerosis. The lungs are normally inflated. No airspace consolidation, pleural effusion, or pulmonary edema is identified. There is no pneumothorax. No acute osseous abnormality identified. IMPRESSION: No radiographic evidence for active cardiopulmonary disease. Electronically Signed   By: Jeannine Boga M.D.   On: 07/08/2018 06:36   US Transvaginal Non-ob  Result Date: 06/27/2018 CLINICAL DATA:  Pelvic pain and vaginal bleeding. EXAM: TRANSABDOMINAL AND TRANSVAGINAL ULTRASOUND OF PELVIS TECHNIQUE: Both transabdominal and transvaginal ultrasound examinations of the pelvis were performed. Transabdominal technique was performed for global imaging of the pelvis including uterus, ovaries, adnexal regions, and pelvic cul-de-sac. It was necessary to proceed with endovaginal exam following the transabdominal exam to visualize the adnexa. COMPARISON:  CT abdomen pelvis dated July 28, 2012. FINDINGS: Uterus Measurements: 5.1 x 2.1 x 3.3 cm = volume: 18 mL mL. Small 6 mm intramural fibroid in the posterior fundus. Endometrium Thickness: 3 mm. Small amount of fluid within the endometrial canal with 3 mm hypoechoic  lesion. Right ovary Measurements: 1.7 x 0.9 x 1.1 cm = volume: 1 mL. Normal appearance/no adnexal mass. Left ovary Surgically absent. Other findings No abnormal free fluid. IMPRESSION: 1. 3 mm hypoechoic lesion in the endometrial canal, potentially a polyp. In the setting of post-menopausal bleeding, endometrial sampling is indicated to exclude carcinoma. If results are benign, sonohysterogram should be considered for focal lesion work-up prior to hysteroscopy. (Ref: Radiological Reasoning: Algorithmic Workup of Abnormal Vaginal Bleeding with Endovaginal Sonography and Sonohysterography. AJR 2008; 660:Y30-16) 2. Small 6 mm uterine intramural fibroid. Electronically Signed   By: Titus Dubin M.D.   On: 06/27/2018 17:21   US Pelvis Complete  Result Date: 06/27/2018 CLINICAL DATA:  Pelvic pain and vaginal bleeding. EXAM: TRANSABDOMINAL AND TRANSVAGINAL ULTRASOUND OF PELVIS TECHNIQUE: Both transabdominal and transvaginal ultrasound examinations of the pelvis were performed. Transabdominal technique was performed for global imaging of the pelvis including uterus, ovaries, adnexal regions, and pelvic cul-de-sac. It was necessary to proceed with endovaginal exam following the transabdominal exam to visualize the adnexa. COMPARISON:  CT abdomen pelvis dated July 28, 2012. FINDINGS: Uterus Measurements: 5.1 x 2.1 x 3.3 cm = volume: 18 mL mL. Small 6 mm intramural fibroid in the posterior  fundus. Endometrium Thickness: 3 mm. Small amount of fluid within the endometrial canal with 3 mm hypoechoic lesion. Right ovary Measurements: 1.7 x 0.9 x 1.1 cm = volume: 1 mL. Normal appearance/no adnexal mass. Left ovary Surgically absent. Other findings No abnormal free fluid. IMPRESSION: 1. 3 mm hypoechoic lesion in the endometrial canal, potentially a polyp. In the setting of post-menopausal bleeding, endometrial sampling is indicated to exclude carcinoma. If results are benign, sonohysterogram should be considered for  focal lesion work-up prior to hysteroscopy. (Ref: Radiological Reasoning: Algorithmic Workup of Abnormal Vaginal Bleeding with Endovaginal Sonography and Sonohysterography. AJR 2008; 086:V78-46) 2. Small 6 mm uterine intramural fibroid. Electronically Signed   By: Titus Dubin M.D.   On: 06/27/2018 17:21    Initial Impression / Assessment and Plan / ED Course  I have reviewed the triage vital signs and the nursing notes.  Pertinent labs & imaging results that were available during my care of the patient were reviewed by me and considered in my medical decision making (see chart for details).     8:25 AM patient states her breathing is normal.  She feels ready to go home.   pulse counted at 100 bpm by me.  I counseled patient for 5 minutes on smoking cessation.  Chest x-ray viewed by me.  No clinical evidence of pneumonia .chest pain is felt to be musculoskeletal in etiology.  She will get an albuterol inhaler with spacer to go. she will get ibuprofen prior to discharge.  She should avoid Tylenol in light of history of cirrhosis.  Referral to primary care  Final Clinical Impressions(s) / ED Diagnoses  #1 acute bronchitis #2 tobacco abuse #3 chest wall pain Final diagnoses:  None    ED Discharge Orders    None       Orlie Dakin, MD 07/08/18 1704

## 2018-07-08 NOTE — Discharge Instructions (Signed)
Use your albuterol inhaler with spacer 2 puffs every 4 hours as needed for shortness of breath.  Return if needed more than every 4 hours.  Take ibuprofen 400 mg 3 times daily as needed for pain.  Call the number on these instructions to get a primary care physician.  Ask your new primary care physician to help you to stop smoking

## 2018-07-11 NOTE — Congregational Nurse Program (Signed)
  Dept: [facility] Nurse Program Note  Date of Encounter: 07/11/2018  Past Medical History: Past Medical History:  Diagnosis Date  . Abnormal uterine bleeding   . Alcoholism ([place])   . Arthritis   . Cholelithiasis   . Chronic pain syndrome   . Depression   . Diverticulosis   . Headache(784.0)   . Hiatal hernia   . Hyperplastic colon polyp   . Hypopotassemia   . Internal hemorrhoids   . LFT elevation   . Macrocytic anemia   . Mallory-Weiss tear   . Potassium (K) deficiency   . PUD (peptic ulcer disease)   . Rectal bleeding    minor     Encounter Details: CNP Questionnaire - 07/11/18 1826      Questionnaire   Patient Status  Not Applicable    Race  Black or African American    Location Patient Served At  Not Applicable    Insurance  Medicaid    Uninsured  Not Applicable    Food  No food insecurities    Housing/Utilities  No permanent housing    Transportation  Yes, need transportation assistance;Provided transportation assistance (bus pass, taxi voucher, etc.);Within past 12 months, lack of transportation negatively impacted life    Interpersonal Safety  Yes, feel physically and emotionally safe where you currently live    Medication  No medication insecurities    Medical Provider  No    Referrals  Primary Care Provider/Clinic;Dental    ED Visit Averted  Not Applicable    Life-Saving Intervention Made  Not Applicable     Initial visit to see the nurse after two recent visits to ED . States she has a cough and some congestion ,was given an inhaler which she is using and it seems to be helping but feels she cant sleep at night and needs some cough medication. Discussed my role in getting her a PCP ,she is to call in the am for an appointment at the [facility] clinic ,states she had a lot of dental work done mouth still sore and needs to return to the dentist ,will call tomorrow and once appointment obtained will assist with getting a bus pass to return for  dental care.aslo discussed [employer] transportation  For medical visits. She states her friend died about  2 years ago ,she lost the house and the car ,was living with her son until he moved with his family of 54 out of town. She does get SSI and is saving to get her a place. She states she  Has something but not cirrhois of the liver ,never states she has any substance abuse issues .Surgeries include both hip replacements  . Reviewed her Ed visits and counseled regarding the importance of having a PCP ,agreed she needs to follow up . Admits transportation has caused her not to keep other appointments. Has lost weight due to dental problems .Will follow up with client on tomorrow on appointments  Counseled regarding smoking, high blood pressure and smokers cough

## 2018-07-12 ENCOUNTER — Telehealth: Payer: Self-pay

## 2018-07-12 NOTE — Telephone Encounter (Signed)
TC to client to remind of follow up visit today . States she had an emergency with her grandchild and couldn't get back in to the center this pm . Ask if she called for her PCP and dental appointments today she replied yes ,dental was made for 08-03-18 ,called for PCP was ask to call back tomorrow . Reminded of importance of calling  For PCP appointment . Nurse will follow up next week and complete [employer] transportation application. Client to see nurse 07-18-18

## 2018-07-18 ENCOUNTER — Telehealth: Payer: Self-pay

## 2018-07-18 NOTE — Telephone Encounter (Signed)
Client not on site ,called her to follow up on PCP appointment, states she is having a tooth ache now ,hurts really bad has taken several things no relief ,ask if she called the dentist to see if they had a cancellation and could work her in  ,she states yes ,not sure that client did that sounds a little confused ,repeating herself ,states she left me a note in the nurse's box about her appointment and that she has [employer] ask who helped her with the application not sure ,nurse feels she has mixed things up as the nurse was going to assist her with the application.History of substance abuse alcohol use per management as she enters the [facility] nightly .left client a note to see the nurse and will follow up.

## 2018-07-19 ENCOUNTER — Telehealth: Payer: Self-pay

## 2018-07-19 NOTE — Congregational Nurse Program (Signed)
  Dept: [facility] Nurse Program Note  Date of Encounter: 07/19/2018  Past Medical History: Past Medical History:  Diagnosis Date  . Abnormal uterine bleeding   . Alcoholism ([place])   . Arthritis   . Cholelithiasis   . Chronic pain syndrome   . Depression   . Diverticulosis   . Headache(784.0)   . Hiatal hernia   . Hyperplastic colon polyp   . Hypopotassemia   . Internal hemorrhoids   . LFT elevation   . Macrocytic anemia   . Mallory-Weiss tear   . Potassium (K) deficiency   . PUD (peptic ulcer disease)   . Rectal bleeding    minor     Encounter Details: CNP Questionnaire - 07/19/18 1836      Questionnaire   Patient Status  Not Applicable    Race  Black or African American    Location Patient Served At  Not Applicable    Insurance  Medicaid    Uninsured  Not Applicable    Food  No food insecurities    Housing/Utilities  No permanent housing    Transportation  Within past 12 months, lack of transportation negatively impacted life;Yes, need transportation assistance    Interpersonal Safety  Yes, feel physically and emotionally safe where you currently live    Medication  Yes, have medication insecurities    Medical Provider  No    Referrals  Affordable Care Act (ACA);Primary Care Provider/Clinic;Dental    ED Visit Averted  Not Applicable    Life-Saving Intervention Made  Not Applicable     Nurse called to see if dental appointment could be moved up from January as client was complaining about severe dental pain , was able to secure appointment for 07-20-18 at 11;30 am ,client to see if she can get co pay amount may have to cancel dental  appointment and will have to wait until 08-08-18 for dental appointment. To cancel if she cant keep appointment . Nurse called client to remind of follow up visit  today Not sure about PCP appointment she states she has made the appointment and it is on  07-31-18 . Has paper work,needs to bring it to the nurse . Bus  pass for dental appointment given to case manager only if she goes to dentist    will work on [employer] application after holidays .

## 2018-07-19 NOTE — Telephone Encounter (Signed)
Nurse called client after appointment was made for dental appointment on 07-20-18.To come in and see nurse.Attending Home  Safety class this pm

## 2018-07-19 NOTE — Telephone Encounter (Signed)
TC to dentist office ,appointmnet for pain made on 07-20-18 @11 :30 am. Will let client know .

## 2018-08-03 ENCOUNTER — Other Ambulatory Visit: Payer: Self-pay | Admitting: Family Medicine

## 2018-08-03 DIAGNOSIS — R296 Repeated falls: Secondary | ICD-10-CM

## 2018-08-09 ENCOUNTER — Telehealth: Payer: Self-pay

## 2018-08-09 NOTE — Telephone Encounter (Signed)
Voicemail full unable to leave a message for client as she has been D/C from [place] .

## 2018-08-09 NOTE — Telephone Encounter (Signed)
Called client to see if she was going to keep her dental appointment for pain,states she has already cancelled the appointment because she thought  She only had one bus ticket and would need a ride back ,when she discovered she had two tickets the appointment had already been cancelled. Nurse explained that she was the one expressing the degree of dental pain she was having and that now she would have to wait until January 7,2020 to be seen. Client understands . States receptionist was to check with dentist to see if she would be able to get her something for the dental pain pain until she can be seen ,they are to call her back .Explained to her that the nurse would be out of the office for holidays . Client states she has an appointment at [facility] clinic for 07-31-18 encouraged her to keep that appointment she agrees.

## 2018-09-09 ENCOUNTER — Other Ambulatory Visit: Payer: Self-pay

## 2018-09-09 ENCOUNTER — Encounter (HOSPITAL_COMMUNITY): Payer: Self-pay | Admitting: *Deleted

## 2018-09-09 ENCOUNTER — Emergency Department (HOSPITAL_COMMUNITY)
Admission: EM | Admit: 2018-09-09 | Discharge: 2018-09-10 | Disposition: A | Discharge status: Other Acute Inpatient Hospital | Payer: Medicaid Other | Attending: Emergency Medicine | Admitting: Emergency Medicine

## 2018-09-09 DIAGNOSIS — F17210 Nicotine dependence, cigarettes, uncomplicated: Secondary | ICD-10-CM | POA: Insufficient documentation

## 2018-09-09 DIAGNOSIS — I10 Essential (primary) hypertension: Secondary | ICD-10-CM | POA: Insufficient documentation

## 2018-09-09 DIAGNOSIS — Z7982 Long term (current) use of aspirin: Secondary | ICD-10-CM | POA: Insufficient documentation

## 2018-09-09 DIAGNOSIS — Z96643 Presence of artificial hip joint, bilateral: Secondary | ICD-10-CM | POA: Insufficient documentation

## 2018-09-09 DIAGNOSIS — Z79899 Other long term (current) drug therapy: Secondary | ICD-10-CM | POA: Insufficient documentation

## 2018-09-09 DIAGNOSIS — F332 Major depressive disorder, recurrent severe without psychotic features: Secondary | ICD-10-CM | POA: Insufficient documentation

## 2018-09-09 DIAGNOSIS — R45851 Suicidal ideations: Secondary | ICD-10-CM

## 2018-09-09 DIAGNOSIS — F1010 Alcohol abuse, uncomplicated: Secondary | ICD-10-CM

## 2018-09-09 DIAGNOSIS — F1020 Alcohol dependence, uncomplicated: Secondary | ICD-10-CM | POA: Insufficient documentation

## 2018-09-09 DIAGNOSIS — F10920 Alcohol use, unspecified with intoxication, uncomplicated: Secondary | ICD-10-CM

## 2018-09-09 DIAGNOSIS — F333 Major depressive disorder, recurrent, severe with psychotic symptoms: Secondary | ICD-10-CM | POA: Diagnosis present

## 2018-09-09 LAB — COMPREHENSIVE METABOLIC PANEL
ALBUMIN: 4.9 g/dL (ref 3.5–5.0)
ALK PHOS: 79 U/L (ref 38–126)
ALT: 20 U/L (ref 0–44)
AST: 39 U/L (ref 15–41)
Anion gap: 8 (ref 5–15)
BILIRUBIN TOTAL: 0.3 mg/dL (ref 0.3–1.2)
BUN: 11 mg/dL (ref 6–20)
CALCIUM: 8.9 mg/dL (ref 8.9–10.3)
CO2: 28 mmol/L (ref 22–32)
Chloride: 109 mmol/L (ref 98–111)
Creatinine, Ser: 0.55 mg/dL (ref 0.44–1.00)
GFR calc Af Amer: >60 mL/min (ref >60)
GLUCOSE: 84 mg/dL (ref 70–99)
Potassium: 4.1 mmol/L (ref 3.5–5.1)
Sodium: 145 mmol/L (ref 135–145)
TOTAL PROTEIN: 8.4 g/dL — AB (ref 6.5–8.1)

## 2018-09-09 LAB — RAPID URINE DRUG SCREEN, HOSP PERFORMED
Amphetamines: NOT DETECTED
Barbiturates: NOT DETECTED
Benzodiazepines: NOT DETECTED
Cocaine: NOT DETECTED
Opiates: NOT DETECTED
Tetrahydrocannabinol: NOT DETECTED

## 2018-09-09 LAB — CBC
HCT: 45.6 % (ref 36.0–46.0)
Hemoglobin: 14.5 g/dL (ref 12.0–15.0)
MCH: 31.7 pg (ref 26.0–34.0)
MCHC: 31.8 g/dL (ref 30.0–36.0)
MCV: 99.8 fL (ref 80.0–100.0)
PLATELETS: 335 10*3/uL (ref 150–400)
RBC: 4.57 MIL/uL (ref 3.87–5.11)
RDW: 15.5 % (ref 11.5–15.5)
WBC: 10.9 10*3/uL — ABNORMAL HIGH (ref 4.0–10.5)
nRBC: 0 % (ref 0.0–0.2)

## 2018-09-09 LAB — ETHANOL: Alcohol, Ethyl (B): 339 mg/dL (ref <10)

## 2018-09-09 LAB — I-STAT BETA HCG BLOOD, ED (MC, WL, AP ONLY)

## 2018-09-09 NOTE — BHH Counselor (Signed)
Clinician received a call from the pt's son/IVC petitioner Jannifer Rodney [phone]). Pt's son reported, he is scared for his mother life due to her constant suicidal ideations, suicide attempt today (pt began fighting her preacher and tried jumping out of the car while getting food) and combative behaviors. Pt's son reported, he has a video of her dancing in the middle of the street, saying she wants to jump in front of train, and saying, "fuck it I'm ready to die." Pt's son reported, his mother was with his step-father for 20 years, three years ago his step-father died in his sleep while his mother was in the bed. Pt's son reported, his aunt (his mothers' sister) died last 2023/02/12 she was very close to the pt. Pt reported, the pt says she's ready to see her sister and she talks to her husband (who is deceased.) Pt's son's reported, the pt is homeless and is combative towards strangers especially when she's been drinking. Pt's son reported, his mother drinks daily and needs help. Pt's son reported, he will come to the hospital to show clinician the video he took of his mother today. Pt's son reported, he fears his mother is a danger to herself and others. Pt' son reported, he has been to the hospital over 40x trying to get help for his mother and she was admitted once. Pt's son reported, alcohol is apart of the issue, his mother has mental heath concerns and he is begging for help.    Vertell Novak, [place], [facility], [facility] Triage Specialist [phone]

## 2018-09-09 NOTE — ED Notes (Signed)
Dr Rogene Houston made aware of pt alcohol level 339.

## 2018-09-09 NOTE — BHH Counselor (Signed)
Clinician attempted to engage the pt in TTS assessment however the pt was unable to rouse. Pt is currently sleeping. Clinician to continue to check back. TTS assessment will be completed once pt is alert, roused and able to engage. Discussed with Bailey Mech, RN.   Vertell Novak, [place], [facility], [facility] Triage Specialist [phone]

## 2018-09-09 NOTE — ED Provider Notes (Signed)
[place] DEPT Provider Note   CSN: [id number] Arrival date & time: 09/09/18  1841     History   Chief Complaint Chief Complaint  Patient presents with  . Medical Clearance    HPI Jaime Phillips is a 58 y.o. female.  Patient brought in by EMS.  Patient son took IVC paperwork on her for stating that she was going to kill herself.  He apparently has video of her stating this today.  Stated that she was going to jump out of a moving vehicle.  Patient with known history of alcohol abuse.  Admitted drinking alcohol here today.  Patient is functional although although does come across as little intoxicated.  Patient upon arrival here denied any suicidal stuff and was mad at her son for taking out the IVC.     Past Medical History:  Diagnosis Date  . Abnormal uterine bleeding   . Alcoholism ([place])   . Arthritis   . Cholelithiasis   . Chronic pain syndrome   . Depression   . Diverticulosis   . Headache(784.0)   . Hiatal hernia   . Hyperplastic colon polyp   . Hypopotassemia   . Internal hemorrhoids   . LFT elevation   . Macrocytic anemia   . Mallory-Weiss tear   . Potassium (K) deficiency   . PUD (peptic ulcer disease)   . Rectal bleeding    minor     Patient Active Problem List   Diagnosis Date Noted  . RUQ abdominal pain 01/25/2018  . Humeral head fracture, right, closed, initial encounter 08/08/2016  . Hyponatremia 08/08/2016  . Acute URI 08/08/2016  . Closed fracture of right proximal humerus   . Cause of injury, fall   . Chest pain 08/07/2016  . 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 Substance induced mood disorder ([place]) 11/03/2015  . ETOH abuse   . Alcohol use disorder, severe, dependence ([place]) 10/03/2015  . Major psychotic depression, recurrent ([place]) 10/03/2015  . Ankle edema 03/17/2015  . Cirrhosis ([place]) 02/24/2015  . Black stools 02/24/2015  . Abdominal pain, epigastric 01/29/2015  . Nausea with vomiting  01/29/2015  . 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 Dysphagia 01/29/2015  . Hypokalemia 12/19/2014  . Hypomagnesemia 12/19/2014  . Malnutrition of moderate degree ([place]) 12/18/2014  . Alcoholic ketoacidosis 27/01/2375  . Alcohol abuse, in remission 02/14/2014  . Elevated LFTs 02/14/2014  . Essential hypertension, benign 02/14/2014  . Smoking 02/14/2014  . Upper GI bleed 02/06/2014  . Chronic pain syndrome 02/06/2014  . Gastro-esophageal reflux 02/06/2014  . Dental caries 01/17/2014  . Periprosthetic fracture of hip 05/31/2013  . Hip fracture ([place]) 05/27/2013  . Normocytic anemia 04/11/2013  . S/P right THA, AA 04/10/2013    Past Surgical History:  Procedure Laterality Date  . APPENDECTOMY    . COLONOSCOPY    . ESOPHAGOGASTRODUODENOSCOPY N/A 02/06/2014   Procedure: ESOPHAGOGASTRODUODENOSCOPY (EGD);  Surgeon: Milus Banister, MD;  Location: [facility];  Service: Endoscopy;  Laterality: N/A;  . OOPHORECTOMY    . ORIF HUMERUS FRACTURE Right 08/10/2016   Procedure: OPEN REDUCTION INTERNAL FIXATION (ORIF) RIGHT PROXIMAL HUMERUS FRACTURE;  Surgeon: Renette Butters, MD;  Location: [facility];  Service: Orthopedics;  Laterality: Right;  . ORIF PERIPROSTHETIC FRACTURE Right 05/28/2013   Procedure: OPEN REDUCTION INTERNAL FIXATION (ORIF) PERIPROSTHETIC FRACTURE;  Surgeon: Mauri Pole, MD;  Location: [facility];  Service: Orthopedics;  Laterality: Right;  . SHOULDER SURGERY Left   . TOTAL HIP ARTHROPLASTY Right 04/10/2013  Procedure: RIGHT TOTAL HIP ARTHROPLASTY ANTERIOR APPROACH;  Surgeon: Mauri Pole, MD;  Location: [facility];  Service: Orthopedics;  Laterality: Right;  . TOTAL HIP ARTHROPLASTY     left hip     OB History    Gravida  2   Para  1   Term  1   Preterm      AB  1   Living  1     SAB  1   TAB      Ectopic      Multiple      Live Births               Home Medications    Prior to Admission medications   Medication Sig Start Date End Date Taking?  Authorizing Provider  acetaminophen (TYLENOL) 500 MG tablet Take 1,000 mg by mouth every 6 (six) hours as needed for mild pain.   Yes [provider]  aspirin 325 MG tablet Take 325 mg by mouth daily.   Yes [provider]  FLOVENT HFA 110 MCG/ACT inhaler Inhale 1 puff into the lungs 2 (two) times daily. 09/01/18  Yes [provider]  ibuprofen (ADVIL,MOTRIN) 200 MG tablet Take 600 mg by mouth every 6 (six) hours as needed for headache or mild pain.    Yes [provider]  Multiple Vitamins-Minerals (MULTIVITAMIN WOMEN 50+) TABS Take 1 tablet by mouth daily.   Yes [provider]  pantoprazole (PROTONIX) 20 MG tablet Take 1 tablet (20 mg total) by mouth daily. 03/01/18  Yes Zehr, Laban Emperor, PA-C  Vitamin D, Ergocalciferol, (DRISDOL) 1.25 MG (50000 UT) CAPS capsule Take 50,000 Units by mouth every 7 (seven) days.  09/01/18  Yes [provider]  vitamin E (VITAMIN E) 400 UNIT capsule Take 400 Units by mouth daily.   Yes [provider]    Family History Family History  Problem Relation Age of Onset  . Breast cancer Sister   . Diabetes Mother   . Kidney disease Mother   . Heart disease Father   . Colon cancer Father        questionable    Social History Social History   Tobacco Use  . Smoking status: Current Every Day Smoker    Packs/day: 0.50    Years: 25.00    Pack years: 12.50    Types: Cigarettes  . Smokeless tobacco: Never Used  . Tobacco comment: Tobacco info given to pt. 08/16/12  Substance Use Topics  . Alcohol use: Yes    Alcohol/week: 2.0 - 3.0 standard drinks    Types: 2 - 3 Shots of liquor per week    Comment: pt quit drinking 2 months ago (today is 03/11/15)  . Drug use: No     Allergies   Patient has no known allergies.   Review of Systems Review of Systems  Constitutional: Negative for chills and fever.  HENT: Negative for rhinorrhea and sore throat.   Eyes: Negative for visual disturbance.    Respiratory: Negative for cough and shortness of breath.   Cardiovascular: Negative for chest pain and leg swelling.  Gastrointestinal: Negative for abdominal pain, diarrhea, nausea and vomiting.  Genitourinary: Negative for dysuria.  Musculoskeletal: Negative for back pain and neck pain.  Skin: Negative for rash.  Neurological: Negative for dizziness, light-headedness and headaches.  Hematological: Does not bruise/bleed easily.  Psychiatric/Behavioral: Negative for confusion.     Physical Exam Updated Vital Signs BP (!) 139/98 (BP Location: Left Arm)  Pulse (!) 104   Temp 98.2 F (36.8 C) (Oral)   Resp 18   SpO2 98%   Physical Exam Vitals signs and nursing note reviewed.  Constitutional:      General: She is not in acute distress.    Appearance: She is well-developed. She is not toxic-appearing.  HENT:     Head: Normocephalic and atraumatic.     Mouth/Throat:     Mouth: Mucous membranes are moist.  Eyes:     Conjunctiva/sclera: Conjunctivae normal.  Neck:     Musculoskeletal: Neck supple.  Cardiovascular:     Rate and Rhythm: Normal rate and regular rhythm.     Heart sounds: No murmur.  Pulmonary:     Effort: Pulmonary effort is normal. No respiratory distress.     Breath sounds: Normal breath sounds.  Abdominal:     General: Bowel sounds are normal.     Palpations: Abdomen is soft.     Tenderness: There is no abdominal tenderness.  Musculoskeletal: Normal range of motion.  Skin:    General: Skin is warm and dry.  Neurological:     General: No focal deficit present.     Mental Status: She is alert.     Cranial Nerves: No cranial nerve deficit.     Sensory: No sensory deficit.     Motor: No weakness.     Comments: Patient quite functional but does appear intoxicated.  Able to hold a conversation.      ED Treatments / Results  Labs (all labs ordered are listed, but only abnormal results are displayed) Labs Reviewed  COMPREHENSIVE METABOLIC PANEL -  Abnormal; Notable for the following components:      Result Value   Total Protein 8.4 (*)    All other components within normal limits  ETHANOL - Abnormal; Notable for the following components:   Alcohol, Ethyl (B) 339 (*)    All other components within normal limits  CBC - Abnormal; Notable for the following components:   WBC 10.9 (*)    All other components within normal limits  RAPID URINE DRUG SCREEN, HOSP PERFORMED  I-STAT BETA HCG BLOOD, ED ([facility], [facility], [facility] ONLY)    EKG None  Radiology No results found.  Procedures Procedures (including critical care time)  Medications Ordered in ED Medications - No data to display   Initial Impression / Assessment and Plan / ED Course  I have reviewed the triage vital signs and the nursing notes.  Pertinent labs & imaging results that were available during my care of the patient were reviewed by me and considered in my medical decision making (see chart for details).     Patient medically cleared.  Son insists that she was espousing suicidal ideation he has a video on his phone showing it.  Patient stated that she was gone jumped out of moving vehicle.  Patient does have an alcohol abuse problem.  Is intoxicated.  Blood alcohol level over 300.  But quite functional.  Patient upon arrival here denied any suicidal thoughts.  Patient without any evidence of any alcohol withdrawal.  Patient's urine drug screen was negative.  Labs without significant abnormality.  Final Clinical Impressions(s) / ED Diagnoses   Final diagnoses:  Alcohol abuse  Alcoholic intoxication without complication [facility])  Suicidal ideation    ED Discharge Orders    None       Fredia Sorrow, MD 09/09/18 2157

## 2018-09-09 NOTE — ED Notes (Signed)
Shikha, [place], [phone] or [phone].

## 2018-09-09 NOTE — ED Notes (Signed)
Pt asked nurse to call doctor about pain. Docia Furl called and made aware.
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
## 2018-09-09 NOTE — ED Notes (Signed)
Pt alert and cooperative. Pt denies any si, hi, or avh at this time. Pt answers questions approprietly. Pt resting in bed. Pt safe will continue to monitor.

## 2018-09-09 NOTE — ED Notes (Signed)
Pt tearful after speaking with son.

## 2018-09-10 ENCOUNTER — Inpatient Hospital Stay (HOSPITAL_COMMUNITY)
Admission: AD | Admit: 2018-09-10 | Discharge: 2018-09-12 | DRG: 897 | Disposition: A | Discharge status: Home / Self Care | Payer: Medicaid Other | Attending: Psychiatry | Admitting: Psychiatry

## 2018-09-10 ENCOUNTER — Encounter (HOSPITAL_COMMUNITY): Payer: Self-pay | Admitting: *Deleted

## 2018-09-10 ENCOUNTER — Other Ambulatory Visit: Payer: Self-pay

## 2018-09-10 DIAGNOSIS — Z841 Family history of disorders of kidney and ureter: Secondary | ICD-10-CM

## 2018-09-10 DIAGNOSIS — Z833 Family history of diabetes mellitus: Secondary | ICD-10-CM | POA: Diagnosis not present

## 2018-09-10 DIAGNOSIS — F1024 Alcohol dependence with alcohol-induced mood disorder: Secondary | ICD-10-CM | POA: Diagnosis present

## 2018-09-10 DIAGNOSIS — Z590 Homelessness: Secondary | ICD-10-CM | POA: Diagnosis not present

## 2018-09-10 DIAGNOSIS — Z7982 Long term (current) use of aspirin: Secondary | ICD-10-CM

## 2018-09-10 DIAGNOSIS — Z803 Family history of malignant neoplasm of breast: Secondary | ICD-10-CM | POA: Diagnosis not present

## 2018-09-10 DIAGNOSIS — Z8249 Family history of ischemic heart disease and other diseases of the circulatory system: Secondary | ICD-10-CM

## 2018-09-10 DIAGNOSIS — F1020 Alcohol dependence, uncomplicated: Secondary | ICD-10-CM | POA: Diagnosis present

## 2018-09-10 DIAGNOSIS — Z96643 Presence of artificial hip joint, bilateral: Secondary | ICD-10-CM | POA: Diagnosis present

## 2018-09-10 DIAGNOSIS — F332 Major depressive disorder, recurrent severe without psychotic features: Secondary | ICD-10-CM | POA: Diagnosis present

## 2018-09-10 DIAGNOSIS — F17210 Nicotine dependence, cigarettes, uncomplicated: Secondary | ICD-10-CM | POA: Diagnosis present

## 2018-09-10 MED ORDER — LORAZEPAM 1 MG PO TABS
1.0000 mg | ORAL_TABLET | Freq: Four times a day (QID) | ORAL | Status: DC
  Administered 2018-09-10 – 2018-09-12 (×3): 1 mg via ORAL
  Filled 2018-09-10 (×3): qty 1

## 2018-09-10 MED ORDER — ENSURE ENLIVE PO LIQD
237.0000 mL | Freq: Two times a day (BID) | ORAL | Status: DC
  Administered 2018-09-11 (×2): 237 mL via ORAL

## 2018-09-10 MED ORDER — ACETAMINOPHEN 325 MG PO TABS
650.0000 mg | ORAL_TABLET | Freq: Four times a day (QID) | ORAL | Status: DC | PRN
  Administered 2018-09-11 – 2018-09-12 (×3): 650 mg via ORAL
  Filled 2018-09-10 (×2): qty 2

## 2018-09-10 MED ORDER — VITAMIN B-1 100 MG PO TABS
100.0000 mg | ORAL_TABLET | Freq: Every day | ORAL | Status: DC
  Administered 2018-09-10: 100 mg via ORAL
  Filled 2018-09-10: qty 1

## 2018-09-10 MED ORDER — LORAZEPAM 1 MG PO TABS: 0.0000 mg | ORAL_TABLET | Freq: Four times a day (QID) | ORAL | Status: DC

## 2018-09-10 MED ORDER — ADULT MULTIVITAMIN W/MINERALS CH
1.0000 | ORAL_TABLET | Freq: Every day | ORAL | Status: DC
  Administered 2018-09-10: 1 via ORAL
  Filled 2018-09-10: qty 1

## 2018-09-10 MED ORDER — LORAZEPAM 1 MG PO TABS
0.0000 mg | ORAL_TABLET | Freq: Four times a day (QID) | ORAL | Status: DC
  Filled 2018-09-10: qty 1

## 2018-09-10 MED ORDER — THIAMINE HCL 100 MG/ML IJ SOLN: 100.0000 mg | Freq: Every day | INTRAMUSCULAR | Status: DC

## 2018-09-10 MED ORDER — LORAZEPAM 2 MG/ML IJ SOLN: 1.0000 mg | Freq: Four times a day (QID) | INTRAMUSCULAR | Status: DC | PRN

## 2018-09-10 MED ORDER — GABAPENTIN 100 MG PO CAPS
100.0000 mg | ORAL_CAPSULE | Freq: Three times a day (TID) | ORAL | Status: DC
  Administered 2018-09-10 – 2018-09-12 (×5): 100 mg via ORAL
  Filled 2018-09-10 (×10): qty 1

## 2018-09-10 MED ORDER — ADULT MULTIVITAMIN W/MINERALS CH
1.0000 | ORAL_TABLET | Freq: Every day | ORAL | Status: DC
  Administered 2018-09-11 – 2018-09-12 (×2): 1 via ORAL
  Filled 2018-09-10 (×4): qty 1

## 2018-09-10 MED ORDER — LORAZEPAM 1 MG PO TABS: 1.0000 mg | ORAL_TABLET | Freq: Three times a day (TID) | ORAL | Status: DC

## 2018-09-10 MED ORDER — FLUOXETINE HCL 20 MG PO CAPS: 20.0000 mg | ORAL_CAPSULE | Freq: Every day | ORAL | Status: DC

## 2018-09-10 MED ORDER — MAGNESIUM HYDROXIDE 400 MG/5ML PO SUSP: 30.0000 mL | Freq: Every day | ORAL | Status: DC | PRN

## 2018-09-10 MED ORDER — FOLIC ACID 1 MG PO TABS
1.0000 mg | ORAL_TABLET | Freq: Every day | ORAL | Status: DC
  Administered 2018-09-10: 1 mg via ORAL
  Filled 2018-09-10: qty 1

## 2018-09-10 MED ORDER — GABAPENTIN 100 MG PO CAPS
200.0000 mg | ORAL_CAPSULE | Freq: Two times a day (BID) | ORAL | Status: DC
  Administered 2018-09-10: 200 mg via ORAL
  Filled 2018-09-10: qty 2

## 2018-09-10 MED ORDER — FOLIC ACID 1 MG PO TABS
1.0000 mg | ORAL_TABLET | Freq: Every day | ORAL | Status: DC
  Administered 2018-09-11 – 2018-09-12 (×2): 1 mg via ORAL
  Filled 2018-09-10 (×4): qty 1

## 2018-09-10 MED ORDER — LORAZEPAM 1 MG PO TABS: 1.0000 mg | ORAL_TABLET | Freq: Every day | ORAL | Status: DC

## 2018-09-10 MED ORDER — IBUPROFEN 200 MG PO TABS
600.0000 mg | ORAL_TABLET | Freq: Four times a day (QID) | ORAL | Status: DC | PRN
  Administered 2018-09-10: 600 mg via ORAL
  Filled 2018-09-10: qty 3

## 2018-09-10 MED ORDER — NICOTINE 14 MG/24HR TD PT24
14.0000 mg | MEDICATED_PATCH | Freq: Every day | TRANSDERMAL | Status: DC
  Administered 2018-09-10: 14 mg via TRANSDERMAL
  Filled 2018-09-10: qty 1

## 2018-09-10 MED ORDER — HYDROXYZINE HCL 25 MG PO TABS
25.0000 mg | ORAL_TABLET | Freq: Four times a day (QID) | ORAL | Status: DC | PRN
  Administered 2018-09-11: 25 mg via ORAL
  Filled 2018-09-10: qty 1

## 2018-09-10 MED ORDER — GABAPENTIN 100 MG PO CAPS
200.0000 mg | ORAL_CAPSULE | Freq: Two times a day (BID) | ORAL | Status: DC
  Filled 2018-09-10 (×2): qty 2

## 2018-09-10 MED ORDER — ONDANSETRON 4 MG PO TBDP: 4.0000 mg | ORAL_TABLET | Freq: Four times a day (QID) | ORAL | Status: DC | PRN

## 2018-09-10 MED ORDER — FLUOXETINE HCL 20 MG PO CAPS
20.0000 mg | ORAL_CAPSULE | Freq: Every day | ORAL | Status: DC
  Filled 2018-09-10: qty 1

## 2018-09-10 MED ORDER — LORAZEPAM 1 MG PO TABS: 1.0000 mg | ORAL_TABLET | Freq: Two times a day (BID) | ORAL | Status: DC

## 2018-09-10 MED ORDER — NICOTINE 14 MG/24HR TD PT24
14.0000 mg | MEDICATED_PATCH | Freq: Every day | TRANSDERMAL | Status: DC
  Administered 2018-09-11: 14 mg via TRANSDERMAL
  Filled 2018-09-10 (×4): qty 1

## 2018-09-10 MED ORDER — LORAZEPAM 1 MG PO TABS: 0.0000 mg | ORAL_TABLET | Freq: Two times a day (BID) | ORAL | Status: DC

## 2018-09-10 MED ORDER — VITAMIN B-1 100 MG PO TABS
100.0000 mg | ORAL_TABLET | Freq: Every day | ORAL | Status: DC
  Administered 2018-09-11 – 2018-09-12 (×2): 100 mg via ORAL
  Filled 2018-09-10 (×4): qty 1

## 2018-09-10 MED ORDER — LORAZEPAM 1 MG PO TABS: 1.0000 mg | ORAL_TABLET | Freq: Four times a day (QID) | ORAL | Status: DC | PRN

## 2018-09-10 MED ORDER — LOPERAMIDE HCL 2 MG PO CAPS: 2.0000 mg | ORAL_CAPSULE | ORAL | Status: DC | PRN

## 2018-09-10 MED ORDER — ALUM & MAG HYDROXIDE-SIMETH 200-200-20 MG/5ML PO SUSP
30.0000 mL | ORAL | Status: DC | PRN
  Administered 2018-09-12: 30 mL via ORAL
  Filled 2018-09-10: qty 30

## 2018-09-10 MED ORDER — IBUPROFEN 600 MG PO TABS
600.0000 mg | ORAL_TABLET | Freq: Four times a day (QID) | ORAL | Status: DC | PRN
  Administered 2018-09-10 – 2018-09-11 (×4): 600 mg via ORAL
  Filled 2018-09-10 (×4): qty 1

## 2018-09-10 NOTE — BHH Counselor (Signed)
Clinician observed the pt continue to sleep. TTS to continue to monitor. TTS assessment will be completed once pt is alert, roused and able to engage.   Vertell Novak, [place], [facility], [facility] Triage Specialist [phone]

## 2018-09-10 NOTE — ED Notes (Addendum)
Pt ambulatory w/o difficulty to [facility] with [employer], belongings given to officers. 

## 2018-09-10 NOTE — ED Notes (Signed)
Pt's son into see

## 2018-09-10 NOTE — ED Notes (Signed)
On the phone 

## 2018-09-10 NOTE — ED Notes (Signed)
Pt resting in bed with eyes open, breakfast at bedside.

## 2018-09-10 NOTE — Progress Notes (Signed)
Jaime Phillips is a 58 year old female admitted on involuntary basis. On admission, she denies SI and is able to contract for safety while in the hospital. She spoke about an anniversary of a loss in her life. She does endorse alcohol use but minimizes how much she is drinking and denies any drug use. She does not display any overt signs or symptoms of withdrawal at this time. She reports that she was living in a shelter and unsure of where she will go once she is discharged. She was escorted to the unit, oriented to the milieu and safety maintained.

## 2018-09-10 NOTE — ED Notes (Signed)
Bed: [facility] Expected date:  Expected time:  Means of arrival:  Comments:
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
## 2018-09-10 NOTE — Progress Notes (Signed)
CSW informed by TTS staff Montel Culver) that patient was accepted to [facility] hall at 4 PM. DNP notified.  Abundio Miu, LCSW Clinical Social Worker [facility] Cell#: [phone]

## 2018-09-10 NOTE — ED Notes (Signed)
[employer] contacted for transport, son and pt are aware that she will transport shortly

## 2018-09-10 NOTE — Progress Notes (Signed)
Patient did attend the first half of the evening speaker [employer] meeting. Pt left group and returned to her room.
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
## 2018-09-10 NOTE — ED Notes (Signed)
Pt ambulatory w/o difficulty from TCU 
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
## 2018-09-10 NOTE — ED Notes (Signed)
Report to [facility] [place], [place] to transport

## 2018-09-10 NOTE — ED Notes (Addendum)
Pt reports that she  Has been depressed and that it has been increasing.  Pt reports significant other died 3 yrs ago and that she has been homeless for th e past, and has had increased depression.  Pt reports that she does not think she has a problem with ETOH. NAD.

## 2018-09-10 NOTE — Tx Team (Signed)
Initial Treatment Plan 09/10/2018 6:15 PM [name] SMO:[id number]    PATIENT STRESSORS: Financial difficulties Substance abuse   PATIENT STRENGTHS: Ability for insight Average or above average intelligence Capable of independent living General fund of knowledge Motivation for treatment/growth   PATIENT IDENTIFIED PROBLEMS: Depression Suicidal thoughts Substance Abuse "I'm not feeling suicidal now"                     DISCHARGE CRITERIA:  Ability to meet basic life and health needs Improved stabilization in mood, thinking, and/or behavior Reduction of life-threatening or endangering symptoms to within safe limits Verbal commitment to aftercare and medication compliance Withdrawal symptoms are absent or subacute and managed without 24-hour nursing intervention  PRELIMINARY DISCHARGE PLAN: Attend aftercare/continuing care group  PATIENT/FAMILY INVOLVEMENT: This treatment plan has been presented to and reviewed with the patient, Jaime Phillips, and/or family member, .  The patient and family have been given the opportunity to ask questions and make suggestions.  Donnelle Olmeda, [place], [place] 09/10/2018, 6:15 PM

## 2018-09-10 NOTE — ED Notes (Addendum)
Dr Tiburcio Pea and Theodoro Clock DNP into see.  Pt reports that she had been drinking last night, but does not remember making remarks about harming herself.  Pt reports that she is homeless and has been staying at the shelter.  Pt states that she is "not trying to harm myself..."  Pt reports that her pain has improved to 7/10.

## 2018-09-10 NOTE — H&P (Signed)
Psychiatric Admission Assessment Adult  Patient Identification: Jaime Phillips MRN:  [id number] Date of Evaluation:  09/10/2018 Chief Complaint:  " I had some confusion with my son " Principal Diagnosis: Alcohol use disorder , alcohol induced mood disorder versus MDD  Diagnosis:  Active Problems:   * No active hospital problems. *  History of Present Illness: 58 year old female.  Presented ( IVC)to ED via [facility] on 2/8 after reportedly trying to jump from a moving vehicle,  making suicidal statements/saying she did not want to live anymore. Chart notes include collateral information from patient's son: report disorganized/dangerous behaviors:  trying to jump out of a moving car, becoming combative, dancing in the middle of the street, making suicidal statements of jumping in front of a train. Patient has a history of alcohol dependence and admission BAL 339.  UDS negative. At this time patient denies/minimizes above.  She states she does not drink daily, , does acknowledge drinking yesterday, but states she had not consumed alcohol for several weeks before then.  States "I guess I had about 3 of those airplane bottles".  She acknowledges depression, which she states is chronic, and which she attributes to psychosocial stressors, mainly homelessness, as well as loss of loved ones.  Her husband passed away 3 years ago, her sister died last year.  Endorses some neurovegetative symptoms as below, but at this time denies suicidal ideations, and denies having made suicidal gestures or statements prior to admission.  Associated Signs/Symptoms: Depression Symptoms:  depressed mood, anhedonia, insomnia, loss of energy/fatigue, (Hypo) Manic Symptoms: None noted or endorsed Anxiety Symptoms: Denies, other than related to apprehension regarding homelessness Psychotic Symptoms: Currently denies-chart notes indicate that patient was talking to her deceased husband.  She denies this.  Does not appear internally  preoccupied at this time no delusions are expressed currently. PTSD Symptoms: Does not endorse Total Time spent with patient: 45 minutes  Past Psychiatric History: History of a prior psychiatric admission in April 2017, here at [facility]. At that time was admitted due to alcohol dependence, alcohol intoxication, suicidal ideations, auditory hallucinations.  Was diagnosed with alcohol dependence, alcohol induced mood disorder, discharged on trazodone. Currently patient describes a history of depression, which she attributes to loss of loved ones and homelessness.  Does not endorse history of mania or hypomania.  Denies history of suicide attempts.  Denies history of psychosis.  Is the patient at risk to self? Yes.    Has the patient been a risk to self in the past 6 months? Yes.    Has the patient been a risk to self within the distant past? Yes.    Is the patient a risk to others? No.  Has the patient been a risk to others in the past 6 months? No.  Has the patient been a risk to others within the distant past? No.   Prior Inpatient Therapy:  As above Prior Outpatient Therapy:  No current outpatient treatment  Alcohol Screening:   Substance Abuse History in the last 12 months: History of alcohol use disorder.  Patient reports drinking in binges, currently appears to minimize severity of alcohol use disorder.  Denies drug abuse. Consequences of Substance Abuse: History of DUI and alcohol-related falls.  Denies history of seizures.  Previous Psychotropic Medications: Patient was not taking any psychiatric medications prior to admission Psychological Evaluations: No Past Medical History:  Past Medical History:  Diagnosis Date  . Abnormal uterine bleeding   . Alcoholism ([place])   . Arthritis   . Cholelithiasis   .  Chronic pain syndrome   . Depression   . Diverticulosis   . Headache(784.0)   . Hiatal hernia   . Hyperplastic colon polyp   . Hypopotassemia   . Internal hemorrhoids   . LFT  elevation   . Macrocytic anemia   . Mallory-Weiss tear   . Potassium (K) deficiency   . PUD (peptic ulcer disease)   . Rectal bleeding    minor     Past Surgical History:  Procedure Laterality Date  . APPENDECTOMY    . COLONOSCOPY    . ESOPHAGOGASTRODUODENOSCOPY N/A 02/06/2014   Procedure: ESOPHAGOGASTRODUODENOSCOPY (EGD);  Surgeon: Milus Banister, MD;  Location: [place];  Service: Endoscopy;  Laterality: N/A;  . OOPHORECTOMY    . ORIF HUMERUS FRACTURE Right 08/10/2016   Procedure: OPEN REDUCTION INTERNAL FIXATION (ORIF) RIGHT PROXIMAL HUMERUS FRACTURE;  Surgeon: Renette Butters, MD;  Location: [place];  Service: Orthopedics;  Laterality: Right;  . ORIF PERIPROSTHETIC FRACTURE Right 05/28/2013   Procedure: OPEN REDUCTION INTERNAL FIXATION (ORIF) PERIPROSTHETIC FRACTURE;  Surgeon: Mauri Pole, MD;  Location: [facility];  Service: Orthopedics;  Laterality: Right;  . SHOULDER SURGERY Left   . TOTAL HIP ARTHROPLASTY Right 04/10/2013   Procedure: RIGHT TOTAL HIP ARTHROPLASTY ANTERIOR APPROACH;  Surgeon: Mauri Pole, MD;  Location: [facility];  Service: Orthopedics;  Laterality: Right;  . TOTAL HIP ARTHROPLASTY     left hip   Family History: Parents deceased mother died from complications of diabetes, father died from MI, 2 brothers, 2 sisters. Family History  Problem Relation Age of Onset  . Breast cancer Sister   . Diabetes Mother   . Kidney disease Mother   . Heart disease Father   . Colon cancer Father        questionable   Family Psychiatric  History: Denies history of mental illness and family, denies history of suicides or of alcohol abuse in family. Tobacco Screening:  (+) smoker Social History: 58 year old female,widowed,has one adult son, currently homeless, has been staying at [facility]. On SSI Social History   Substance and Sexual Activity  Alcohol Use Yes  . Alcohol/week: 2.0 - 3.0 standard drinks  . Types: 2 - 3 Shots of liquor per week   Comment: pt quit drinking 2 months  ago (today is 03/11/15)     Social History   Substance and Sexual Activity  Drug Use No    Additional Social History:  Allergies:  No Known Allergies Lab Results:  Results for orders placed or performed during the hospital encounter of 09/09/18 (from the past 48 hour(s))  Comprehensive metabolic panel     Status: Abnormal   Collection Time: 09/09/18  8:17 PM  Result Value Ref Range   Sodium 145 135 - 145 mmol/L   Potassium 4.1 3.5 - 5.1 mmol/L   Chloride 109 98 - 111 mmol/L   CO2 28 22 - 32 mmol/L   Glucose, Bld 84 70 - 99 mg/dL   BUN 11 6 - 20 mg/dL   Creatinine, Ser 0.55 0.44 - 1.00 mg/dL   Calcium 8.9 8.9 - 10.3 mg/dL   Total Protein 8.4 (H) 6.5 - 8.1 g/dL   Albumin 4.9 3.5 - 5.0 g/dL   AST 39 15 - 41 U/L   ALT 20 0 - 44 U/L   Alkaline Phosphatase 79 38 - 126 U/L   Total Bilirubin 0.3 0.3 - 1.2 mg/dL   GFR calc non Af Amer >60 >60 mL/min   GFR calc Af Amer >60 >  60 mL/min   Anion gap 8 5 - 15    Comment: Performed at [facility], [place] [address], [place], [place] [address]  Ethanol     Status: Abnormal   Collection Time: 09/09/18  8:17 PM  Result Value Ref Range   Alcohol, Ethyl (B) 339 (HH) <10 mg/dL    Comment: CRITICAL RESULT CALLED TO, READ BACK BY AND VERIFIED WITH: R,EBERTSON AT 2100 ON 09/09/18 BY A,MOHAMED (NOTE) Lowest detectable limit for serum alcohol is 10 mg/dL. For medical purposes only. Performed at [facility], [place] [address] [place], [place] [address]   cbc     Status: Abnormal   Collection Time: 09/09/18  8:17 PM  Result Value Ref Range   WBC 10.9 (H) 4.0 - 10.5 K/uL   RBC 4.57 3.87 - 5.11 MIL/uL   Hemoglobin 14.5 12.0 - 15.0 g/dL   HCT 45.6 36.0 - 46.0 %   MCV 99.8 80.0 - 100.0 fL   MCH 31.7 26.0 - 34.0 pg   MCHC 31.8 30.0 - 36.0 g/dL   RDW 15.5 11.5 - 15.5 %   Platelets 335 150 - 400 K/uL   nRBC 0.0 0.0 - 0.2 %    Comment: Performed at [facility], [place] [address], [place],  [place] [address]  Rapid urine drug screen (hospital performed)     Status: None   Collection Time: 09/09/18  8:17 PM  Result Value Ref Range   Opiates NONE DETECTED NONE DETECTED   Cocaine NONE DETECTED NONE DETECTED   Benzodiazepines NONE DETECTED NONE DETECTED   Amphetamines NONE DETECTED NONE DETECTED   Tetrahydrocannabinol NONE DETECTED NONE DETECTED   Barbiturates NONE DETECTED NONE DETECTED    Comment: (NOTE) DRUG SCREEN FOR MEDICAL PURPOSES ONLY.  IF CONFIRMATION IS NEEDED FOR ANY PURPOSE, NOTIFY LAB WITHIN 5 DAYS. LOWEST DETECTABLE LIMITS FOR URINE DRUG SCREEN Drug Class                     Cutoff (ng/mL) Amphetamine and metabolites    1000 Barbiturate and metabolites    200 Benzodiazepine                 671 Tricyclics and metabolites     300 Opiates and metabolites        300 Cocaine and metabolites        300 THC                            50 Performed at [facility], [place] [address], [place], [place] [address]   I-Stat beta hCG blood, ED     Status: None   Collection Time: 09/09/18  8:36 PM  Result Value Ref Range   I-stat hCG, quantitative <5.0 <5 mIU/mL   Comment 3            Comment:   GEST. AGE      CONC.  (mIU/mL)   <=1 WEEK        5 - 50     2 WEEKS       50 - 500     3 WEEKS       100 - 10,000     4 WEEKS     1,000 - 30,000        FEMALE AND NON-PREGNANT FEMALE:     LESS THAN 5 mIU/mL     Blood Alcohol level:  Lab  Results  Component Value Date   ETH 339 (HH) 09/09/2018   ETH 273 (H) 37/90/2409    Metabolic Disorder Labs:  Lab Results  Component Value Date   HGBA1C 5.1 11/07/2015   MPG 100 11/07/2015   No results found for: PROLACTIN Lab Results  Component Value Date   CHOL 224 (H) 01/17/2014   TRIG 83 01/17/2014   HDL 99 01/17/2014   CHOLHDL 2.3 01/17/2014   VLDL 17 01/17/2014   LDLCALC 108 (H) 01/17/2014    Current Medications: No current facility-administered medications for this encounter.    PTA  Medications: Medications Prior to Admission  Medication Sig Dispense Refill Last Dose  . acetaminophen (TYLENOL) 500 MG tablet Take 1,000 mg by mouth every 6 (six) hours as needed for mild pain.   Past Week at Unknown time  . aspirin 325 MG tablet Take 325 mg by mouth daily.   Past Week at Unknown time  . FLOVENT HFA 110 MCG/ACT inhaler Inhale 1 puff into the lungs 2 (two) times daily.   09/09/2018 at Unknown time  . ibuprofen (ADVIL,MOTRIN) 200 MG tablet Take 600 mg by mouth every 6 (six) hours as needed for headache or mild pain.    09/09/2018 at Unknown time  . Multiple Vitamins-Minerals (MULTIVITAMIN WOMEN 50+) TABS Take 1 tablet by mouth daily.   Past Month at Unknown time  . pantoprazole (PROTONIX) 20 MG tablet Take 1 tablet (20 mg total) by mouth daily. 90 tablet 3 09/09/2018 at Unknown time  . Vitamin D, Ergocalciferol, (DRISDOL) 1.25 MG (50000 UT) CAPS capsule Take 50,000 Units by mouth every 7 (seven) days.    09/04/2018  . vitamin E (VITAMIN E) 400 UNIT capsule Take 400 Units by mouth daily.   09/09/2018 at Unknown time    Musculoskeletal: Strength & Muscle Tone: within normal limits Gait & Station: walks slowly which she attributes to history of bilateral hip replacement  Patient leans: N/A  Psychiatric Specialty Exam: Physical Exam  Review of Systems  Constitutional: Negative.   HENT: Negative.   Eyes: Negative.   Respiratory: Negative.   Cardiovascular: Negative.   Gastrointestinal: Negative.   Genitourinary: Negative.   Musculoskeletal:       Hip pain  Skin: Negative.   Neurological: Negative.  Negative for seizures.  Endo/Heme/Allergies: Negative.   Psychiatric/Behavioral: Positive for depression and substance abuse.  All other systems reviewed and are negative.   There were no vitals taken for this visit.There is no height or weight on file to calculate BMI.  General Appearance: Fairly Groomed  Eye Contact:  Fair  Speech:  Normal Rate  Volume:  Normal  Mood:   Depressed, but states feeling better than yesterday  Affect:  Constricted but smiles briefly/appropriately at times  Thought Process:  Linear and Descriptions of Associations: Intact  Orientation:  Full (Time, Place, and Person)-presents fully alert and oriented x3 at this time  Thought Content:  Denies hallucinations, no delusions expressed  Suicidal Thoughts:  No denies suicidal or self-injurious ideations, no homicidal or violent ideations  Homicidal Thoughts:  No  Memory:  Recent and remote grossly intact  Judgement:  Fair  Insight:  Fair  Psychomotor Activity:  No significant psychomotor agitation at this time, no overt restlessness, minimal distal tremors, no diaphoresis  Concentration:  Concentration: Good and Attention Span: Good  Recall:  Good  Fund of Knowledge:  Good  Language:  Good  Akathisia:  Negative  Handed:  Right  AIMS (if indicated):  Assets:  Desire for Improvement Resilience  ADL's:  Intact  Cognition:  WNL  Sleep:       Treatment Plan Summary: Daily contact with patient to assess and evaluate symptoms and progress in treatment, Medication management, Plan Inpatient treatment and Medications as below  Observation Level/Precautions:  15 minute checks  Laboratory: TSH, Lipid Panel, HgbA1C  Psychotherapy: Milieu/group therapy  Medications:  Ativan detox protocol to minimize risk of alcohol WDL symptoms. We discussed starting antidepressant medication but at this time patient declines, states she feels her mood will improve without need for medication.   Consultations: As needed  Discharge Concerns:  -  Estimated LOS: 4-5 days  Other:     Physician Treatment Plan for Primary Diagnosis: Alcohol dependence  Long Term Goal(s): Improvement in symptoms so as ready for discharge  Short Term Goals: Ability to identify triggers associated with substance abuse/mental health issues will improve  Physician Treatment Plan for Secondary Diagnosis: MDD versus alcohol  induced mood disorder Long Term Goal(s): Improvement in symptoms so as ready for discharge  Short Term Goals: Ability to identify changes in lifestyle to reduce recurrence of condition will improve and Ability to maintain clinical measurements within normal limits will improve  I certify that inpatient services furnished can reasonably be expected to improve the patient's condition.    Jenne Campus, MD 2/9/20205:15 PM

## 2018-09-10 NOTE — B[facility] Counselor (Signed)
Clinician observed the pt is currently sleeping. TTS to be completed during day shift once the pt is alert, roused and able to engage in TTS assessment.    Vertell Novak, [place], [facility], [facility] Triage Specialist [phone]

## 2018-09-10 NOTE — B[facility] Counselor (Signed)
Clinician observed the pt sleeping. TTS to continue to monitor. TTS assessment will be completed once pt is alert, roused and able to engage.   Vertell Novak, [place], [facility], [facility] Triage Specialist [phone]

## 2018-09-10 NOTE — Progress Notes (Signed)
D: Patient observed up and restless on unit. Attended some of AA but complained to MHT she had restlessness, cramping in legs. Patient states to this writer, "no, I wasn't suicidal at all before I got here." Patient's affect animated, mood anxious, depressed. Denies pain, physical complaints. No withdrawal reported though BP remains elevated.   A: Medicated per orders, prns offered but refused. Medication education provided. Level III obs in place for safety. Emotional support offered. Patient encouraged to complete Suicide Safety Plan before discharge. Encouraged to attend and participate in unit programming.  Fall prevention plan in place and reviewed with patient as pt is a high fall risk due to detox.   R: Patient verbalizes understanding of POC, falls prevention education. Patient denies SI/HI/AVH and remains safe on level III obs. Will continue to monitor throughout the night.

## 2018-09-11 DIAGNOSIS — F332 Major depressive disorder, recurrent severe without psychotic features: Secondary | ICD-10-CM

## 2018-09-11 LAB — LIPID PANEL
CHOL/HDL RATIO: 2.4 ratio
Cholesterol: 210 mg/dL — ABNORMAL HIGH (ref 0–200)
HDL: 88 mg/dL (ref >40)
LDL Cholesterol: 98 mg/dL (ref 0–99)
Triglycerides: 122 mg/dL (ref <150)
VLDL: 24 mg/dL (ref 0–40)

## 2018-09-11 LAB — HEMOGLOBIN A1C
Hgb A1c MFr Bld: 5.6 % (ref 4.8–5.6)
Mean Plasma Glucose: 114.02 mg/dL

## 2018-09-11 LAB — TSH: TSH: 1.3 u[IU]/mL (ref 0.350–4.500)

## 2018-09-11 MED ORDER — BENZOCAINE 10 % MT GEL
Freq: Two times a day (BID) | OROMUCOSAL | Status: DC | PRN
  Filled 2018-09-11: qty 9.4

## 2018-09-11 MED ORDER — FLUOXETINE HCL 20 MG PO CAPS
20.0000 mg | ORAL_CAPSULE | Freq: Every day | ORAL | Status: DC
  Filled 2018-09-11 (×3): qty 1

## 2018-09-11 MED ORDER — METOPROLOL SUCCINATE ER 50 MG PO TB24
50.0000 mg | ORAL_TABLET | Freq: Every day | ORAL | Status: DC
  Administered 2018-09-11 – 2018-09-12 (×2): 50 mg via ORAL
  Filled 2018-09-11 (×3): qty 1

## 2018-09-11 MED ORDER — FLUTICASONE PROPIONATE HFA 110 MCG/ACT IN AERO
2.0000 | INHALATION_SPRAY | Freq: Two times a day (BID) | RESPIRATORY_TRACT | Status: DC
  Administered 2018-09-11 – 2018-09-12 (×3): 2 via RESPIRATORY_TRACT
  Filled 2018-09-11: qty 12

## 2018-09-11 NOTE — Progress Notes (Signed)
[facility] MD Progress Note  09/11/2018 8:04 AM Jaime Phillips  MRN:  [id number] Subjective:    Patient continues to deny any dangerousness on my exam she is focused on her "2 hip surgeries" and need for treatment of pain however it is likely she did have an alcohol-related blackout, she continues to deny and minimize all issues related alcoholism yet video evidence, collateral information indicate obvious dangerousness and again she must of been acting well in the context of an alcoholic blackout when she was in the street, threatening suicide so forth. Detox is underway however blood pressure is slightly Utilizing the lorazepam protocol Patient likely homeless as well we will discuss with team probable need for rehab  Principal Problem: <principal problem not specified> Diagnosis: Active Problems:   Major depressive disorder, recurrent severe without psychotic features ([place])  Total Time spent with patient: 20 minutes   Past Medical History:  Past Medical History:  Diagnosis Date  . Abnormal uterine bleeding   . Alcoholism ([place])   . Arthritis   . Cholelithiasis   . Chronic pain syndrome   . Depression   . Diverticulosis   . Headache(784.0)   . Hiatal hernia   . Hyperplastic colon polyp   . Hypopotassemia   . Internal hemorrhoids   . LFT elevation   . Macrocytic anemia   . Mallory-Weiss tear   . Potassium (K) deficiency   . PUD (peptic ulcer disease)   . Rectal bleeding    minor     Past Surgical History:  Procedure Laterality Date  . APPENDECTOMY    . COLONOSCOPY    . ESOPHAGOGASTRODUODENOSCOPY N/A 02/06/2014   Procedure: ESOPHAGOGASTRODUODENOSCOPY (EGD);  Surgeon: Milus Banister, MD;  Location: [place];  Service: Endoscopy;  Laterality: N/A;  . OOPHORECTOMY    . ORIF HUMERUS FRACTURE Right 08/10/2016   Procedure: OPEN REDUCTION INTERNAL FIXATION (ORIF) RIGHT PROXIMAL HUMERUS FRACTURE;  Surgeon: Renette Butters, MD;  Location: [place];  Service: Orthopedics;  Laterality:  Right;  . ORIF PERIPROSTHETIC FRACTURE Right 05/28/2013   Procedure: OPEN REDUCTION INTERNAL FIXATION (ORIF) PERIPROSTHETIC FRACTURE;  Surgeon: Mauri Pole, MD;  Location: [facility];  Service: Orthopedics;  Laterality: Right;  . SHOULDER SURGERY Left   . TOTAL HIP ARTHROPLASTY Right 04/10/2013   Procedure: RIGHT TOTAL HIP ARTHROPLASTY ANTERIOR APPROACH;  Surgeon: Mauri Pole, MD;  Location: [facility];  Service: Orthopedics;  Laterality: Right;  . TOTAL HIP ARTHROPLASTY     left hip   Family History:  Family History  Problem Relation Age of Onset  . Breast cancer Sister   . Diabetes Mother   . Kidney disease Mother   . Heart disease Father   . Colon cancer Father        questionable  Social History:  Social History   Substance and Sexual Activity  Alcohol Use Yes  . Alcohol/week: 2.0 - 3.0 standard drinks  . Types: 2 - 3 Shots of liquor per week   Comment: pt quit drinking 2 months ago (today is 03/11/15)     Social History   Substance and Sexual Activity  Drug Use No    Social History   Socioeconomic History  . Marital status: Single    Spouse name: Not on file  . Number of children: 1  . Years of education: Not on file  . Highest education level: Not on file  Occupational History  . Occupation: disabled  Social Needs  . Financial resource strain: Not on file  .  Food insecurity:    Worry: Not on file    Inability: Not on file  . Transportation needs:    Medical: Not on file    Non-medical: Not on file  Tobacco Use  . Smoking status: Current Every Day Smoker    Packs/day: 0.50    Years: 25.00    Pack years: 12.50    Types: Cigarettes  . Smokeless tobacco: Never Used  . Tobacco comment: Tobacco info given to pt. 08/16/12  Substance and Sexual Activity  . Alcohol use: Yes    Alcohol/week: 2.0 - 3.0 standard drinks    Types: 2 - 3 Shots of liquor per week    Comment: pt quit drinking 2 months ago (today is 03/11/15)  . Drug use: No  . Sexual activity: Not  Currently  Lifestyle  . Physical activity:    Days per week: Not on file    Minutes per session: Not on file  . Stress: Not on file  Relationships  . Social connections:    Talks on phone: Not on file    Gets together: Not on file    Attends religious service: Not on file    Active member of club or organization: Not on file    Attends meetings of clubs or organizations: Not on file    Relationship status: Not on file  Other Topics Concern  . Not on file  Social History Narrative  . Not on file   Additional Social History:                         Sleep: Fair  Appetite:  Fair  Current Medications: Current Facility-Administered Medications  Medication Dose Route Frequency Provider Last Rate Last Dose  . acetaminophen (TYLENOL) tablet 650 mg  650 mg Oral Q6H PRN Patrecia Pour, NP      . alum & mag hydroxide-simeth (MAALOX/MYLANTA) 200-200-20 MG/5ML suspension 30 mL  30 mL Oral Q4H PRN Patrecia Pour, NP      . feeding supplement (ENSURE ENLIVE) (ENSURE ENLIVE) liquid 237 mL  237 mL Oral BID BM Cobos, Myer Peer, MD      . FLUoxetine (PROZAC) capsule 20 mg  20 mg Oral Daily Johnn Hai, MD      . folic acid (FOLVITE) tablet 1 mg  1 mg Oral Daily Lord, Jamison Y, NP      . gabapentin (NEURONTIN) capsule 100 mg  100 mg Oral TID Cobos, Myer Peer, MD   100 mg at 09/10/18 1836  . hydrOXYzine (ATARAX/VISTARIL) tablet 25 mg  25 mg Oral Q6H PRN Lindon Romp A, NP      . ibuprofen (ADVIL,MOTRIN) tablet 600 mg  600 mg Oral Q6H PRN Patrecia Pour, NP   600 mg at 09/11/18 1443  . loperamide (IMODIUM) capsule 2-4 mg  2-4 mg Oral PRN Lindon Romp A, NP      . LORazepam (ATIVAN) tablet 1 mg  1 mg Oral Q6H PRN Lindon Romp A, NP      . LORazepam (ATIVAN) tablet 1 mg  1 mg Oral QID Lindon Romp A, NP   1 mg at 09/10/18 2130   Followed by  . [START ON 09/12/2018] LORazepam (ATIVAN) tablet 1 mg  1 mg Oral TID Rozetta Nunnery, NP       Followed by  . [START ON 09/13/2018] LORazepam  (ATIVAN) tablet 1 mg  1 mg Oral BID Rozetta Nunnery, NP  Followed by  . [START ON 09/15/2018] LORazepam (ATIVAN) tablet 1 mg  1 mg Oral Daily Lindon Romp A, NP      . magnesium hydroxide (MILK OF MAGNESIA) suspension 30 mL  30 mL Oral Daily PRN Patrecia Pour, NP      . metoprolol succinate (TOPROL-XL) 24 hr tablet 50 mg  50 mg Oral Daily Johnn Hai, MD      . multivitamin with minerals tablet 1 tablet  1 tablet Oral Daily Reita Cliche, Jamison Y, NP      . nicotine (NICODERM CQ - dosed in mg/24 hours) patch 14 mg  14 mg Transdermal Daily Patrecia Pour, NP      . ondansetron (ZOFRAN-ODT) disintegrating tablet 4 mg  4 mg Oral Q6H PRN Rozetta Nunnery, NP      . thiamine (VITAMIN B-1) tablet 100 mg  100 mg Oral Daily Patrecia Pour, NP       Or  . thiamine (B-1) injection 100 mg  100 mg Intravenous Daily Patrecia Pour, NP        Lab Results:  Results for orders placed or performed during the hospital encounter of 09/09/18 (from the past 48 hour(s))  Comprehensive metabolic panel     Status: Abnormal   Collection Time: 09/09/18  8:17 PM  Result Value Ref Range   Sodium 145 135 - 145 mmol/L   Potassium 4.1 3.5 - 5.1 mmol/L   Chloride 109 98 - 111 mmol/L   CO2 28 22 - 32 mmol/L   Glucose, Bld 84 70 - 99 mg/dL   BUN 11 6 - 20 mg/dL   Creatinine, Ser 0.55 0.44 - 1.00 mg/dL   Calcium 8.9 8.9 - 10.3 mg/dL   Total Protein 8.4 (H) 6.5 - 8.1 g/dL   Albumin 4.9 3.5 - 5.0 g/dL   AST 39 15 - 41 U/L   ALT 20 0 - 44 U/L   Alkaline Phosphatase 79 38 - 126 U/L   Total Bilirubin 0.3 0.3 - 1.2 mg/dL   GFR calc non Af Amer >60 >60 mL/min   GFR calc Af Amer >60 >60 mL/min   Anion gap 8 5 - 15    Comment: Performed at [facility], [place] [address], [place], [place] [address]  Ethanol     Status: Abnormal   Collection Time: 09/09/18  8:17 PM  Result Value Ref Range   Alcohol, Ethyl (B) 339 (HH) <10 mg/dL    Comment: CRITICAL RESULT CALLED TO, READ BACK BY AND VERIFIED  WITH: R,EBERTSON AT 2100 ON 09/09/18 BY A,MOHAMED (NOTE) Lowest detectable limit for serum alcohol is 10 mg/dL. For medical purposes only. Performed at [facility], [place] [address] [place], [place] [address]   cbc     Status: Abnormal   Collection Time: 09/09/18  8:17 PM  Result Value Ref Range   WBC 10.9 (H) 4.0 - 10.5 K/uL   RBC 4.57 3.87 - 5.11 MIL/uL   Hemoglobin 14.5 12.0 - 15.0 g/dL   HCT 45.6 36.0 - 46.0 %   MCV 99.8 80.0 - 100.0 fL   MCH 31.7 26.0 - 34.0 pg   MCHC 31.8 30.0 - 36.0 g/dL   RDW 15.5 11.5 - 15.5 %   Platelets 335 150 - 400 K/uL   nRBC 0.0 0.0 - 0.2 %    Comment: Performed at [facility], [place] [address], [place], [place] [address]  Rapid urine drug screen (hospital performed)  Status: None   Collection Time: 09/09/18  8:17 PM  Result Value Ref Range   Opiates NONE DETECTED NONE DETECTED   Cocaine NONE DETECTED NONE DETECTED   Benzodiazepines NONE DETECTED NONE DETECTED   Amphetamines NONE DETECTED NONE DETECTED   Tetrahydrocannabinol NONE DETECTED NONE DETECTED   Barbiturates NONE DETECTED NONE DETECTED    Comment: (NOTE) DRUG SCREEN FOR MEDICAL PURPOSES ONLY.  IF CONFIRMATION IS NEEDED FOR ANY PURPOSE, NOTIFY LAB WITHIN 5 DAYS. LOWEST DETECTABLE LIMITS FOR URINE DRUG SCREEN Drug Class                     Cutoff (ng/mL) Amphetamine and metabolites    1000 Barbiturate and metabolites    200 Benzodiazepine                 465 Tricyclics and metabolites     300 Opiates and metabolites        300 Cocaine and metabolites        300 THC                            50 Performed at [facility], [place] [address], [place], [place] [address]   I-Stat beta hCG blood, ED     Status: None   Collection Time: 09/09/18  8:36 PM  Result Value Ref Range   I-stat hCG, quantitative <5.0 <5 mIU/mL   Comment 3            Comment:   GEST. AGE      CONC.  (mIU/mL)   <=1 WEEK        5 - 50     2 WEEKS       50  - 500     3 WEEKS       100 - 10,000     4 WEEKS     1,000 - 30,000        FEMALE AND NON-PREGNANT FEMALE:     LESS THAN 5 mIU/mL     Blood Alcohol level:  Lab Results  Component Value Date   ETH 339 (HH) 09/09/2018   ETH 273 (H) 56/81/2751    Metabolic Disorder Labs: Lab Results  Component Value Date   HGBA1C 5.1 11/07/2015   MPG 100 11/07/2015   No results found for: PROLACTIN Lab Results  Component Value Date   CHOL 224 (H) 01/17/2014   TRIG 83 01/17/2014   HDL 99 01/17/2014   CHOLHDL 2.3 01/17/2014   VLDL 17 01/17/2014   LDLCALC 108 (H) 01/17/2014    Physical Findings: AIMS: Facial and Oral Movements Muscles of Facial Expression: None, normal Lips and Perioral Area: None, normal Jaw: None, normal Tongue: None, normal,Extremity Movements Upper (arms, wrists, hands, fingers): None, normal Lower (legs, knees, ankles, toes): None, normal, Trunk Movements Neck, shoulders, hips: None, normal, Overall Severity Severity of abnormal movements (highest score from questions above): None, normal Incapacitation due to abnormal movements: None, normal Patient's awareness of abnormal movements (rate only patient's report): No Awareness, Dental Status Current problems with teeth and/or dentures?: No Does patient usually wear dentures?: No  CIWA:  CIWA-Ar Total: 2 COWS:     Musculoskeletal: Strength & Muscle Tone: within normal limits Gait & Station: normal Patient leans: N/A  Psychiatric Specialty Exam: Physical Exam  ROS  Blood pressure (!) 124/95, pulse (!) 118, temperature 98.6 F (37 C), temperature source Oral, resp. rate 18, height 5' 2.5" (  1.588 m), weight 49 kg.Body mass index is 19.44 kg/m.  General Appearance: Casual  Eye Contact:  Fair  Speech:  Clear and Coherent  Volume:  Decreased  Mood:  Dysphoric  Affect:  Congruent  Thought Process:  Goal Directed  Orientation:  Full (Time, Place, and Person)  Thought Content:  Tangential  Suicidal Thoughts:   No  Homicidal Thoughts:  No  Memory:  Immediate;   Fair  Judgement:  Impaired  Insight:  Lacking  Psychomotor Activity:  Decreased  Concentration:  Concentration: Fair  Recall:  [employer] of Knowledge:  Fair  Language:  Fair  Akathisia:  Negative  Handed:  Right  AIMS (if indicated):     Assets:  Resilience Social Support  ADL's:  Intact  Cognition:  WNL  Sleep:  Number of Hours: 6.75     Treatment Plan Summary: Daily contact with patient to assess and evaluate symptoms and progress in treatment, Medication management and Plan Continue lorazepam detox protocol continue cognitive and group therapy, continue current precautions seek rehab  Johnn Hai, MD 09/11/2018, 8:04 AM

## 2018-09-11 NOTE — Progress Notes (Signed)
The patient rated her day as a 7 out of 10. She states that her pain medications were effective and have helped her to feel better. Her goal for tomorrow is to get discharged which she states has already been scheduled.

## 2018-09-11 NOTE — Progress Notes (Signed)
Patient attended grief and loss group facilitated by Kerry Hough, MS, [facility], [place].   Group focuses on change and loss experienced by group members; topics include loss due to death, loss of relationships, loss of a place, loss of sense of self, etc. Group members are invited to share the changes and losses that are currently affecting their lives. Facilitators tie together shared experiences between group members and validate emotions felt by participants.  Patient Jaime Phillips attended group. Pt did not share during group time as she reported she was having mouth pain. Pt appeared attentive in group as evident by tracking other group members with her eyes and nodding her head in agreement with statements shared.

## 2018-09-11 NOTE — Progress Notes (Signed)
Recreation Therapy Notes  Date:  2.10.20 Time: 0930 Location: [address] Dayroom  Group Topic: Stress Management  Goal Area(s) Addresses:  Patient will identify positive stress management techniques. Patient will identify benefits of using stress management post d/c.  Behavioral Response: Engaged  Intervention: Stress Management  Activity :  Meditation.  LRT introduced the stress management technique of meditation.  LRT played a meditation that focused on being resilient in the face of adversity.  Patients were to listen as meditation played to engage in activity.    Education:  Stress Management, Discharge Planning.   Education Outcome: Acknowledges Education  Clinical Observations/Feedback:  Pt attended and participated in activity.    Victorino Sparrow, LRT/CTRS    Victorino Sparrow A 09/11/2018 12:09 PM

## 2018-09-11 NOTE — [facility] Group Notes (Signed)
LCSW Group Therapy Note   09/11/2018 1:15pm   Type of Therapy and Topic:  Group Therapy:  Overcoming Obstacles   Participation Level:  None   Description of Group:    In this group patients will be encouraged to explore what they see as obstacles to their own wellness and recovery. They will be guided to discuss their thoughts, feelings, and behaviors related to these obstacles. The group will process together ways to cope with barriers, with attention given to specific choices patients can make. Each patient will be challenged to identify changes they are motivated to make in order to overcome their obstacles. This group will be process-oriented, with patients participating in exploration of their own experiences as well as giving and receiving support and challenge from other group members.   Therapeutic Goals: 1. Patient will identify personal and current obstacles as they relate to admission. 2. Patient will identify barriers that currently interfere with their wellness or overcoming obstacles.  3. Patient will identify feelings, thought process and behaviors related to these barriers. 4. Patient will identify two changes they are willing to make to overcome these obstacles:      Summary of Patient Progress   Pt slept in group room. Difficult to rouse. Pt apologetic for being drowsy.   Therapeutic Modalities:   Cognitive Behavioral Therapy Solution Focused Therapy Motivational Interviewing Relapse Prevention Therapy  Avelina Laine, LCSW 09/11/2018 3:20 PM

## 2018-09-11 NOTE — Tx Team (Signed)
Interdisciplinary Treatment and Diagnostic Plan Update  09/11/2018 Time of Session: 0830AM Jaime Phillips MRN: [id number]  Principal Diagnosis: MDD, recurrent, severe without psychosis  Secondary Diagnoses: Active Problems:   Major depressive disorder, recurrent severe without psychotic features ([place])   Current Medications:  Current Facility-Administered Medications  Medication Dose Route Frequency Provider Last Rate Last Dose  . acetaminophen (TYLENOL) tablet 650 mg  650 mg Oral Q6H PRN Patrecia Pour, NP      . alum & mag hydroxide-simeth (MAALOX/MYLANTA) 200-200-20 MG/5ML suspension 30 mL  30 mL Oral Q4H PRN Patrecia Pour, NP      . feeding supplement (ENSURE ENLIVE) (ENSURE ENLIVE) liquid 237 mL  237 mL Oral BID BM Cobos, Myer Peer, MD      . FLUoxetine (PROZAC) capsule 20 mg  20 mg Oral Daily Johnn Hai, MD      . folic acid (FOLVITE) tablet 1 mg  1 mg Oral Daily Patrecia Pour, NP   1 mg at 09/11/18 8841  . gabapentin (NEURONTIN) capsule 100 mg  100 mg Oral TID Cobos, Myer Peer, MD   100 mg at 09/11/18 6606  . hydrOXYzine (ATARAX/VISTARIL) tablet 25 mg  25 mg Oral Q6H PRN Lindon Romp A, NP      . ibuprofen (ADVIL,MOTRIN) tablet 600 mg  600 mg Oral Q6H PRN Patrecia Pour, NP   600 mg at 09/11/18 3016  . loperamide (IMODIUM) capsule 2-4 mg  2-4 mg Oral PRN Lindon Romp A, NP      . LORazepam (ATIVAN) tablet 1 mg  1 mg Oral Q6H PRN Lindon Romp A, NP      . LORazepam (ATIVAN) tablet 1 mg  1 mg Oral QID Lindon Romp A, NP   1 mg at 09/10/18 2130   Followed by  . [START ON 09/12/2018] LORazepam (ATIVAN) tablet 1 mg  1 mg Oral TID Rozetta Nunnery, NP       Followed by  . [START ON 09/13/2018] LORazepam (ATIVAN) tablet 1 mg  1 mg Oral BID Rozetta Nunnery, NP       Followed by  . [START ON 09/15/2018] LORazepam (ATIVAN) tablet 1 mg  1 mg Oral Daily Lindon Romp A, NP      . magnesium hydroxide (MILK OF MAGNESIA) suspension 30 mL  30 mL Oral Daily PRN Patrecia Pour, NP      .  metoprolol succinate (TOPROL-XL) 24 hr tablet 50 mg  50 mg Oral Daily Johnn Hai, MD      . multivitamin with minerals tablet 1 tablet  1 tablet Oral Daily Patrecia Pour, NP   1 tablet at 09/11/18 [phone]  . nicotine (NICODERM CQ - dosed in mg/24 hours) patch 14 mg  14 mg Transdermal Daily Patrecia Pour, NP   14 mg at 09/11/18 0820  . ondansetron (ZOFRAN-ODT) disintegrating tablet 4 mg  4 mg Oral Q6H PRN Lindon Romp A, NP      . thiamine (VITAMIN B-1) tablet 100 mg  100 mg Oral Daily Patrecia Pour, NP   100 mg at 09/11/18 3235   Or  . thiamine (B-1) injection 100 mg  100 mg Intravenous Daily Patrecia Pour, NP       PTA Medications: Medications Prior to Admission  Medication Sig Dispense Refill Last Dose  . acetaminophen (TYLENOL) 500 MG tablet Take 1,000 mg by mouth every 6 (six) hours as needed for mild pain.   Past Week at Unknown time  .  aspirin 325 MG tablet Take 325 mg by mouth daily.   Past Week at Unknown time  . FLOVENT HFA 110 MCG/ACT inhaler Inhale 1 puff into the lungs 2 (two) times daily.   09/09/2018 at Unknown time  . ibuprofen (ADVIL,MOTRIN) 200 MG tablet Take 600 mg by mouth every 6 (six) hours as needed for headache or mild pain.    09/09/2018 at Unknown time  . Multiple Vitamins-Minerals (MULTIVITAMIN WOMEN 50+) TABS Take 1 tablet by mouth daily.   Past Month at Unknown time  . pantoprazole (PROTONIX) 20 MG tablet Take 1 tablet (20 mg total) by mouth daily. 90 tablet 3 09/09/2018 at Unknown time  . Vitamin D, Ergocalciferol, (DRISDOL) 1.25 MG (50000 UT) CAPS capsule Take 50,000 Units by mouth every 7 (seven) days.    09/04/2018  . vitamin E (VITAMIN E) 400 UNIT capsule Take 400 Units by mouth daily.   09/09/2018 at Unknown time    Patient Stressors: Financial difficulties Substance abuse  Patient Strengths: Ability for insight Average or above average intelligence Capable of independent living [employer] of knowledge Motivation for treatment/growth  Treatment  Modalities: Medication Management, Group therapy, Case management,  1 to 1 session with clinician, Psychoeducation, Recreational therapy.   Physician Treatment Plan for Primary Diagnosis: MDD, recurrent, severe without psychosis  Long Term Goal(s): Improvement in symptoms so as ready for discharge Improvement in symptoms so as ready for discharge   Short Term Goals: Ability to identify triggers associated with substance abuse/mental health issues will improve Ability to identify changes in lifestyle to reduce recurrence of condition will improve Ability to maintain clinical measurements within normal limits will improve  Medication Management: Evaluate patient's response, side effects, and tolerance of medication regimen.  Therapeutic Interventions: 1 to 1 sessions, Unit Group sessions and Medication administration.  Evaluation of Outcomes: Progressing  Physician Treatment Plan for Secondary Diagnosis: Active Problems:   Major depressive disorder, recurrent severe without psychotic features ([place])  Long Term Goal(s): Improvement in symptoms so as ready for discharge Improvement in symptoms so as ready for discharge   Short Term Goals: Ability to identify triggers associated with substance abuse/mental health issues will improve Ability to identify changes in lifestyle to reduce recurrence of condition will improve Ability to maintain clinical measurements within normal limits will improve     Medication Management: Evaluate patient's response, side effects, and tolerance of medication regimen.  Therapeutic Interventions: 1 to 1 sessions, Unit Group sessions and Medication administration.  Evaluation of Outcomes: Progressing   RN Treatment Plan for Primary Diagnosis: MDD, recurrent, severe without psychosis  Long Term Goal(s): Knowledge of disease and therapeutic regimen to maintain health will improve  Short Term Goals: Ability to remain free from injury will improve, Ability  to verbalize frustration and anger appropriately will improve, Ability to demonstrate self-control and Ability to disclose and discuss suicidal ideas  Medication Management: RN will administer medications as ordered by provider, will assess and evaluate patient's response and provide education to patient for prescribed medication. RN will report any adverse and/or side effects to prescribing provider.  Therapeutic Interventions: 1 on 1 counseling sessions, Psychoeducation, Medication administration, Evaluate responses to treatment, Monitor vital signs and CBGs as ordered, Perform/monitor CIWA, COWS, AIMS and Fall Risk screenings as ordered, Perform wound care treatments as ordered.  Evaluation of Outcomes: Progressing   LCSW Treatment Plan for Primary Diagnosis: MDD, recurrent, severe without psychosis  Long Term Goal(s): Safe transition to appropriate next level of care at discharge, Engage patient in  therapeutic group addressing interpersonal concerns.  Short Term Goals: Engage patient in aftercare planning with referrals and resources, Increase emotional regulation, Identify triggers associated with mental health/substance abuse issues and Increase skills for wellness and recovery  Therapeutic Interventions: Assess for all discharge needs, 1 to 1 time with Social worker, Explore available resources and support systems, Assess for adequacy in community support network, Educate family and significant other(s) on suicide prevention, Complete Psychosocial Assessment, Interpersonal group therapy.  Evaluation of Outcomes: Progressing   Progress in Treatment: Attending groups: Yes. Participating in groups: Yes. Taking medication as prescribed: Yes. Toleration medication: Yes. Family/Significant other contact made: No, will contact:  family member if pt consents to collateral contact.  Patient understands diagnosis: Yes. Discussing patient identified problems/goals with staff: Yes. Medical  problems stabilized or resolved: Yes. Denies suicidal/homicidal ideation: Yes. Issues/concerns per patient self-inventory: No. Other: n/a   New problem(s) identified: No, Describe:  n/a  New Short Term/Long Term Goal(s): detox, medication management for mood stabilization; elimination of SI thoughts; development of comprehensive mental wellness/sobriety plan.   Patient Goals:  "I guess to work on depression. I'm not suicidal."   Discharge Plan or Barriers: CSW assessing for appropriate referrals. [place] pamphlet, Mobile Crisis information, and AA/NA information provided to patient for additional community support and resources.   Reason for Continuation of Hospitalization: Anxiety Depression Medication stabilization Suicidal ideation Withdrawal symptoms  Estimated Length of Stay: Wed, 09/13/2018  Attendees: Patient: Jaime Phillips 09/11/2018 11:16 AM  Physician: Dr. Jake Samples MD 09/11/2018 11:16 AM  Nursing: Yetta Flock RN; [facility] RN 09/11/2018 11:16 AM  RN Care Manager:x 09/11/2018 11:16 AM  Social Worker: Janice Norrie LCSW 09/11/2018 11:16 AM  Recreational Therapist: x 09/11/2018 11:16 AM  Other: Harriett Sine NP 09/11/2018 11:16 AM  Other:  09/11/2018 11:16 AM  Other: 09/11/2018 11:16 AM    Scribe for Treatment Team: Avelina Laine, LCSW 09/11/2018 11:16 AM

## 2018-09-11 NOTE — Progress Notes (Signed)
Patient ID: Jaime Phillips, female   DOB: 11/30/1960, 58 y.o.   MRN: [id number]   D: Patient pleasant on approach tonight. Reports some depression today but denies any active SI. Remains on the ativan protocol due to drinking prior to admission. Minimal withdrawals noted tonight but elevated blood pressure. A: Staff will monitor on q 15 minute checks follow treatment plan, and give medications as ordered. R: Cooperative and attending group tonight.

## 2018-09-11 NOTE — Plan of Care (Addendum)
Patient self inventory- Patient slept fair last night, sleep medication was requested and was helpful. Appetite is good, energy level normal, concentration good. Depression, hopelessness, and anxiety rated 5, 8, 0. Endorses cramping, lightheadedness, and pain. Denies SI HI AVH. Patient is compliant with medications prescribed per provider. No side effects noted. Safety is maintained with 15 minute checks as well as environmental checks. Will continue to monitor.  Problem: Education: Goal: Emotional status will improve Outcome: Progressing Goal: Mental status will improve Outcome: Progressing Goal: Verbalization of understanding the information provided will improve Outcome: Progressing   Problem: Activity: Goal: Interest or engagement in activities will improve Outcome: Progressing
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
## 2018-09-11 NOTE — Progress Notes (Signed)
NUTRITION ASSESSMENT  Pt identified as at risk on the Malnutrition Screen Tool  INTERVENTION: 1. Supplements: Continue Ensure Enlive po BID, each supplement provides 350 kcal and 20 grams of protein  NUTRITION DIAGNOSIS: Unintentional weight loss related to sub-optimal intake as evidenced by pt report.   Goal: Pt to meet >/= 90% of their estimated nutrition needs.  Monitor:  PO intake  Assessment:  Pt admitted with depression, SI and ETOH abuse. Pt has been losing weight, 10 lb since 12/10 (8% wt loss x 2 months, significant for time frame). Ensure supplements have been ordered, will continue.  Height: Ht Readings from Last 1 Encounters:  09/10/18 5' 2.5" (1.588 m)    Weight: Wt Readings from Last 1 Encounters:  09/10/18 49 kg    Weight Hx: Wt Readings from Last 10 Encounters:  09/10/18 49 kg  07/11/18 53.5 kg  07/08/18 49.9 kg  01/24/18 52.2 kg  12/12/17 54.4 kg  07/28/17 54.4 kg  09/03/16 54.4 kg  08/11/16 67.1 kg  11/26/15 60.8 kg  11/18/15 58.1 kg    BMI:  Body mass index is 19.44 kg/m. Pt meets criteria for normal based on current BMI.  Estimated Nutritional Needs: Kcal: 25-30 kcal/kg Protein: > 1 gram protein/kg Fluid: 1 ml/kcal  Diet Order:  Diet Order            Diet Heart Room service appropriate? Yes; Fluid consistency: Thin  Diet effective now             Pt is also offered choice of unit snacks mid-morning and mid-afternoon.  Pt is eating as desired.   Lab results and medications reviewed.   Clayton Bibles, MS, RD, [place] Dietitian Pager: [phone] After Hours Pager: [phone]

## 2018-09-11 NOTE — BHH Suicide Risk Assessment (Signed)
[place] INPATIENT:  Family/Significant Other Suicide Prevention Education  Suicide Prevention Education:  Patient Refusal for Family/Significant Other Suicide Prevention Education: The patient Jaime Phillips has refused to provide written consent for family/significant other to be provided Family/Significant Other Suicide Prevention Education during admission and/or prior to discharge.  Physician notified.  SPE completed with pt, as pt refused to consent to family contact. SPI pamphlet provided to pt and pt was encouraged to share information with support network, ask questions, and talk about any concerns relating to SPE. Pt denies access to guns/firearms and verbalized understanding of information provided. Mobile Crisis information also provided to pt.   Avelina Laine LCSW 09/11/2018, 2:37 PM

## 2018-09-12 MED ORDER — BENZOCAINE 10 % MT GEL: Freq: Two times a day (BID) | OROMUCOSAL | 0 refills | Status: AC | PRN

## 2018-09-12 MED ORDER — METOPROLOL SUCCINATE ER 50 MG PO TB24: 50.0000 mg | ORAL_TABLET | Freq: Every day | ORAL | 0 refills | Status: AC

## 2018-09-12 MED ORDER — HYDROXYZINE HCL 25 MG PO TABS: 25.0000 mg | ORAL_TABLET | Freq: Four times a day (QID) | ORAL | 0 refills | Status: AC | PRN

## 2018-09-12 MED ORDER — NICOTINE 14 MG/24HR TD PT24: 14.0000 mg | MEDICATED_PATCH | Freq: Every day | TRANSDERMAL | 0 refills | Status: AC

## 2018-09-12 MED ORDER — GABAPENTIN 100 MG PO CAPS: 100.0000 mg | ORAL_CAPSULE | Freq: Three times a day (TID) | ORAL | 0 refills | Status: AC

## 2018-09-12 MED ORDER — ACETAMINOPHEN 325 MG PO TABS: 650.0000 mg | ORAL_TABLET | Freq: Four times a day (QID) | ORAL | 0 refills | Status: AC | PRN

## 2018-09-12 MED ORDER — FLUOXETINE HCL 20 MG PO CAPS: 20.0000 mg | ORAL_CAPSULE | Freq: Every day | ORAL | 0 refills | Status: AC

## 2018-09-12 NOTE — Progress Notes (Addendum)
Patient ID: Jaime Phillips, female   DOB: 09/19/1960, 58 y.o.   MRN: [id number]  Discharge Note  D) Patient discharged to lobby. Patient states readiness for discharge. Patient denies SI/HI, AVH and is not delusional or psychotic.   A) Written and verbal discharge instructions given to the patient. Patient accepting to information and verbalized understanding. Patient agrees to the discharge plan. Opportunity for questions and concerns presented to patient. Patient denied any further questions or concerns. All belongings returned to patient. Patient signed for return of belongings and discharge paperwork. Patient refused to complete Suicide Safety Plan but was provided Suicide Prevention Education. Patient provided an opportunity to complete and return Patient Satisfaction Survey.   R) Patient safely escorted to the lobby. Patient discharged from [facility] with prescriptions, personal belongings, bus pass, letter from SW, follow-up appointment in place and discharge instructions.

## 2018-09-12 NOTE — BHH Suicide Risk Assessment (Signed)
[facility] Discharge Suicide Risk Assessment   Principal Problem: Ms. Seward was admitted due to a cluster of dangerous behaviors in the context of an alcohol intoxication/blackout episode Discharge Diagnoses: Active Problems:   Major depressive disorder, recurrent severe without psychotic features ([place])   Total Time spent with patient: 45 minutes  ADL's:  Intact   Currently is alert oriented cooperative affect appropriate for the situation denies wanting to harm self or others no cravings tremors or withdrawal no psychosis  Mental Status Per Nursing Assessment::   On Admission:  NA  Demographic Factors:  NA  Loss Factors: NA  Historical Factors: NA  Risk Reduction Factors:   Religious beliefs about death  Continued Clinical Symptoms:  Alcohol/Substance Abuse/Dependencies  Cognitive Features That Contribute To Risk:  None    Suicide Risk:  Minimal: No identifiable suicidal ideation.  Patients presenting with no risk factors but with morbid ruminations; may be classified as minimal risk based on the severity of the depressive symptoms  Follow-up Information    Migdalia Dk F, NP Follow up on 09/13/2018.   Specialty:  Family Medicine Why:  Hospital follow up appointment is Wednesday, 2/12 at 2:00p. Please bring your current medications and discharge paperwork from this hospitalization,. Contact information: [place] [address] [phone]           Plan Of Care/Follow-up recommendations:  Activity:  nl  Johnn Hai, MD 09/12/2018, 8:58 AM

## 2018-09-12 NOTE — Discharge Summary (Signed)
Physician Discharge Summary Note  Patient:  Jaime Phillips is an 58 y.o., female MRN:  [id number] DOB:  1961/02/07 Patient phone:  [phone] (home)  Patient address:   [address] [place] [address],  Total Time spent with patient: 15 minutes  Date of Admission:  09/10/2018 Date of Discharge: 09/12/2018  Reason for Admission:  Suicidal statements  Principal Problem: Alcohol use disorder, severe, dependence ([place]) Discharge Diagnoses: Principal Problem:   Alcohol use disorder, severe, dependence ([place]) Active Problems:   Major depressive disorder, recurrent severe without psychotic features [place])   Past Psychiatric History: Per admission H&P: History of a prior psychiatric admission in April 2017, here at [facility]. At that time was admitted due to alcohol dependence, alcohol intoxication, suicidal ideations, auditory hallucinations.  Was diagnosed with alcohol dependence, alcohol induced mood disorder, discharged on trazodone. Currently patient describes a history of depression, which she attributes to loss of loved ones and homelessness.  Does not endorse history of mania or hypomania.  Denies history of suicide attempts.  Denies history of psychosis.  Past Medical History:  Past Medical History:  Diagnosis Date  . Abnormal uterine bleeding   . Alcoholism ([place])   . Arthritis   . Cholelithiasis   . Chronic pain syndrome   . Depression   . Diverticulosis   . Headache(784.0)   . Hiatal hernia   . Hyperplastic colon polyp   . Hypopotassemia   . Internal hemorrhoids   . LFT elevation   . Macrocytic anemia   . Mallory-Weiss tear   . Potassium (K) deficiency   . PUD (peptic ulcer disease)   . Rectal bleeding    minor     Past Surgical History:  Procedure Laterality Date  . APPENDECTOMY    . COLONOSCOPY    . ESOPHAGOGASTRODUODENOSCOPY N/A 02/06/2014   Procedure: ESOPHAGOGASTRODUODENOSCOPY (EGD);  Surgeon: Milus Banister, MD;  Location: [facility];  Service: Endoscopy;   Laterality: N/A;  . OOPHORECTOMY    . ORIF HUMERUS FRACTURE Right 08/10/2016   Procedure: OPEN REDUCTION INTERNAL FIXATION (ORIF) RIGHT PROXIMAL HUMERUS FRACTURE;  Surgeon: Renette Butters, MD;  Location: [facility];  Service: Orthopedics;  Laterality: Right;  . ORIF PERIPROSTHETIC FRACTURE Right 05/28/2013   Procedure: OPEN REDUCTION INTERNAL FIXATION (ORIF) PERIPROSTHETIC FRACTURE;  Surgeon: Mauri Pole, MD;  Location: [facility];  Service: Orthopedics;  Laterality: Right;  . SHOULDER SURGERY Left   . TOTAL HIP ARTHROPLASTY Right 04/10/2013   Procedure: RIGHT TOTAL HIP ARTHROPLASTY ANTERIOR APPROACH;  Surgeon: Mauri Pole, MD;  Location: [facility];  Service: Orthopedics;  Laterality: Right;  . TOTAL HIP ARTHROPLASTY     left hip   Family History:  Family History  Problem Relation Age of Onset  . Breast cancer Sister   . Diabetes Mother   . Kidney disease Mother   . Heart disease Father   . Colon cancer Father        questionable   Family Psychiatric  History: Per admission H&P: Denies history of mental illness and family, denies history of suicides or of alcohol abuse in family. Social History:  Social History   Substance and Sexual Activity  Alcohol Use Yes  . Alcohol/week: 2.0 - 3.0 standard drinks  . Types: 2 - 3 Shots of liquor per week   Comment: pt quit drinking 2 months ago (today is 03/11/15)     Social History   Substance and Sexual Activity  Drug Use No    Social History   Socioeconomic History  .  Marital status: Single    Spouse name: Not on file  . Number of children: 1  . Years of education: Not on file  . Highest education level: Not on file  Occupational History  . Occupation: disabled  Social Needs  . Financial resource strain: Not on file  . Food insecurity:    Worry: Not on file    Inability: Not on file  . Transportation needs:    Medical: Not on file    Non-medical: Not on file  Tobacco Use  . Smoking status: Current Every Day Smoker    Packs/day:  0.50    Years: 25.00    Pack years: 12.50    Types: Cigarettes  . Smokeless tobacco: Never Used  . Tobacco comment: Tobacco info given to pt. 08/16/12  Substance and Sexual Activity  . Alcohol use: Yes    Alcohol/week: 2.0 - 3.0 standard drinks    Types: 2 - 3 Shots of liquor per week    Comment: pt quit drinking 2 months ago (today is 03/11/15)  . Drug use: No  . Sexual activity: Not Currently  Lifestyle  . Physical activity:    Days per week: Not on file    Minutes per session: Not on file  . Stress: Not on file  Relationships  . Social connections:    Talks on phone: Not on file    Gets together: Not on file    Attends religious service: Not on file    Active member of club or organization: Not on file    Attends meetings of clubs or organizations: Not on file    Relationship status: Not on file  Other Topics Concern  . Not on file  Social History Narrative  . Not on file    Hospital Course:  Per admission H&P 09/10/2018: 58 year old female.  Presented ( IVC)to ED via [facility] on 2/8 after reportedly trying to jump from a moving vehicle,  making suicidal statements/saying she did not want to live anymore. Chart notes include collateral information from patient's son: report disorganized/dangerous behaviors:  trying to jump out of a moving car, becoming combative, dancing in the middle of the street, making suicidal statements of jumping in front of a train. Patient has a history of alcohol dependence and admission BAL 339.  UDS negative. At this time patient denies/minimizes above.  She states she does not drink daily, , does acknowledge drinking yesterday, but states she had not consumed alcohol for several weeks before then.  States "I guess I had about 3 of those airplane bottles". She acknowledges depression, which she states is chronic, and which she attributes to psychosocial stressors, mainly homelessness, as well as loss of loved ones.  Her husband passed away 3 years ago, her sister  died last year.  Endorses some neurovegetative symptoms as below, but at this time denies suicidal ideations, and denies having made suicidal gestures or statements prior to admission.  Ms. Cartelli was admitted for suicidal statements and combativeness in the context of alcohol intoxication. She was brought to ED via [facility] under IVC. She denied SI on admission to behavioral health unit. Prozac was prescribed, but patient refused any psychotropic medication. She did not have any withdrawal symptoms. She reported recent alcohol binges but denied daily ETOH use. Pt is returning to [facility] homeless shelter on discharge. Patient remained on [facility] unit for 2 days. She was discharged on the medications listed below. Patient has shown improvement with improved mood, affect, sleep, appetite, and  interaction. She denies any SI/HI/AVH and contracts for safety. She agrees to follow up with Migdalia Dk, NP (see below). Patient is provided with prescriptions for medications upon discharge.  Physical Findings: AIMS: Facial and Oral Movements Muscles of Facial Expression: None, normal Lips and Perioral Area: None, normal Jaw: None, normal Tongue: None, normal,Extremity Movements Upper (arms, wrists, hands, fingers): None, normal Lower (legs, knees, ankles, toes): None, normal, Trunk Movements Neck, shoulders, hips: None, normal, Overall Severity Severity of abnormal movements (highest score from questions above): None, normal Incapacitation due to abnormal movements: None, normal Patient's awareness of abnormal movements (rate only patient's report): No Awareness, Dental Status Current problems with teeth and/or dentures?: No Does patient usually wear dentures?: No  CIWA:  CIWA-Ar Total: 1 COWS:     Musculoskeletal: Strength & Muscle Tone: within normal limits Gait & Station: normal Patient leans: N/A  Psychiatric Specialty Exam: Physical Exam  Nursing note and vitals reviewed. Constitutional: She is  oriented to person, place, and time. She appears well-developed and well-nourished.  Cardiovascular: Normal rate.  Respiratory: Effort normal.  Neurological: She is alert and oriented to person, place, and time.    Review of Systems  Constitutional: Negative.   Psychiatric/Behavioral: Positive for substance abuse (ETOH). Negative for depression, hallucinations, memory loss and suicidal ideas. The patient is not nervous/anxious and does not have insomnia.     Blood pressure 125/82, pulse 92, temperature 98.6 F (37 C), temperature source Oral, resp. rate 18, height 5' 2.5" (1.588 m), weight 49 kg.Body mass index is 19.44 kg/m.  General Appearance: Casual  Eye Contact:  Fair  Speech:  Normal Rate  Volume:  Normal  Mood:  Euthymic  Affect:  Congruent  Thought Process:  Coherent  Orientation:  Full (Time, Place, and Person)  Thought Content:  WDL  Suicidal Thoughts:  No  Homicidal Thoughts:  No  Memory:  Immediate;   Fair  Judgement:  Fair  Insight:  Lacking  Psychomotor Activity:  Normal  Concentration:  Concentration: Fair  Recall:  [employer] of Knowledge:  Fair  Language:  Fair  Akathisia:  No  Handed:  Right  AIMS (if indicated):     Assets:  Communication Skills Leisure Time Resilience  ADL's:  Intact  Cognition:  WNL  Sleep:  Number of Hours: 4.5     Have you used any form of tobacco in the last 30 days? (Cigarettes, Smokeless Tobacco, Cigars, and/or Pipes): Yes  Has this patient used any form of tobacco in the last 30 days? (Cigarettes, Smokeless Tobacco, Cigars, and/or Pipes) Yes, a prescription for an FDA-approved medication for tobacco cessation was offered at discharge.  Blood Alcohol level:  Lab Results  Component Value Date   ETH 339 (HH) 09/09/2018   ETH 273 (H) 37/16/9678    Metabolic Disorder Labs:  Lab Results  Component Value Date   HGBA1C 5.6 09/11/2018   MPG 114.02 09/11/2018   MPG 100 11/07/2015   No results found for: PROLACTIN Lab  Results  Component Value Date   CHOL 210 (H) 09/11/2018   TRIG 122 09/11/2018   HDL 88 09/11/2018   CHOLHDL 2.4 09/11/2018   VLDL 24 09/11/2018   LDLCALC 98 09/11/2018   LDLCALC 108 (H) 01/17/2014    See Psychiatric Specialty Exam and Suicide Risk Assessment completed by Attending Physician prior to discharge.  Discharge destination:  Home  Is patient on multiple antipsychotic therapies at discharge:  No   Has Patient had three or more failed trials of  antipsychotic monotherapy by history:  No  Recommended Plan for Multiple Antipsychotic Therapies: NA  Discharge Instructions    Discharge instructions   Complete by:  As directed    Patient is instructed to take all prescribed medications as recommended. Report any side effects or adverse reactions to your outpatient psychiatrist. Patient is instructed to abstain from alcohol and illegal drugs while on prescription medications. In the event of worsening symptoms, patient is instructed to call the crisis hotline, 911, or go to the nearest emergency department for evaluation and treatment.     Allergies as of 09/12/2018   No Known Allergies     Medication List    TAKE these medications     Indication  acetaminophen 325 MG tablet Commonly known as:  TYLENOL Take 2 tablets (650 mg total) by mouth every 6 (six) hours as needed for mild pain. (May buy over the counter) What changed:    medication strength  how much to take  additional instructions  Indication:  Pain   aspirin 325 MG tablet Take 325 mg by mouth daily.  Indication:  Antiplatelet   benzocaine 10 % mucosal gel Commonly known as:  ORAJEL Use as directed in the mouth or throat 2 (two) times daily as needed for mouth pain. (May buy over the counter)  Indication:  Gum pain   FLOVENT HFA 110 MCG/ACT inhaler Generic drug:  fluticasone Inhale 1 puff into the lungs 2 (two) times daily.  Indication:  Shortness of breath   FLUoxetine 20 MG capsule Commonly  known as:  PROZAC Take 1 capsule (20 mg total) by mouth daily. For mood Start taking on:  September 13, 2018  Indication:  Mood   gabapentin 100 MG capsule Commonly known as:  NEURONTIN Take 1 capsule (100 mg total) by mouth 3 (three) times daily. For alcohol withdrawal  Indication:  Abuse or Misuse of Alcohol, Alcohol Withdrawal Syndrome   hydrOXYzine 25 MG tablet Commonly known as:  ATARAX/VISTARIL Take 1 tablet (25 mg total) by mouth every 6 (six) hours as needed (anxiety/agitation or CIWA < or = 10).  Indication:  Feeling Anxious   ibuprofen 200 MG tablet Commonly known as:  ADVIL,MOTRIN Take 600 mg by mouth every 6 (six) hours as needed for headache or mild pain.  Indication:  Mild to Moderate Pain   metoprolol succinate 50 MG 24 hr tablet Commonly known as:  TOPROL-XL Take 1 tablet (50 mg total) by mouth daily. For high blood pressure. Take with or immediately following a meal. Start taking on:  September 13, 2018  Indication:  High Blood Pressure Disorder   MULTIVITAMIN WOMEN 50+ Tabs Take 1 tablet by mouth daily.  Indication:  Supplementation   nicotine 14 mg/24hr patch Commonly known as:  NICODERM CQ - dosed in mg/24 hours Place 1 patch (14 mg total) onto the skin daily. For smoking cessation Start taking on:  September 13, 2018  Indication:  Nicotine Addiction   pantoprazole 20 MG tablet Commonly known as:  PROTONIX Take 1 tablet (20 mg total) by mouth daily.  Indication:  Gastroesophageal Reflux Disease   Vitamin D (Ergocalciferol) 1.25 MG (50000 UT) Caps capsule Commonly known as:  DRISDOL Take 50,000 Units by mouth every 7 (seven) days.  Indication:  Supplementation   vitamin E 400 UNIT capsule Generic drug:  vitamin E Take 400 Units by mouth daily.  Indication:  Supplementation      Follow-up Information    Ermalene Postin, NP Follow up on 09/13/2018.  Specialty:  Family Medicine Why:  Hospital follow up appointment is Wednesday, 2/12 at 2:00p. Please  bring your current medications and discharge paperwork from this hospitalization,. Contact information: [place] [address] [phone]           Follow-up recommendations: Activity as tolerated. Diet as recommended by primary care physician. Keep all scheduled follow-up appointments as recommended.   Comments:   Patient is instructed to take all prescribed medications as recommended. Report any side effects or adverse reactions to your outpatient psychiatrist. Patient is instructed to abstain from alcohol and illegal drugs while on prescription medications. In the event of worsening symptoms, patient is instructed to call the crisis hotline, 911, or go to the nearest emergency department for evaluation and treatment.  Signed: Connye Burkitt, NP 09/12/2018, 1:45 PM

## 2018-09-12 NOTE — Progress Notes (Signed)
  [facility] Adult Case Management Discharge Plan :  Will you be returning to the same living situation after discharge:  Yes,  [facility] homeless shelter At discharge, do you have transportation home?: Yes,  taxi voucher provided Do you have the ability to pay for your medications: Yes,  medicaid  Release of information consent forms completed and submitted to medical records by CSW.  Patient to Follow up at: Follow-up Information    Ermalene Postin, NP Follow up on 09/13/2018.   Specialty:  Family Medicine Why:  Hospital follow up appointment is Wednesday, 2/12 at 2:00p. Please bring your current medications and discharge paperwork from this hospitalization,. Contact information: [place] [address] [phone]           Next level of care provider has access to [place] and Suicide Prevention discussed: Yes,  SPE completed with pt; pt declined to consent to collateral contact.   Have you used any form of tobacco in the last 30 days? (Cigarettes, Smokeless Tobacco, Cigars, and/or Pipes): Yes  Has patient been referred to the Quitline?: Patient refused referral  Patient has been referred for addiction treatment: Yes  Avelina Laine, LCSW 09/12/2018, 9:04 AM

## 2018-09-12 NOTE — Progress Notes (Signed)
Patient ID: Jaime Phillips, female   DOB: 03/01/1961, 58 y.o.   MRN: [id number]  Nursing Progress Note [phone]  Data: On initial approach, patient is seen up in the milieu. Patient presents with euthymic mood. Patient compliant with scheduled medications. Patient denies pain/physical complaints. Patient completed self-inventory sheet and rates depression, hopelessness, and anxiety 6,6,7 respectively. Patient rates their sleep and appetite as fair/good respectively. Patient states goal for today is to "go to the doctor". Patient is seen resting in her room. Patient currently denies SI/HI/AVH.   Action: Patient is educated about and provided medication per provider's orders. Patient safety maintained with q15 min safety checks and frequent rounding. High fall risk precautions in place. Emotional support given. 1:1 interaction and active listening provided. Patient encouraged to attend meals, groups, and work on treatment plan and goals. Labs, vital signs and patient behavior monitored throughout shift.   Response: Patient remains safe on the unit at this time and agrees to come to staff with any issues/concerns. Patient is interacting with peers appropriately on the unit. Will continue to support and monitor.

## 2018-10-04 ENCOUNTER — Emergency Department (HOSPITAL_COMMUNITY)
Admission: EM | Admit: 2018-10-04 | Discharge: 2018-10-05 | Disposition: A | Discharge status: Home / Self Care | Payer: Medicaid Other | Attending: Emergency Medicine | Admitting: Emergency Medicine

## 2018-10-04 ENCOUNTER — Encounter (HOSPITAL_COMMUNITY): Payer: Self-pay | Admitting: Emergency Medicine

## 2018-10-04 ENCOUNTER — Other Ambulatory Visit: Payer: Self-pay

## 2018-10-04 DIAGNOSIS — Z79899 Other long term (current) drug therapy: Secondary | ICD-10-CM | POA: Diagnosis not present

## 2018-10-04 DIAGNOSIS — Z7982 Long term (current) use of aspirin: Secondary | ICD-10-CM | POA: Insufficient documentation

## 2018-10-04 DIAGNOSIS — F17210 Nicotine dependence, cigarettes, uncomplicated: Secondary | ICD-10-CM | POA: Diagnosis not present

## 2018-10-04 DIAGNOSIS — F10920 Alcohol use, unspecified with intoxication, uncomplicated: Secondary | ICD-10-CM

## 2018-10-04 DIAGNOSIS — F10220 Alcohol dependence with intoxication, uncomplicated: Secondary | ICD-10-CM | POA: Diagnosis present

## 2018-10-04 DIAGNOSIS — I10 Essential (primary) hypertension: Secondary | ICD-10-CM | POA: Diagnosis not present

## 2018-10-04 LAB — COMPREHENSIVE METABOLIC PANEL
ALT: 17 U/L (ref 0–44)
AST: 30 U/L (ref 15–41)
Albumin: 3.6 g/dL (ref 3.5–5.0)
Alkaline Phosphatase: 60 U/L (ref 38–126)
Anion gap: 10 (ref 5–15)
BUN: 9 mg/dL (ref 6–20)
CO2: 27 mmol/L (ref 22–32)
CREATININE: 0.63 mg/dL (ref 0.44–1.00)
Calcium: 8.8 mg/dL — ABNORMAL LOW (ref 8.9–10.3)
Chloride: 110 mmol/L (ref 98–111)
GFR calc non Af Amer: >60 mL/min (ref >60)
Glucose, Bld: 96 mg/dL (ref 70–99)
Potassium: 3.6 mmol/L (ref 3.5–5.1)
Sodium: 147 mmol/L — ABNORMAL HIGH (ref 135–145)
Total Bilirubin: 0.3 mg/dL (ref 0.3–1.2)
Total Protein: 6.4 g/dL — ABNORMAL LOW (ref 6.5–8.1)

## 2018-10-04 LAB — CBC
HCT: 38.5 % (ref 36.0–46.0)
Hemoglobin: 12.4 g/dL (ref 12.0–15.0)
MCH: 31.7 pg (ref 26.0–34.0)
MCHC: 32.2 g/dL (ref 30.0–36.0)
MCV: 98.5 fL (ref 80.0–100.0)
Platelets: 271 10*3/uL (ref 150–400)
RBC: 3.91 MIL/uL (ref 3.87–5.11)
RDW: 16.2 % — ABNORMAL HIGH (ref 11.5–15.5)
WBC: 8.4 10*3/uL (ref 4.0–10.5)
nRBC: 0 % (ref 0.0–0.2)

## 2018-10-04 LAB — RAPID URINE DRUG SCREEN, HOSP PERFORMED
Amphetamines: NOT DETECTED
Barbiturates: NOT DETECTED
Benzodiazepines: NOT DETECTED
COCAINE: NOT DETECTED
Opiates: NOT DETECTED
Tetrahydrocannabinol: NOT DETECTED

## 2018-10-04 LAB — ETHANOL: Alcohol, Ethyl (B): 343 mg/dL (ref <10)

## 2018-10-04 NOTE — ED Triage Notes (Signed)
Pt BIB [facility] for eval of ETOH intox. Pt was reportedly stumbling around outside an apartment complex, onlookers became concerned and called 911. Pt was not cooperative w/ EMS, remains uncooperative, but is not combative. Pt w/ slurred speech, no obvious traumatic injuries.

## 2018-10-04 NOTE — ED Provider Notes (Signed)
[facility] EMERGENCY DEPARTMENT Provider Note   CSN: [id number] Arrival date & time: 10/04/18  [place]    History   Chief Complaint Chief Complaint  Patient presents with  . Alcohol Intoxication    HPI Jaime Phillips is a 58 y.o. female.     HPI  58 year old female who presents via EMS with reports that she is intoxicated.  She was found at a apartment complex and appeared intoxicated to neighbors who called EMS.  EMS reports that she had been drinking today.  She has no other complaints here.  Past Medical History:  Diagnosis Date  . Abnormal uterine bleeding   . Alcoholism ([place])   . Arthritis   . Cholelithiasis   . Chronic pain syndrome   . Depression   . Diverticulosis   . Headache(784.0)   . Hiatal hernia   . Hyperplastic colon polyp   . Hypopotassemia   . Internal hemorrhoids   . LFT elevation   . Macrocytic anemia   . Mallory-Weiss tear   . Potassium (K) deficiency   . PUD (peptic ulcer disease)   . Rectal bleeding    minor     Patient Active Problem List   Diagnosis Date Noted  . Major depressive disorder, recurrent severe without psychotic features ([place]) 09/10/2018  . RUQ abdominal pain 01/25/2018  . Humeral head fracture, right, closed, initial encounter 08/08/2016  . Hyponatremia 08/08/2016  . Acute URI 08/08/2016  . Closed fracture of right proximal humerus   . Cause of injury, fall   . Chest pain 08/07/2016  . Substance induced mood disorder ([place]) 11/03/2015  . ETOH abuse   . Alcohol use disorder, severe, dependence ([place]) 10/03/2015  . Major psychotic depression, recurrent ([place]) 10/03/2015  . Ankle edema 03/17/2015  . Cirrhosis ([place]) 02/24/2015  . Black stools 02/24/2015  . Abdominal pain, epigastric 01/29/2015  . Nausea with vomiting 01/29/2015  . 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 Dysphagia 01/29/2015  . Hypokalemia 12/19/2014  . Hypomagnesemia 12/19/2014  . Malnutrition of moderate degree ([place]) 12/18/2014  .  Alcoholic ketoacidosis 74/82/7078  . Alcohol abuse, in remission 02/14/2014  . Elevated LFTs 02/14/2014  . Essential hypertension, benign 02/14/2014  . Smoking 02/14/2014  . Upper GI bleed 02/06/2014  . Chronic pain syndrome 02/06/2014  . Gastro-esophageal reflux 02/06/2014  . Dental caries 01/17/2014  . Periprosthetic fracture of hip 05/31/2013  . Hip fracture ([place]) 05/27/2013  . Normocytic anemia 04/11/2013  . S/P right THA, AA 04/10/2013    Past Surgical History:  Procedure Laterality Date  . APPENDECTOMY    . COLONOSCOPY    . ESOPHAGOGASTRODUODENOSCOPY N/A 02/06/2014   Procedure: ESOPHAGOGASTRODUODENOSCOPY (EGD);  Surgeon: Milus Banister, MD;  Location: [facility];  Service: Endoscopy;  Laterality: N/A;  . OOPHORECTOMY    . ORIF HUMERUS FRACTURE Right 08/10/2016   Procedure: OPEN REDUCTION INTERNAL FIXATION (ORIF) RIGHT PROXIMAL HUMERUS FRACTURE;  Surgeon: Renette Butters, MD;  Location: [facility];  Service: Orthopedics;  Laterality: Right;  . ORIF PERIPROSTHETIC FRACTURE Right 05/28/2013   Procedure: OPEN REDUCTION INTERNAL FIXATION (ORIF) PERIPROSTHETIC FRACTURE;  Surgeon: Mauri Pole, MD;  Location: [facility];  Service: Orthopedics;  Laterality: Right;  . SHOULDER SURGERY Left   . TOTAL HIP ARTHROPLASTY Right 04/10/2013   Procedure: RIGHT TOTAL HIP ARTHROPLASTY ANTERIOR APPROACH;  Surgeon: Mauri Pole, MD;  Location: [facility];  Service: Orthopedics;  Laterality: Right;  . TOTAL HIP ARTHROPLASTY     left hip     OB History  Gravida  2   Para  1   Term  1   Preterm      AB  1   Living  1     SAB  1   TAB      Ectopic      Multiple      Live Births               Home Medications    Prior to Admission medications   Medication Sig Start Date End Date Taking? Authorizing Provider  acetaminophen (TYLENOL) 325 MG tablet Take 2 tablets (650 mg total) by mouth every 6 (six) hours as needed for mild pain. (May buy over the counter) 09/12/18   Connye Burkitt,  NP  aspirin 325 MG tablet Take 325 mg by mouth daily.    [provider]  benzocaine (ORAJEL) 10 % mucosal gel Use as directed in the mouth or throat 2 (two) times daily as needed for mouth pain. (May buy over the counter) 09/12/18   Connye Burkitt, NP  FLOVENT HFA 110 MCG/ACT inhaler Inhale 1 puff into the lungs 2 (two) times daily. 09/01/18   [provider]  FLUoxetine (PROZAC) 20 MG capsule Take 1 capsule (20 mg total) by mouth daily. For mood 09/13/18   Connye Burkitt, NP  gabapentin (NEURONTIN) 100 MG capsule Take 1 capsule (100 mg total) by mouth 3 (three) times daily. For alcohol withdrawal 09/12/18   Connye Burkitt, NP  hydrOXYzine (ATARAX/VISTARIL) 25 MG tablet Take 1 tablet (25 mg total) by mouth every 6 (six) hours as needed (anxiety/agitation or CIWA < or = 10). 09/12/18   Connye Burkitt, NP  ibuprofen (ADVIL,MOTRIN) 200 MG tablet Take 600 mg by mouth every 6 (six) hours as needed for headache or mild pain.     [provider]  metoprolol succinate (TOPROL-XL) 50 MG 24 hr tablet Take 1 tablet (50 mg total) by mouth daily. For high blood pressure. Take with or immediately following a meal. 09/13/18   Connye Burkitt, NP  Multiple Vitamins-Minerals (MULTIVITAMIN WOMEN 50+) TABS Take 1 tablet by mouth daily.    [provider]  nicotine (NICODERM CQ - DOSED IN MG/24 HOURS) 14 mg/24hr patch Place 1 patch (14 mg total) onto the skin daily. For smoking cessation 09/13/18   Connye Burkitt, NP  pantoprazole (PROTONIX) 20 MG tablet Take 1 tablet (20 mg total) by mouth daily. 03/01/18   Zehr, Laban Emperor, PA-C  Vitamin D, Ergocalciferol, (DRISDOL) 1.25 MG (50000 UT) CAPS capsule Take 50,000 Units by mouth every 7 (seven) days.  09/01/18   [provider]  vitamin E (VITAMIN E) 400 UNIT capsule Take 400 Units by mouth daily.    [provider]    Family History Family History  Problem Relation Age of Onset  . Breast cancer Sister   . Diabetes Mother     . Kidney disease Mother   . Heart disease Father   . Colon cancer Father        questionable    Social History Social History   Tobacco Use  . Smoking status: Current Every Day Smoker    Packs/day: 0.50    Years: 25.00    Pack years: 12.50    Types: Cigarettes  . Smokeless tobacco: Never Used  . Tobacco comment: Tobacco info given to pt. 08/16/12  Substance Use Topics  . Alcohol use: Yes    Alcohol/week: 2.0 - 3.0 standard drinks  Types: 2 - 3 Shots of liquor per week    Comment: pt quit drinking 2 months ago (today is 03/11/15)  . Drug use: No     Allergies   Patient has no known allergies.   Review of Systems Review of Systems  All other systems reviewed and are negative.    Physical Exam Updated Vital Signs BP 124/80 (BP Location: Right Arm)   Pulse 90   Temp 97.6 F (36.4 C) (Oral)   Resp 18   SpO2 96%   Physical Exam Vitals signs and nursing note reviewed.  HENT:     Head: Normocephalic.     Right Ear: External ear normal.     Left Ear: External ear normal.     Nose: Nose normal.     Mouth/Throat:     Mouth: Mucous membranes are moist.  Neck:     Musculoskeletal: Normal range of motion.  Cardiovascular:     Rate and Rhythm: Normal rate.     Pulses: Normal pulses.  Pulmonary:     Effort: Pulmonary effort is normal.  Abdominal:     General: Abdomen is flat. Bowel sounds are normal.  Musculoskeletal: Normal range of motion.  Skin:    General: Skin is warm and dry.     Capillary Refill: Capillary refill takes less than 2 seconds.  Neurological:     General: No focal deficit present.     Mental Status: She is alert.  Psychiatric:        Attention and Perception: She is inattentive.        Mood and Affect: Mood normal.        Speech: Speech normal.        Behavior: Behavior is agitated.        Thought Content: Thought content normal.      ED Treatments / Results  Labs (all labs ordered are listed, but only abnormal results are  displayed) Labs Reviewed  CBC - Abnormal; Notable for the following components:      Result Value   RDW 16.2 (*)    All other components within normal limits  COMPREHENSIVE METABOLIC PANEL - Abnormal; Notable for the following components:   Sodium 147 (*)    Calcium 8.8 (*)    Total Protein 6.4 (*)    All other components within normal limits  ETHANOL - Abnormal; Notable for the following components:   Alcohol, Ethyl (B) 343 (*)    All other components within normal limits  RAPID URINE DRUG SCREEN, HOSP PERFORMED    EKG None  Radiology No results found.  Procedures Procedures (including critical care time)  Medications Ordered in ED Medications - No data to display   Initial Impression / Assessment and Plan / ED Course  I have reviewed the triage vital signs and the nursing notes.  Pertinent labs & imaging results that were available during my care of the patient were reviewed by me and considered in my medical decision making (see chart for details).       Patient's evaluation here reveals that she feels to be hemodynamically stable.  Labs are essentially normal with the exception of her alcohol level being significantly elevated.  Here she appears clinically sober and stable for discharge f Final Clinical Impressions(s) / ED Diagnoses   Final diagnoses:  Alcoholic intoxication without complication [facility])    ED Discharge Orders    None       Pattricia Boss, MD 10/04/18 2304

## 2018-10-04 NOTE — ED Notes (Signed)
Pt refused to give blood, RN notified.

## 2018-10-04 NOTE — ED Notes (Signed)
Attempted to draw blood, no success.

## 2018-10-04 NOTE — ED Notes (Signed)
Patient given [place] sandwich and cheese and sprite.

## 2018-10-05 NOTE — ED Notes (Signed)
  Patient stated she was upset with being discharged this late at night and dissatisfied with her care.    Patient was locked in bathroom for 45 minutes getting dressed and putting on makeup before she would let us discharge her.

## 2022-09-02 DEATH — deceased
